# Patient Record
Sex: Female | Born: 1965 | Race: White | Hispanic: No | Marital: Married | Smoking: Never smoker
Health system: Southern US, Community
[De-identification: ages and names within clinical notes are randomized; demographics above are authoritative.]

## PROBLEM LIST (undated history)

## (undated) DIAGNOSIS — J349 Unspecified disorder of nose and nasal sinuses: Secondary | ICD-10-CM

## (undated) DIAGNOSIS — T4145XA Adverse effect of unspecified anesthetic, initial encounter: Secondary | ICD-10-CM

## (undated) DIAGNOSIS — I770 Arteriovenous fistula, acquired: Secondary | ICD-10-CM

## (undated) DIAGNOSIS — I82409 Acute embolism and thrombosis of unspecified deep veins of unspecified lower extremity: Secondary | ICD-10-CM

## (undated) DIAGNOSIS — H269 Unspecified cataract: Secondary | ICD-10-CM

## (undated) DIAGNOSIS — E11621 Type 2 diabetes mellitus with foot ulcer: Secondary | ICD-10-CM

## (undated) DIAGNOSIS — N186 End stage renal disease: Secondary | ICD-10-CM

## (undated) DIAGNOSIS — L97509 Non-pressure chronic ulcer of other part of unspecified foot with unspecified severity: Secondary | ICD-10-CM

## (undated) DIAGNOSIS — T8859XA Other complications of anesthesia, initial encounter: Secondary | ICD-10-CM

## (undated) DIAGNOSIS — T300 Burn of unspecified body region, unspecified degree: Secondary | ICD-10-CM

## (undated) DIAGNOSIS — M869 Osteomyelitis, unspecified: Secondary | ICD-10-CM

## (undated) DIAGNOSIS — B49 Unspecified mycosis: Secondary | ICD-10-CM

## (undated) DIAGNOSIS — E209 Hypoparathyroidism, unspecified: Secondary | ICD-10-CM

## (undated) DIAGNOSIS — R269 Unspecified abnormalities of gait and mobility: Secondary | ICD-10-CM

## (undated) DIAGNOSIS — R2689 Other abnormalities of gait and mobility: Secondary | ICD-10-CM

## (undated) DIAGNOSIS — R911 Solitary pulmonary nodule: Secondary | ICD-10-CM

## (undated) DIAGNOSIS — G709 Myoneural disorder, unspecified: Secondary | ICD-10-CM

## (undated) DIAGNOSIS — S61259A Open bite of unspecified finger without damage to nail, initial encounter: Secondary | ICD-10-CM

## (undated) DIAGNOSIS — E875 Hyperkalemia: Secondary | ICD-10-CM

## (undated) DIAGNOSIS — I959 Hypotension, unspecified: Secondary | ICD-10-CM

## (undated) DIAGNOSIS — E236 Other disorders of pituitary gland: Secondary | ICD-10-CM

## (undated) DIAGNOSIS — T7840XA Allergy, unspecified, initial encounter: Secondary | ICD-10-CM

## (undated) DIAGNOSIS — Z86010 Personal history of colonic polyps: Secondary | ICD-10-CM

## (undated) DIAGNOSIS — G629 Polyneuropathy, unspecified: Secondary | ICD-10-CM

## (undated) DIAGNOSIS — K219 Gastro-esophageal reflux disease without esophagitis: Secondary | ICD-10-CM

## (undated) DIAGNOSIS — S68119A Complete traumatic metacarpophalangeal amputation of unspecified finger, initial encounter: Secondary | ICD-10-CM

## (undated) HISTORY — PX: KNEE ARTHROSCOPY: SUR90

## (undated) HISTORY — DX: Gastro-esophageal reflux disease without esophagitis: K21.9

## (undated) HISTORY — PX: ARTERIOVENOUS GRAFT PLACEMENT: SUR1029

## (undated) HISTORY — DX: Type 2 diabetes mellitus with foot ulcer: E11.621

## (undated) HISTORY — DX: End stage renal disease: N18.6

## (undated) HISTORY — PX: BREAST SURGERY: SHX581

## (undated) HISTORY — DX: Polyneuropathy, unspecified: G62.9

## (undated) HISTORY — PX: OTHER SURGICAL HISTORY: SHX169

## (undated) HISTORY — DX: Solitary pulmonary nodule: R91.1

## (undated) HISTORY — DX: Open bite of unspecified finger without damage to nail, initial encounter: S61.259A

## (undated) HISTORY — DX: Unspecified cataract: H26.9

## (undated) HISTORY — DX: Unspecified disorder of nose and nasal sinuses: J34.9

## (undated) HISTORY — DX: Myoneural disorder, unspecified: G70.9

## (undated) HISTORY — PX: CATARACT EXTRACTION: SUR2

## (undated) HISTORY — DX: Personal history of colonic polyps: Z86.010

## (undated) HISTORY — DX: Hypoparathyroidism, unspecified: E20.9

## (undated) HISTORY — DX: Hyperkalemia: E87.5

## (undated) HISTORY — DX: Non-pressure chronic ulcer of other part of unspecified foot with unspecified severity: L97.509

## (undated) HISTORY — PX: TOE AMPUTATION: SHX809

## (undated) HISTORY — PX: AV FISTULA PLACEMENT: SHX1204

## (undated) HISTORY — DX: Osteomyelitis, unspecified: M86.9

## (undated) HISTORY — DX: Hypotension, unspecified: I95.9

## (undated) HISTORY — DX: Unspecified mycosis: B49

## (undated) HISTORY — DX: Allergy, unspecified, initial encounter: T78.40XA

## (undated) HISTORY — DX: Complete traumatic metacarpophalangeal amputation of unspecified finger, initial encounter: S68.119A

---

## 2003-02-02 ENCOUNTER — Inpatient Hospital Stay (HOSPITAL_COMMUNITY): Admission: AD | Admit: 2003-02-02 | Discharge: 2003-02-09 | Payer: Self-pay | Admitting: Internal Medicine

## 2003-02-02 ENCOUNTER — Encounter: Payer: Self-pay | Admitting: Internal Medicine

## 2003-02-06 ENCOUNTER — Encounter: Payer: Self-pay | Admitting: Internal Medicine

## 2003-02-22 ENCOUNTER — Inpatient Hospital Stay (HOSPITAL_COMMUNITY): Admission: EM | Admit: 2003-02-22 | Discharge: 2003-03-08 | Payer: Self-pay | Admitting: Emergency Medicine

## 2003-02-22 ENCOUNTER — Encounter: Payer: Self-pay | Admitting: Internal Medicine

## 2003-02-23 ENCOUNTER — Encounter (INDEPENDENT_AMBULATORY_CARE_PROVIDER_SITE_OTHER): Payer: Self-pay | Admitting: Specialist

## 2003-02-25 ENCOUNTER — Encounter (INDEPENDENT_AMBULATORY_CARE_PROVIDER_SITE_OTHER): Payer: Self-pay | Admitting: *Deleted

## 2003-02-25 ENCOUNTER — Encounter: Payer: Self-pay | Admitting: Internal Medicine

## 2003-02-26 ENCOUNTER — Encounter (INDEPENDENT_AMBULATORY_CARE_PROVIDER_SITE_OTHER): Payer: Self-pay | Admitting: *Deleted

## 2003-03-02 ENCOUNTER — Encounter (INDEPENDENT_AMBULATORY_CARE_PROVIDER_SITE_OTHER): Payer: Self-pay | Admitting: Cardiology

## 2003-03-03 ENCOUNTER — Encounter: Payer: Self-pay | Admitting: Internal Medicine

## 2003-03-06 ENCOUNTER — Encounter: Payer: Self-pay | Admitting: Internal Medicine

## 2003-03-20 ENCOUNTER — Ambulatory Visit (HOSPITAL_BASED_OUTPATIENT_CLINIC_OR_DEPARTMENT_OTHER): Admission: RE | Admit: 2003-03-20 | Discharge: 2003-03-20 | Payer: Self-pay | Admitting: Orthopaedic Surgery

## 2003-03-23 ENCOUNTER — Encounter (HOSPITAL_COMMUNITY): Admission: RE | Admit: 2003-03-23 | Discharge: 2003-06-21 | Payer: Self-pay | Admitting: Nephrology

## 2003-04-08 ENCOUNTER — Encounter: Payer: Self-pay | Admitting: Emergency Medicine

## 2003-04-08 ENCOUNTER — Inpatient Hospital Stay (HOSPITAL_COMMUNITY): Admission: EM | Admit: 2003-04-08 | Discharge: 2003-04-18 | Payer: Self-pay | Admitting: Emergency Medicine

## 2003-05-18 ENCOUNTER — Inpatient Hospital Stay (HOSPITAL_COMMUNITY): Admission: AD | Admit: 2003-05-18 | Discharge: 2003-05-21 | Payer: Self-pay | Admitting: Nephrology

## 2003-06-07 ENCOUNTER — Encounter: Payer: Self-pay | Admitting: Nephrology

## 2003-06-07 ENCOUNTER — Ambulatory Visit (HOSPITAL_COMMUNITY): Admission: RE | Admit: 2003-06-07 | Discharge: 2003-06-07 | Payer: Self-pay | Admitting: Nephrology

## 2003-07-18 ENCOUNTER — Encounter (HOSPITAL_BASED_OUTPATIENT_CLINIC_OR_DEPARTMENT_OTHER): Admission: RE | Admit: 2003-07-18 | Discharge: 2003-10-16 | Payer: Self-pay | Admitting: Internal Medicine

## 2003-08-14 ENCOUNTER — Ambulatory Visit (HOSPITAL_COMMUNITY): Admission: RE | Admit: 2003-08-14 | Discharge: 2003-08-14 | Payer: Self-pay | Admitting: Vascular Surgery

## 2003-11-19 ENCOUNTER — Encounter (HOSPITAL_BASED_OUTPATIENT_CLINIC_OR_DEPARTMENT_OTHER): Admission: RE | Admit: 2003-11-19 | Discharge: 2004-02-17 | Payer: Self-pay | Admitting: Internal Medicine

## 2004-01-03 ENCOUNTER — Other Ambulatory Visit: Admission: RE | Admit: 2004-01-03 | Discharge: 2004-01-03 | Payer: Self-pay | Admitting: Obstetrics and Gynecology

## 2004-02-28 ENCOUNTER — Encounter (INDEPENDENT_AMBULATORY_CARE_PROVIDER_SITE_OTHER): Payer: Self-pay | Admitting: Specialist

## 2004-02-28 ENCOUNTER — Ambulatory Visit (HOSPITAL_COMMUNITY): Admission: RE | Admit: 2004-02-28 | Discharge: 2004-02-28 | Payer: Self-pay | Admitting: Obstetrics and Gynecology

## 2004-03-04 ENCOUNTER — Encounter (HOSPITAL_BASED_OUTPATIENT_CLINIC_OR_DEPARTMENT_OTHER): Admission: RE | Admit: 2004-03-04 | Discharge: 2004-06-02 | Payer: Self-pay | Admitting: Internal Medicine

## 2004-05-12 ENCOUNTER — Inpatient Hospital Stay (HOSPITAL_COMMUNITY): Admission: AD | Admit: 2004-05-12 | Discharge: 2004-05-12 | Payer: Self-pay | Admitting: Family Medicine

## 2004-05-20 ENCOUNTER — Encounter (HOSPITAL_COMMUNITY): Admission: RE | Admit: 2004-05-20 | Discharge: 2004-05-30 | Payer: Self-pay | Admitting: Obstetrics and Gynecology

## 2004-05-20 ENCOUNTER — Inpatient Hospital Stay (HOSPITAL_COMMUNITY): Admission: AD | Admit: 2004-05-20 | Discharge: 2004-05-23 | Payer: Self-pay | Admitting: Internal Medicine

## 2004-06-11 ENCOUNTER — Encounter (HOSPITAL_BASED_OUTPATIENT_CLINIC_OR_DEPARTMENT_OTHER): Admission: RE | Admit: 2004-06-11 | Discharge: 2004-09-05 | Payer: Self-pay | Admitting: Internal Medicine

## 2004-08-17 ENCOUNTER — Emergency Department: Payer: Self-pay | Admitting: Emergency Medicine

## 2004-09-01 ENCOUNTER — Ambulatory Visit: Payer: Self-pay | Admitting: Family Medicine

## 2004-09-12 ENCOUNTER — Ambulatory Visit: Payer: Self-pay | Admitting: Infectious Diseases

## 2004-09-12 ENCOUNTER — Inpatient Hospital Stay (HOSPITAL_COMMUNITY): Admission: EM | Admit: 2004-09-12 | Discharge: 2004-09-20 | Payer: Self-pay | Admitting: Emergency Medicine

## 2004-09-14 ENCOUNTER — Encounter (INDEPENDENT_AMBULATORY_CARE_PROVIDER_SITE_OTHER): Payer: Self-pay | Admitting: *Deleted

## 2004-09-23 ENCOUNTER — Encounter (HOSPITAL_BASED_OUTPATIENT_CLINIC_OR_DEPARTMENT_OTHER): Admission: RE | Admit: 2004-09-23 | Discharge: 2004-11-21 | Payer: Self-pay | Admitting: Internal Medicine

## 2004-09-29 ENCOUNTER — Ambulatory Visit (HOSPITAL_COMMUNITY): Admission: RE | Admit: 2004-09-29 | Discharge: 2004-09-29 | Payer: Self-pay | Admitting: Nephrology

## 2004-10-09 ENCOUNTER — Ambulatory Visit (HOSPITAL_COMMUNITY): Admission: RE | Admit: 2004-10-09 | Discharge: 2004-10-09 | Payer: Self-pay | Admitting: Nephrology

## 2004-10-18 ENCOUNTER — Encounter (INDEPENDENT_AMBULATORY_CARE_PROVIDER_SITE_OTHER): Payer: Self-pay | Admitting: *Deleted

## 2004-10-18 ENCOUNTER — Inpatient Hospital Stay (HOSPITAL_COMMUNITY): Admission: AD | Admit: 2004-10-18 | Discharge: 2004-10-24 | Payer: Self-pay | Admitting: General Surgery

## 2004-11-18 ENCOUNTER — Ambulatory Visit: Payer: Self-pay

## 2004-12-20 ENCOUNTER — Emergency Department (HOSPITAL_COMMUNITY): Admission: EM | Admit: 2004-12-20 | Discharge: 2004-12-20 | Payer: Self-pay | Admitting: Emergency Medicine

## 2005-01-27 ENCOUNTER — Inpatient Hospital Stay (HOSPITAL_COMMUNITY): Admission: EM | Admit: 2005-01-27 | Discharge: 2005-02-02 | Payer: Self-pay | Admitting: Emergency Medicine

## 2005-03-20 ENCOUNTER — Ambulatory Visit: Payer: Self-pay | Admitting: Nephrology

## 2005-03-21 ENCOUNTER — Ambulatory Visit (HOSPITAL_COMMUNITY): Admission: RE | Admit: 2005-03-21 | Discharge: 2005-03-22 | Payer: Self-pay | Admitting: Vascular Surgery

## 2005-04-27 ENCOUNTER — Ambulatory Visit (HOSPITAL_COMMUNITY): Admission: RE | Admit: 2005-04-27 | Discharge: 2005-04-27 | Payer: Self-pay | Admitting: Vascular Surgery

## 2005-06-02 ENCOUNTER — Ambulatory Visit (HOSPITAL_COMMUNITY): Admission: RE | Admit: 2005-06-02 | Discharge: 2005-06-02 | Payer: Self-pay | Admitting: Vascular Surgery

## 2005-06-09 ENCOUNTER — Ambulatory Visit (HOSPITAL_COMMUNITY): Admission: RE | Admit: 2005-06-09 | Discharge: 2005-06-09 | Payer: Self-pay | Admitting: Vascular Surgery

## 2005-06-27 ENCOUNTER — Ambulatory Visit: Payer: Self-pay | Admitting: Nephrology

## 2005-06-28 ENCOUNTER — Observation Stay (HOSPITAL_COMMUNITY): Admission: EM | Admit: 2005-06-28 | Discharge: 2005-06-28 | Payer: Self-pay | Admitting: *Deleted

## 2005-07-13 ENCOUNTER — Ambulatory Visit: Payer: Self-pay | Admitting: Nephrology

## 2005-07-14 ENCOUNTER — Ambulatory Visit (HOSPITAL_COMMUNITY): Admission: RE | Admit: 2005-07-14 | Discharge: 2005-07-14 | Payer: Self-pay | Admitting: Vascular Surgery

## 2005-09-01 ENCOUNTER — Ambulatory Visit (HOSPITAL_COMMUNITY): Admission: RE | Admit: 2005-09-01 | Discharge: 2005-09-01 | Payer: Self-pay | Admitting: Nephrology

## 2005-09-14 ENCOUNTER — Ambulatory Visit: Payer: Self-pay | Admitting: Nephrology

## 2005-09-15 ENCOUNTER — Ambulatory Visit (HOSPITAL_COMMUNITY): Admission: RE | Admit: 2005-09-15 | Discharge: 2005-09-15 | Payer: Self-pay | Admitting: Vascular Surgery

## 2005-10-14 ENCOUNTER — Ambulatory Visit: Payer: Self-pay | Admitting: Nephrology

## 2005-10-15 ENCOUNTER — Ambulatory Visit (HOSPITAL_COMMUNITY): Admission: RE | Admit: 2005-10-15 | Discharge: 2005-10-15 | Payer: Self-pay | Admitting: Vascular Surgery

## 2005-10-29 ENCOUNTER — Ambulatory Visit (HOSPITAL_COMMUNITY): Admission: RE | Admit: 2005-10-29 | Discharge: 2005-10-29 | Payer: Self-pay | Admitting: Nephrology

## 2005-10-29 ENCOUNTER — Encounter (INDEPENDENT_AMBULATORY_CARE_PROVIDER_SITE_OTHER): Payer: Self-pay | Admitting: Cardiology

## 2005-12-01 ENCOUNTER — Inpatient Hospital Stay (HOSPITAL_COMMUNITY): Admission: EM | Admit: 2005-12-01 | Discharge: 2005-12-04 | Payer: Self-pay | Admitting: Emergency Medicine

## 2006-01-24 ENCOUNTER — Inpatient Hospital Stay (HOSPITAL_COMMUNITY): Admission: EM | Admit: 2006-01-24 | Discharge: 2006-01-29 | Payer: Self-pay | Admitting: Emergency Medicine

## 2006-01-28 ENCOUNTER — Encounter (INDEPENDENT_AMBULATORY_CARE_PROVIDER_SITE_OTHER): Payer: Self-pay | Admitting: *Deleted

## 2006-04-06 ENCOUNTER — Ambulatory Visit: Payer: Self-pay | Admitting: Nephrology

## 2006-04-06 ENCOUNTER — Emergency Department: Payer: Self-pay | Admitting: Emergency Medicine

## 2006-08-03 ENCOUNTER — Ambulatory Visit: Payer: Self-pay | Admitting: Nephrology

## 2006-10-27 ENCOUNTER — Encounter: Payer: Self-pay | Admitting: Nephrology

## 2006-11-01 ENCOUNTER — Encounter: Payer: Self-pay | Admitting: Nephrology

## 2006-11-06 ENCOUNTER — Inpatient Hospital Stay (HOSPITAL_COMMUNITY): Admission: EM | Admit: 2006-11-06 | Discharge: 2006-11-11 | Payer: Self-pay | Admitting: Emergency Medicine

## 2006-11-07 ENCOUNTER — Ambulatory Visit: Payer: Self-pay | Admitting: Vascular Surgery

## 2006-11-13 ENCOUNTER — Emergency Department: Payer: Self-pay | Admitting: Emergency Medicine

## 2006-11-23 ENCOUNTER — Ambulatory Visit: Payer: Self-pay | Admitting: Nephrology

## 2006-11-30 ENCOUNTER — Encounter: Payer: Self-pay | Admitting: Nephrology

## 2006-12-31 ENCOUNTER — Ambulatory Visit (HOSPITAL_COMMUNITY): Admission: RE | Admit: 2006-12-31 | Discharge: 2006-12-31 | Payer: Self-pay | Admitting: Vascular Surgery

## 2006-12-31 ENCOUNTER — Ambulatory Visit: Payer: Self-pay | Admitting: Vascular Surgery

## 2007-01-04 ENCOUNTER — Ambulatory Visit (HOSPITAL_COMMUNITY): Admission: RE | Admit: 2007-01-04 | Discharge: 2007-01-04 | Payer: Self-pay | Admitting: Nephrology

## 2007-01-05 ENCOUNTER — Ambulatory Visit (HOSPITAL_COMMUNITY): Admission: RE | Admit: 2007-01-05 | Discharge: 2007-01-05 | Payer: Self-pay | Admitting: Vascular Surgery

## 2007-01-07 ENCOUNTER — Encounter: Payer: Self-pay | Admitting: Nephrology

## 2007-02-02 ENCOUNTER — Encounter: Payer: Self-pay | Admitting: Nephrology

## 2007-02-03 ENCOUNTER — Ambulatory Visit: Payer: Self-pay | Admitting: Nephrology

## 2007-02-04 ENCOUNTER — Ambulatory Visit: Payer: Self-pay | Admitting: Vascular Surgery

## 2007-02-04 ENCOUNTER — Ambulatory Visit (HOSPITAL_COMMUNITY): Admission: RE | Admit: 2007-02-04 | Discharge: 2007-02-04 | Payer: Self-pay | Admitting: Vascular Surgery

## 2007-02-15 ENCOUNTER — Ambulatory Visit: Payer: Self-pay | Admitting: Nephrology

## 2007-02-16 ENCOUNTER — Ambulatory Visit (HOSPITAL_COMMUNITY): Admission: RE | Admit: 2007-02-16 | Discharge: 2007-02-16 | Payer: Self-pay | Admitting: Vascular Surgery

## 2007-02-22 ENCOUNTER — Ambulatory Visit: Payer: Self-pay | Admitting: Nephrology

## 2007-03-01 ENCOUNTER — Encounter: Payer: Self-pay | Admitting: Nephrology

## 2007-04-01 ENCOUNTER — Encounter: Payer: Self-pay | Admitting: Nephrology

## 2007-04-06 ENCOUNTER — Ambulatory Visit: Payer: Self-pay | Admitting: Vascular Surgery

## 2007-04-16 ENCOUNTER — Ambulatory Visit: Payer: Self-pay | Admitting: Cardiology

## 2007-04-16 ENCOUNTER — Inpatient Hospital Stay (HOSPITAL_COMMUNITY): Admission: EM | Admit: 2007-04-16 | Discharge: 2007-04-23 | Payer: Self-pay | Admitting: Emergency Medicine

## 2007-04-20 ENCOUNTER — Encounter (INDEPENDENT_AMBULATORY_CARE_PROVIDER_SITE_OTHER): Payer: Self-pay | Admitting: Nephrology

## 2007-05-02 ENCOUNTER — Encounter: Payer: Self-pay | Admitting: Nephrology

## 2007-07-05 ENCOUNTER — Ambulatory Visit: Payer: Self-pay | Admitting: Vascular Surgery

## 2007-08-01 ENCOUNTER — Ambulatory Visit: Payer: Self-pay | Admitting: Vascular Surgery

## 2007-08-03 ENCOUNTER — Ambulatory Visit: Payer: Self-pay | Admitting: Vascular Surgery

## 2007-08-25 ENCOUNTER — Inpatient Hospital Stay (HOSPITAL_COMMUNITY): Admission: EM | Admit: 2007-08-25 | Discharge: 2007-08-28 | Payer: Self-pay | Admitting: Emergency Medicine

## 2007-08-26 ENCOUNTER — Ambulatory Visit: Payer: Self-pay | Admitting: Vascular Surgery

## 2007-09-03 ENCOUNTER — Inpatient Hospital Stay (HOSPITAL_COMMUNITY): Admission: RE | Admit: 2007-09-03 | Discharge: 2007-09-10 | Payer: Self-pay | Admitting: Nephrology

## 2007-09-07 ENCOUNTER — Encounter (INDEPENDENT_AMBULATORY_CARE_PROVIDER_SITE_OTHER): Payer: Self-pay | Admitting: Nephrology

## 2007-09-07 ENCOUNTER — Encounter: Payer: Self-pay | Admitting: Surgery

## 2007-10-14 ENCOUNTER — Ambulatory Visit: Payer: Self-pay | Admitting: Vascular Surgery

## 2007-10-21 ENCOUNTER — Ambulatory Visit: Payer: Self-pay | Admitting: Vascular Surgery

## 2007-10-21 ENCOUNTER — Ambulatory Visit (HOSPITAL_COMMUNITY): Admission: RE | Admit: 2007-10-21 | Discharge: 2007-10-21 | Payer: Self-pay | Admitting: Vascular Surgery

## 2007-11-04 ENCOUNTER — Ambulatory Visit: Payer: Self-pay | Admitting: Vascular Surgery

## 2008-02-21 ENCOUNTER — Ambulatory Visit: Payer: Self-pay | Admitting: Nephrology

## 2008-06-18 ENCOUNTER — Encounter: Payer: Self-pay | Admitting: Internal Medicine

## 2008-06-18 ENCOUNTER — Ambulatory Visit: Payer: Self-pay | Admitting: Pain Medicine

## 2008-06-21 ENCOUNTER — Inpatient Hospital Stay (HOSPITAL_COMMUNITY): Admission: EM | Admit: 2008-06-21 | Discharge: 2008-06-30 | Payer: Self-pay | Admitting: Emergency Medicine

## 2008-06-22 ENCOUNTER — Ambulatory Visit: Payer: Self-pay | Admitting: Vascular Surgery

## 2008-06-23 ENCOUNTER — Encounter: Payer: Self-pay | Admitting: Vascular Surgery

## 2008-07-01 ENCOUNTER — Encounter: Payer: Self-pay | Admitting: Internal Medicine

## 2008-07-11 ENCOUNTER — Inpatient Hospital Stay (HOSPITAL_COMMUNITY): Admission: RE | Admit: 2008-07-11 | Discharge: 2008-07-13 | Payer: Self-pay | Admitting: Vascular Surgery

## 2008-07-20 ENCOUNTER — Ambulatory Visit (HOSPITAL_COMMUNITY): Admission: RE | Admit: 2008-07-20 | Discharge: 2008-07-20 | Payer: Self-pay | Admitting: Nephrology

## 2008-07-24 ENCOUNTER — Ambulatory Visit (HOSPITAL_COMMUNITY): Admission: RE | Admit: 2008-07-24 | Discharge: 2008-07-24 | Payer: Self-pay | Admitting: Nephrology

## 2008-08-13 ENCOUNTER — Ambulatory Visit: Payer: Self-pay | Admitting: Surgery

## 2008-08-27 ENCOUNTER — Ambulatory Visit (HOSPITAL_COMMUNITY): Admission: RE | Admit: 2008-08-27 | Discharge: 2008-08-27 | Payer: Self-pay | Admitting: Nephrology

## 2008-10-27 ENCOUNTER — Ambulatory Visit: Payer: Self-pay | Admitting: Nephrology

## 2008-12-05 ENCOUNTER — Encounter: Payer: Self-pay | Admitting: Nephrology

## 2008-12-25 IMAGING — CR DG CHEST 1V PORT
1 series · 1 of 1 positions shown · non-contrast
Comparison: 11/05/2004

CLINICAL DATA: Renal failure, right Diatek insertion.   
 PORTABLE CHEST - 1 VIEW AT 5892 HOURS:

[view not recorded]
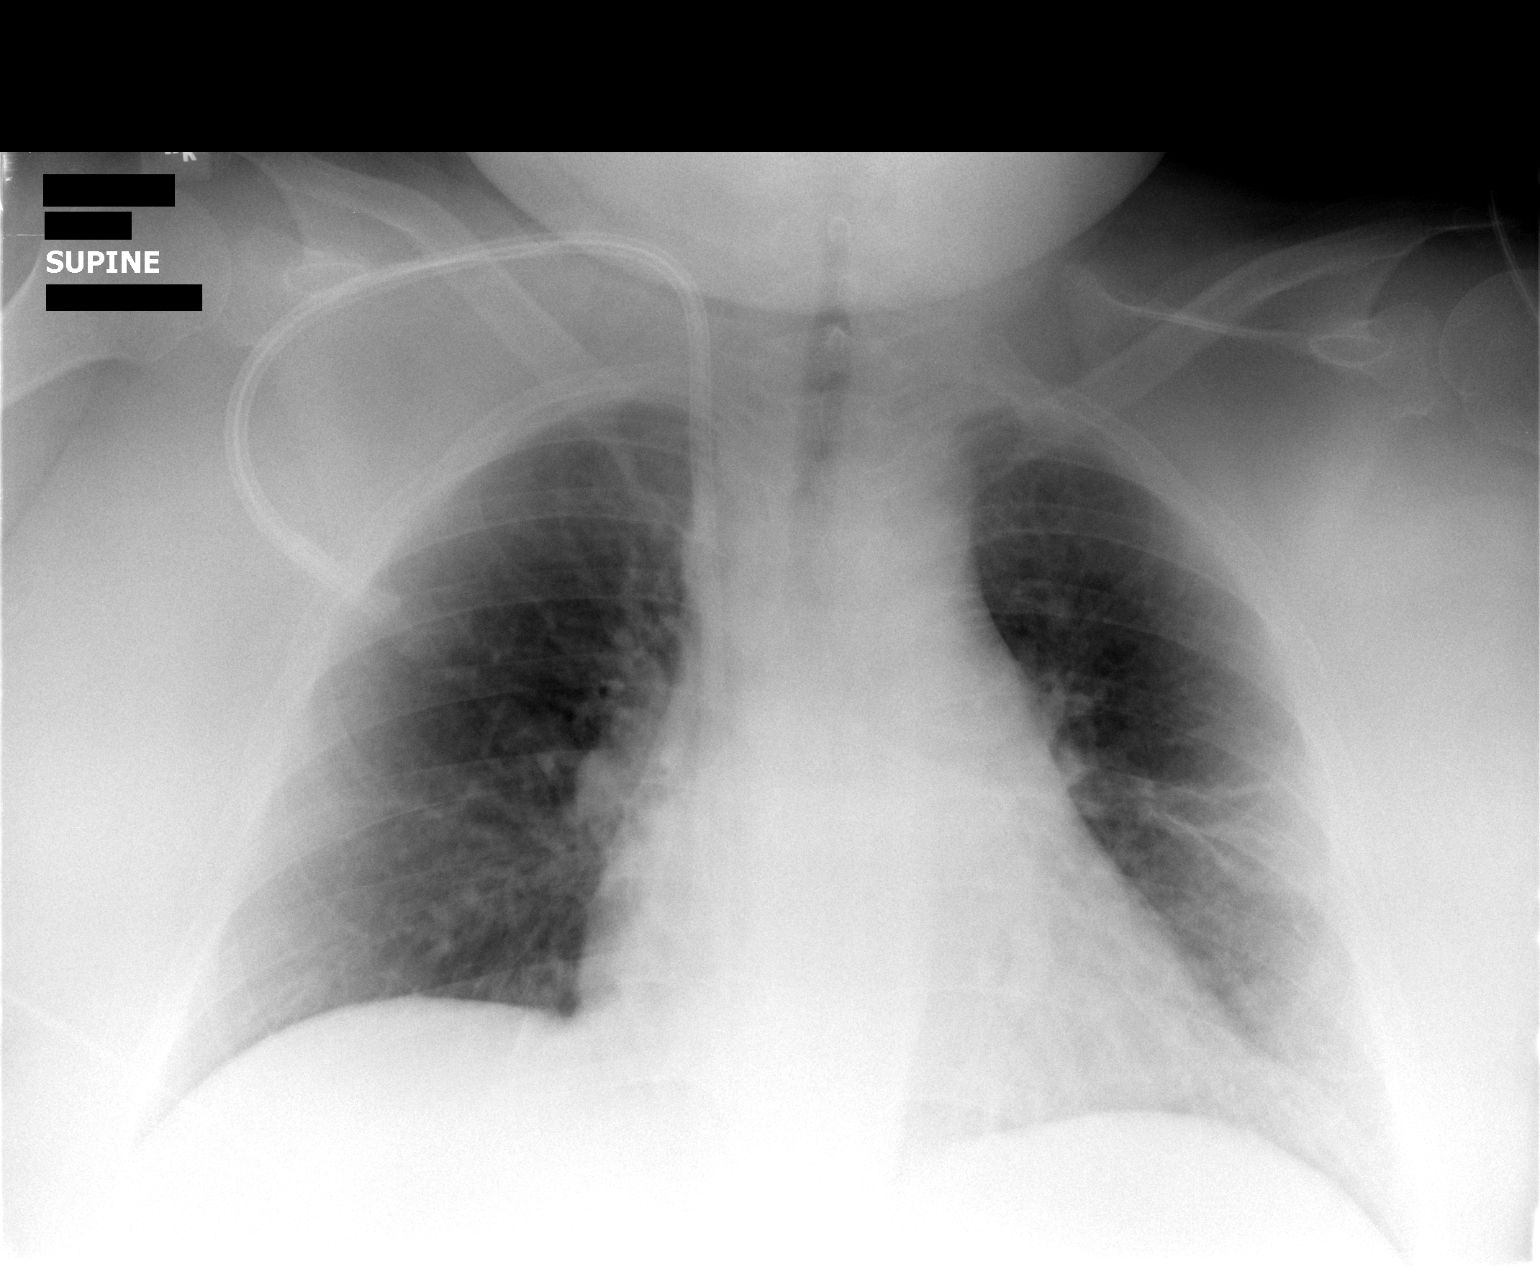

[1 of 1 positions shown; findings below may reference images not displayed]

FINDINGS: Diatek catheter has been inserted via right jugular vein approach.  Tips of the catheter are in the right atrium.  No pneumothorax.  Linear atelectasis left mid-lung zone.  Pulmonary vascular congestion without edema.
IMPRESSION: Specifically, the Diatek catheter is in the right atrium. No pneumothorax.

## 2008-12-29 ENCOUNTER — Encounter: Payer: Self-pay | Admitting: Nephrology

## 2008-12-30 IMAGING — CT CT HEAD WITHOUT CONTRAST
2 series · 16 of 30 positions shown, 20 images · non-contrast
Comparison: None available.

REASON FOR EXAM: Ringing in ears, head feels funny
COMMENTS:

PROCEDURE:     CT  - CT HEAD WITHOUT CONTRAST  - November 13, 2006 [DATE]
RESULT:     The patient is experiencing ringing in ears and head feels funny.
TECHNIQUE: Contiguous axial CT images from the vertex to the skull base
without contrast.

[Series 2: without · axial · non-contrast · 0.44mm/px · z∈[+1079,+1204]mm · 13 of 31 slices shown, 17 images]
[im 3/31  brain]
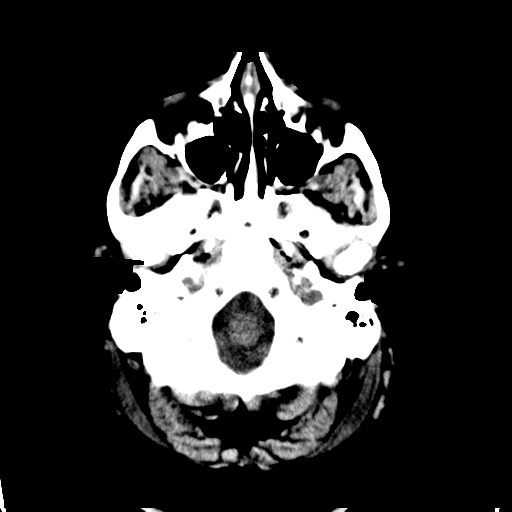
[im 3/31  bone]
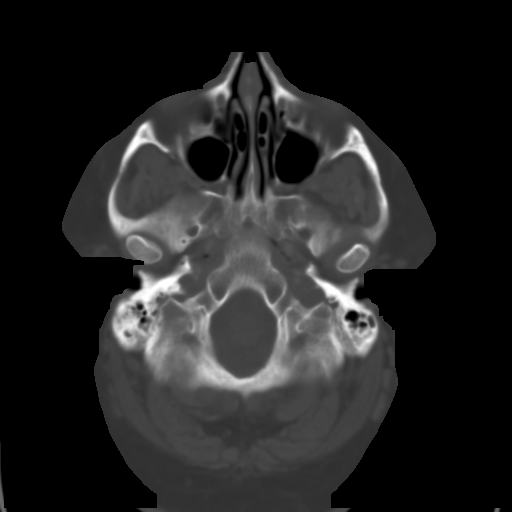
[im 5/31  brain]
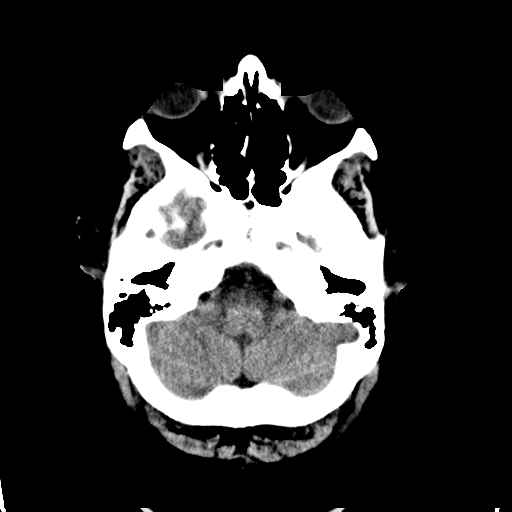
[im 7/31  brain]
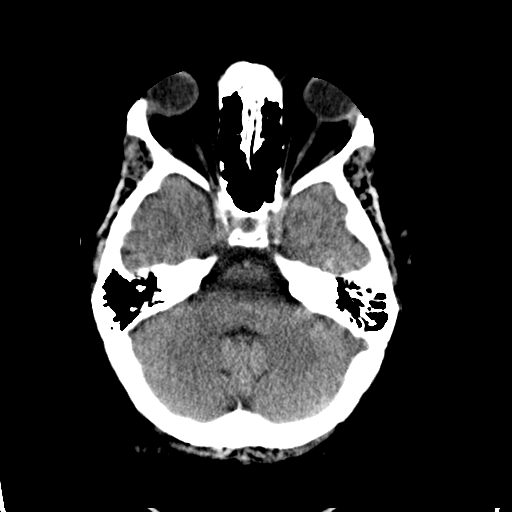
[im 9/31  brain]
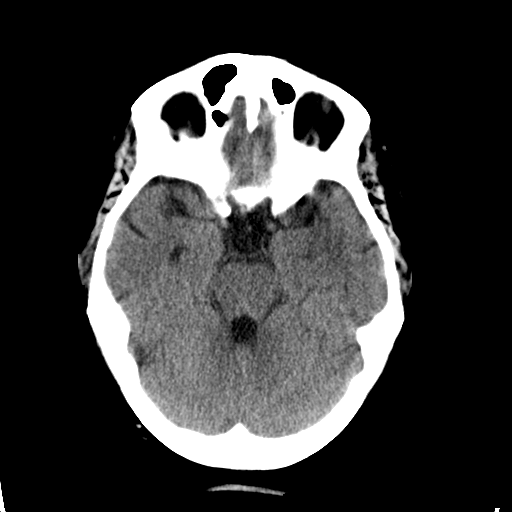
[im 11/31  brain]
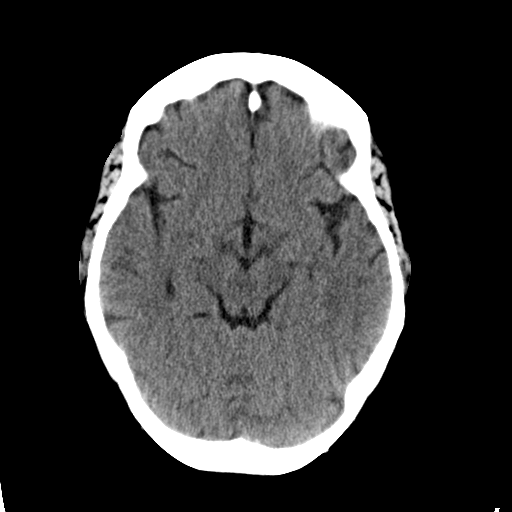
[im 11/31  bone]
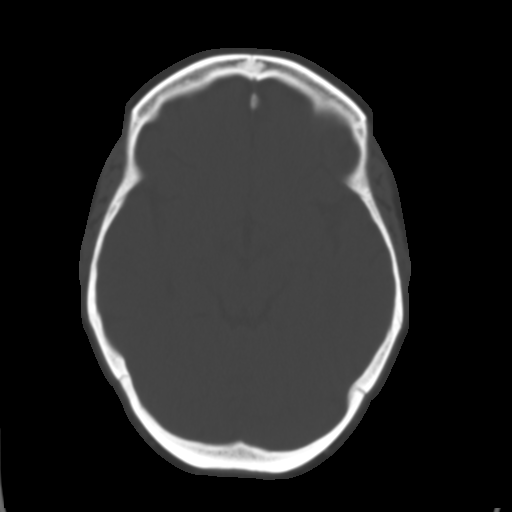
[im 13/31  brain]
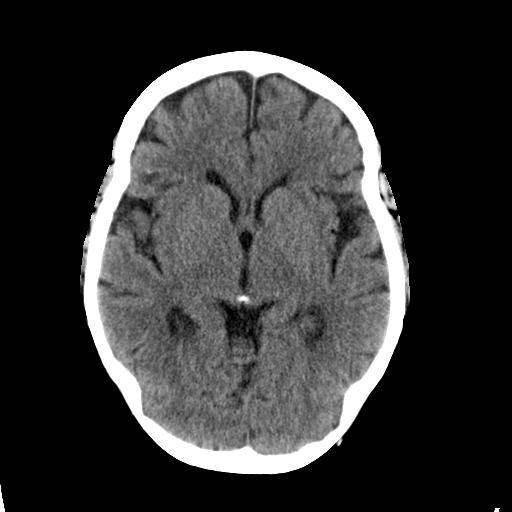
[im 16/31  brain]
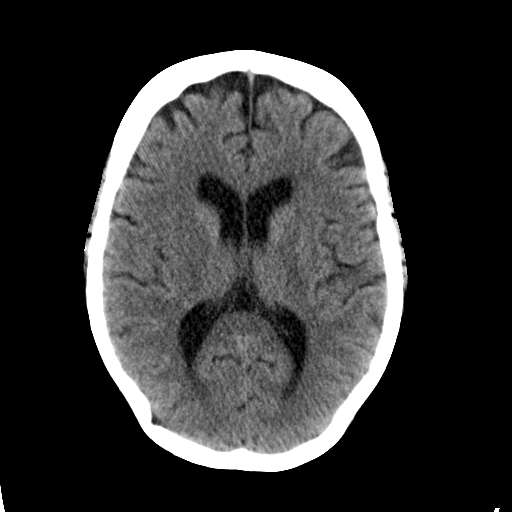
[im 18/31  brain]
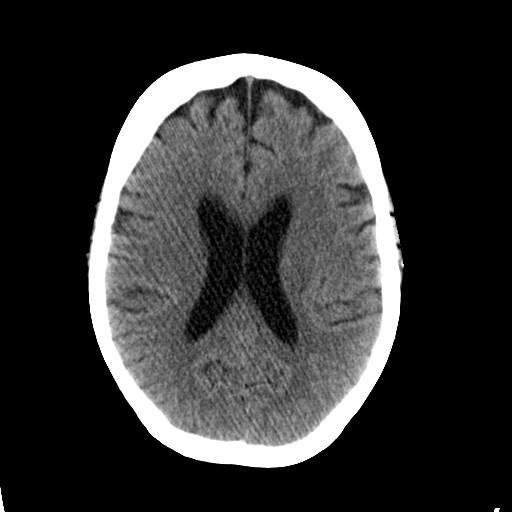
[im 20/31  brain]
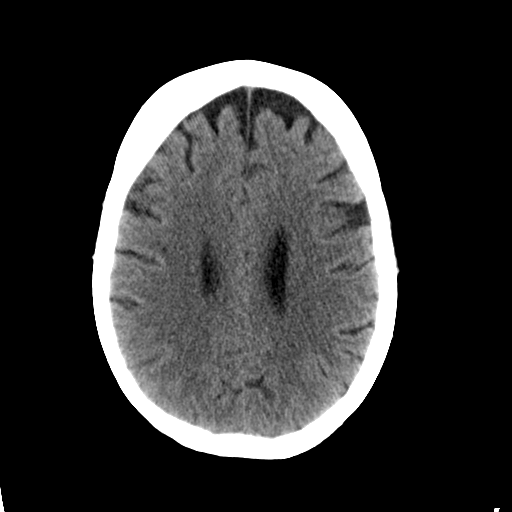
[im 20/31  bone]
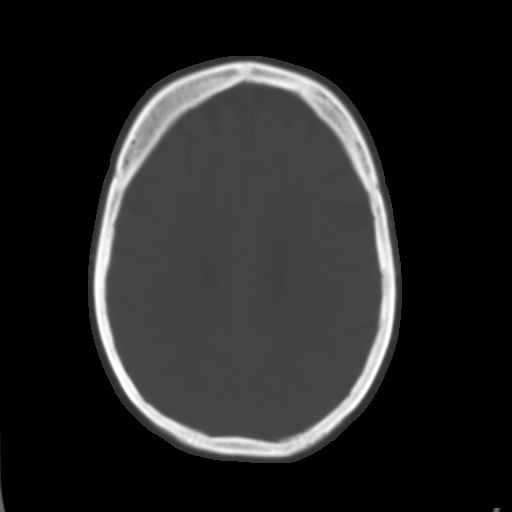
[im 22/31  brain]
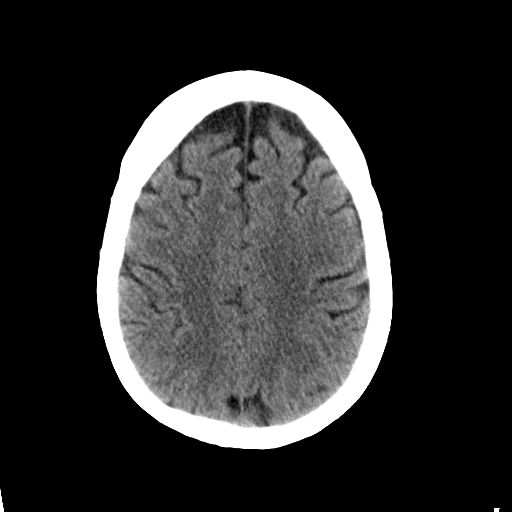
[im 24/31  brain]
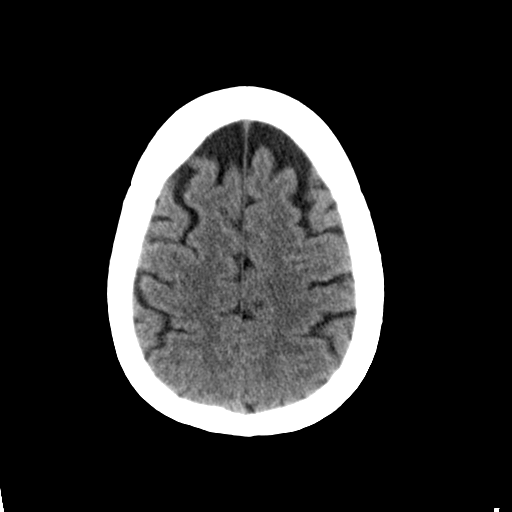
[im 26/31  brain]
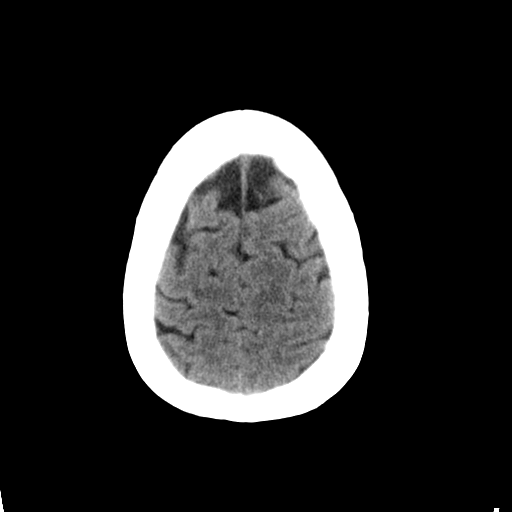
[im 28/31  brain]
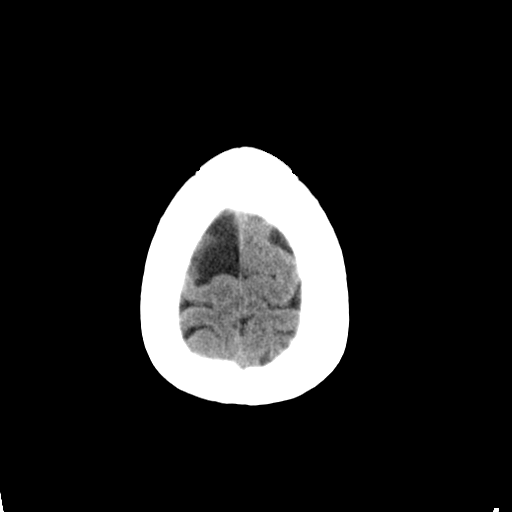
[im 28/31  bone]
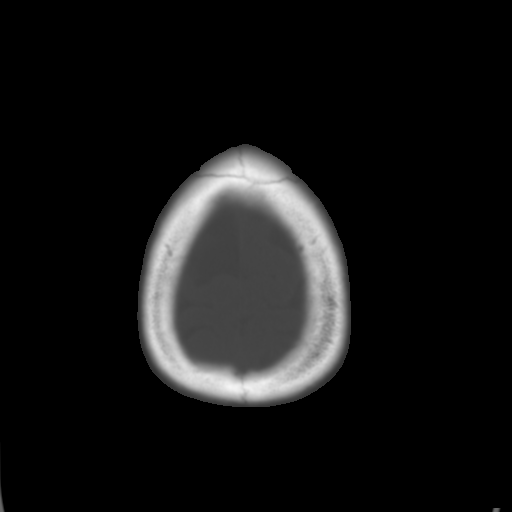

[Series 3: bone · axial · 0.44mm/px · z∈[+1079,+1119]mm · 3 of 31 slices shown]
[im 3/31  bone]
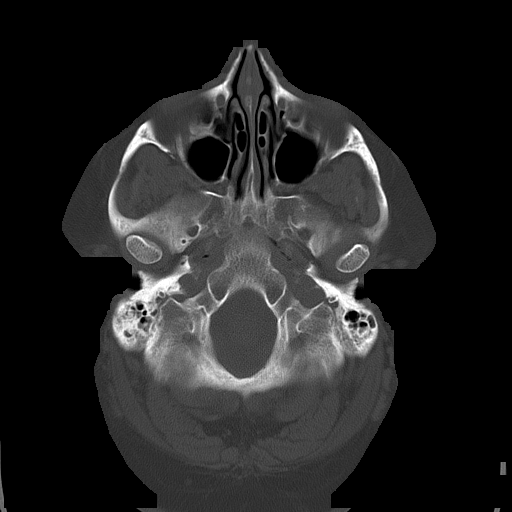
[im 7/31  bone]
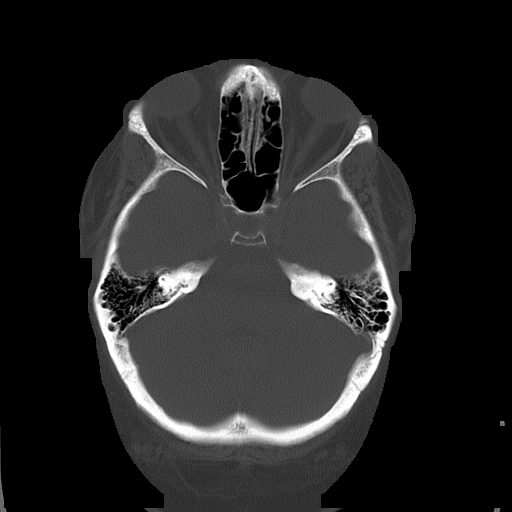
[im 11/31  bone]
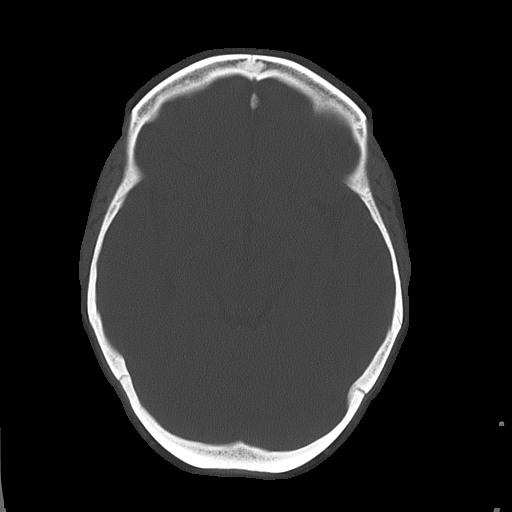

[16 of 30 positions shown; findings below may reference images not displayed]

FINDINGS: In general, there is mild prominence of the cisterna and sulcal
pattern consistent with mild generalized atrophy. This is slightly more
pronounced in the frontal lobes. However, no midline shift or mass is
identified. There is no intra-axial or extra-axial blood products
identified. No edema is visualized. The mastoid and paranasal sinuses are
clear. The visualized osseous structures and soft tissue structures
demonstrate no acute abnormality.
IMPRESSION: Negative noncontrast CT for acute abnormality.

## 2009-01-15 ENCOUNTER — Ambulatory Visit: Payer: Self-pay | Admitting: Nephrology

## 2009-01-17 ENCOUNTER — Ambulatory Visit: Payer: Self-pay | Admitting: Nephrology

## 2009-01-29 ENCOUNTER — Encounter: Payer: Self-pay | Admitting: Nephrology

## 2009-02-28 ENCOUNTER — Encounter: Payer: Self-pay | Admitting: Nephrology

## 2009-03-31 ENCOUNTER — Encounter: Payer: Self-pay | Admitting: Nephrology

## 2009-04-22 ENCOUNTER — Ambulatory Visit (HOSPITAL_COMMUNITY): Admission: RE | Admit: 2009-04-22 | Discharge: 2009-04-22 | Payer: Self-pay | Admitting: Ophthalmology

## 2009-05-01 ENCOUNTER — Encounter: Payer: Self-pay | Admitting: Nephrology

## 2009-05-10 ENCOUNTER — Ambulatory Visit (HOSPITAL_COMMUNITY): Admission: RE | Admit: 2009-05-10 | Discharge: 2009-05-10 | Payer: Self-pay | Admitting: Nephrology

## 2009-05-17 ENCOUNTER — Ambulatory Visit (HOSPITAL_COMMUNITY): Admission: RE | Admit: 2009-05-17 | Discharge: 2009-05-17 | Payer: Self-pay | Admitting: Nephrology

## 2009-05-31 ENCOUNTER — Encounter: Payer: Self-pay | Admitting: Nephrology

## 2009-05-31 DIAGNOSIS — I82409 Acute embolism and thrombosis of unspecified deep veins of unspecified lower extremity: Secondary | ICD-10-CM

## 2009-05-31 HISTORY — DX: Acute embolism and thrombosis of unspecified deep veins of unspecified lower extremity: I82.409

## 2009-06-20 ENCOUNTER — Encounter: Payer: Self-pay | Admitting: Family Medicine

## 2009-07-01 ENCOUNTER — Encounter: Payer: Self-pay | Admitting: Nephrology

## 2009-07-04 ENCOUNTER — Encounter: Payer: Self-pay | Admitting: Family Medicine

## 2009-07-24 ENCOUNTER — Ambulatory Visit: Payer: Self-pay | Admitting: Family Medicine

## 2009-07-24 ENCOUNTER — Encounter: Payer: Self-pay | Admitting: Family Medicine

## 2009-07-24 DIAGNOSIS — J984 Other disorders of lung: Secondary | ICD-10-CM | POA: Insufficient documentation

## 2009-07-24 DIAGNOSIS — E1139 Type 2 diabetes mellitus with other diabetic ophthalmic complication: Secondary | ICD-10-CM | POA: Insufficient documentation

## 2009-07-24 DIAGNOSIS — N2581 Secondary hyperparathyroidism of renal origin: Secondary | ICD-10-CM

## 2009-07-24 DIAGNOSIS — N19 Unspecified kidney failure: Secondary | ICD-10-CM | POA: Insufficient documentation

## 2009-07-24 DIAGNOSIS — E119 Type 2 diabetes mellitus without complications: Secondary | ICD-10-CM

## 2009-07-24 DIAGNOSIS — E0840 Diabetes mellitus due to underlying condition with diabetic neuropathy, unspecified: Secondary | ICD-10-CM | POA: Insufficient documentation

## 2009-07-24 DIAGNOSIS — F4323 Adjustment disorder with mixed anxiety and depressed mood: Secondary | ICD-10-CM

## 2009-08-07 ENCOUNTER — Emergency Department (HOSPITAL_COMMUNITY): Admission: EM | Admit: 2009-08-07 | Discharge: 2009-08-07 | Payer: Self-pay | Admitting: Emergency Medicine

## 2009-08-07 ENCOUNTER — Ambulatory Visit: Payer: Self-pay | Admitting: Family Medicine

## 2009-08-07 ENCOUNTER — Ambulatory Visit (HOSPITAL_COMMUNITY): Admission: RE | Admit: 2009-08-07 | Discharge: 2009-08-07 | Payer: Self-pay | Admitting: Nephrology

## 2009-08-07 DIAGNOSIS — L0291 Cutaneous abscess, unspecified: Secondary | ICD-10-CM | POA: Insufficient documentation

## 2009-08-07 DIAGNOSIS — L0390 Cellulitis, unspecified: Secondary | ICD-10-CM

## 2009-08-09 ENCOUNTER — Ambulatory Visit: Payer: Self-pay | Admitting: Family Medicine

## 2009-08-09 ENCOUNTER — Other Ambulatory Visit: Admission: RE | Admit: 2009-08-09 | Discharge: 2009-08-09 | Payer: Self-pay | Admitting: Family Medicine

## 2009-08-20 ENCOUNTER — Encounter: Payer: Self-pay | Admitting: Family Medicine

## 2009-09-04 ENCOUNTER — Ambulatory Visit: Payer: Self-pay | Admitting: Family Medicine

## 2009-09-04 ENCOUNTER — Encounter: Payer: Self-pay | Admitting: Family Medicine

## 2009-09-16 ENCOUNTER — Ambulatory Visit: Payer: Self-pay | Admitting: Family Medicine

## 2009-10-15 ENCOUNTER — Telehealth: Payer: Self-pay | Admitting: Family Medicine

## 2009-10-20 IMAGING — US IR US GUIDE VASC ACCESS RIGHT
1 series · 2 of 2 positions shown · non-contrast
Comparison: none

CLINICAL DATA: 41-year-old with recently placed right upper extremity graft that has clotted. 
DECLOT OF RIGHT UPPER EXTREMITY GRAFT:
ULTRASOUND GUIDANCE FOR VASCULAR ACCESS: 
GRAFT PTA: 
Medications:  Heparin 8111 units, TPA 2 mg, Versed 1 mg, fentanyl 50 mcg.

[Series 1: ir us guide vasc access right · 2 of 2 slices shown]
[im 1/2]
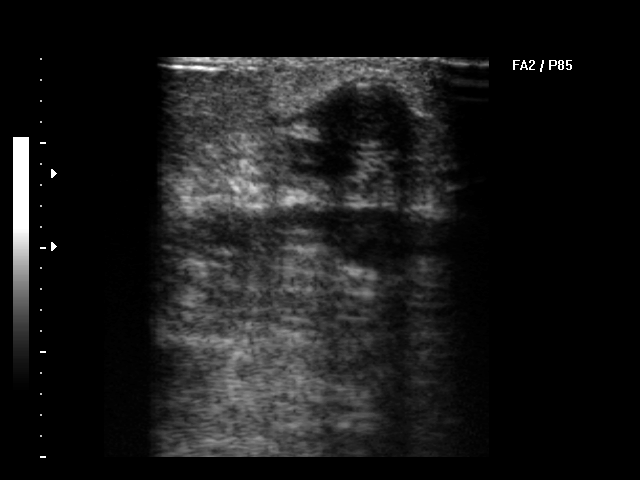
[im 2/2]
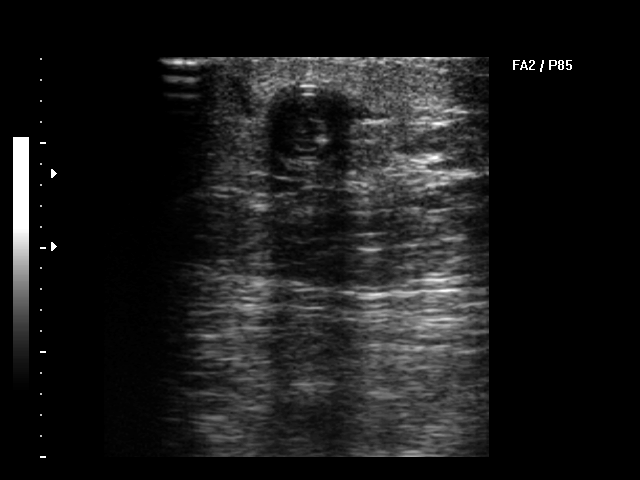

[2 of 2 positions shown; findings below may reference images not displayed]

IMPRESSION: Successful declot of right upper extremity graft.

## 2009-12-04 ENCOUNTER — Ambulatory Visit: Payer: Self-pay | Admitting: Nephrology

## 2009-12-04 ENCOUNTER — Inpatient Hospital Stay (HOSPITAL_COMMUNITY): Admission: RE | Admit: 2009-12-04 | Discharge: 2009-12-04 | Payer: Self-pay | Admitting: Nephrology

## 2009-12-05 ENCOUNTER — Ambulatory Visit (HOSPITAL_COMMUNITY): Admission: RE | Admit: 2009-12-05 | Discharge: 2009-12-05 | Payer: Self-pay | Admitting: Nephrology

## 2009-12-23 ENCOUNTER — Ambulatory Visit (HOSPITAL_COMMUNITY): Admission: RE | Admit: 2009-12-23 | Discharge: 2009-12-23 | Payer: Self-pay | Admitting: Nephrology

## 2010-01-15 ENCOUNTER — Telehealth: Payer: Self-pay | Admitting: Family Medicine

## 2010-01-16 ENCOUNTER — Ambulatory Visit: Payer: Self-pay | Admitting: Vascular Surgery

## 2010-01-16 ENCOUNTER — Encounter: Payer: Self-pay | Admitting: Family Medicine

## 2010-02-06 ENCOUNTER — Telehealth: Payer: Self-pay | Admitting: Family Medicine

## 2010-02-18 ENCOUNTER — Ambulatory Visit (HOSPITAL_COMMUNITY): Admission: RE | Admit: 2010-02-18 | Discharge: 2010-02-18 | Payer: Self-pay | Admitting: Vascular Surgery

## 2010-02-18 ENCOUNTER — Ambulatory Visit: Payer: Self-pay | Admitting: Vascular Surgery

## 2010-03-27 ENCOUNTER — Ambulatory Visit: Payer: Self-pay | Admitting: Cardiovascular Disease

## 2010-03-27 DIAGNOSIS — R42 Dizziness and giddiness: Secondary | ICD-10-CM

## 2010-03-27 DIAGNOSIS — I959 Hypotension, unspecified: Secondary | ICD-10-CM | POA: Insufficient documentation

## 2010-04-10 ENCOUNTER — Ambulatory Visit: Payer: Self-pay | Admitting: Vascular Surgery

## 2010-04-24 ENCOUNTER — Ambulatory Visit: Payer: Self-pay | Admitting: Vascular Surgery

## 2010-04-24 ENCOUNTER — Ambulatory Visit (HOSPITAL_COMMUNITY): Admission: RE | Admit: 2010-04-24 | Discharge: 2010-04-24 | Payer: Self-pay | Admitting: Vascular Surgery

## 2010-05-01 ENCOUNTER — Ambulatory Visit: Payer: Self-pay | Admitting: Vascular Surgery

## 2010-06-25 ENCOUNTER — Ambulatory Visit: Payer: Self-pay | Admitting: Vascular Surgery

## 2010-09-21 ENCOUNTER — Encounter: Payer: Self-pay | Admitting: Nephrology

## 2010-09-30 NOTE — Assessment & Plan Note (Signed)
Summary: FILL OUT FORM FOR DMV/CLE   Vital Signs:  Patient profile:   45 year old female Height:      69 inches Weight:      279.13 pounds BMI:     41.37 Temp:     98.4 degrees F oral Pulse rate:   84 / minute Pulse rhythm:   regular BP sitting:   110 / 68  (left arm) Cuff size:   large  Vitals Entered By: Delilah Shan CMA Duncan Dull) (September 16, 2009 8:32 AM) CC: Form for DMV   History of Present Illness: 45 yo with DM, ESRD, periperal neuropathy here to have DMV forms filled out. Doing well, no complaints. Has appt with eye doctor to fill out vision part of the form. No recent episodes of hypoglycemia, she is aware of what it feels like.  Current Medications (verified): 1)  Renal Multivitamin/zinc  Tabs (Multiple Vitamin) .... Take 1 Tablet By Mouth Once A Day 2)  Midodrine Hcl 10 Mg Tabs (Midodrine Hcl) .... Take 1 Tablet By Mouth Once A Day 3)  Omeprazole 40 Mg Cpdr (Omeprazole) .... Take 1 Tablet By Mouth Once A Day 4)  Lyrica 50 Mg Caps (Pregabalin) .... Take 1 Capsule By Mouth Tid Times A Day 5)  Phoslo 667 Mg Caps (Calcium Acetate (Phos Binder)) .... Take 4 Capsules Three Times A Day Before Meals. 6)  Sensipar 60 Mg Tabs (Cinacalcet Hcl) .... Take 1 Tablet By Mouth Once A Day 7)  Novolin 70/30 70-30 % Susp (Insulin Isophane & Regular) .... 60 Units At Bedtime  Allergies: 1)  ! Wellbutrin (Bupropion Hcl) 2)  ! Morphine  Review of Systems      See HPI  Physical Exam  General:  obese, NAD, Mouth:  Oral mucosa and oropharynx without lesions or exudates.  Teeth in good repair. Lungs:  Normal respiratory effort, chest expands symmetrically. Lungs are clear to auscultation, no crackles or wheezes. Heart:  systolic click.   RR Abdomen:  obese, soft, NT Extremities:  No edema Psych:  memory intact for recent and remote, normally interactive, good eye contact.   Impression & Recommendations:  Problem # 1:  OTH GENERAL MEDICAL EXAMINATION ADMIN PURPOSES  (ICD-V70.3) Paperwork filled out and given back to patient.  Complete Medication List: 1)  Renal Multivitamin/zinc Tabs (Multiple vitamin) .... Take 1 tablet by mouth once a day 2)  Midodrine Hcl 10 Mg Tabs (Midodrine hcl) .... Take 1 tablet by mouth once a day 3)  Omeprazole 40 Mg Cpdr (Omeprazole) .... Take 1 tablet by mouth once a day 4)  Lyrica 50 Mg Caps (Pregabalin) .... Take 1 capsule by mouth tid times a day 5)  Phoslo 667 Mg Caps (Calcium acetate (phos binder)) .... Take 4 capsules three times a day before meals. 6)  Sensipar 60 Mg Tabs (Cinacalcet hcl) .... Take 1 tablet by mouth once a day 7)  Novolin 70/30 70-30 % Susp (Insulin isophane & regular) .... 60 units at bedtime  Current Allergies (reviewed today): ! WELLBUTRIN (BUPROPION HCL) ! MORPHINE

## 2010-09-30 NOTE — Assessment & Plan Note (Signed)
Summary: NEW PT   Visit Type:  Initial Consult Primary Provider:  Ruthe Mannan MD  CC:  Decrease BP @ night..  History of Present Illness: 45 year old woman with a history of end-stage renal disease, on dialysis 3 times per week, echocardiogram in 2009 showing normal systolic function, cardiac catheterization in 2007 showing no significant coronary artery disease with obstructive sleep apnea, currently not on CPAP, diabetes that is poorly controlled, anemia, depression, history of remote chest discomfort, who presents for dizziness at nighttime.  She reports that over the past several months, she has had approximately 10 episodes where she has woken up in the middle of the night and felt a funny full feeling in her ears. She can not hear anything in it makes her sit up and try to wake herself up. She has the same feeling when her blood pressure runs low during dialysis. She does not have any blood pressure or heart rate numbers when these episodes happen at night time. They are infrequent, not associated with dialysis or other medications. She denies any significant chest pain, lightheadedness with her routine activities. Her weight has been relatively stable. She does have weekends when she eats and drinks too much and she has had significant diuresis on dialysis.  EKG shows normal sinus rhythm with rate 65 beats per minute, nonspecific T-wave changes in the lateral leads, prolonged QT  Current Medications (verified): 1)  Renal Multivitamin/zinc  Tabs (Multiple Vitamin) .... Take 1 Tablet By Mouth Once A Day 2)  Midodrine Hcl 10 Mg Tabs (Midodrine Hcl) .... Take 1 Tablet By Mouth Once A Day 3)  Omeprazole 40 Mg Cpdr (Omeprazole) .... Take 1 Tablet By Mouth Once A Day 4)  Phoslo 667 Mg Caps (Calcium Acetate (Phos Binder)) .... Take 4 Capsules Three Times A Day Before Meals. 5)  Sensipar 60 Mg Tabs (Cinacalcet Hcl) .... Take 1 Tablet By Mouth Once A Day 6)  Novolin 70/30 70-30 % Susp (Insulin  Isophane & Regular) .... 60 Units At Bedtime 7)  Neurontin 100 Mg Caps (Gabapentin) .... Take One By Mouth Two Times A Day  Allergies (verified): 1)  ! Wellbutrin (Bupropion Hcl) 2)  ! 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 Park Breed.  Past Surgical History: Last updated: 07/24/2009 Multiple graft and fistula surgeries Cataracts surgery Toe amputation- right foot  Family History: Last updated: 07/24/2009 Mom and dad both alive, both have HTN  Social History: Last updated: 07/24/2009 Lives with husband in [place].  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 Never Smoked Alcohol use-no  Risk Factors: Smoking Status: never (07/24/2009)  Review of Systems  The patient denies fever, weight loss, weight gain, vision loss, decreased hearing, hoarseness, chest pain, syncope, dyspnea on exertion, peripheral edema, prolonged cough, abdominal pain, incontinence, muscle weakness, depression, and enlarged lymph nodes.         Dizzy epsiodes at night  Vital Signs:  Patient profile:   45 year old female Height:      69 inches Weight:      269 pounds BMI:     39.87 Pulse rate:   67 / minute BP sitting:   97 / 62  (left arm) Cuff size:   large  Vitals Entered By: Bishop Dublin, CMA (March 27, 2010 11:28 AM)  Physical Exam  General:  Well developed, well nourished, in no acute distress. Head:  normocephalic and atraumatic Neck:  Neck supple, no JVD. No masses, thyromegaly or abnormal cervical nodes. Lungs:  Clear bilaterally to auscultation and percussion. Heart:  Non-displaced PMI, chest non-tender; regular rate and rhythm, S1, S2 with I-II SEM RSB, no rubs or gallops. Carotid upstroke  normal, no bruit.  Pedals normal pulses. No edema, no varicosities. Abdomen:  Bowel sounds positive; abdomen soft and non-tender without masses Msk:  Back normal, normal gait. Muscle strength and tone normal. Pulses:  pulses normal in all 4 extremities Extremities:  No clubbing or cyanosis. Neurologic:  Alert and oriented x 3. Skin:  Intact without lesions or rashes. Psych:  Normal affect.   Impression & Recommendations:  Problem # 1:  DIZZINESS (ICD-780.4)  the etiology of her symptoms at nighttime are not particularly clear. She has a fullness in her ears that wakes her from sleep. , hypotension should not wake someone in a supine position. She reports that she feels better when she wakes up and shake her arms around and wakes herself up. Typically this would make hypotension worse.  I am concerned about sleep apnea. She has a diagnosis of sleep apnea and is not on CPAP. She had difficulty with the high pressure to her nose.  I suggested to her that she purchase a blood pressure cuff and measure her heart rates and blood pressure when she has these episodes of tearfulness and dizziness.  It also certainly could be an underlying arrhythmia she is able to provide [place] with heart rate numbers when she has these episodes, this would be useful.  After she has collected some blood pressure and heart rate numbers, have asked her to contact us. We can proceed from there with either retrying her CPAP with different settings or possibly an event monitor. If it is a hypotensive episode, we may have to increase her midodrine particularly before sleep.  Problem # 2:  DIABETES MELLITUS, TYPE II (ICD-250.00) We have talked her about her diet and her elevated sugars. She is interested in a LAP-BAND or gastric bypass though she is nervous about proceeding with it.  Her updated medication list for this problem includes:    Novolin 70/30 70-30 % Susp (Insulin isophane & regular) .Marland KitchenMarland KitchenMarland KitchenMarland Kitchen 60 units at  bedtime  Other Orders: EKG w/ Interpretation (93000)  Patient Instructions: 1)  Your physician has requested that you regularly monitor and record your blood pressure readings at home.  Please use the same machine at the same time of day to check your readings AND CALL us WITH THE RESULTS.

## 2010-09-30 NOTE — Medication Information (Signed)
Summary: Approval for Additional Quantity Omeprazole/[facility]  Approval for Additional Quantity Omeprazole/[facility]   Imported By: Lanelle Bal 01/23/2010 08:55:10  _____________________________________________________________________  External Attachment:    Type:   Image     Comment:   External Document

## 2010-09-30 NOTE — Progress Notes (Signed)
Summary: prior Berkley Harvey is needed for omeprazole  Phone Note From Pharmacy   Caller: [facility] [address] #[id number]*/ [facility] Summary of Call: Prior Berkley Harvey is needed for omeprazole.  Insurance only wants to cover 90 in a 6 month period.  Form is on your desk. Initial call taken by: Lowella Petties CMA,  Jan 15, 2010 4:14 PM  Follow-up for Phone Call        in my box. Ruthe Mannan MD  Jan 16, 2010 7:39 AM      Appended Document: prior Berkley Harvey is needed for omeprazole Faxed to [facility] at [phone].

## 2010-09-30 NOTE — Progress Notes (Signed)
Summary: needs refill on neurontin  Phone Note Refill Request Call back at Home Phone [phone] Message from:  Patient  Refills Requested: Medication #1:  Neurontin 100 mg's twice a day Pt states her nephrologist took her off lyrica and put her on neurontin about a month ago.  She is out of this today and needs a refill.  She cant get in touch with her nephrologist.  [facility] [facility] [address]  Initial call taken by: Lowella Petties CMA,  February 06, 2010 12:08 PM  Follow-up for Phone Call        please ask for dose she is on, ok to send it in. Ruthe Mannan MD  February 06, 2010 12:29 PM   Additional Follow-up for Phone Call Additional follow up Details #1::        She is on 100 mg's  twice a day.  Send in 32 with any refills?                 Lowella Petties CMA  February 06, 2010 12:38 PM  Called to [facility] [address], emr updated.       Lowella Petties CMA  February 06, 2010 12:54 PM     Additional Follow-up for Phone Call Additional follow up Details #2::    60 with 3 refills. Ruthe Mannan MD  February 06, 2010 12:42 PM   New/Updated Medications: NEURONTIN 100 MG CAPS (GABAPENTIN) take one by mouth two times a day Prescriptions: NEURONTIN 100 MG CAPS (GABAPENTIN) take one by mouth two times a day  #60 x 3   Entered by:   Lowella Petties CMA   Authorized by:   Ruthe Mannan MD   Signed by:   Lowella Petties CMA on 02/06/2010   Method used:   Telephoned to ...       [facility] [address] [phone]* (retail)       [address] [place], [place]  [address]       Ph: [phone]       Fax: [phone]   RxID:   [id number]   Prior Medications: RENAL MULTIVITAMIN/ZINC  TABS (MULTIPLE VITAMIN) Take 1 tablet by mouth once a day MIDODRINE HCL 10 MG TABS (MIDODRINE HCL) Take 1 tablet by mouth once a day OMEPRAZOLE 40 MG CPDR (OMEPRAZOLE) Take 1 tablet by mouth once a day LYRICA 50 MG CAPS (PREGABALIN) Take 1 capsule by mouth tid times a day PHOSLO 667 MG CAPS (CALCIUM ACETATE (PHOS BINDER))  Take 4 capsules three times a day before meals. SENSIPAR 60 MG TABS (CINACALCET HCL) Take 1 tablet by mouth once a day NOVOLIN 70/30 70-30 % SUSP (INSULIN ISOPHANE & REGULAR) 60 units at bedtime NEURONTIN 100 MG CAPS (GABAPENTIN) take one by mouth two times a day Current Allergies: ! WELLBUTRIN (BUPROPION HCL) ! MORPHINE  Pt is no longer on lyrica.            Lowella Petties CMA  February 06, 2010 12:55 PM

## 2010-09-30 NOTE — Progress Notes (Signed)
Summary: refill request for lyrica  Phone Note Refill Request Call back at Home Phone [phone] Message from:  Patient  Refills Requested: Medication #1:  LYRICA 50 MG CAPS Take 1 capsule by mouth tid times a day Phoned request from pt.  [facility] [place].  Pt says she is totally out, per Dr. Hetty Ely ok to wait for Dr. Elmer Sow return.  Advised pt to give 24 hour notice next time.  Initial call taken by: Lowella Petties CMA,  October 15, 2009 4:33 PM  Follow-up for Phone Call        Patient Advised. Medication phoned to pharmacy.  Follow-up by: Delilah Shan CMA Duncan Dull),  October 16, 2009 10:56 AM    Prescriptions: LYRICA 50 MG CAPS (PREGABALIN) Take 1 capsule by mouth tid times a day  #90 x 1   Entered and Authorized by:   Ruthe Mannan MD   Signed by:   Ruthe Mannan MD on 10/16/2009   Method used:   Handwritten   RxID:   [id number]

## 2010-11-13 LAB — POCT I-STAT, CHEM 8
Creatinine, Ser: 4.7 mg/dL — ABNORMAL HIGH (ref 0.4–1.2)
Glucose, Bld: 131 mg/dL — ABNORMAL HIGH (ref 70–99)
HCT: 43 % (ref 36.0–46.0)
Hemoglobin: 14.6 g/dL (ref 12.0–15.0)
Potassium: 3.9 mEq/L (ref 3.5–5.1)
Sodium: 139 mEq/L (ref 135–145)
TCO2: 29 mmol/L (ref 0–100)

## 2010-11-16 LAB — GLUCOSE, CAPILLARY
Glucose-Capillary: 225 mg/dL — ABNORMAL HIGH (ref 70–99)
Glucose-Capillary: 231 mg/dL — ABNORMAL HIGH (ref 70–99)
Glucose-Capillary: 288 mg/dL — ABNORMAL HIGH (ref 70–99)

## 2010-11-16 LAB — POCT I-STAT 4, (NA,K, GLUC, HGB,HCT): Glucose, Bld: 281 mg/dL — ABNORMAL HIGH (ref 70–99)

## 2010-11-18 LAB — POTASSIUM: Potassium: 6 mEq/L — ABNORMAL HIGH (ref 3.5–5.1)

## 2010-11-19 LAB — POTASSIUM
Potassium: 3.6 mEq/L (ref 3.5–5.1)
Potassium: 6.3 mEq/L (ref 3.5–5.1)

## 2010-11-19 LAB — BASIC METABOLIC PANEL
CO2: 27 mEq/L (ref 19–32)
Chloride: 94 mEq/L — ABNORMAL LOW (ref 96–112)
Creatinine, Ser: 8.8 mg/dL — ABNORMAL HIGH (ref 0.4–1.2)
GFR calc Af Amer: 6 mL/min — ABNORMAL LOW (ref >60)
Potassium: 5.4 mEq/L — ABNORMAL HIGH (ref 3.5–5.1)
Sodium: 139 mEq/L (ref 135–145)

## 2010-12-02 LAB — GLUCOSE, CAPILLARY: Glucose-Capillary: 398 mg/dL — ABNORMAL HIGH (ref 70–99)

## 2010-12-05 LAB — GLUCOSE, CAPILLARY: Glucose-Capillary: 177 mg/dL — ABNORMAL HIGH (ref 70–99)

## 2010-12-06 LAB — COMPREHENSIVE METABOLIC PANEL
ALT: 20 U/L (ref 0–35)
Albumin: 3.7 g/dL (ref 3.5–5.2)
Alkaline Phosphatase: 288 U/L — ABNORMAL HIGH (ref 39–117)
Calcium: 9.5 mg/dL (ref 8.4–10.5)
GFR calc Af Amer: 7 mL/min — ABNORMAL LOW (ref >60)
Glucose, Bld: 154 mg/dL — ABNORMAL HIGH (ref 70–99)
Potassium: 5.1 mEq/L (ref 3.5–5.1)
Sodium: 136 mEq/L (ref 135–145)
Total Protein: 6.3 g/dL (ref 6.0–8.3)

## 2010-12-06 LAB — GLUCOSE, CAPILLARY
Glucose-Capillary: 142 mg/dL — ABNORMAL HIGH (ref 70–99)
Glucose-Capillary: 183 mg/dL — ABNORMAL HIGH (ref 70–99)

## 2010-12-06 LAB — CBC
MCHC: 33.1 g/dL (ref 30.0–36.0)
Platelets: 107 10*3/uL — ABNORMAL LOW (ref 150–400)
RDW: 17.3 % — ABNORMAL HIGH (ref 11.5–15.5)

## 2011-01-13 NOTE — Op Note (Signed)
[name], [name]            ACCOUNT NO.:  [id number]   MEDICAL RECORD NO.:  [id number]          PATIENT TYPE:  AMB   LOCATION:  SDS                          FACILITY:  [facility]   PHYSICIAN:  Lanna Poche, M.D. 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   PROCEDURE:  Pars plana vitrectomy, membrane peel, panretinal  photocoagulation, right eye.   SURGEON:  Lanna Poche, MD.   ASSISTANT:  Ms. Alfredia Ferguson.   ANESTHESIA:  General endotracheal.   ESTIMATED BLOOD LOSS:  Less than 1 mL.   COMPLICATIONS:  None.   OPERATIVE NOTE:  The patient was taken to the operating room where after  induction of general anesthesia, the right eye was prepped and draped in  the usual fashion.  A lid speculum was introduced in the conjunctival  fornix temporally and superonasally.  Hemostasis was obtained with laser  cautery and sclerotomies were fashioned 4 mm posterior to the limbus at  1:30, 10:30, and 7:30.  Superior sclerotomies were plugged and a 4-mm  infusion cannula was secured with a temporary sutures of 7-0 Vicryl at  7:30 sclerotomy.  The tip was visually inspected and found to be in the  vitreous cavity.  Landers ring secured with globe 7-0 Vicryl sutures at  3 and 9.  The plugs were removed and 30-degree prismatic lens was  applied on the surface of the eye.  A central core followed by  peripheral vitrectomy was performed.  There was no posterior hyaloid  separation.  The suction tip of the vitrector was used to engage the  posterior hyaloid nasal to the optic nerve and the posterior hyaloid was  freed of the surface of the retina and  peeled off out of the periphery  nasally and then superiorly.  The peripheral anterior and posterior  vitreal detachments were severed, an area of plaque-like preretinal  fibrosis was countered approximately slightly anterior to equator at 7  or 8 o'clock.  Scleral depression was used to isolate this area and the  anterior vitreous was removed.  The vitrector was then used to shave  this back and with gentle traction of the scar tissue the majority of  which could be removed without difficulty.  What was left behind was  just a small area with no residual traction was elected not to further  dissect this area.  The peripheral vitrectomy was then completed 360  degrees.  A magnifying flat lens was applied to the surface of the eye.  The vitreous was still adherent to the optic nerve, trimmed back and  peeled off the optic nerve.  The suction tip with extrusion line was  then used to backing with the preretinal hemorrhage.  No significant  epiretinal membranes were seen over the macula.  The instrument removed  from the eye, the holes plugged.  The [place] lens were removed.  Inspection with an ophthalmoscope and scleral depression revealed no  peripheral retinal breaks, only small gaps of retinal photocoagulation  pattern in the far periphery.  This was filled in with indirect laser.  The superior sclerotomies were then closed with 7-0 Vicryl.  The  infusion cannula was removed and preplaced sutures were secured.  The  conjunctiva was drawn and reapproximated with interrupted running suture  of 6-0 plain gut.  The pressure was checked with Barraquer tonometer and  found to be at approximately 21.  The subconjunctival space was  irrigated with 0.75% Marcaine followed by subconjunctival injection of  100 mg ceftazidime and 10 mg of Decadron.  Lid speculum was then removed  and mixed antibiotic was applied to the surface of the eye.  Eye patch  and shield was then placed over the patient's  right eye.  The patient  tolerated the procedure well.           ______________________________  Lanna Poche, M.D.     JTH/MEDQ  D:  04/22/2009  T:  04/23/2009  Job:  [id number]

## 2011-01-13 NOTE — Assessment & Plan Note (Signed)
OFFICE VISIT   [name], [name]  DOB:  09/24/1965                                       08/13/2008  CHART#:[id number]   REASON FOR VISIT:  Follow-up.   HISTORY:  This is a 45 year old female who underwent left thigh graft  placed by Dr. Edilia Bo on July 11, 2008.  She comes in today for  evaluation, her incision is well healed, there is a good thrill within  the graft, I think this can be used in the immediate future.   The patient is running out of access sites.  She had a thromboembolic  complication with her right thigh graft which had to be ligated.  I  think she would be a candidate for Hero catheter down the road and I  discussed this with her.  Hopefully, however, we will get longevity with  this left thigh graft which is ready to be accessed.   Jorge Ny, MD  Electronically Signed   VWB/MEDQ  D:  08/13/2008  T:  08/14/2008  Job:  [id number]   cc:   Fayrene Fearing L. Deterding, M.D.  Terrial Rhodes, M.D.  [name], M.D.

## 2011-01-13 NOTE — H&P (Signed)
NAMETERIANN, LIVINGOOD            ACCOUNT NO.:  [id number]   MEDICAL RECORD NO.:  [id number]          PATIENT TYPE:  EMS   LOCATION:  MAJO                         FACILITY:  [facility]   PHYSICIAN:  Ladell Pier, M.D.   DATE OF BIRTH:  1966-04-03   DATE OF ADMISSION:  06/21/2008  DATE OF DISCHARGE:                              HISTORY & PHYSICAL   CHIEF COMPLAINT:  Severe pain in the right lower extremity, dialysis  graft clotted up.   HISTORY OF PRESENT ILLNESS:  The patient is a 45 year old white female  with past medical history significant for type 1 diabetes,  gastroparesis, chronic hypertension and end-stage renal disease.  The  patient was at dialysis today at the [facility] where she  had dialysis for about 4 hours.  She stated that they noted that her  graft was clotted up over the arterial and the venous end.  She was  scheduled for thrombectomy tomorrow at [facility] at 9, and she was  sent home from dialysis to follow up in the morning.  The patient stated  that she got home and was in severe pain.  The pain was so severe that  it caused her to be very nauseous and throwing up.  So she came back to  the emergency room.  We were asked to admit the patient for pain  control.   PAST MEDICAL HISTORY:  1. Diabetes.  2. Gastroparesis.  3. Chronic hypertension.  4. History of catheter sepsis in August 2008 and December 2008.  5. Status post right partial foot amputation.   FAMILY HISTORY:  Noncontributory.   SOCIAL HISTORY:  The patient is married, no children.  She is on  disability.  No alcohol or tobacco use.   MEDICATIONS:  1. Lyrica 50 mg twice daily.  2. PhosLo 660 three times daily.  3. Novolin 70/30, 40 units at bedtime.  4. Dialyvite 1 tab daily.  5. Midodrine 10 mg three times daily.  6. Aspirin 81 mg daily.   ALLERGIES:  1. MORPHINE.  2. TYLENOL.   REVIEW OF SYSTEMS:  Negative, otherwise stated in the HPI.   PHYSICAL EXAMINATION:  VITAL  SIGNS:  Temperature 99.1, blood pressure  118/67, pulse 70, respirations 18.  Oxygen saturation 92%.  GENERAL:  The patient is laying on a stretcher flat.  Complaining of  pain in her right thigh.  HEENT:  Head is normocephalic, atraumatic.  Pupils reactive to light.  Throat without erythema.  CARDIOVASCULAR:  Regular rate and rhythm.  LUNGS:  Clear bilaterally.  ABDOMEN:  Obese.  Positive bowel sounds.  EXTREMITIES:  Without edema.  I could not hear a thrill over her graft  on the right lower extremity.   LABORATORY DATA:  WBC 5.8, hemoglobin 12.9, MCV 99.6, platelets 117.  PT  of 13.2, INR 1.0, PTT 27.  Sodium 138, potassium 6.1, chloride 95, CO2  of 29, glucose 178, BUN 26, creatinine 5.21, albumin 4.1.   ASSESSMENT/PLAN:  1. Intractable pain secondary to thrombosed graft.  2. Hyperkalemia.  3. End-stage renal disease.  4. Type 1 diabetes.  5. Chronic hypertension.  6. Thrombocytopenia.   Will admit the patient to the hospital for pain control.  Will arrange  for her to go over to [facility] at 9 o'clock in the morning to get her  thrombectomy.  Her potassium is 6.1.  She did have hemodialysis today.  Will get a stat repeat of potassium.  If elevated, will give her some  Kayexalate.  Will continue all her home medications.  Will hold onto her  70/30 for now and put her on Lantus and sliding scale.  The treatment  plan was discussed with the patient and her husband.      Ladell Pier, M.D.  Electronically Signed     NJ/MEDQ  D:  06/21/2008  T:  06/21/2008  Job:  [id number]   cc:   [facility]

## 2011-01-13 NOTE — Procedures (Signed)
VASCULAR LAB EXAM   INDICATION:  AVF, end-stage renal disease.   HISTORY:  Diabetes:  Yes.  Cardiac:  No.  Hypertension:  No.   EXAM:   IMPRESSION:  Patent right basilic vein transposition arteriovenous  fistula with normal velocities.   ___________________________________________  [name], M.D.   OD/MEDQ  D:  06/25/2010  T:  06/25/2010  Job:  [id number]

## 2011-01-13 NOTE — H&P (Signed)
NAMEJANEENE, SAND            ACCOUNT NO.:  [id number]   MEDICAL RECORD NO.:  [id number]          PATIENT TYPE:  INP   LOCATION:  1828                         FACILITY:  [facility]   PHYSICIAN:  Wilber Bihari. Caryn Section, M.D.   DATE OF BIRTH:  [birth year]   DATE OF ADMISSION:  08/25/2007  DATE OF DISCHARGE:                              HISTORY & PHYSICAL   [name] is a 45 year old white woman who is on dialysis at the  [facility] dialysis center (had hemodialysis yesterday), who felt fine  yesterday.  She awoke today with a temperature of 103.8 at home  according to her husband.  She also noted chills, aching, nausea and  vomiting.  When she came to the ER her blood pressure was 97/42.  She is  to be admitted.   PAST MEDICAL HISTORY:  1. DM with:      a.     Retinopathy.      b.     Nephropathy.      c.     Neuropathy (sensory, gastroparesis).  2. ESRD on dialysis Tuesday, Thursday, Saturday.  AVG placed right      upper arm by Dr. Earnestine Leys in [place] about 1 month ago.  3. Secondary hyperparathyroidism.  4. Amputated toes (first and second) right foot.  5. Chronically low blood pressure  6. Labial abscess, 2006 (I&D).  7. Decreased platelets   HOME MEDICATIONS:  1. PhosLo four t.i.d.  2. Nephro-Vite one q.a.m.  3. Lyrica 50 mg b.i.d.  4. Lantus 40 units q.h.s.  5. She uses IUD for contraception.   REVIEW OF SYSTEMS:  Nausea and vomiting this morning as well as  myalgias.  No menses.  No angina, no claudication, no melena, no  hematochezia, no gross hematuria, no renal colic, no purulent sputum, no  DOE, no orthopnea.  No loss of consciousness, no seizures.   ALLERGIES:  MORPHINE.   SOCIAL HISTORY:  Born in [place].  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  She last worked at [employer] in [place].  No cigarettes and no alcohol.  She has been married for a year.   FAMILY HISTORY:  Father age 39, well.  Mother age 32, diabetes mellitus  and  hypertension.  She has three sisters and one brother, who are  healthy.  She has no children.   PHYSICAL EXAM:  She is a weak, hot white woman.  Temperature 104.2, pulse 100, respirations 18, blood pressure 97/42.  NECK:  Not stiff.  CHEST:  PermCath in left IJ, no pus.  Clear to auscultation.  HEART:  No rub.  ABDOMEN:  Nontender,  Numerous acne eruptions back.  No evidence of abscess.  EXTREMITIES: No edema.  AVG patent in right upper arm.  Old clotted AVGs  are also present in the  right upper arm.  Amputated right first and  second toe.  NEUROLOGIC:  Decreased sensation in the feet.  Right-handed.   LAB:  Hemoglobin 11.7, WBC 19,100, platelets 107,000.  Sodium 132,  potassium 5.7, chloride 104, CO2 28, BUN 69, creatinine 8.8, glucose  202.  Chest X-Ray: No  infiltrate, no effusion, no CHF.   IMPRESSION:  1. Fever, weakness, elevated white count - probable infected PermCath.  2. End-stage renal disease on dialysis.  3. Decreased blood pressure.  4. Diabetes mellitus.   PLAN:  1. Blood cultures, vancomycin and Fortaz, use right upper arm AVG and      discontinue left IJ PermCath.  2. Dialysis tomorrow.  Get information from outpatient dialysis      center.  3. Continue midodrine and carnitine.  4. Sliding-scale insulin.  Hold Lantus for now until eating okay.      Richard F. Caryn Section, M.D.  Electronically Signed     RFF/MEDQ  D:  08/25/2007  T:  08/25/2007  Job:  [id number]

## 2011-01-13 NOTE — Assessment & Plan Note (Signed)
OFFICE VISIT   Tonya, [name]  DOB:  06/19/1966                                       11/04/2007  CHART#:[id number]   HISTORY:  The patient presents today for followup of removal of infected  nonfunctioning forearm graft on 10/21/2007 by Dr. Hart Rochester.  She presented  with infection in an old nonfunctioning graft and this was removed  without difficulty.  She has good healing of the incisions around this  area.  She currently is being dialyzed through a right femoral loop  graft and this is working without difficulty as well.  She will continue  her usual activities without limitations and see [place] on a p.r.n. basis.   Larina Earthly, M.D.  Electronically Signed   TFE/MEDQ  D:  11/18/2007  T:  11/21/2007  Job:  [id number]

## 2011-01-13 NOTE — H&P (Signed)
[name], [name]            ACCOUNT NO.:  [id number]   MEDICAL RECORD NO.:  [id number]          PATIENT TYPE:  INP   LOCATION:  2009                         FACILITY:  [facility]   PHYSICIAN:  Maree Krabbe, M.D.DATE OF BIRTH:  [birth year]   DATE OF ADMISSION:  04/16/2007  DATE OF DISCHARGE:                              HISTORY & PHYSICAL   CHIEF COMPLAINT:  Fever times one day.   HISTORY OF PRESENT ILLNESS:  This is a 45 year old female with a past  medical history of end-stage renal disease, on dialysis since September  2004, and difficult to control insulin-dependent diabetes who presents  with four days of constipation with abdominal pain and a fever of 102.4  on the night prior to admission.  She is also complaining of some  fatigue and lightheadedness and saying specifically that this is not  similar to other cath infections because there is no associated nausea  and vomiting.  She specifically denies cough, congestion, shortness of  breath, chest pain, any sites of swelling or purulent discharge.  She  denies boils.  She does state that she has had a rash on her abdomen but  it is now better.   PAST MEDICAL HISTORY:  As above.  1. End-stage renal disease secondary to diabetes, on dialysis since      May 18, 2003.  She also has secondary hyperparathyroidism.      She is on a transplant list and is lowing weight in order to become      applicable for that transplant.  2. Insulin-dependent diabetes mellitus with associated gastroparesis,      retinopathy status post laser coagulation and severe neuropathy.  3. Hypotension  4. History of iron-deficiency anemia secondary to a history of vaginal      bleeding.  5. History of labial abscess in 2006, status post I&D.  6. Chronic thrombocytopenia with a platelet count of 54 in March of      2007 and multiple findings in the low 100s.  7. Status post toes amputation on the right foot.  Also status post a      left  plantar ulcer, July 2005.  8. Morbid Obesity   MEDICATIONS:  1. She takes PhosLo two tablets once with every meal.  2. Nephro-Vite, one tab once a day.  3. Lyrica 50 mg twice a day.  4. She receives 12,000 units of depo at each dialysis and receives 50      mcg of infed once a week.  5. There is some discrepancy concerning her insulin intake.  According      to the patient, she takes insulin 70/30, 40 units at night only.      Per the dialysis records, it is 82 units twice a day.  On her last      discharge summary date, it is 8 units only twice a day.  There      again, as per the patient, she is taking 40 units once at night.   SOCIAL HISTORY:  She does not smoke, drink or use drugs.  She is married  and is currently on  a protein bar only diet, drinking only diet ginger-  ale and water, trying to limit her intake to 2 kilos per day in an  attempt to continue losing weight.   FAMILY HISTORY:  Diabetes and hypertension on her mother's side.  Diabetes on the father's side and the father has a history of CAD,  status post angio.  There is also history of cancer in the family.   REVIEW OF SYSTEMS:  GASTROINTESTINAL:  Significant for severe  constipation for which she has tried Colace with no success.  No bowel  movements over the last four days and severe abdominal pain.  No nausea  or vomiting.   VITAL SIGNS:  Temperature 101.5, pulse 92, respiratory rate 18, blood  pressure 87/56 lying down.  O2 saturation 92% on room air.  GENERAL APPEARANCE:  She is tired and weak and morbidly obese but in no  apparent distress.  HEENT:  Moist oral mucosa.  No nasal discharge noted.  NECK:  Was supple with no JVD.  CARDIOVASCULAR EXAM:  Regular rate and rhythm.  S1, S2, no murmur.  LUNGS:  Were clear to auscultation bilaterally.  No crackles or wheezes.  SKIN:  She had a very mild erythematous rash on her abdomen.  It looked  like it had been the remnants of her SMA, being worse at one time  but  was now resolving.  ABDOMINAL:  Bowel sounds are positive.  Diffuse mild tenderness.  No  rebound or even mild voluntary guarding.  No masses palpable.  EXTREMITIES:  Trace to 1+ edema bilaterally in lower extremities.  CHEST:  Her dialysis access is via a left anterior chest.  MUSCULAR EXAM:  Upon sitting up, she became lightheaded, was very weak  but the neuro exam was nonfocal.  She was alert and oriented x3.  Psych  was appropriate.   A plain film of the abdomen showed no obstruction to free air.  A  moderate amount of feces in the colon.   LABORATORY DATA:  White blood cell count of 12 with an ANC of 10.5,  hemoglobin 15 with hematocrit of 47, platelet count 48.  Sodium 135,  potassium 4.2, chloride 93, bicarb 27, BUN 81, creatinine 8.8, glucose  96, calcium 9.6, lipase 16, AST 18, ALT 18, alk phos 122, albumin 3.9,  T. Bili 1.4, total protein 7.3.  She had two blood cultures pending.   ASSESSMENT:  1. Fever.  2. Hypotension.  3. Leukocytosis.  4. End-stage renal disease.  5. Insulin-dependent diabetes mellitus.  6. Constipation.   PLAN:  Issue #1:  Fever, hypotension and leukocytosis.  The patient  meeting at least SIRS criteria.  We will treat as if she has a catheter  site infection.  After the blood cultures have been drawn, we will start  her on vancomycin and Fortaz, knowing that the vancomycin will also need  to be given after dialysis.  Negative chest x-ray makes it unlikely that  she has pneumonia.  Her prior AV fistula and graft sites look clean.  Continue with p.r.n. Tylenol.  IV fluids as needed for hypotension.  Dialysis only while monitoring blood pressure very closely.  Continue to  monitor her for signs of worsening infection.  If it turns out her cath  sited is infected and the antibiotics do not clear it up, then we may  need to address catheter removal but we will monitor the effects of her  antibiotics.  Issue #2:  End-stage renal disease.  She  is  scheduled for dialysis  today, August 16.  She will perform dialysis with antibiotics on board  and then careful monitoring of vitals.  Records from [place] are  obtained and in the chart.  Issue #3:  Insulin-dependent diabetes mellitus.  There is some question  concerning her insulin regime.  She states 70/30 at night, 40 units, but  there are discrepancies based on her dialysis records and her last  discharge summary.  We will start with extensive sliding scale with 10  units of Lantus, knowing that her current CBC is only 96, with a goal to  avoid hypoglycemia.  We will need to address this when she is feeling  better upon what her scheduled insulin regime will be.  We also know  that she is only eating protein bars so her sugar intake is, therefore,  dramatically reduced from it has been prior.  Something else to keep in  mind while she is taking her insulin.  Issue #4:  Constipation times four days.  The plain film of the abdomen  did not show any acute problems.  We will start her on an enema and, as  needed, sorbitol and monitor her for improvement.   For prophylaxis, we will provide STDs with DVT prophylaxis and Protonix.  We will also monitor her on tele and resume dialysis.      Valetta Close, M.D.  Electronically Signed      Maree Krabbe, M.D.  Electronically Signed    JC/MEDQ  D:  04/16/2007  T:  04/17/2007  Job:  [id number]

## 2011-01-13 NOTE — Discharge Summary (Signed)
NAMELEWIS, GRIVAS            ACCOUNT NO.:  [id number]   MEDICAL RECORD NO.:  [id number]          PATIENT TYPE:  INP   LOCATION:  5532                         FACILITY:  [facility]   PHYSICIAN:  Valetta Close, M.D.   DATE OF BIRTH:  14-Jul-1966   DATE OF ADMISSION:  04/16/2007  DATE OF DISCHARGE:  04/23/2007                               DISCHARGE SUMMARY   DISCHARGE DIAGNOSES:  1. Catheter sepsis secondary to methicillin-resistant Staphylococcus      aureus.  2. End stage renal disease.  3. Insulin-dependent diabetes type 2 with complicating gastroparesis,      retinopathy and severe neuropathy.  4. Secondary hyperparathyroidism.  5. Hypertension.  6. Status post right toes amputation September 2004 and a chronic left      plantar ulcer.  7. Iron-deficiency anemia.  8. History of labial abscess in 2006 status post incision and      drainage.  9. Chronic thrombocytopenia.  10.A resolved abdominal rash.  11.Persistent hypotension.  12.History of vaginal.  13.Morbid obesity.   DISCHARGE MEDICATIONS:  1. Lyrica 50 mg by mouth twice a day.  2. Nephrovite 1 tablet once a day.  3. Insulin 70/30, 40 units at night only with dinner.  4. Midodrine 10 mg by mouth 3 times a day.  She was given 90 pills      with 3 refills.  5. Magic mouthwash swish and swallow 5 mL once every 6 hours for 1      week.  6. Ancef 2 grams IV with dialysis.  She is to continue for a total of      2 weeks.  7. She was told specifically to hold her PhosLo.  8. She receives Epogen 12,000 units with dialysis.  9. InFeD 50 mg once a week.   Her estimated dry weight is 108 kg.  She has a 2K+ bath.  Her duration  is 5 hours with a BFR of 450.   DISCHARGE CONDITION AND FOLLOW UP:  Ms. Canino is stable.  She will  continue with her prior dialysis schedule, which is Tuesday, Thursday  and Saturday at the [place] dialysis location.  She should have her  phosphorus checked periodically, as she has been  taken off of her PhosLo  because of her recent diet change, where she is only eating protein  bars, water and diet Ginger Ale.  Her phosphorus levels have become low.  Something else that will need to be followed up on is her insulin  requirements, again as her diet has completely changed.  Though she says  she monitors herself for hypoglycemia, something that will need to also  be watched as an outpatient.  Also, it will be determined when she can  stop her Ancef, which we would prefer for her to stay on for at least 2  weeks, as her Staph. aureus infection was pen-sensitive.   PROCEDURES PERFORMED:  No procedures were performed during this  hospitalization.   CONSULTATIONS:  No consultations were obtained.   BRIEF HISTORY AND PHYSICAL:  Ms. Archana Eckman is a 45 year old  female with a past medical history as stated  previously, who presented  with 4 days of abdominal pain and fever of 103 the night prior to  admission.  She has also had some fatigue and lightheadedness.  She says  her symptoms were similar to one to a prior cath infection, exception  there is no vomiting associated.   PHYSICAL EXAMINATION:  VITAL SIGNS:  Temperature 101.5, pulse 92,  respiratory rate 18, blood pressure 87/56, O2 saturation 92% on room  air.  GENERAL:  She appeared tired, weak, morbidly obese, and fairly  uncomfortable.  HEENT:  She did, however, have moist oral mucosa.  No nasal discharge.  CARDIOVASCULAR:  Regular rate and rhythm.  S1 and S2.  No murmurs.  LUNGS:  Clear to auscultation bilaterally.  ABDOMINAL:  She had a very mild erythematous rash on her abdomen.  Positive bowel sounds with diffuse mild tenderness, but no rebound or  guarding.  She had no palpable masses.  NEUROLOGIC:  She became lightheaded upon sitting up, but otherwise her  neuro exam was nonfocal.  PSYCH:  Appropriate.   LABORATORY DATA:  Her white blood cell count was 12 with an ANC of 10.5.  Her hemoglobin was 15  with hematocrit 47, platelet count 48.  Sodium  135, potassium 4.2, chloride 93, bicarb 27, BUN 81, creatinine 8.8.  Glucose 96, calcium 9.6, lipase 16, AST 18, ALT 18, alk-phos 122,  albumin 3.9.  Total bili 1.4 and total protein 7.3.   For more detailed history and physical please refer to the chart.   HOSPITAL COURSE:  1. Cath sepsis.  She was started on vancomycin and Fortaz for presumed      catheter infection.  Shortly after starting on these medications,      temperature improved with spikes; however, no specific relation to      usage of her catheter.  Once starting on the vancomycin and Fortaz,      her temperature did return under control.  No procedure had to be      done for her HD catheter that was in place.  Overall, her      temperature was controlled.  Her blood pressure remained low and      thus she was kept on antibiotics for a total of 6 days.  Both blood      cultures came back positive for MSSA only.  After the 6th day,      decision was made to switch her to Ancef post dialysis.  Again, she      remained afebrile during all of this and it appeared that her      infection had resolved.  She was kept on Ancef following her      discharge.   1. Hypotension.  At first, this was deemed associated with her sepsis,      but when her sepsis resolved, her hypotension remained.  She      actually required being placed on a dopamine drip to keep her blood      pressure above 80 and she was unable to be weaned off of this drip.      Her records were reviewed and it was noted that she often has      hypotension during dialysis that can get quite low.  It was also      noted that she is on a crash diet with very little p.o. intake that      also might be contributing to her hypotension.  However, she was  not on any blood pressure medications.  The decision was made to      start Ms. Hadlock on p.o. midodrine, then the goal to titrate      off the dopamine.  Cortisol  level was obtained and was normal.  2-D      echocardiogram was obtained, which showed no abnormalities, so      again, no specific etiology of her hypotension was found.  With the      p.o. midodrine after a day, she was able to be titrated off the      dopamine with her blood pressure remaining tolerable.  On      discharge, she will kept on the midodrine, again 10 mg p.o. 3 times      a day.  We will have to be very careful with dialysis and not get      her too hypotensive.  Again, this once again considering that the      diet will likely affect her estimated dry weight, as again she is      taking very little p.o. intake.   1. Nausea and vomiting.  This eventually resolved with control of her      blood pressure and sepsis, but it did take quite some time.      Towards her 6th day of hospitalization, her appetite did improve      and she was taking regular food.  This is a chronic problem with      her and she does have, likely a touch of gastroparesis in addition      to the sepsis, which she came in with contributing, but she was      able to again eat without difficulty and her abdominal exam remains      benign.  Once she was able to have improved bowel movements, again      this was with multiple enemas needing to be given.  Once she was      able to clear her bowels, her symptoms did improve.  2. Question of a tooth infection.  She did have some superficial      erythema on the right upper part of her palate.  She did require      need to look into her mouth; it looked like a decaying tooth in the      back of her right molars.  She was advised to obtain a dental      evaluation and was kept on magic mouthwash 5 mL every 7 days with a      refill as needed.  3. Diabetes.  Again with her very limited p.o. intake, she was kept on      just a sliding scale and that was enough for her.  She will be      placed back on her 70/30 at discharge with a specific note to be      very  careful about her glucose to make sure she does not become      hypoglycemic.  4. Hypophosphatemia.  Again, likely secondary to her change in diet.      She was taken off the PhosLo and this will be followed up on as an      outpatient.   DISCHARGE LABS AND VITALS:  Temperature 98.5, blood pressure 91-109/53-  57, heart rate 68-72, O2 saturation 98% on room air.  Her last weight  was 109.6 kg.  Sodium 139, potassium 3.7, chloride 101, bicarb  25, BUN  31, creatinine 7.5, glucose 167, phosphorus 1.9, calcium 8.  Hemoglobin  13, hematocrit 40, white blood cell count 3.9, platelets 105.      Valetta Close, M.D.  Electronically Signed     JC/MEDQ  D:  04/28/2007  T:  04/28/2007  Job:  [id number]   cc:   [facility]

## 2011-01-13 NOTE — Assessment & Plan Note (Signed)
OFFICE VISIT   [name], [name]  DOB:  12-Oct-1965                                       05/01/2010  CHART#:[id number]   The patient returns for followup today.  She came in for suture removal  after having angioplasty of her right basilic vein transposition by Dr.  Imogene Burn as well as angioplasty of the outflow of her left thigh graft.  She  states that she has some pain and tenderness in her right shoulder and  upper arm when she coughs.  Otherwise she is doing well.  They are  currently still using the left thigh graft and the plan is to continue  using this unless this fails and then consider using her basilic vein  transposition.  The basilic vein transposition procedure was done in  June of 2011 by Dr. Edilia Bo.   Suture was removed from her right upper extremity.  There is an audible  bruit in the right upper arm.  The basilic vein transposition is  palpable although fairly small in character on the medial aspect of the  right upper arm.  She has no evidence of steal in the right arm.   Her left thigh graft is also healed and the sutures were removed from  this and there is audible bruit within it.   IMPRESSION:  Status post angioplasty of the basilic vein transposition.  We will set her up for an ultrasound of this fistula as well as a  followup appointment in 1 month's time with Dr. Edilia Bo to reassess  whether or not this fistula is going to be a usable access.  It is still  fairly small in caliber right now but her angioplasty was only performed  1 week ago.  As far as the cough pain is concerned in her right arm I am  not sure what this is related to but I do not believe that she has any  significant problem with the fistula at this point.  They will continue  to use her thigh graft in the meanwhile.     Janetta Hora. Fields, MD  Electronically Signed   CEF/MEDQ  D:  05/01/2010  T:  05/02/2010  Job:  [id number]   cc:   Garnetta Buddy, M.D.  [name], M.D.

## 2011-01-13 NOTE — Discharge Summary (Signed)
NAMEHARUNA, ROHLFS            ACCOUNT NO.:  [id number]   MEDICAL RECORD NO.:  [id number]          PATIENT TYPE:  INP   LOCATION:  3040                         FACILITY:  [facility]   PHYSICIAN:  Maree Krabbe, M.D.DATE OF BIRTH:  March 24, 1966   DATE OF ADMISSION:  09/03/2007  DATE OF DISCHARGE:  09/10/2007                               DISCHARGE SUMMARY   DISCHARGE DIAGNOSES:  1. End-stage renal disease status post clotted AV graft x2 with      hyperkalemia on admission.  2. Insulin-dependent diabetes mellitus.  3. Chronic hypotension.  4. Anemia of chronic disease.   PROCEDURES:  1. September 03, 2007, declot of right upper extremity graft by Dr. Richarda Overlie.  2. September 04, 2007, placement of right internal jugular vein Diatek      catheter by Dr. Myra Gianotti.  3. September 08, 2007 placement of right thigh loop graft by Dr.      Myra Gianotti.  4. Hemodialysis.   CONSULTATIONS:  Vein and vascular surgery.   HISTORY OF PRESENT ILLNESS:  Ms. Heward is a 45 year old Caucasian  female who has had multiple failed access using both upper extremities.  The patient was told by her vascular surgeon in [place], [place], that she needed a leg graft, but did not want to have a right  graft and found a [place], [address] Physiological scientist who put a  new right upper arm graft in approximately one month ago.  Apparently,  the patient has had previous accesses in that area which have failed.  She was then admitted approximately a week and a half ago on August 25, 2007, with catheter sepsis due to coag negative staph.  The catheter  was removed and the right AV graft was successfully used.  She was  discharged home about six days ago.  She had outpatient dialysis two  times this week and presented to dialysis with the graft being clotted  today.  The patient did undergo a successful thrombolysis by Dr. Lowella Dandy  today with interventional radiology and she was brought to  hemodialysis  where she was unable to be cannulated secondary to the second occurrence  of thrombosis in the graft.  The patient is now being admitted with  hyperkalemia as well as clotted access.   ADMISSION LABS:  Potassium 5.5.  Hemoglobin 9.9, hematocrit 30,  platelets 211.  Glucose 268, creatinine 8.98, albumin 3, calcium 8.1,  phosphorus 10.3.   HOSPITAL COURSE:  PROBLEM #1 -  END-STAGE RENAL DISEASE WITH CLOTTED  ACCESS ON ADMISSION AS WELL AS HYPERKALEMIA, now resolved.  A.  STATUS POST NEW THIGH AVG PLACEMENT:  The patient as admitted and  emergently dialyzed as stated in history of present illness.  She did  undergo placement of a Diatek catheter in the interim and VVS was  consulted to place thigh AVG  during admission.  The patient did dialyze  without intervention.  She is chronic hypotensive without issues this  admission.  She did continue on vancomycin from previous coag-negative  staph bacteremia secondary to a prior permanent dialysis catheter.  The  patient tolerated the thigh graft placement well.  No further  interventions.   PROBLEM #2 -  INSULIN-DEPENDENT DIABETES MELLITUS:  The patient's blood  sugars slightly elevated this admission with CBGs from the 200 to mid  300 range.  Her home dose of Lantus was increased.  There was no  hypoglycemic events.  The patient placed on a carb modified renal diet.   PROBLEM #3 -  CHRONIC HYPOTENSION:  The patient currently on midodrine  three times a day as well as levocarnitine with her dialysis treatment.  As stated in #1, she did undergo her hemodialysis treatments without  intervention and maintained appropriate pressure.   PROBLEM #4 -  ANEMIA OF CHRONIC DISEASE:  Hemoglobin stable in the 9s  this admission.  Her Aranesp was increased during admission as well as  outpatient.  The patient did not require any transfusions  postoperatively.   DISCHARGE INSTRUCTIONS:  The patient is discharged in stable condition.  She  will remain on a renal diet, 80 grams of protein, 2 grams of salt, 2  grams of potassium with 12 mL per day fluid restriction.  She is also  educated in a diabetic carb modified diet.  She is instructed to keep  her permanent catheter clean and dry and avoid showers.  She is  returning home with her spouse who is her primary caregiver.  The  patient is to also keep the dressing on the right thigh graft and the  hemodialysis nurses will change that at next treatment.  Hemodialysis  orders called to the charge nurse at [facility].  No  change to the patient's home medications.      Azucena Fallen, [place]      Maree Krabbe, M.D.  Electronically Signed    MY/MEDQ  D:  10/08/2007  T:  10/10/2007  Job:  [id number]   cc:   [facility]

## 2011-01-13 NOTE — Op Note (Signed)
NAMEALEXIZ, COTHRAN            ACCOUNT NO.:  [id number]   MEDICAL RECORD NO.:  [id number]          PATIENT TYPE:  AMB   LOCATION:  SDS                          FACILITY:  [facility]   PHYSICIAN:  Larina Earthly, M.D.    DATE OF BIRTH:  02/14/1966   DATE OF PROCEDURE:  02/04/2007  DATE OF DISCHARGE:                               OPERATIVE REPORT   PREOPERATIVE DIAGNOSIS:  End-stage renal disease with occluded right  upper arm AV Gore-Tex graft.   POSTOPERATIVE DIAGNOSIS:  End-stage renal disease with occluded right  upper arm AV Gore-Tex graft.   PROCEDURE:  Thrombectomy, interposition jump graft revision to higher  axillary vein end-to-end of right upper arm AV Gore-Tex graft.   SURGEON:  Larina Earthly, M.D.   ASSISTANT:  Jerold Coombe, P.A.   ANESTHESIA:  Is LMA.   COMPLICATIONS:  None.   DISPOSITION:  To recovery room stable.   DESCRIPTION OF PROCEDURE:  The patient taken position where the right  arm prepped and draped in sterile fashion.  Incision was made over the  prior axillary scar and carried down to expose the Gore-Tex graft.  The  graft anastomosis to the axillary vein was taken down. The vein was  somewhat this sclerotic.  The graft was thrombectomized.  The  arterialized plug was removed and excellent inflow was encountered.  This was flushed heparinized saline and reoccluded.  A 7 mm  interposition graft brought onto the field was spatulated.  The vein was  exposed further proximally and was spatulated and the 7 mm graft was  sewn end-to-end to the vein with a running 6-0 Prolene suture.  Clamps  removed.  The graft flushed heparinized saline.  The graft then cut to  appropriate length was sewn end-to-end to the old graft with running 6-0  Prolene suture.  Clamps removed and excellent thrill was noted.  Wounds  irrigated saline.  Hemostasis with electrocautery, wounds closed 3-0  Vicryl in the subcutaneous and subcuticular tissue. Benzoin and Steri-  Strips were applied.  The patient was noted to have a high venous  pressure with very brisk venous backbleeding.  She had had a central  venogram at the time of an attempted thrombolysis in radiology prior to  her last occlusion on Jan 04, 2007 showing widely patent central veins.      Larina Earthly, M.D.  Electronically Signed     TFE/MEDQ  D:  02/04/2007  T:  02/04/2007  Job:  [id number]

## 2011-01-13 NOTE — Assessment & Plan Note (Signed)
OFFICE VISIT   DE, LIBMAN  DOB:  18-Jun-1966                                       04/10/2010  CHART#:[id number]   I saw the patient in the office today to follow up her right basilic  vein transposition which was performed on 02/18/2010.  This is a 44-year-  old woman who had multiple access attempts in all four extremities.  She  currently has a functioning thigh graft.  We evaluated for a possible  basilic vein transposition on the right as a backup if her left thigh  graft failed.  She had had a previous upper arm fistula in the right and  previous grafts in her upper arm with multiple revisions.  The vein  mapping suggested a reasonable basilic vein although it appeared to  empty into the brachial system in the mid upper third of the arm.  In  reviewing the operative report the basilic vein emptied into the deep  system at the junction of the mid to upper third of the arm.  Therefore  we proceeded with placement of the fistula although it was not ideal in  terms of length.  She comes in for a routine visit and check on the  maturation of the fistula.  She has had no problems with it and no pain  associated with her fistula.  She continues to dialyze with her thigh  graft in the left thigh.   PHYSICAL EXAMINATION:  Vital signs:  Blood pressure is 136/84, heart  rate is 77, temperature 97.9.  Lungs:  Clear bilaterally to  auscultation.  She has a palpable radial pulse on the right and the  fistula in the right upper arm has a bruit.  It is slightly pulsatile  however.   We did obtain a vein map in the office today and did not show any  critical areas of stenosis within the fistula.  The highest velocity in  the mid upper arm was 148 cm/sec.   Given that the fistula is pulsatile I think it would be worth proceeding  with a fistulogram to evaluate for any proximal stenosis which could  potentially be addressed with angioplasty.  We will give the  fistula a  few more weeks to heal and then try to schedule this on a nondialysis  day.  She dialyzes Mondays, Wednesdays and Fridays and I will try to  schedule this on a Thursday with Dr. Imogene Burn.  We have discussed the  procedure and the potential complications and she is agreeable to  proceed.     [name], M.D.  Electronically Signed   CSD/MEDQ  D:  04/10/2010  T:  04/11/2010  Job:  [id number]   cc:   Leonides Sake, MD

## 2011-01-13 NOTE — Assessment & Plan Note (Signed)
OFFICE VISIT   SYLVER, VANTASSELL  DOB:  1966/08/27                                       01/16/2010  CHART#:[id number]   I saw the patient in the office today to evaluate her for future access.  She has had multiple grafts in both upper extremities.  This occluded  several times and has undergone thrombolysis for this.  We were asked to  evaluate her for possible access in the future if this left thigh graft  fails.  Of note, she dialyzes Mondays, Wednesdays and Fridays and  recently the graft in the left thigh has been working well with no  problems.  She has had no recent uremic symptoms.  Specifically she  denies any significant shortness of breath, palpitations, fatigue,  weakness, nausea or vomiting.   PAST MEDICAL HISTORY:  Significant for diabetes.  She has had multiple  previous infections in the past with infected grafts and infected  catheters.  She denies any history of hypertension,  hypercholesterolemia, history of previous myocardial infarction or  history of congestive heart failure.   SOCIAL HISTORY:  She is married.  She does not smoke tobacco.   REVIEW OF SYSTEMS:  CARDIOVASCULAR:  She has had no recent chest pain,  chest pressure, palpitations or arrhythmias.  She has had no  claudication or rest pain.  PULMONARY:  She has had no productive cough, bronchitis, asthma or  wheezing.  HEMATOLOGIC:  She does have a history of chronic anemia.   PHYSICAL EXAMINATION:  General:  This is a pleasant 45 year old woman  who appears her stated age.  Vital signs:  Blood pressure is 133/68,  heart rate is 71, temperature 97.4.  Lungs:  Clear bilaterally to  auscultation.  Cardiovascular:  She has a regular rate and rhythm.  She  has a functioning left thigh AV graft with a good thrill.   I did independently interpret her duplex today and it appears that she  could potentially have a usable basilic vein in the left upper arm.  I  have  recommended that we explore her upper arm basilic vein on the right  and if this is acceptable proceed with placement of a basilic vein  transposition.  I have explained that there is a chance that we will  find intraoperatively that the vein is not usable for a fistula.  The  patient has plans to go to [facility] and does not want to schedule the  surgery until June 21.  In the meantime her graft in the left thigh is  functioning well.  I have reviewed her most recent thrombolysis and it  appears that if the graft clots in the future and develops thrombolysis  she could be a candidate for attempted surgical thrombectomy or revision  as needed given that she is running out of options.  I would favor this  before considering proceeding with placement of a HeRO graft in the  future.  Hopefully we will be able to proceed with basilic transposition  on June 21.     [name], M.D.  Electronically Signed   CSD/MEDQ  D:  01/16/2010  T:  01/17/2010  Job:  [id number]   cc:   Garnetta Buddy, M.D.

## 2011-01-13 NOTE — Op Note (Signed)
[name], [name]            ACCOUNT NO.:  [id number]   MEDICAL RECORD NO.:  [id number]          PATIENT TYPE:  AMB   LOCATION:  SDS                          FACILITY:  [facility]   PHYSICIAN:  Quita Skye. Hart Rochester, M.D.  DATE OF BIRTH:  05/19/1966   DATE OF PROCEDURE:  10/21/2007  DATE OF DISCHARGE:  10/21/2007                               OPERATIVE REPORT   PREOPERATIVE DIAGNOSIS:  End-stage renal disease with infected left  forearm AV graft - nonfunctional.   POSTOPERATIVE DIAGNOSIS:  End-stage renal disease with infected left  forearm arteriovenous graft - nonfunctional.   POSTOPERATIVE DIAGNOSIS:  End-stage renal disease with infected left  forearm arteriovenous graft - nonfunctional.   OPERATION:  1. Removal of infected nonfunctional left forearm AV graft and  2. Removal of Diatek catheter right internal jugular vein - no longer      necessary.   SURGEON:  Dr. Hart Rochester.   FIRST ASSISTANT:  Jerold Coombe, P.A.   ANESTHESIA:  General endotracheal.   PROCEDURE:  The patient taken to the operating room, placed in supine  position at which time satisfactory general endotracheal anesthesia was  administered.  Left arm was prepped with Betadine scrub and solution and  draped routine sterile manner.  There was a nonfunctional AV graft from  the brachial artery which had been revised up to the distal brachial  vein in the upper arm with erythema on the ulnar side of the loop.  Skin  incision was made over this area, and the graft was encountered and was  not incorporated with some purulence surrounding it.  The graft was  removed in this area and sent for culture.  The majority of the graft  was well incorporated around the circumference of the loop with the  exception of this one area.  Removal of the graft required five separate  incisions, one in the distal upper arm, one in the antecubital area  where the graft was anastomosed to the brachial artery, and two others  at the  five and 7 o'clock positions.  By dissecting beneath the skin,  the graft was completely removed; the vein was ligated on the venous end  and on the arterial end, the brachial artery was essentially totally  occluded with no Doppler flow audible, although there was radial and  ulnar Doppler flow of the wrist.  Therefore the graft was excised  leaving about a 4 mm cuff of Gore-Tex which was well incorporated, and  this was oversewn with 6-0 Prolene.  After complete removal of the graft  and thorough irrigation, the wounds were all closed in layers with  Vicryl.  The wound that had the purulence was closed with one layer and  skin staples other wounds in a subcuticular fashion.  Sterile dressing  applied.  Attention turned to the right upper chest where there was a  Diatek catheter which was no longer needed because of a functional right  thigh graft.  This was prepped and draped in routine manner, and  the cuff was dissected free from beneath the skin out of the exit site  and the  catheter easily removed.  There was no evidence of any  infection.  Exit site was closed with two 3-0 nylon sutures.  Sterile  dressing applied.  The patient taken to recovery room in satisfactory  condition.      Quita Skye Hart Rochester, M.D.  Electronically Signed     JDL/MEDQ  D:  10/21/2007  T:  10/22/2007  Job:  [id number]

## 2011-01-13 NOTE — Procedures (Signed)
CEPHALIC VEIN MAPPING   INDICATION:  End-stage renal disease.   HISTORY:   EXAM:  The right cephalic vein is not evaluated.   The right basilic vein is compressible.  Diameter measurements range  from 0.36 to 0.58 cm.   The left cephalic vein is not evaluated.   The left basilic vein is compressible.  Diameter measurements range from  0.22 to 0.24 cm.   See attached worksheet for all measurements.   IMPRESSION:  1. Patent's bilateral cephalic veins are not evaluated due to previous      graft.  2. Patient's right basilic vein is of acceptable diameter for use as a      dialysis access site.   ___________________________________________  [name], M.D.   CB/MEDQ  D:  01/17/2010  T:  01/17/2010  Job:  [id number]

## 2011-01-13 NOTE — Assessment & Plan Note (Signed)
OFFICE VISIT   [name], REMMERT R  DOB:  1966/02/01                                       06/25/2010  CHART#:[id number]   I saw the patient in the office today to evaluate her right upper arm  basilic vein transposition.  She also has a functioning left thigh AV  graft.  This a pleasant 64 woman who has had multiple access attempts in  all 4 extremities.  She currently has a functioning left thigh graft.  We were asked to evaluate her for a basilic vein transposition on the  right and a backup.  She had a previous upper arm fistula on the right  and previous grafts in the upper arm with multiple revisions.  I  performed a right basilic vein transposition on February 18, 2010.  The  basilic vein emptied into the deep system at the junction of the mid to  upper third of the arm.  The vein above this was sclerotic.   She had poor maturation of her fistula and underwent a fistulogram by  Dr. Imogene Burn on April 24, 2010.  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  She also had a successful  venous outflow angioplasty of her left thigh graft.  She comes in to  have her fistula checked.   Overall, she has been doing well and has no specific complaints, and has  been using her thigh graft without difficulty.   On examination, the upper arm fistula on the right has a thrill although  the vein does not appear to have enlarged significantly.  She has a  palpable radial pulse.  She has an excellent thrill in her left thigh  graft.  By duplex, the right basilic vein transposition is patent with  normal velocities.   We do not see any problems in the fistula although it has not yet  enlarged significantly.  I think all we can do is continue to give this  time and hopefully it will continue to enlarge.  I do not think there is  anything to do differently from a  surgical standpoint or endovascular  standpoint to help maturation of this fistula.  I have offered to see  her back to continue to follow this and she says she will simply call if  there are any problems.     [name], M.D.  Electronically Signed   CSD/MEDQ  D:  06/25/2010  T:  06/26/2010  Job:  [id number]   cc:   [facility]

## 2011-01-13 NOTE — Op Note (Signed)
[name], JASKO            ACCOUNT NO.:  [id number]   MEDICAL RECORD NO.:  [id number]          PATIENT TYPE:  INP   LOCATION:  6715                         FACILITY:  [facility]   PHYSICIAN:  Juleen China IV, MDDATE OF BIRTH:  [birth year]   DATE OF PROCEDURE:  09/04/2007  DATE OF DISCHARGE:                               OPERATIVE REPORT   PREOPERATIVE DIAGNOSIS:  End-stage renal disease.   POSTOPERATIVE DIAGNOSIS:  End-stage renal disease.   PROCEDURE PERFORMED:  1. Ultrasound-guided access, right internal jugular vein.  2. Right internal jugular vein, Diatek catheter.   ANESTHESIA:  MAC.   COMPLICATIONS:  None.   DESCRIPTION OF PROCEDURE:  The patient was identified in the holding  area and taken to room #6.  She was placed supine on the table.  She was  prepped and draped in a standard sterile fashion.  A time out was  called, antibiotics were given.  The right internal jugular vein was  evaluated with ultrasound and found to be widely patent.  1% lidocaine  was used for local anesthesia.  The right internal jugular vein was  accessed under ultrasound guidance.  An 0.035 wire was advanced into the  right ventricle under fluoroscopic visualization.  Next, a series of  dilators were used to dilate the subcutaneous tract and a peel-away  sheath was placed over the wire under fluoroscopic visualization.  A 32-  cm catheter was selected.  The catheter was then placed to the peel-away  sheath, which was then discarded.  The tip was positioned into the  cavoatrial junction.  A site was selected for the skin exit site and 1%  lidocaine used followed by an incision made with #11 blade.  A  subcutaneous tunnel was then created and then dilated with a dilator  from the kit.  The catheter was brought through the tunnel and the cuff  was situated at the skin exit site.  Fluoroscopy was used to confirm  that the catheter tip was in good position and that there were no kinks  within  the catheter.  Both ports were flushed and aspirated without  difficulty.  Catheter was then sutured into place with a 3-0 nylon.  The  access site of the neck was closed with a 4-0 Vicryl and Dermabond.  The  catheter was filled with appropriate volumes of heparin.  The patient  was taken to the recovery room in stable condition.  There were no  complications.           ______________________________  V. Charlena Cross, MD  Electronically Signed     VWB/MEDQ  D:  09/04/2007  T:  09/05/2007  Job:  [id number] [id number]

## 2011-01-13 NOTE — Op Note (Signed)
[name], [name]            ACCOUNT NO.:  [id number]   MEDICAL RECORD NO.:  [id number]          PATIENT TYPE:  INP   LOCATION:  6712                         FACILITY:  [facility]   PHYSICIAN:  [name], M.D.DATE OF BIRTH:  Mar 19, 1966   DATE OF PROCEDURE:  07/11/2008  DATE OF DISCHARGE:                               OPERATIVE REPORT   PREOPERATIVE DIAGNOSIS:  End-stage renal disease.   POSTOPERATIVE DIAGNOSIS:  End-stage renal disease.   PROCEDURE:  Left thigh arteriovenous graft.   SURGEON:  [name], MD   ASSISTANT:  Wilmon Arms, PA   ANESTHESIA:  General.   TECHNIQUE:  The patient was taken to the operating room and received a  general anesthetic.  Her pannus was taped superiorly.  Because of her  size, it was difficult to feel her femoral pulse.  I was also concerned  about wound healing problems, given her obesity and diabetes.  For this  reason, I elected to make an oblique incision just above the inguinal  crease below her pannus.  Through this incision quite deep, I was able  to identify the common femoral, superficial femoral and deep femoral  artery.  Of note, the bifurcation of the common femoral artery was quite  high and given her size it would have been quite difficult to get up  under the inguinal ligament to sew at the common femoral artery level.  However, at the distal common femoral artery, there was an excellent  pulse and I elected to place the proximal anastomosis here.  The  superficial femoral, common femoral and deep femoral arteries were all  controlled as were two small side branches.  This patient has exhausted  her upper extremity access sources and also has had a right thigh graft  that was later complicated by an acute arterial occlusion.  This is  essentially her last graft site.  I therefore elected to place the  venous anastomosis along the medial aspect of the thigh to allow room  for multiple revisions.   Using a longitudinal incision, the saphenous  vein was dissected free at this level.  I then used one distal  counterincision and tunneled a 4-7-mm graft.  She received 9000 units of  IV heparin.  The common femoral, superficial femoral and deep femoral  arteries were controlled and a longitudinal arteriotomy was made in the  common femoral artery.  A segment of the 4-mm of the graft was excised.  The graft spatulated and sewn end-to-side to the common femoral artery  using continuous 6-0 Prolene suture.  The graft was then pulled to the  appropriate length for anastomosis of the saphenous vein.  The vein was  ligated distally and spatulated proximally.  I easily took a 5-mm  dilator and irrigated nicely with heparinized saline.  The graft was cut  to the appropriate length, spatulated and sewn end-to-end of the vein  using continuous 6-0 Prolene suture.  At the completion, there was an  excellent thrill in the graft.  Hemostasis was obtained in the wounds  and the wounds were closed with deep layer of 3-0  Vicryl, subcutaneous layer with 3-0 Vicryl and the skin closed with 4-0  subcuticular stitch.  Sterile dressing was applied.  The patient  tolerated the procedure well and was transferred to the recovery room in  satisfactory condition.  All needle and sponge counts were correct.      [name], M.D.  Electronically Signed     CSD/MEDQ  D:  07/11/2008  T:  07/12/2008  Job:  [id number]

## 2011-01-13 NOTE — Consult Note (Signed)
NAMEVANESSA, [name]            ACCOUNT NO.:  [id number]   MEDICAL RECORD NO.:  [id number]          PATIENT TYPE:  INP   LOCATION:  6707                         FACILITY:  [facility]   PHYSICIAN:  [name], M.D.DATE OF BIRTH:  Dec 08, 1965   DATE OF CONSULTATION:  06/22/2008  DATE OF DISCHARGE:                                 CONSULTATION   REASON FOR CONSULT:  Ischemic right leg.   HISTORY:  This is a pleasant 45 year old woman with end-stage renal  disease who dialyzes on Tuesdays, Thursdays and Saturdays via a right  thigh AV graft.  The graft was placed in February 2009.  She went for  dialysis yesterday and her graft clotted approximately 2:00 p.m.  She  was set up to go for thrombolysis at [facility] today.  However, when she went home and got out of her car, approximately an  hour later she noted that her right leg was painful and somewhat  discolored.  She has chronic paresthesias in the right leg related to  neuropathy.  Her symptoms persisted and when she presented to [facility] today for thrombolysis it was noted that she had a mottled right  foot and she was sent to [facility] for vascular consultation.   Of note, after her graft was placed in February 2009 she denied any  history of claudication, rest pain, or nonhealing ulcers.  She has had  no previous history of peripheral vascular disease, although her husband  tells me she had a Doppler study a year or two ago which did suggest  that she had some infrainguinal arterial occlusive disease.   PAST MEDICAL HISTORY:  1. Insulin-dependent diabetes.  2. Hypotension.   She denies any history of hypercholesterolemia, history of congestive  heart failure, history of COPD, history of arrhythmias or recent MI.   SOCIAL HISTORY:  She is married, she does not have any children.  She  does not use tobacco.   FAMILY HISTORY:  Father had coronary artery disease in his late 62s.  She is unaware of any other  history of premature cardiovascular disease.   MEDICATIONS:  Are documented on her admission history and physical.   REVIEW OF SYSTEMS:  GENERAL:  She has had no recent weight loss, weight  gain, problems with her appetite.  She is obese.  CARDIAC:  She has had  no chest pain, chest pressure, palpitations or arrhythmias.  PULMONARY:  She has had no recent productive cough, bronchitis, asthma or wheezing.  GI:  She has had no change of bowel habits and no history of peptic  ulcer disease.  GU:  She has had no dysuria.  HEMATOLOGIC:  She has had no clotting  disorders or bleeding problems that she is aware of.  NEURO:  She has  had no dizziness, blackouts, headaches or seizures.  VASCULAR:  She has  had no claudication, rest pain, history of stroke, DVT or phlebitis.  PSYCHIATRIC:  __________ review of systems is unremarkable.   PHYSICAL EXAMINATION:  This is a pleasant 45 year old woman who appears  her stated age.  Her temperature is 98, blood  pressure 103/65, heart  rate is 67.  HEENT:  Neck is supple with no cervical lymphadenopathy.  I did not  detect any carotid bruits.  LUNGS:  Clear bilaterally to auscultation.  CARDIAC:  She has a regular rate and rhythm.  ABDOMEN:  Obese and difficult to assess for an aneurysm.  She has palpable femoral pulses.  Her right thigh graft is clotted.  I  can not palpate a popliteal or pedal pulses in the right foot.  She has  no Doppler flow in the right foot and the right foot is cool and  mottled.  On the left side I can not palpate popliteal or pedal pulses  although she has a monophasic dorsalis pedis, posterior tibial and  peroneal signal to Doppler.  She has mild bilateral extremity swelling.  NEUROLOGIC:  Exam is nonfocal except for paresthesias in the lower  extremities bilaterally.   IMPRESSION:  This patient presents with an acute arterial occlusion.  She has no Doppler flow in the right foot and I have explained that I  think this is  a limb threatening situation.  This likely began at 3  o'clock yesterday.  She has no good reason to have embolized.  Based on  a previous Doppler study done elsewhere her husband think she did have  evidence of infrainguinal arterial occlusive disease, although she did  not give any symptoms to suggest underlying peripheral vascular disease.  It is possible that when her graft clotted some of the clot propagated  into the native artery and this embolized.   PLAN:  To proceed urgently with thrombectomy of her graft and femoral  thrombectomy with intraoperative arteriogram.  I have explained that  there is a chance we would have to do a bypass or if we had to sacrifice  her graft to preserve flow to her foot that she could require a Diatek  catheter.  She understands this is a limb threatening situation.  We  have discussed the potential complications of surgery including, but not  limited to, wound healing problems, bleeding and infection.  All of her  questions were answered and she is agreeable to proceed.      [name], M.D.  Electronically Signed     CSD/MEDQ  D:  06/22/2008  T:  06/22/2008  Job:  [id number]

## 2011-01-13 NOTE — Op Note (Signed)
NAMEMARGERITE, [name]            ACCOUNT NO.:  [id number]   MEDICAL RECORD NO.:  [id number]          PATIENT TYPE:  INP   LOCATION:  3040                         FACILITY:  [facility]   PHYSICIAN:  Juleen China IV, MDDATE OF BIRTH:  March 05, 1966   DATE OF PROCEDURE:  09/08/2007  DATE OF DISCHARGE:                               OPERATIVE REPORT   PREOPERATIVE DIAGNOSIS:  End-stage renal disease.   POSTOPERATIVE DIAGNOSIS:  End-stage renal disease.   PROCEDURE PERFORMED:  Right thigh loop graft.   TYPE OF ANESTHESIA:  General.   ESTIMATED BLOOD LOSS:  100 mL.   FINDINGS:  Arterial anastomosis to the superficial femoral artery,  venous anastomosis to the greater saphenous vein just distal to the  saphenofemoral junction; a 6-mm graft was used.   DESCRIPTION OF PROCEDURE:  The patient was identified in the holding  area and taken to room #6.  She was placed supine on the table.  General  endotracheal anesthesia was administered.  The patient was prepped and  draped in a standard sterile fashion.  A time-out was called and  perioperative antibiotics were administered.  Ultrasound was used to  identify the saphenofemoral junction as well as the bifurcation of the  femoral artery.  Once this was done, a longitudinal incision was made  between the artery and the vein.  Bovie cautery was used to dissect the  subcutaneous tissue.  Weitlaner retractor was used for exposure.  First  identified was the greater saphenous vein.  It was encircled with a  vessel loop and dissected down to its junction with the common femoral  vein.  There were 2 cephalad branches that were divided between 3-0 silk  ties.  Once the greater saphenous vein had been adequately mobilized, I  identified the superficial femoral artery.  This was dissected back up  to the common femoral artery, and the profunda femoral artery was also  identified.  The superficial femoral artery was then mobilized for an  appropriate  distance.  Next, the tunnel was created and 2 counter  incisions were made in the thigh.  A 6-mm stretch Gore-Tex graft was  brought to the tunnel.  At this point in time, the patient was  systemically heparinized.  Next, serrefine clamps were used to occlude  the superficial femoral artery.  This was opened with a #15 blade and  extended with Potts scissors.  The 6-mm graft was then appropriately  beveled to match the size of the arteriotomy.  The anastomosis was  completed using a running 6-0 Prolene.  Of note, the artery was not  heavily calcified.  However, the intima was very thin.  A special  attention was taken to make sure that there were intimal flaps during  the creation of anastomosis.  Once the anastomosis was completed, the  distal clamp on the superficial femoral artery was released first and  then replaced followed by release of the proximal clamp.  There was  brisk bleeding out through the graft.  The distal clamp on the  superficial femoral artery was then released.  The graft was flushed  with heparinized  saline and the re-occluded.  Next, the greater  saphenous vein was occluded proximally and distally with vascular  clamps.  A longitudinal venotomy was made with #15 blade.  This was  extended with Potts scissors.  The graft was then beveled and an end-to-  side anastomosis was performed to the proximal greater saphenous vein.  This was done using a running 6-0 Prolene in an end-to-side fashion.  Prior to completion of anastomosis, the graft was flushed.  Anastomosis  was then secured.  There was good hemostasis at this time.  Hand-held  Doppler was used to evaluate flow within the common femoral vein, which  had great signal.  There was also multiphasic signal proximal and distal  to the arteriotomy.  The graft also had a good palpable thrill within  it.  At this point, I elected to close.  The wound with copious  irrigated.  The tissues were then reapproximated with  multiple layers of  interrupted 3-0  Vicryl.  The skin was closed with running 4-0 Vicryl.  Dermabond was  then placed.  The Doppler was used to evaluate the dorsalis pedis artery  in the foot and this had a multiphasic signal.  The patient was  successfully extubated and taken to the recovery room in a stable  condition.           ______________________________  V. Tonya Cross, MD  Electronically Signed     VWB/MEDQ  D:  09/08/2007  T:  09/09/2007  Job:  [id number]

## 2011-01-13 NOTE — Assessment & Plan Note (Signed)
OFFICE VISIT   [name], [name]  DOB:  1966-07-22                                       10/14/2007  CHART#:[id number]   The patient presents today for evaluation of erythema on her old  nonfunctional left forearm loop graft.  Also concern regarding healing  of a recently placed right loop thigh graft.  She is a 45 year old white  female who has a long history of end-stage renal disease.  She has had  multiple bilateral lower extremity grafts.  She had a left forearm loop  graft, which according to the patient, was placed around 2005 or 2006  and was never functional.  She has an area of erythema on the ulnar  aspect, towards the antecubital space with some erythema and  flocculence.  She reports she has been on antibiotics for this, and this  continues to wax and wane, and remains tender.  This does extend up  towards what appears to be an extension up towards the basilic vein and  the above elbow position.  She also was concerned regarding an area of  slow-healing on her right groin.  She had a right groin loop graft  placed by Dr. Durene Cal on 09/08/2007.  This actually looks good and  she began using it for the first time yesterday.  She does dialyze on  Tuesday, Thursday, and Saturday.  She has a small area of skin necrosis,  and this was debrided in the office today, but does not appear to be a  deep infection.  I discussed this with the patient and her husband.  I  explained that she does need to have removal of this segment of her  graft.  I explained that the decision will be made at the time of  surgery as to remove a segment of the graft up to the venous  anastomosis, versus removal of the entire forearm loop graft, if it is  involved.  She has requested that either Dr. Myra Gianotti or myself do this.  I explained that I do not have any operative time available next week,  and therefore she is scheduled for Dr. Myra Gianotti on Friday, 02/20 as an  outpatient at [facility].  She does request general anesthesia.   Larina Earthly, M.D.  Electronically Signed   TFE/MEDQ  D:  10/14/2007  T:  10/17/2007  Job:  [id number]   cc:   Jorge Ny, MD  [facility]

## 2011-01-13 NOTE — Discharge Summary (Signed)
[name], [name]            ACCOUNT NO.:  [id number]   MEDICAL RECORD NO.:  [id number]          PATIENT TYPE:  INP   LOCATION:  6739                         FACILITY:  [facility]   PHYSICIAN:  Theodosia Paling, MD    DATE OF BIRTH:  1965/12/14   DATE OF ADMISSION:  06/21/2008  DATE OF DISCHARGE:  06/26/2008                               DISCHARGE SUMMARY   PRIMARY CARE PHYSICIAN:  The patient actually follows up with Dr.  Edilia Bo as the primary care, which is [facility].   ADMISSION HISTORY:  Please refer to the note dictated by Dr. Ladell Pier for admission history dictated under history of present illness.   DISCHARGE DIAGNOSES:  1. Ischemic right leg, status post thrombectomy of the graft and      femoral thrombectomy.  2. End-stage renal failure.  3. Diabetes.  4. Gastroparesis.  5. Chronic hypertension.   DISCHARGE MEDICATIONS:  1. Aspirin enteric coated 81 mg p.o. daily.  2. Protonix 40 mg p.o. daily.  3. MiraLax 17 g once a day.  4. Insulin 70/30 40 units subcu at bedtime.  5. NovoLog sliding scale insulin.  6. Lyrica 50 mg p.o. q.12 h.  7. PhosLo 667 mg p.o. q.8 h.  8. Nephro-Vite once a night 1 tablet p.o. daily.  9. Midodrine 10 mg p.o. q.8 h.  10.Oracea 40 mg p.o. daily.  11.Epidural gel as directed.   HOSPITAL COURSE:  The following issues were addressed during the  hospitalization.  1. Ischemic right leg.  The patient had acute arterial occlusion.  She      had no Doppler flow in the right foot, which was a limb threatening      situation.  Vascular consult was done.  The patient underwent      urgent thrombectomy of the graft and femoral thrombectomy with      intraoperative arteriogram.  Her pulse has returned and at the time      of discharge she has good motor function and intact pulses.  The      patient will be following as an outpatient with Vascular Surgery.  2. End-stage renal disease.  The patient continued with her  hemodialysis while she was admitted in the hospital.  3. Diabetes.  Insulin was continued.   DISPOSITION:  1. The patient will follow up with Dr. Edilia Bo in 2 weeks' time.  Her      office number is [phone].  2. The patient will follow up with [facility] for her      end-stage renal disease.  3. The patient will follow up with the primary care physician in 1      week time as well.   PROCEDURES PERFORMED:  1. Right femoral thrombectomy.  2. Right femoral endarterectomy.  3. Vein patch angioplasty of right femoral artery.  4. Intraoperative arteriogram.   CONSULTATIONS PERFORMED:  1. [name], MD, from Vascular Surgery.  2. [facility] followed the patient while she was      admitted to the hospital.   IMAGING PERFORMED:  1. Arteriogram showing distal occlusion of the  runoff vessel done on      June 22, 2008, showing distal superficial femoral artery and      popliteal artery to be patent, flow in proximal right anterior      tibial and distal aspect of the vessel was difficult to visualize,      mild filling defect medial geniculate branch.  2. Chest x-ray done on June 22, 2008, showing status post Diatek      catheter insertion.  No pneumothorax or acute cardiopulmonary      disorder.   Total time spent in discharge of this patient was 45 minutes at least.      Theodosia Paling, MD  Electronically Signed     NP/MEDQ  D:  06/26/2008  T:  06/27/2008  Job:  [id number]   cc:   [name], M.D.  [facility]

## 2011-01-13 NOTE — H&P (Signed)
[name], [name]            ACCOUNT NO.:  [id number]   MEDICAL RECORD NO.:  [id number]          PATIENT TYPE:  INP   LOCATION:  6715                         FACILITY:  [facility]   PHYSICIAN:  Maree Krabbe, M.D.DATE OF BIRTH:  Aug 18, 1966   DATE OF ADMISSION:  09/03/2007  DATE OF DISCHARGE:                              HISTORY & PHYSICAL   REASON FOR ADMISSION:  Clotted graft hyperkalemia.   HISTORY OF PRESENT ILLNESS:  The patient is a of 45 year old white  female with end-stage renal disease and type 1 diabetes.  She has  multiple failed accesses in both upper extremities.  She was told by her  vascular surgeon in [place] that she needed leg graft, but did not  want to have a right graft and found a Physicist, medical who  would put a right upper arm graft in about a month ago.  Apparently she  had has had previous accesses in that area which have failed.  She was  then admitted here about a week and a half ago, on December 25, with  catheter sepsis due to coag negative staph.  The catheter was removed  and the right arm AV graft was successfully used.  She was discharged  home about 6 days ago.  She had outpatient dialysis two times this week  and presented to dialysis with the graft being clotted.  She underwent  successful thrombolysis by Dr. Lowella Dandy today with Interventional Radiology,  and she was brought to dialysis where we were unable to cannulate the  graft.  There was blood clots returning, and no bruit and the graft is  again clotted.  The patient has no specific complaints.   PAST MEDICAL HISTORY:  1. Diabetes 1.  2. Gastroparesis.  3. Chronic hypotension.  4. Emesis.  5. Catheter sepsis in August 2008.  6. Coag negative staph.  7. Catheter sepsis in December 2008.  8. Right partial foot amputation.   MEDICATIONS:  1. Renavite one a day.  2. PhosLo four t.i.d. four tablets with each meal.  3. Lyrica 50 b.i.d.  4. Lantus 60 units at night.  5.  Midodrine 10 p.o. t.i.d.   For allergies and social history see previous admissions.   REVIEW OF SYSTEMS:  No fever, chills, sweats, nausea, vomiting,  diarrhea, chest pain, shortness of breath and cough.  No dysuria.  No  ankle swelling.  No focal neurologic complaints.   PHYSICAL EXAMINATION:  VITAL SIGNS:  Blood pressure 120/70, temperature  98.5 pulses 60, respirations 20, O sat 100% on room air.  GENERAL:  This an obese pleasant white female in no distress.  SKIN:  Without rash.  HEENT: PERRLA.  EOMI.  Throat is clear.  NECK:  Supple without JVD.  CHEST:  Clear throughout bilaterally.  CARDIAC:  Regular rhythm without murmur, rub or gallop.  ABDOMEN:  Soft, nontender, obese, no masses.  EXTREMITIES:  She has a right partial foot amputation of the first and  second toes on the right foot.  NEUROLOGIC:  Alert and oriented x3.   LABORATORY DATA:  Potassium was 5.5.  This was the only  labs we have  that was from 2:50 today.  Catheter tip culture from December 26 was  coag with staph aureus and methicillin-sensitive staph aureus and coag  negative staph.  The coag negative staph was resistant oxacillin, and  she was treated with vancomycin, but has been treated with vancomycin 1  gram with each treatment.   IMPRESSION:  1. Blood clot, right upper arm graft, reclotted after earlier      thrombolysis today.  2. ESRD dialysis Tuesday, Thursday, Saturday.  3. Diabetes mellitus type 1, gastroparesis neuropathy.  4. Chronic hypotension on midodrine.  5. Recent MSSA/coag negative staph catheter sepsis on vancomycin 1      gram each dialysis.   1. Mild hyperkalemia.   PLAN:  Admit, renal diet, Kayexalate 15 grams, check labs today and in  morning.  VVS Has consult for Diatek catheter and evaluation for the  next permanent access.  She will probably need a leg graft which was  previously recommended by the vascular surgeons here in [place].      Maree Krabbe, M.D.   Electronically Signed     RDS/MEDQ  D:  09/03/2007  T:  09/04/2007  Job:  [id number]

## 2011-01-13 NOTE — Op Note (Signed)
[name], [name]            ACCOUNT NO.:  [id number]   MEDICAL RECORD NO.:  [id number]          PATIENT TYPE:  AMB   LOCATION:  SDS                          FACILITY:  [facility]   PHYSICIAN:  Janetta Hora. Fields, MD  DATE OF BIRTH:  04-09-1966   DATE OF PROCEDURE:  02/16/2007  DATE OF DISCHARGE:  02/16/2007                               OPERATIVE REPORT   PROCEDURE:  1. Ultrasound of neck.  2. Attempted right internal jugular vein Diatek catheter.  3. Central venogram.  4. Placement of left internal jugular vein Diatek catheter.   PREOPERATIVE DIAGNOSIS:  End stage renal disease.   POSTOPERATIVE DIAGNOSIS:  End stage renal disease.   ANESTHESIA:  Local with IV sedation.   SURGEON:  Janetta Hora. Fields, M.D.   ASSISTANT:  Nurse   OPERATIVE FINDINGS:  1. 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  2. 27 cm catheter left internal jugular vein.   OPERATIVE DETAILS:  After obtaining informed consent, the patient was  taken to the operating room.  The patient was placed in the supine  position on the operating table.  After adequate sedation, the patient's  right neck was inspected with an ultrasound.  The right internal jugular  vein was found to be normal in compression and respiratory variation.  Next, the patient's entire neck and chest were prepped and draped in the  usual sterile fashion.  Local anesthesia was infiltrated over the right  internal jugular vein.  A larger introducer needle was used to cannulate  the right internal jugular vein using ultrasound guidance.  A 0.035 J-  tip guidewire was then threaded through the needle into the right  internal jugular vein.  However, this would not advance easily.  This  was inspected under fluoroscopy and the guidewire was not advancing down  in the chest but instead advancing up into the more distal right  internal jugular vein.  Next, Omnipaque was brought on the operative  field.  A contrast injection was performed through  the right internal  jugular vein.  This showed a high grade stenosis of approximately 80% of  the distal right internal jugular vein.  A 0.035 Glidewire was then  brought up on the operative field.  An attempt was made to advance this  Glidewire across the stenosis but this was unsuccessful.  At this point,  attempts were aborted on the right side.   Attention was then turned to the left neck.  The patient's left neck was  inspected with ultrasound and the left internal jugular vein was easily  compressible with normal respiratory variation.  Next, local anesthesia  was infiltrated over the left internal jugular vein.  An introducer  needle was used to successfully cannulate the left internal jugular vein  under ultrasound guidance.  A 0.035 J-tip guidewire was then easily  advanced through the left internal jugular vein, innominate vein, and  down into the inferior vena cava under fluoroscopic guidance.  Next,  sequential 12, 14, and 16 French dilators with a peel away sheath were  placed over the guidewire into the right atrium.  The dilator was  removed from the peel away sheath.  A 27 cm Diatek catheter was then  placed using a weave technique over the wire through the peel away  sheath.  The guidewire was then removed and the peel away sheath also  removed.  The catheter tip was inspected and found to be in the right  atrium.  The catheter was then tunneled subcutaneously, cut to length,  and the hub attached.  The catheter was inspected under fluoroscopy and  found the tip to be in the right atrium with no kinks throughout its  course.  The catheter was sutured to the skin with nylon sutures.  The  neck insertion site was closed with a Vicryl stitch.  The catheter was  noted to flush and draw easily.  The catheter was loaded with  concentrated heparin solution.  The patient tolerated the procedure well  and there were no complications.  Instrument, sponge, and needle counts  were  correct at the end of the case.  The patient was taken to the  recovery room in stable condition.      Janetta Hora. Fields, MD  Electronically Signed     CEF/MEDQ  D:  02/16/2007  T:  02/16/2007  Job:  [id number]

## 2011-01-13 NOTE — Op Note (Signed)
NAMEBLAKE, GOYA            ACCOUNT NO.:  [id number]   MEDICAL RECORD NO.:  [id number]          PATIENT TYPE:  AMB   LOCATION:  XRAY                         [facility]ACILITY:  [facility]   PHYSICIAN:  [name], M.D.DATE OF BIRTH:  Jan 28, 1966   DATE OF PROCEDURE:  06/22/2008  DATE OF DISCHARGE:                               OPERATIVE REPORT   PREOPERATIVE DIAGNOSIS:  Ischemic right lower extremity.   POSTOPERATIVE DIAGNOSIS:  Ischemic right lower extremity.   PROCEDURES:  1. Right femoral thrombectomy.  2. Right femoral endarterectomy.  3. Vein patch angioplasty of the right femoral artery.  4. Intraoperative arteriogram.  5. Ultrasound-guided placement of a left IJ Diatek catheter (32 cm).   FINDINGS:  There was dissection and plaque in the right common femoral  artery adjacent to the anastomosis that had extended into the native  artery from the clotted AV graft and there was extensive clot throughout  the femoral-popliteal system.  Completion arteriogram showed two-vessel  runoff via the peroneal and posterior tibial artery.   INDICATIONS:  This is a 45 year old woman, who undergone dialysis  yesterday via right thigh AV graft.  The graft clotted at the end of  dialysis.  She went home and began having significant right leg pain.  Her pain persisted.  She was sent to [facility] for thrombolysis of her  graft and was noted that she had a mottled foot, so she was sent for  [facility] to be admitted and for Vascular consultation.  She had no Doppler  flow in the right foot with a mottled cold foot and was taken urgently  to the operating room for attempted revascularization and possible  salvage of her graft.   TECHNIQUE:  The patient was taken to the operating room and the right  groin and right lower extremity were prepped and draped in usual sterile  fashion.  Previous incision in the right groin was opened.  Through  dense scar tissue, I dissected free arterial limb of  the graft down to  the anastomosis to the common femoral artery which was adjacent to the  takeoff of the deep femoral artery.  There was again dense scar tissue  and the dissection was quite difficult.  Also, I dissected the venous  limb of the graft.  The graft was divided.  There were two areas in the  graft where the catheter hung up and actually in one area, it would poke  through the skin.  I was assuming there was a pseudoaneurysm in this  area.  I was able to control the common femoral, deep femoral, and  superficial femoral arteries and the patient was heparinized.  The graft  was then taken off of the artery and upon inspection, it was clear that  the clot had extended into the native artery from the thrombosed graft.  The clot in this area was removed and there was plaque here, with  dissection this was endarterectomized.  I then passed three and four  Fogarty down the entire length of the superficial femoral artery from  the groin and a large amount of thrombus was retrieved  and no further  clot was retrieved.  The artery was flushed with saline.  Catheter was  passed proximally.  There was no clot proximally.  There was no clot in  the deep femoral artery.  Next, I felt that it would be contraindicated  to revise her graft given the risk of ischemia and therefore, I elected  to close the arteriotomy with the vein patch.  The segment of the  saphenous vein was harvested where the venous anastomosis had been and  this was spatulated and used as a vein patch and sewn using continuous 6-  0 Prolene suture.  At the completion, there was a good peroneal and  posterior tibial signal at the Doppler.  Completion arteriogram was  obtained which showed no residual clot.  Hemostasis was obtained in this  wound.  A #15 Blake drain was placed in the groin incision.  The groin  incision was closed with two deep layers of 3-0 Vicryl after the  hemostasis was obtained and the skin was closed  with a 4-0 subcuticular  stitch.  Next, attention was turned to placement of a Diatek catheter.  The ultrasound scanner was used to identify the left IJ, the right IJ  appeared quite small.  The neck and upper chest were prepped and draped  in usual sterile fashion.  Under ultrasound guidance, the left IJ was  cannulated and a guidewire introduced in the right atrium under  fluoroscopic control.  The tract over the wire was dilated and the  dilator and peel-away sheath advanced over the wire and dilator removed.  The catheter was threaded over the wire through the peel-away sheath  down into the right atrium and the peel-away sheath and wire were  removed.  The exit site for the catheter was selected.  The skin  anesthetized between the two areas.  The catheter was then brought  through the tunnel, cut to the appropriate length and the distal ports  were attached.  Both ports withdrew easily.  We then flushed with  heparinized saline and filled with concentrated heparin.  The catheter  was secured at its exit site with 3-0 nylon suture.  The IJ cannulation  site was closed with 4-0 subcuticular stitch.  Sterile dressing was  applied.  The patient tolerated the procedure well, was transferred to  the recovery room in satisfactory condition.  All needle and sponge  counts were correct.      [name], M.D.  Electronically Signed     CSD/MEDQ  D:  06/22/2008  T:  06/23/2008  Job:  [id number]

## 2011-01-13 NOTE — Assessment & Plan Note (Signed)
OFFICE VISIT   [name], [name]  DOB:  04-28-1966                                       04/06/2007  CHART#:[id number]   [name] presents today for evaluation for placement of a thigh  graft.  He has used up all of her upper extremity access sites.  She has  currently been dialyzing via a Diatek catheter since March 2008.  She  dialyzes on Tuesday, Thursday, Saturday.  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 She has  previously had an operation on her left knee.   PAST MEDICAL HISTORY:  1. Diabetes.  2. Hypertension.   She is a nonsmoker.   MEDICATIONS:  1. PhosLo 660 mg with each meal.  2. Sensipar 30 mg once a day.  3. Nephrovite once a day.  4. Lyrica two 50 mg tablets daily.  5. Insulin 80 units at night.   ALLERGIES:  She has a reaction to MORPHINE although she does not know  what it is.  Apparently it was a fairly severe reaction.  She was told  not to take it anymore.   PHYSICAL EXAMINATION:  VITAL SIGNS:  Blood pressure 114/82 in the left  arm, heart rate 78 and regular.  GENERAL APPEARANCE:  She is obese.  She has 2+ femoral pulses and 1+ pedal pulses bilaterally.  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   I believe the best option for [name] will be placement of a  right thigh AV graft.  She is requesting that the right leg would be the  best option for her as well.  She is also requesting general anesthesia  for the procedure.  We will schedule this for her on May 16, 2007.  She had several events coming up that she did not want to miss due to an  operation so she will continue to use her catheter in the meanwhile.  I  informed her today of the risks, benefits, possible complications and  procedure details.  I also informed her she will be admitted to the  hospital overnight.  She understands and agrees to proceed.   Janetta Hora. Fields, MD  Electronically Signed   CEF/MEDQ  D:  04/06/2007  T:  04/07/2007  Job:  [id number]

## 2011-01-13 NOTE — Procedures (Signed)
VASCULAR LAB EXAM   INDICATION:  Evaluate right basilic AV fistula.   HISTORY:  Diabetes:  Yes.  Cardiac:  No.  Hypertension:  No.   EXAM:  Duplex of right basilic vein transposition.   IMPRESSION:  1. The right transposition basilic vein appears patent proximally to      the shoulder level.  2. There does appear to be elevated velocities of 148 cm/s at the      shoulder level.   ___________________________________________  [name], M.D.   CB/MEDQ  D:  04/10/2010  T:  04/10/2010  Job:  [id number]

## 2011-01-16 NOTE — Discharge Summary (Signed)
NAME:  [name], [name]           ACCOUNT NO.:  [id number]   MEDICAL RECORD NO.:  [id number]          PATIENT TYPE:  INP   LOCATION:  5523                         FACILITY:  [facility]   PHYSICIAN:  Maree Krabbe, M.D.DATE OF BIRTH:  [birth year]   DATE OF ADMISSION:  09/12/2004  DATE OF DISCHARGE:  09/20/2004                                 DISCHARGE SUMMARY   ADMISSION DIAGNOSES:  1.  Left abdominal pain with nausea, vomiting, and fevers.  2.  End-stage renal disease on chronic hemodialysis.  3.  Recent left lower lobe pneumonia, December 2005.  4.  Recent acute pancreatitis, December 2005.  5.  Insulin-dependent diabetes mellitus, type 2.  6.  Anemia of chronic disease.  7.  Secondary hyperparathyroidism.   DISCHARGE DIAGNOSES:  1.  Left pleural effusion with possible recurrent left lower lobe, status      post thoracentesis.  2.  Large left ovary at 7.5 cm with two noncommunicating left ovarian cysts      measuring 3.2 and 3.5 cm.  3.  Insulin-dependent diabetes mellitus, type 2.  4.  Anemia of chronic disease.  5.  Secondary hyperparathyroidism.   BRIEF HISTORY:  This 45 year old white female with end-stage renal disease  secondary to diabetic nephropathy on chronic dialysis on Monday, Wednesday,  and Friday through [facility] presents with a gradual increase  in abdominal pain over the last two to three weeks in the left upper  quadrant. She has had nausea and vomiting for the last two to three days,  some diarrhea, and states she had bright red blood per rectum times one. She  was told in December 2005 at the [facility] that she had left  lower lobe pneumonia and pancreatitis. Her pneumonia resolved with  antibiotics. She had fever this week at the last hemodialysis. Blood  cultures were drawn, she was given IV antibiotics. On the day of admission  she presents with febrile illness and was admitted for workup.   Labs on admission revealed  white count of 7400 with 71 segs, 13 lymphocytes,  11 monocytes, 5 eosinophils, 0 basophils. Hemoglobin 9.4, hematocrit 28.3,  platelet count 189,000. Sodium 140, potassium 3.2, chloride 98, CO2 30,  glucose 324, BUN 12, creatinine 2.7, calcium 8.7, albumin 2.5. LFTs within  normal limits. Total bilirubin 1.5. Phosphorus 3.0.   Chest x-ray shows left pleural effusion with left basilar pulmonary  atelectasis, small pericardial effusion.   HOSPITAL COURSE:   PROBLEM #1:  Left pleural effusion. Blood cultures were drawn and the  patient was empirically placed on Zosyn. She underwent a thoracentesis of  her left pleural effusion which was thin bloody fluid. Analysis showed  exudative fluid with 1400 white blood cells with a differential of 35%  neutrophils, 27% lymphocytes, 29% monocytes, 9% eosinophilia. LDH was 207.  Amylase 17. Glucose 325. Protein 3.5 on the pleural fluid. Pathology report  was only reactive mesothelial cells present. Culture on the pleural fluid  remained negative. Over the next 48 to 72 hours the patient's fever  defervesced. Her white count remained within normal limits. On the 19th of  January, her  Zosyn was stopped and she was started on Avelox. Overall, she  has improved to baseline. She still complains of a left upper abdominal,  left flank discomfort. Other diagnostic studies were a CAT scan of the  abdomen and pelvis. Findings were that of left pleural effusion with  pulmonary atelectasis, small pericardial effusion, and atrophy of the  pancreas. She had a large lobulated cystic area in the left adnexa measuring  7.6 cm fluid density. The uterus was within normal limits. The bladder was  decompressed and was negative for adenopathy. It was felt that her left  pleural effusion was probably reactive to a recurrent left lower lobe  pneumonia. The chest x-ray with decubitus on day of discharge shows a small  area of possibly loculated effusion. She will go home to  complete ten more  days of Avelox. She will have a follow-up PA and lateral and left decubitus  chest x-ray on January 30th post dialysis as an outpatient.   PROBLEM #2:  Large left ovary. The patient has been followed by Dr. Richardean Sale as an outpatient and underwent a recent sonohistogram in her office.  The patient was told that she had a cyst on her left ovary of approximately  3 cm. The pelvic CAT scan findings were followed up with a pelvic ultrasound  with findings of a large left ovary measuring 7.5 cm with two  noncommunicating cysts on the left ovary measuring 3.2  and 3.5 cm. This  does not appear to be a complex cyst or an ovarian abscess. Findings were  discussed with Dr. Annabell Howells who felt comfortable with discharging the patient.  She stated that she will have her office arrange for a follow-up pelvic  ultrasound in approximately one month. She has requested that the patient  return for follow up with Dr. Annabell Howells having radiologic films which were done  here at [facility] during this hospitalization with her so that she can  review and compare.   PROBLEM #3:  Insulin-dependent diabetes mellitus. Due to elevated blood  sugars as high as 300, her b.i.d. Lantus was increased to 34 units b.i.d.  She was given a prescription for NovoLog pens sliding scale insulin.   PROBLEM #4:  End-stage renal disease. The patient's fluid was challenged on  dialysis and we were able to take her weight down based on a small  pericardial effusion and some vascular congestion on chest x-ray. Her new  dry weight will be 103 kg.   DISCHARGE MEDICATIONS:  1.  Lantus 34 units b.i.d.  2.  Prilosec 20 mg at bedtime.  3.  Tums 500 mg, two with meals.  4.  Nephro-Vite one daily.  5.  Avelox 400 mg one every evening for ten more days.   New dry weight 103 kg.   FOLLOWUP:  Dr. Annabell Howells for a repeat pelvic ultrasound in one month; at [facility] Radiology Department for a PA and lateral, left decubitus chest  x-ray post  dialysis on Monday, September 29, 2004.   OTHER MEDICATIONS:  1.  InFeD 100 mg IV every Wednesday at dialysis.  2.  Hectorol 3 mcg IV at each dialysis.  3.  EPO 15,000 units IV each dialysis.      RRK/MEDQ  D:  09/20/2004  T:  09/20/2004  Job:  [id number]   cc:   Richardean Sale, M.D.  [address]  [place]  [place] [address]  Fax: [phone]   [facility]

## 2011-01-16 NOTE — Op Note (Signed)
[name], [name]            ACCOUNT NO.:  [id number]   MEDICAL RECORD NO.:  [id number]          PATIENT TYPE:  INP   LOCATION:  5503                         FACILITY:  [facility]   PHYSICIAN:  Janetta Hora. Fields, MD  DATE OF BIRTH:  Oct 19, 1965   DATE OF PROCEDURE:  11/08/2006  DATE OF DISCHARGE:                               OPERATIVE REPORT   PROCEDURE:  1. Ultrasound of neck.  2. Insertion of right internal jugular vein Diatek catheter.  3. Placement of right upper arm AV graft.   PREOPERATIVE DIAGNOSIS:  End-stage renal disease.   POSTOPERATIVE DIAGNOSIS:  End-stage renal disease.   ANESTHESIA:  General.   ASSISTANT:  Constance Holster, PA   OPERATIVE FINDINGS:  1. 4-mm deep brachial vein.  2. 6-mm PTFE.  3. 32-cm Diatek catheter.   OPERATIVE DETAIL:  After obtaining informed consent, the patient taken  the operating room.  The patient placed supine position operating table.  After induction of general anesthesia endotracheal intubation, the  patient's neck was inspected with ultrasound.  The right and left  internal jugular veins were found to be widely patent with normal  compressibility and respiratory variation.  Next, the patient's entire  neck and chest were prepped and draped usual sterile fashion.  A small  finder needle was used to localize right internal jugular vein.  A  larger introducer needle was then used to cannulate the right internal  jugular vein and a 0.035 J-tip guide wire threaded through the right  internal jugular vein down the inferior vena cava under fluoroscopic  guidance.  Next sequential 12, 14, 16-French dilators were placed over  the guidewire into the right atrium.  The guidewire and dilator was  removed and a 32 cm Diatek catheter was placed through the peel-away  sheath down in the right atrium.  Peel-away sheath was then removed.  Catheter was tunneled subcutaneously, cut to length and hub attached.  Catheter was inspected under  fluoroscopy and found to have its tip in  the right atrium.  There were no kinks throughout its course.  Catheter  was sutured to skin with nylon sutures.  The neck entry site was closed  with Vicryl stitch.  Catheter was noted to flush and draw easily.  The  catheter was loaded with concentrated heparin solution.  At this point  attention was turned to the patient's right arm.  The patient was fairly  obese and her pannus and breast had to be taped up out of the field.  Next the patient's entire right upper extremity prepped and draped usual  sterile fashion.  The patient had had a previous brachiocephalic fistula  which had occluded.  A longitudinal incision was made adjacent to the  preexisting transverse scar near the antecubital crease.  Incision was  carried down through subcutaneous tissues down to level of brachial  artery.  This was dissected free circumferentially.  During the course  of dissection a portion of the fistula was entered and this was repaired  with several 5-0 Prolene sutures.  The adjacent deep brachial veins next  to the artery were quite small,  both less than 2 mm in diameter.  Therefore an additional longitudinal incision was made approximately 5  cm above this.  The deep brachial vein was explored through this  incision.  It was also small in character.  At this point a recent upper  arm incision near the axilla was reopened.  In this incision, a PTFE  graft from the revision of the fistula was dissected free  circumferentially.  This had been anastomosed to a deep upper arm vein  which was approximately 3.5 to 4 mm in diameter.  This was dissected  free circumferentially.  Next the graft was transected just above the  level of the venous anastomosis.  This was thrombectomized with a number  4 Fogarty catheter.  There was good venous backbleeding.  This was  controlled with fine bulldog clamps.  A subcutaneous tunnel was then  created in loop configuration  connecting the upper arm incision to lower  arm incision.  The patient was then given 5000 units of intravenous  heparin.  A 6-mm PTFE graft was brought through the subcutaneous tunnel.  The artery was controlled proximally and distally with vessel loops.  Longitudinal arteriotomy was made.  The graft slightly beveled.  The  graft was then sewn end of graft to side of artery using a running 6-0  Prolene suture.  Just prior completion anastomosis this fore bled and  back bled and thoroughly flushed.  Anastomosis was secured.  Clamp was  moved just above the anastomosis.  The graft was pulled taut to length  and sewn end-to-end to the preexisting graft on the vein using a running  6-0 Prolene suture.  Just prior to completion this was fore bled, back  bled and thoroughly flushed.  Anastomosis was secured, clamps released  there was a palpable thrill in the proximal portion of the graft  immediately.  Doppler flow in the radial artery augmented slightly with  graft compression.  Next hemostasis was obtained in all incisions with  thrombin and Gelfoam.  Subcutaneous layers and deep layers of the wounds  were all closed with running 2-0 and 3-0 Vicryl suture.  Skin of both  incisions was closed with 4-0 Vicryl subcuticular stitch.  The patient  tolerated procedure well and complications.  Instrument, sponge and  needle counts correct at end the case.  The patient extubated in the  operating room, taken to recovery room in stable condition.      Janetta Hora. Fields, MD  Electronically Signed     CEF/MEDQ  D:  11/09/2006  T:  11/09/2006  Job:  [id number]

## 2011-01-16 NOTE — Op Note (Signed)
NAME:  [name], [name]           ACCOUNT NO.:  [id number]   MEDICAL RECORD NO.:  [id number]          PATIENT TYPE:  AMB   LOCATION:  SDS                          FACILITY:  [facility]   PHYSICIAN:  Larina Earthly, M.D.    DATE OF BIRTH:  02/04/1966   DATE OF PROCEDURE:  07/14/2005  DATE OF DISCHARGE:  07/14/2005                                 OPERATIVE REPORT   PREOPERATIVE DIAGNOSIS:  End-stage renal disease.   POSTOPERATIVE DIAGNOSIS:  End-stage renal disease.   OPERATION PERFORMED:  1.  Placement of new left upper arm arteriovenous Gore-Tex graft.  2.  Right IJ Diatek catheter with ultrasound visualization.   SURGEON:  Larina Earthly, M.D.   ASSISTANT:  Jerold Coombe, P.A.   ANESTHESIA:  LMA.   COMPLICATIONS:  None.   DISPOSITION:  Recovery room stable.   DESCRIPTION OF PROCEDURE:  The patient was taken to the operating room and  placed in supine position where the area of the left arm and left axilla was  prepped and draped in the usual sterile fashion.  Incision was made over the  brachial artery and carried down to isolate the brachial artery which was  encircled with a vessel loop.  Next, separate incision was made at the level  of the axillary pulse, carried down to isolate the axillary vein.  The  patient had a good caliber of vein.  A tunnel was created from the level of  the antecubital space to the axilla and a 6 mm standard wall stretch graft  was brought through the tunnel.  The vein was occluded proximally and  distally, opened with an 11 blade and extended longitudinally with Potts  scissors.  The graft was spatulated and sewn end-to-side to the vein with a  running 6-0 Prolene suture.  Clamps were removed. The graft was flushed with  heparinized saline and reoccluded.  The patient was noted to have severe  venous hypertension with very brisk venous bleeding. The artery was occluded  proximally and distally, was opened with an 11 blade, extended  longitudinally with Potts scissors.  The graft was spatulated and sewn end-  to-side to the artery with a running 6-0 Prolene suture.  Small arteriotomy  was created. The wound was irrigated with saline.  Hemostasis obtained with  electrocautery.  Wounds closed with 3-0 Vicryl in the subcutaneous and  subcuticular tissue and Steri-Strips were applied.  Next, the patient was  reprepped and draped, the right and left neck were imaged with ultrasound  and revealed widely patent jugular veins bilaterally.  The patient was  placed in Trendelenburg position. The right and left neck and chest prepped  and draped in the usual sterile fashion.  Using local anesthesia and a  finder needle, the right internal jugular vein was identified. Next, using a  Seldinger technique, the guidewire was passed down to the level of the right  atrium.  Dilator and peel-away sheath was passed over this and the dilator  and guidewire removed.  Again, venous hypertension was noted.  The 32 cm  Diatek catheter was threaded through the peel-away sheath which  was removed.  The catheter was brought through a subcutaneous tunnel and the tips were  positioned at the level of the right atrial  superior vena caval junction.  The catheter was secured to the skin with a 3-  0 nylon stitch. The entry site was closed with a 4-0 subcuticular Vicryl  stitch.  Two lumen ports were attached and both lumens were locked with  heparinized saline 1000 units per mL.  The patient was then transferred to  the recovery room in stable condition.      Larina Earthly, M.D.  Electronically Signed     TFE/MEDQ  D:  07/14/2005  T:  07/15/2005  Job:  [id number]

## 2011-01-16 NOTE — Discharge Summary (Signed)
NAME:  [name], [name]                       ACCOUNT NO.:  [id number]   MEDICAL RECORD NO.:  [id number]                   PATIENT TYPE:  INP   LOCATION:  6715                                 FACILITY:  [facility]   PHYSICIAN:  Deirdre Peer. Polite, M.D.              DATE OF BIRTH:  July 18, 1966   DATE OF ADMISSION:  02/22/2003  DATE OF DISCHARGE:  03/07/2003                                 DISCHARGE SUMMARY   CARE PROVIDERS:  Her primary care Grettel Rames is Clovis Riley, Family Nurse  Practitioner, [facility].  Her nephrologist is Cecille Aver, M.D.  Her CVTS surgeon is Larina Earthly, M.D.  Her orthopedic surgeon is Lubertha Basque. Jerl Santos, M.D.  Her infectious disease doctor is Lacretia Leigh. Ninetta Lights, M.D.   DISCHARGE DIAGNOSES:  1. Severe osteomyelitis of right foot, home on intravenous antibiotic     therapy with close follow-up.     a. Status post partial amputation of right foot.  2. Type 1 diabetes mellitus, fair control.  3. Hypertension, good control.  4. Depression, good control.  5. Left ventricular dysfunction with an ejection fraction of 40%, no history     of myocardial infarction or cerebrovascular accident.  6. Chronic renal failure, stable, creatinine at time of discharge 4.2.  7. Normocytic, normochromic anemia, likely secondary to menorrhagia,     improved status post transfusion of two units packed red blood cells.  8. Status post right peripherally-inserted central catheter line placement     March 03, 2003.  9. Status post creation of left arteriovenous fistula on February 28, 2003.  10.      Methicillin-sensitive Staphylococcus aureus bacteremia, followed by     infectious disease.  11.      Pruritus secondary to hyperphosphatemia, resolved.   DISCHARGE MEDICATIONS:  1. Ancef 2 g IV q.12h. x2 weeks.  2. Flagyl 875 mg IV q.6h. x2 weeks.  3. Then Avalox 400 mg one p.o. daily x4 weeks to start on March 22, 2003.  4. Lexapro 20 mg daily.  5. Lantus  insulin 20 units subcu q.h.s.  6. Iron sulfate 325 mg p.o. t.i.d.  7. Senokot S one tablet b.i.d.  8. Nephro-Vite one tablet daily.  9. Lopressor 50 mg b.i.d.  10.      Calcitriol 0.25 mg daily.  11.      PhosLo 667 mg x2 tablets three times a day with meals.  12.      Accuzyme as needed.  13.      Norvasc 10 mg daily.  14.      Sarna Lotion as needed.  15.      Darvocet-N 100 one or two tablets q.4-6h. p.r.n. pain.  16.      Atarax 50 mg one or two tablets every six hours as needed for     itching.   ALLERGIES:  No known drug allergies.   PROCEDURES AND STUDIES:  1.  February 26, 2003, 2 D echocardiogram, which revealed no cardiac source of     embolism.  Technically difficulty study.  Estimated RV systolic pressure     was 51 mmHg, indicating moderate pulmonary hypertension.  Overall left     ventricular systolic function was at the lower limits of normal, ejection     fraction estimated at 50-55%.  This study was inadequate for the     evaluation of left ventricular regional wall motion.  There was mild     mitral valvular regurgitation.  The left atrium was moderately dilated.     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  There was moderate tricuspid     valvular regurgitation.  2. March 02, 2003, transesophageal echocardiogram.  Left ventricular ejection     fraction estimated at 50-55%.  No left ventricular regional wall motion     abnormalities.  Mild mitral valvular regurgitation.  Atrial septal     aneurysm.  Estimated peak pulmonary artery systolic pressure in the range     of 48-55 mmHg.  There was a moderate tricuspid valvular regurgitation.  A     small pericardial effusion.  No vegetations.  Bubble study performed:     Good opacification of the RA/RV.  Very small amount of bubbles traversed     the septum to the left side, indicative of a tiny PFO within the septal     aneurysm.  3. February 22, 2003, foot x-ray revealed soft tissue swelling with gas in the     soft tissues of the  first and second toes, probable osteomyelitis     involving the right great toe.  4. February 22, 2003, chest x-ray in comparison with film from February 06, 2003,     revealed cardiomegaly with probable vascular congestion.  5. February 25, 2003, renal ultrasound, right kidney is 12.6 cm in length, left     kidney is 13.6 cm.  Negative for hydronephrosis or focal renal lesion.     Unremarkable renal ultrasound.  6. March 03, 2003, PICC line placement under fluoroscopy without     complications.  7. March 06, 2003, MRI of the right foot reveals several regions suspicious     for osteomyelitis, including the lateral calcaneus, third metatarsal, and     remaining portions of the first and second metatarsal and the cuboid as     well as the navicular.  These manifest predominantly as edema rather than     overt destructive osteomyelitis.  No abscess is identified.  There are     some faint hypodensities around the first area of resection in the first     metatarsal, but these likely represent susceptibility artifact rather     than gas in the soft tissues.   HISTORY OF PRESENT ILLNESS:  A 45 year old white female with type 1 diabetes  and a history of diabetic foot infection, who was recently discharged on  February 09, 2003, and returns now with complaint of fever, chills, foot  draining purulent material x2 days.  She denies foot trauma and states that  she was compliant with antibiotic previously prescribed, which was a 10-day  course of Augmentin.  A foot x-ray is obtained in the emergency department  as noted above.  She is admitted for further evaluation and treatment of  suspected osteomyelitis in the right foot.   HOSPITAL COURSE:  The patient was admitted to a regular bed and maintained  on her outpatient medication regimen.  Blood cultures are  also obtained and  the patient is placed on Zosyn for coverage of her osteomyelitis. Orthopedic surgery was consulted and the patient underwent partial   amputation of her right foot on February 22, 2003, as noted.  She did sustain a  mild dehiscence of her foot wound on March 05, 2003, which was re-evaluated by  the orthopedic surgery team and they felt that there was no indication for  further I&D or re-exploration of the wound.  She is to go home on IV  antibiotics with close follow-up with Dr. Nolon Nations office.   The patient was found to be anemic with a hemoglobin of 7.2 upon  presentation.  Etiology of this was not immediately clear.  Upon admission,  however, the patient did state an extensive history of menorrhagia.  She was  therefore transfused with two units packed RBCs after her surgery.  Hemoglobin at time of discharge is 9.1.  The patient is free of signs or  symptoms of anemia or active bleeding.   Upon presentation the patient's blood sugar was 97 mg/dl.  She was  maintained on Lantus insulin and sliding scale coverage as noted above.  Her  diabetes remained under good control during this admission.  CBGs on day of  discharge are 93 and 126.  She was instructed to check her blood sugar twice  a day at minimum until she is re-evaluated by her primary care Gae Bihl.   The patient is known to have chronic renal failure and Dr. Jon Gills  team was consulted to maximize the patient's medical management of not only  her renal failure but also her other medications.  During this admission she  did have an AV fistula created in the left upper extremity in preparation  for dialysis, which she is expected to require sometime in the next year.  She did not require dialysis during this admission. Creatinine is as noted  above at time of discharge.  The patient did suffer some complaints of  generalized pruritus, thought to be secondary to hyperphosphatemia from her  renal failure.  This was well-controlled with Atarax, and she is discharged  home on same.  She has a follow-up appointment with Dr. Kathrene Bongo as  noted below.   The  patient's blood cultures did return positive for MSSA; therefore, the  infectious disease team was consulted and their recommendations for  antibiotic therapy were followed.  The patient will be discharged home on IV  Ancef and Flagyl x2 weeks and then one month's worth of Avalox p.o., and she  has a follow-up appointment with the infectious disease team as noted below.  At the recommendation of the infectious disease team, echocardiograms were  obtained to evaluate the patient's LV function as well as to assess for  presence or absence of valvular vegetations.  Findings are as noted above,  and the patient at this time does not have any evidence for vegetation nor  for cardiac source of emboli.  Her cardiomyopathy is stable at this time.   The patient's hypertension remained under good control during this  admission.  She was maintained on her outpatient medication and was discharged on medicines as noted above.  She will require follow-up for this  with her primary care Carrin Vannostrand.   The patient had MRI of the right foot performed on March 06, 2003, to evaluate  the extent of the remaining osteomyelitis of the right foot.  After these  results were reviewed by infectious disease, orthopedic surgery, and the  medicine team, consensus was reached that the patient could be managed well  on IV antibiotic therapy as mentioned above and with close follow-up with  all of her consulting team.  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  She is instructed to refrain  from working until she is cleared to do so by Dr. Jerl Santos of orthopedic  surgery.  She will also receive physical therapy at home and home health  R.N. to provide dressing changes twice a day and IV antibiotic therapy.   DISCHARGE LABORATORY DATA:  Phosphorus 5.6.  Sodium 140, potassium 5.0,  glucose 87, BUN 62,  creatinine 4.2.  White blood cell count 7.7, hemoglobin  9.1, hematocrit 27.9, platelet count 238,000.  LipoPhn-cholesterol 124,  lipoprotein A 26.  Parathyroid hormone intact is 322.5 and total calcium for  parathyroid hormone is 7.3.  Iron 24, TIBC 142, percent saturation 17.  C4  complement is 32, C3 complement is 115.  Ferritin is 424.  C. difficile  toxin was negative.   CONSULTATIONS:  1. Lubertha Basque Jerl Santos, M.D., orthopedic surgery.  2. Cecille Aver, M.D., nephrology.  3. Lacretia Leigh. Ninetta Lights, M.D., infectious disease.  4. Larina Earthly, M.D., CVTS.   CONDITION ON DISCHARGE:  Good.   DISPOSITION:  Discharged to home with home health R.N. and PT as noted  above.   FOLLOW-UP:  The patient will have home health R.N. twice a day at her house  to provide IV therapy and dressing changes.  Follow-up appointments include:  1. Clovis Riley, F.N.P. Monday, July 12 at 10:30 a.m.  2. Dr. Jerl Santos of orthopedic surgery July 16 at 10 a.m.  3. Dr. Kathrene Bongo of nephrology July 16 at 12 p.m.  4. Dr. Ninetta Lights of infectious disease July 28 at 9 a.m.   The patient is provided with these appointment times as well as the  physician phone numbers, a complete list of her medications.   The patient is instructed to check her blood sugar first thing in the  morning and at bedtime at a minimum.  She is to record these blood sugar  values.  The patient is instructed to not let ANYONE take her blood pressure  or stick a needle in her left arm, and the patient is instructed to not  return to work until after permitted to do so by Dr. Jerl Santos.     Ellender Hose. Virl Son. Polite, M.D.    SMD/MEDQ  D:  03/07/2003  T:  03/07/2003  Job:  [id number]   cc:   [facility] Clovis Riley, FNP   Lubertha Basque. Jerl Santos, M.D.  [address]  [place]  [place] [address]  Fax: [phone]   Cecille Aver, M.D. [address]  [place]  [place] [address]   Fax: [phone]   Lacretia Leigh. Ninetta Lights, M.D.  [address]  [place]  [place] [address]  Fax: [phone]   Larina Earthly, M.D.  [address]  [place]  [place] [address]  Fax: [phone]    cc:   [facility] Clovis Riley, FNP   Lubertha Basque. Jerl Santos, M.D.  [address]  [place]  [place] [address]  Fax: [phone]   Caryl Asp A.  Kathrene Bongo, M.D.  [address]  [place]  [place] [address]  Fax: [phone]   Lacretia Leigh. Ninetta Lights, M.D.  [address]  [place]  [place] [address]  Fax: [phone]   Larina Earthly, M.D.  [address]  [place]  [place] [address]  Fax: [phone]

## 2011-01-16 NOTE — Consult Note (Signed)
NAME:  [name], [name]                       ACCOUNT NO.:  [id number]   MEDICAL RECORD NO.:  [id number]                   PATIENT TYPE:  REC   LOCATION:  FOOT                                 FACILITY:  [facility]   PHYSICIAN:  Jonelle Sports. Sevier, M.D.              DATE OF BIRTH:  03/03/1966   DATE OF CONSULTATION:  07/19/2003  DATE OF DISCHARGE:                                   CONSULTATION   REFERRING PHYSICIAN:  Dr. Lubertha Basque. [facility].   HISTORY:  This 45 year old white female is seen at the [facility]  for management of multiple lower extremity wounds.   The patient has a complex medical history centered around 18 years of type 1  diabetes with chronic renal failure and dialysis on that basis.  She  apparently had infection in her foot and underwent a ray amputation of the  1st and 2nd toes of the right foot in June of this year.  Since that time,  the operative site never completely healed and, in addition, she has  subsequently developed two other lesions, namely a pressure sore on the  plantar aspect of the right heel and also a wound on the posterior aspect of  the right ankle at the Achilles' tendon area which allegedly developed  following the use of a PRAFO boot in an effort to protect her heel lesion.  Finally, she has recently noticed the formation of a blister-like lesion at  the right lateral malleolar area.  The wounds have been treated most  recently with a vacuum-assisted closure device to the heel and surgical site  wounds of the right foot and with simply dressing changes to the wound of  the posterior right ankle.   In addition, the patient has a chronic inflamed area on the posterior aspect  of the left calf which she says has been present since some cellulitis over  a year ago.   She is here now for our assistance with management of these various  problems.   PAST MEDICAL HISTORY:  Past medical history is negative except for those  things previously  mentioned.   ALLERGIES:  She has no known medicinal allergies.   MEDICATIONS:  She is on Cipro 500 mg b.i.d. following intravenous course  during her hospitalization in June and now given orally.  Other medicines  include metoprolol, hydroxyzine, Lexapro, Phos-Lo, Epogen and calcitriol.   EXAMINATION:  EXTREMITIES:  Examination today is limited to the lower  extremities.  The left lower extremity is noteworthy primarily for a minor  degree of swelling, normal skin temperatures, a small corn on the  dorsolateral aspect of the 5th toe and the area of inflammatory change  posteriorly in the body of the left calf.  There is no evidence of active  infection there at the moment.   The right lower extremity is noted to be normal in temperature with again  some mild edema.  There  is surgical absence of the 1st and 2nd toes and  metatarsal heads of that foot with an open surgical wound measuring 35 x 15  mm with a good granular base at that surgical site.  On the plantar aspect  of the right heel, is located another well-granulated ulcer with some  surrounding macerated tissue measuring 11 x 15 mm.  Finally, on the  posterior aspect of the right calf is a 31 x 11-mm ulcerated area with  partial eschar in place.   On the lateral malleolar area on the right, is a blister-like lesion  measuring approximately 11 mm in diameter which is not clearly fluid-filled  and which shows no signs of secondary infection or inflammation.   DISPOSITION:  1. It is recommended to the patient that we continue the wound V.A.C.     therapy to the plantar heel wound and the surgical site wounds of the     right foot.  2. The posterior ankle wound overlying the Achilles' tendon on that right     side is sharply debrided without incident and then is treated with wound     gel and an application of Promogran.  This was then covered with an     Allevyn pad.  3. The blister-like lesion on the lateral malleolar area on  the right is     covered simply with an Allevyn pad.  4. The patient is given 0.1% triamcinolone cream to apply to the reddened     and inflamed area of the left lower extremity daily for five days and     then every other day.  5. Her wound care will be by [facility] with a change of     the V.A.C. as well as a change of the dressing to the posterior calf     wound on a Monday/Wednesday/Friday basis.  6. Followup visit will be to this clinic in 12 days.                                               Jonelle Sports. Cheryll Cockayne, M.D.    RES/MEDQ  D:  07/19/2003  T:  07/20/2003  Job:  [id number]   cc:   Lubertha Basque. Jerl Santos, M.D.  [address]  [place]  [place] [address]  Fax: [phone]

## 2011-01-16 NOTE — Discharge Summary (Signed)
NAME:  [name], [name]           ACCOUNT NO.:  [id number]   MEDICAL RECORD NO.:  [id number]          PATIENT TYPE:  INP   LOCATION:  5509                         FACILITY:  [facility]   PHYSICIAN:  Margaretmary Bayley, M.D.    DATE OF BIRTH:  July 21, 1966   DATE OF ADMISSION:  05/20/2004  DATE OF DISCHARGE:  05/23/2004                                 DISCHARGE SUMMARY   DISCHARGE DIAGNOSES:  1.  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  2.  Diabetic peripheral neuropathy.  3.  Arteriosclerotic cardiovascular disease.  4.  Anemia related to her end-stage renal disease and iron deficiency.   REASON FOR ADMISSION:  [name] is a 45 year old type 1 diabetic, with end-  stage renal disease, on chronic hemodialysis, who is transferred from [facility] for poorly controlled diabetes.  On the day of admission, she  was referred by Dr. Excell Seltzer. Wrenn for transfusion of a unit of red blood  cells for her anemia and found at that time to have a blood sugar of 849.  She was once told that she should never take insulin when she was going to  the hospital and because of this, her last insulin dose was more then 36  hours prior to admission.  Her regimen includes 40 of Lantus daily with a  preprandial fractional schedule of NovoLog.   PERTINENT PHYSICAL FINDINGS:  GENERAL:  She is a well-developed, obese  Caucasian lady, anxious, but without any acute distress.  She has no fruity  odor suggesting ketoacidemia.  VITAL SIGNS:  Blood pressure is 142/64, pulse rate of 74, respiratory rate  of 20 and her temperature was 99.4.  HEENT:  Her sclerae are anicteric.  She had a moderate amount of  conjunctival pallor, no nystagmus.  Her pharynx was without any exudates.  LUNGS:  Lungs were clear.  She had no focal dullness.  There was no  asterixis.  CARDIAC EXAM:  She had a regular rhythm, no murmurs, rubs, or gallops.  ABDOMEN:  Her  abdomen was obese, but soft and nontender, no organomegaly and  no masses.  EXTREMITIES:  There was a A-V fistula in the left arm proximally.  She had  reduction in her dorsalis pedis and posterior tibialis pulses bilaterally,  but no acute ischemic changes were noted.  She is status post amputation of  the 1st and 2nd toes of the left foot.  NEUROLOGIC:  Neurologically, there is reduction and pinprick vibration  perception in both feet and distal legs in a glove-stocking distribution  that would be consistent with a peripheral neuropathy.   PERTINENT LABORATORY AND X-RAY DATA:  Her creatinine was 3.6 with a glucose  of 849.  CO2 was 30.   ASSESSMENT AND PLAN:  Considering the patient is a poorly controlled insulin-  dependent diabetic complicated by retinopathy, peripheral neuropathy and end-  stage renal disease, she is admitted at this time with nonketotic  hyperosmolar uncontrolled diabetes related mostly to misinformation given to  the patient to withhold her insulin when she is n.p.o. prior to  hospitalization.  She is also noted to have an anemia of chronic disease  felt to be related in combination to her end-stage renal disease as well as  iron deficiency related to her menorrhagia; the latter is being evaluated by  her gynecologist.   HOSPITAL COURSE:  The patient was admitted, started back on Lantus insulin  along with a fractional schedule for NovoLog and she did quite well.  Her  dialysis schedule was not interrupted, in that she was seen by the renal  team here at [facility], and at that time, she was likewise given some  parenteral iron replacement to assist in correcting her iron deficiency.  The patient was seen here in the hospital by Dr. Cecille Aver, who  felt that there was no renal cause for continuing her stay, once her  diabetes was controlled.  During this hospitalization, her Lantus was  increased to 30 units every 12 hours and her fractional  schedule of NovoLog  was adjusted, and for the most part, we found that the patient required more  NovoLog coverage than she had been receiving at home.  The patient was  subsequently discharged in a condition that was significantly improved.   FOLLOWUP:  She is to continue her dialysis schedule per Dr. Kathrene Bongo and  Dr. Garnetta Buddy, and she is to be followed up by her gynecologist to  evaluate her menometrorrhagia.  She is scheduled to be seen back in my  office in 2-3 weeks.   DISCHARGE MEDICATIONS:  The only change in her medicines during  hospitalization was an increase in her Lantus to 30 units subcu every 12  hours.   DIET:  She is discharged on a 90 g protein, 2 g potassium 2 g sodium, carb-  modified diet and she is to use NovoLog coverage at 1 unit per 5 g of  carbohydrate ingested.       PC/MEDQ  D:  07/17/2004  T:  07/18/2004  Job:  [id number]

## 2011-01-16 NOTE — Discharge Summary (Signed)
NAME:  [name], [name]                       ACCOUNT NO.:  [id number]   MEDICAL RECORD NO.:  [id number]                   PATIENT TYPE:  INP   LOCATION:  6737                                 FACILITY:  [facility]   PHYSICIAN:  Cecille Aver, M.D.        DATE OF BIRTH:  1966/06/11   DATE OF ADMISSION:  05/18/2003  DATE OF DISCHARGE:  05/21/2003                                 DISCHARGE SUMMARY   DISCHARGE DIAGNOSES:  1. Uremia secondary to end-stage renal disease with the initiation of     dialysis.  2. Iron deficiency anemia.  3. Secondary hyperparathyroidism.  4. Volume overload.  5. Type 2 diabetes mellitus.  6. Peripheral vascular disease.     a. Status post right foot reamputation with chronic wound.  7. Depression.   HISTORY OF PRESENT ILLNESS:  [name] is a 45 year old white female  with long-standing type 2 diabetes mellitus poorly controlled with diabetic  nephropathy who is status post AV fistula placement July 2004.  She also has  hypertension, peripheral vascular disease, status post ray amputation of  right foot with multiple infections, currently wearing a VAC over this  wound.  She also had a recent hospitalization for volume overload requiring  high dose diuretics.  The patient had been feeling poorly for at least a  week.  She has increased fluid in her legs, nausea, vomiting, falling with  bruising, itching, and hyperglycemia.  The patient was worked into the  office today with the above complaints.  On examination her BUN was 112,  albumin 3.2.  The patient needs to begin dialysis.  Her AV fistula is  mature, but somewhat tenuous.  The remainder of physical examination is  outlined in admission history and physical.   HOSPITAL COURSE:  Problem 1 - UREMIA/END-STAGE RENAL DISEASE:  The patient  was admitted to the renal unit.  She had her first dialysis treatment on the  17th without any problems with her fistula.  Laboratories drawn on that  day  showed a white count of 5.1, hemoglobin 11.8, hematocrit 35.8, platelets  143,000.  Iron studies were total iron 36, 15% saturation, ferritin 340.  Hepatitis B surface antigen negative.  Her intact PTH was 154.  She received  serial dialysis treatments during her hospitalization.  Her fistula was very  sensitive and infiltrated.  At times we were able to reduce her weight 8 kg  by the time of discharge but her dry weight will need to be lowered further.  The patient will dialyze at the [facility].  New dialysis  orders were faxed to the center at the time of the patient's discharge.  Will try to use a longer gauge needle for her venous site of her AV fistula  due to the depth.  She has been instructed to stop her Lasix and Zaroxolyn  as well as Rocaltrol.   Problem 2 - IRON DEFICIENCY ANEMIA:  The patient is on a  combination of  Epogen and iron was repleted.  She will finish a course of iron in the  outpatient setting.   Problem 3 - SECONDARY HYPERPARATHYROIDISM:  PTH is controlled.  She is  instructed not to take Rocaltrol anymore.  We will give calcitriol at the  outpatient center and ulcers will be controlled with PhosLo 667 mg two  tablets with meals t.i.d.  Phosphorous in the hospital was 4.3 and 3.7 with  calcium 8.8 and slightly low albumin 2.7.   Problem 4 - VOLUME EXCESS:  It will need to be titrated down in the  outpatient setting gradually.   Problem 5 - TYPE 2 DIABETES MELLITUS:  The patient is on Lantus as well as  Reglan for gastroparesis symptoms.  Glucose was high on admission but down  to 164 at the time of discharge.  She will have Accu-Cheks q. treatment at  the outpatient center as well as quarterly hemoglobin A1C.   Problem 6 - PERIPHERAL VASCULAR DISEASE WITH CHRONIC RIGHT FOOT WOUND FROM  PRIOR RAY AMPUTATION:  This is followed by Lubertha Basque. Jerl Santos, M.D. in the  outpatient setting.  She continues to use a vac for healing and is to take   Cipro 500 mg daily as instructed by Lubertha Basque. Jerl Santos, M.D.   Problem 7 - DEPRESSION:  The patient's spirits were good during her  hospitalization.  She takes Lexapro 20 mg daily and she seems to be  accepting of the need to start dialysis.   CONDITION ON DISCHARGE:  Improved.   DISCHARGE MEDICATIONS:  1. Nephro-Vite one tablet daily.  2. Lantus 40 units subcutaneous q.h.s.  3. Lexapro 20 mg daily.  4. Baby aspirin 81 mg daily.  5. PhosLo 667 mg two tablets with meals t.i.d.  6. Toprol XL 25 mg which she uses one-half of 50 mg tablet at bedtime.  This     is a new dose.  7. Cipro 500 mg daily as instructed by Lubertha Basque. Dalldorf, M.D.  8. Reglan 10 mg before meals and at bedtime.  9. The patient is instructed to stop her Aranesp, Rocaltrol, Lasix,     Zaroxolyn, and Norvasc.   PAIN MANAGEMENT:  Use Tylenol as needed.  Use ice to AV fistula to help  swelling.   ACTIVITY:  No weightbearing on right foot.   DIET:  90 g protein, 2 g sodium, 2 g potassium diabetic diet.  Limit fluids  to 1200 mL daily.   WOUND CARE:  Use vac on right foot as instructed by Lubertha Basque. Jerl Santos, M.D.    SPECIAL INSTRUCTIONS:  Dialysis orders were faxed to the [facility].  She is also to have 1-1/4 inch needles on her venous side and usual  15-gauge needle on her arterial side.  Dialysis schedule will be Monday,  Wednesday, Friday at 11:45.      Weston Settle, P.A.                      Cecille Aver, M.D.    MB/MEDQ  D:  06/05/2003  T:  06/06/2003  Job:  [id number]   cc:   [facility]   Lubertha Basque Jerl Santos, M.D.  [address]  [place]  [place] [address]  Fax: [phone]

## 2011-01-16 NOTE — H&P (Signed)
NAME:  [name], PARCO           ACCOUNT NO.:  [id number]   MEDICAL RECORD NO.:  [id number]          PATIENT TYPE:  INP   LOCATION:  1829                         FACILITY:  [facility]   PHYSICIAN:  Terrial Rhodes, M.D.DATE OF BIRTH:  [birth year]   DATE OF ADMISSION:  01/27/2005  DATE OF DISCHARGE:                                HISTORY & PHYSICAL   CHIEF COMPLAINT:  Weakness.   HISTORY OF PRESENT ILLNESS:  Ms. [name] is a 45 year old white female  with multiple medical problems most notable for end-stage renal disease  secondary to poorly controlled insulin-requiring diabetes mellitus and  hypertension who was in her usual state of health until she went to dialysis  today with over 10 kilos above her dry weight.  Her dialysis treatment was  complicated by multiple episodes of hypotension, which she reports as  normal, and can generally tolerate systolic blood pressures in the 70s,  which they did drop down to at 73/45 and 68/20 during her treatment.  Her  dialysis was complicated by associated nausea, vomiting, and also following  dialysis, in which a total of 6 kilos was removed, she was unable to move  her arms or legs and was too weak to move.  She does report that she was due  to go back to dialysis tomorrow to help remove more fluid, and also that she  was to go to the hospital for a chest x-ray to rule out a congestive heart  failure; however, given her inability to move or walk, she requested  transfer to the [facility] for further evaluation.   ALLERGIES:  She has no known drug allergies.   PAST MEDICAL HISTORY:  1.  End-stage renal disease secondary to diabetes and hypertension.  On      dialysis since September of 2004.  2.  Type 1 diabetes complicated by retinopathy, neuropathy, nephropathy, and      gastroparesis.  3.  Hypertension.  4.  Secondary hyperparathyroidism.  5.  Depression.  6.  Severe peripheral neuropathy.  7.  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  8.  History of vulvar abscess, as well as skin abscesses on her back.  9.  Hyperlipidemia.  10. Iron-deficiency anemia.  11. Obesity.  12. Noncompliance.   CURRENT MEDICATIONS:  1.  Nephro-Vite one daily.  2.  Lantus 80 units subcutaneous b.i.d.  3.  NovoLog sliding scale.   FAMILY HISTORY:  Mother is alive at age 59.  She has diabetes and  hypertension.  Father is alive at age 66.  He has coronary disease.  There  is no family history of kidney disease.   SOCIAL HISTORY:  She is engaged.  No tobacco, no alcohol.   REVIEW OF SYSTEMS:  GENERAL:  She did not have any anorexia or malaise prior  to this episode.  OPHTHALMIC:  No blurred vision or photophobia.  HEENT:  No  headaches, no tinnitus, dysphagia, odynophagia.  CARDIAC:  No chest pain,  palpitations, orthopnea, PND.  PULMONARY:  No shortness of breath,  hemoptysis.  GI:  No  nausea, vomiting, hematochezia, melena, or bright red  blood per rectum.  GU:  No dysuria, pyuria, hematuria, urgency, frequency,  or retention.  NEUROLOGIC:  She was unable to move her arms or hands earlier  today.  She is now able to move her hands and arms.  She is able to move her  legs better, but is still not able to ambulate due to weakness.  All other  systems negative.   PHYSICAL EXAMINATION:  GENERAL:  Well-developed, obese female lying in bed  in no apparent distress.  VITAL SIGNS:  Temperature 98.5, pulse 82, respiratory rate 20, blood  pressure 107/59.  HEENT:  Normocephalic and atraumatic.  Pupils equal, round and reactive to  light.  Extraocular muscles intact.  No icterus.  Oropharynx was without  lesions.  NECK:  Supple with full range of motion.  No lymphadenopathy, no bruits.  LUNGS:  Decreased breath sounds at the bases.  CARDIAC:  Regular rate and rhythm.  No precordial rub appreciated.  ABDOMEN:  Normoactive bowel sounds, soft, nontender, nondistended.   EXTREMITIES:  She has 1+ pitting edema bilaterally of her lower extremities.  She is status post partial ray amputation of her right first and second toe.  She has an AV graft in her left upper arm with a palpable thrill and audible  bruit.  NEUROLOGIC:  She had 4/5 upper extremity motor strength.  Cranial nerves II-  XII were grossly intact.  She had severe peripheral neuropathy.  She had 3/5  strength of her lower extremities.  She had no deep tendon reflexes in her  lower extremities.   LABORATORY DATA:  Currently pending.   ASSESSMENT AND PLAN:  1.  Weakness/inability to walk.  The patient reports that she has been      taking Lyrica 50 mg b.i.d. over the last 3 months, but has recently      stopped because she felt that it was causing her blood pressures to be      too low at dialysis.  The most worrisome is a spinal cord infarction,      given her hypotensive episode during dialysis, as well as her large      weight gains, which make it impossible to remove this fluid without      hypotension.  At this time, we will hold Lyrica and will also check an      MRI of her LS spine to rule out infarction versus herniated disk.  Will      also check potassium levels and continue to follow.  2.  Congestive heart failure.  The patient had large intradialytic weight      gains.  Will plan for dialysis again tomorrow, and try daily dialysis at      the __________, and get her back to her baseline dry weight.  Again, the      patient was strongly encouraged to adhere to fluid restriction.  3.  End-stage renal disease, normally on dialysis every Tuesday, Thursday,      and Saturday; however, she gets it more frequently due to the excess      volumes.  Plan for dialysis tomorrow.  4.  Anemia.  Will continue with Epogen.  5.  Hypotension secondary to large volume gains and ultrafiltration; will      continue the follow.  6.  Diabetes mellitus.  Will place on a carbohydrate modified diet.      Continue with Lantus and sliding scale insulin.   Will continue  to follow.     JC/MEDQ  D:  01/27/2005  T:  01/27/2005  Job:  [id number]

## 2011-01-16 NOTE — Discharge Summary (Signed)
NAME:  [name], [name]                       ACCOUNT NO.:  [id number]   MEDICAL RECORD NO.:  [id number]                   PATIENT TYPE:  INP   LOCATION:  6742                                 FACILITY:  [facility]   PHYSICIAN:  Cecille Aver, M.D.        DATE OF BIRTH:  1966-01-18   DATE OF ADMISSION:  04/08/2003  DATE OF DISCHARGE:  04/18/2003                                 DISCHARGE SUMMARY   ADMISSION DIAGNOSES:  1. Congestive heart failure.  2. Pulmonary edema.  3. Insulin-dependent diabetes mellitus.  4. Status post ray amputation.  5. Iron-deficiency anemia.  6. Hypertension.   DISCHARGE DIAGNOSES:  1. Congestive heart failure/anasarca, improved.  2. Right foot cellulitis status post ray amputation.  3. Iron-deficiency anemia.  4. Insulin-dependent diabetes mellitus.  5. Hypertension.  6. Secondary hyperparathyroidism.  7. Chronic renal failure.   CONSULTATIONS:  Dr. Luiz Blare on April 10, 2003 and wound care--Deborah Excell Seltzer,  RN on April 10, 2003; both recommending wet-to-dry dressing b.i.d.  By the  end of the hospital course they recommended VAC as soon as this was approved  by [employer].   PROCEDURES:  None.   BRIEF HISTORY:  Ms. [name] is a 45 year old white woman with  insulin-dependent diabetes mellitus, longstanding, with diabetic nephropathy  (baseline creatinine 2.5-3); status post AV fistula on July 2004;  hypertension; status post recent ray amputation for infected toes, currently  on antibiotics and dressing changes.  She presented to the ED on April 08, 2003 because of a 1-2 week history of worsening lower extremity, edema,  shortness of breath and dyspnea on exertion to the point where it was  difficult for her to ambulate.  She also had decreased appetite, increased  fatigue, and inability to perform ADLs.  She reported taking her Lasix as  directed and being compliant with low salt diet and fluid restriction.   EMERGENCY  ROOM PHYSICAL EXAMINATION:  VITAL SIGNS:  Temperature 98, blood  pressure 142/83, heart rate 60, respirations 20.  Pulse oximetry 99% on room  air.  GENERAL APPEARANCE:  An obese, white female, oriented in no acute distress.  HEENT:  Pupils equal, round reacted to light and accommodation.  No  extraocular movements noted.  Mucous membranes moist.  NECK:  Positive JVD.  LUNGS:  Decreased breath sounds at bases.  CARDIOVASCULAR:  Regular rate and rhythm without murmurs, gallops, or rubs.  ABDOMEN:  Obese, positive bowel sounds. Positive abdominal wall edema.  Soft, nontender, nondistended.  EXTREMITIES:  2+ edema up to abdominal wall.  Status post ray amputation on  right with surrounding cellulitis left upper extremity AV fistula with good  thrill and bruit, not fully matured.  NEUROLOGIC: Alert and oriented, slow to move secondary to edema.   EMERGENCY ROOM LABS:  White count 6.7, hemoglobin 10.6, platelets 191,000.  CK 59, troponin 0.01.  Sodium 140, potassium 4.4, chloride 117, CO2 19, BUN  70, creatinine 3.1, glucose 114, albumin 2.3.  Chest x-ray in the emergency  department showed cardiomegaly and pulmonary edema.  She was admitted to  attempt aggressive diuretic regimen using Zaroxolyn, Lasix and following  in's and out's.  Creatinine upon presentation in the emergency department  was at baseline.  The plan, also at admission, was to check PTH, following  phosphorus, check potassium, check iron stores, continue dressing changes,  Cipro, and to manage her diabetes.   HOSPITAL COURSE BY PROBLEM:  Problem 1: PULMONARY EDEMA/CONGESTIVE HEART  FAILURE.  Using high dose diuretics and continuing observation the patient  was diuresed.  On hospital day #8 the patient was weighed at 115 kg, on day  of admission she weighed 136.7 kg.  She was able to diurese anywhere between  4000 and 6000 cc per day for the first several days of her hospitalization.  On the day of discharge she weighed  112.4 kg.  On the day of admission her  weight was 136.7 kg.  She tolerated the diuresis well.  We had to use a  Foley catheter because of her nonweightbearing status to her foot.   Problem 2: STATUS POST RAY AMPUTATION.  By using the [facility] her ray amputation  site and heel were granulating well.  Dr. Luiz Blare consulted on Ms.  Satterwhite on August 10 as well as Delma Freeze, RN for wound care.  They  recommended dressing changes twice a day and wound VAC.  This was to be  continued on and outpatient basis as well as [name]'s insurance  would cover it, which we felt at most, would be the day after discharge.  From the beginning of her hospitalization [name] received Cipro 400  mg IV q.24h. This was changed on the 11th to Cipro 400 mg IV q.12h. and then  changed back to Cipro 400 mg IV daily.  IV was changed to p.o. on August 13.  All in all she received a 14-day course of Cipro (10 days while she was in  the hospital and 4 more days after discharge).   Problem 3: IRON-DEFICIENCY ANEMIA.  Upon admission [name]'s  hemoglobin was 10.6, hematocrit 32.4, iron 22, TIBC 202, % saturation 11,  ferritin 256.  We continued her Aranesp 100 mcg every week and encouraged  ferrous sulfate 325 mg 2 tablets a day upon discharge.   Problem 4: INSULIN-DEPENDENT DIABETES MELLITUS.  This was very well  controlled by middle-to-end of hospital course.  We discharged her on her  Lantus 40 units subcu q.h.s.   Problem 5: CHRONIC RENAL FAILURE.  Again, she has a maturing AV fistula in  her left upper arm.  She remained at her baseline creatinine from 2.8 to 3.3  throughout the whole hospital course.  Phosphorus was noted to be at 4.9.  BUN was from 70 to 97.  Potassium was from 3.3 to 4.6.  However, phosphorus  did come down by the end of the hospital course.  Last phosphorus checked in  the hospital was 4.8.  Problem 6: SECONDARY HYPERPARATHYROIDISM.  Intact PTH was 265.7 pg/mL,   Calcitriol was started at 0.5 mcg p.o. daily.   Problem 7: HYPERTENSION.  Systolic blood pressures were in the 140-150s.  This came down nicely with diuresis.  Upon discharge her blood pressure was  130/63.  She was discharged on Norvasc 5 mg once a day and Toprol XL 25 mg  once a day.   Problem 8: HISTORY OF DEPRESSION. She was maintained on Lexapro at 20 mg  p.o.  daily.   DISPOSITION:  Discharged to home in improved condition.   PAIN MANAGEMENT:  Tylenol as needed.   ACTIVITY:  Nonweightbearing right foot.   DIET:  Low sodium ADA.   WOUND CARE:  Wet-to-dry dressing change twice a day until wound VAC arrives  to home.  Special instructions to patient will be to weigh daily and bring  weights to follow up appointment at [facility].  She understands to  call office if weight gain greater than 5 pounds.   FOLLOW UP:  1. Dr. Kathrene Bongo on April 25, 2003 at 1 p.m. at [facility].  2. Dr. Jerl Santos in 2 weeks.  Patient understands to call his office for that     appointment.   DISCHARGE MEDICINES:  1. Cipro 500 mg 1 tablet p.o. q.h.s. x4 days.  2. Calcitriol 0.5 mcg p.o. daily.  3. Nephro-Vite 1 tablet p.o. daily.  4. PhosLo 667 mg 2 tablets with meals.  5. Norvasc 5 mg p.o. daily.  6. Toprol XL 25 mg p.o. daily.  7. Lexapro 20 mg p.o. daily.  8. Lantus 40 units subcu q.h.s.  9. Ferrous sulfate 325 mg 2 tablets p.o. daily between meals.  10.      Lasix 160 mg 2 tablets per day.  11.      Zaroxolyn 2.5 mg p.o. daily.  12.      Aranesp 100 mcg every week on Friday's to be continued.      Hillery Hunter, PA                   Cecille Aver, M.D.    MJG/MEDQ  D:  04/23/2003  T:  04/23/2003  Job:  [id number]   cc:   [facility]   Harvie Junior, M.D.  [address]  [place]  [place] [address]  Fax: [phone]   Lubertha Basque. Jerl Santos, M.D.  [address]  [place]  [place] [address]  Fax: [phone]

## 2011-01-16 NOTE — H&P (Signed)
NAME:  [name], [name]           ACCOUNT NO.:  [id number]   MEDICAL RECORD NO.:  [id number]          PATIENT TYPE:  AMB   LOCATION:  SDC                           FACILITY:  [facility]   PHYSICIAN:  Richardean Sale, M.D.   DATE OF BIRTH:  [birth year]   DATE OF ADMISSION:  DATE OF DISCHARGE:                                HISTORY & PHYSICAL   PREOPERATIVE DIAGNOSIS:  Vulvar abscess.   HISTORY OF PRESENT ILLNESS:  This is a 45 year old white female who has a 4-  day history of a vulvar abscess of the left labia majora.  The patient  originally presented on October 14, 2004 with complaints of a boil on the  vulva.  Examination at the time revealed a 2- to 3-cm area of induration in  the left labia majora with an opening in the skin external to the vestibule;  this area was incised and drained in the office and the patient was started  on oral antibiotic therapy.  The patient has had no significant clinical  change and presents for incision and drainage of the abscess.   PAST MEDICAL HISTORY:  1.  Poorly controlled insulin-dependent diabetes, known history of      noncompliance.  2.  End-stage renal disease secondary to diabetes.  3.  Recent left lower lobe pneumonia, status post thoracentesis for pleural      effusion.  4.  Acute pancreatitis, December 2005.  5.  Anemia of chronic disease.  6.  Secondary hyperparathyroidism.  7.  History of menorrhagia, status post D&C with removal of endometrial      polyps.  8.  History of diabetic foot ulcer colonized with methicillin-resistant      Staphylococcus aureus.   SURGICAL HISTORY:  1.  D&C, 2005, for endometrial polyps.  2.  Debridement of foot ulcer.   GYNECOLOGICAL HISTORY:  1.  Menorrhagia, now controlled with Lupron therapy.  2.  Recent diagnosis of ovarian cyst, followup ultrasound pending.   OBSTETRICAL HISTORY:  Spontaneous Ab x1.   REVIEW OF SYSTEMS:  Review of systems negative for chest pain, shortness of  breath,  palpitations, lightheaded, dizziness; positive for diabetic  neuropathy, mild pain in the left vulva and drainage from the vulvar  abscess.   PHYSICAL EXAM:  VITAL SIGNS:  Blood pressure is 150/78, temperature 97.8;  weight 237, height 5 foot 7-1/2 inches.  GENERAL:  She is a normal-appearing white female in no acute distress.  HEART:  Regular rate and rhythm.  LUNGS:  Lungs clear to auscultation bilaterally.  ABDOMEN:  Abdomen is soft, nontender and non-distended, no palpable masses,  exam compromised by obesity.  EXTREMITIES:  Dermatitis secondary to diabetes and venous stasis; right  foot, status post debridement.  PELVIC:  On examination of the vulva and the labia majora, there is a 3- to  4-cm indurated area that is slightly fluctuant.  The  mass is predominantly  external to the vestibule and is palpable in the vagina 1 cm superior to the  hymenal ring.  There is purulent drainage expressed from the previous  incision site.  There are mild erythematous changes in the  overlying skin  tissues extending up to the inferior aspect of the mons and does not extend  past the vulva.  There is no evidence of any necrotizing infection or  streaking of the skin.   ASSESSMENT:  [age]-year-old gravida 1, para 0-0-1-0 white female with  vulvar abscess and poorly controlled diabetes.  Abscess has not responded to  conservative management with oral antibiotics and office incision and  drainage.   PLAN:  1.  Given the patient's poorly controlled diabetes, recommend incision and      drainage under local anesthesia in the operating room with possible      packing of the abscess cavity or placement of a Word catheter.  I      reviewed with patient that if the wound needs to be packed, this will      have to be performed on at least a daily basis and she may require      inpatient hospitalization for intravenous antibiotics, wound care and      control of her diabetes.  I have asked her  endocrinologist, Dr. Margaretmary Bayley, to be involved in her postoperative management.  The patient has      regularly scheduled dialysis on Monday, Wednesday and Friday and had her      most recent dialysis treatments on the day prior to this planned      procedure.  2.  I have reviewed the risks of the procedure which would include a      disfiguring scar, delayed or incomplete healing secondary to the      patient's poorly controlled diabetes, the possibility of a necrotizing      infection which would require additional debridement and possible      vulvectomy; the patient voices understanding of these risks and agrees      to proceed.  3.  A preoperative consultation with anesthesia has been requested, given      the patient's multiple comorbidities.  4.  The dialysis team has been made aware of the plans for this procedure.      If the patient requires prolonged inpatient admission, we will continue      dialysis Monday/Wednesday/Friday per their recommendations.      JW/MEDQ  D:  10/18/2004  T:  10/18/2004  Job:  [id number]

## 2011-01-16 NOTE — Consult Note (Signed)
NAME:  [name], [name]                       ACCOUNT NO.:  [id number]   MEDICAL RECORD NO.:  [id number]                   PATIENT TYPE:  INP   LOCATION:  5015                                 FACILITY:  [facility]   PHYSICIAN:  Sandria Bales. Ezzard Standing, M.D.               DATE OF BIRTH:  [birth year]   DATE OF CONSULTATION:  DATE OF DISCHARGE:                                   CONSULTATION   REASON FOR CONSULTATION:  Infection of right foot.   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  She was presented to the [facility] and was admitted by Dr. Julio Sicks  who is covering for Dr. Janey Greaser as a hospitalist with right leg swelling and  cellulitis.  He asked me to see her for the changes in her right foot.   She has had no prior foot problems quite like this, but she does have a  diabetic neuropathy.  She has been diabetic her adult life.  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 Janey Greaser as  her primary medical doctor.   PAST MEDICAL HISTORY:   ALLERGIES:  She has no allergies.   CURRENT MEDICATIONS:  1. Lasix 80 mg b.i.d.  2. Norvasc 10 mg daily.  3. Toprol daily.  4. She takes Humalog, Humulin.  She has 40/30 in the morning and 40/30 in     the evening.  5. She is on Lexapro 20 mg daily.   REVIEW OF SYSTEMS:  NEUROLOGIC:  She has no history of seizure or loss of  consciousness.  PULMONARY:  She does not smoke cigarettes.  No history of  pneumonia or tuberculosis.  CARDIAC:  She has hypertension with ejection  fraction of 40-45%.  She has had hypertension for at least two or three  years.  GASTROINTESTINAL:  She has no history of peptic ulcer disease, liver  disease, bowel disease, or change in bowel habits.  UROLOGIC:  No  __________or kidney infections.   MUSCULOSKELETAL:  She has some swelling of  her lower extremities at times.  Again she has the diabetic neuropathy,  particularly of her lower extremity.  She has had some orthopedic problems  of her left knee and she does have a retinopathy.   She suffers some depressions on Lexapro.  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   LABORATORY DATA:  Her admission labs show a hemoglobin of 7.5, hematocrit  22, white blood count of 9000.  Her sodium is 138, potassium 5.7, chloride  112, CO2 22, glucose of 163, BUN of 43, creatinine of 3.  Her total protein  is 5.3, albumin 1.8.   PHYSICAL EXAMINATION:  GENERAL:  She is a well-nourished white female, alert  and cooperative on physical exam.  VITAL SIGNS:  Her temperature is 98.7, pulse is 65, blood pressure 130/70.  ABDOMEN:  Benign.  EXTREMITIES:  She has palpable femoral, dorsalis pedis, and posterior tibial  pulses.  She has some mild redness to her right leg.  On the sole of the  ball of her foot she has a 3 cm intact ulcer that seems like it has some  purulence under the ulcer.  She has a ruptured ulcer of her heel which is  about 4 cm in diameter and she has a small blood blister of her left foot.   IMPRESSION:  1. Infected blister, right foot.  Plan to debride this at the bedside and     then start wound care on her.  2. Anemia.  It is unclear why her hemoglobin is so low.  3. Chronic renal insufficiency with elevated creatinine.  4. Insulin dependent diabetes mellitus with neuropathy, renal function,     peripheral neuropathy.  5. Hypertension.  6. Arthritic complaints.                                               Sandria Bales. Ezzard Standing, M.D.    DHN/MEDQ  D:  02/03/2003  T:  02/03/2003  Job:  [id number]   cc:   Al Decant. Janey Greaser, M.D.  [address]  [place]  [place] [address]  Fax: [phone]   Cecille Aver, M.D.  [address]  [place]  [place] [address]  Fax: [phone]

## 2011-01-16 NOTE — H&P (Signed)
NAME:  [name], [name] NO.:  [id number]   MEDICAL RECORD NO.:  [id number]          PATIENT TYPE:  INP   LOCATION:  1826                         FACILITY:  [facility]   PHYSICIAN:  Terrial Rhodes, M.D.DATE OF BIRTH:  1966/05/19   DATE OF ADMISSION:  12/01/2005  DATE OF DISCHARGE:                                HISTORY & PHYSICAL   CHIEF COMPLAINT:  Chest pain.   HISTORY OF PRESENT ILLNESS:  The patient is a 45 year old Caucasian female  with multiple medical problems outlined below who had her first episode of  chest pain today.  She describes the sensation as heavy across her chest.  It was 3-4/10 when it was at its worse.  It is decreased now at the time  interview in the emergency room.  She has had no nausea and no vomiting and  no radiation of the pain.  The pain started at around 1 p.m. today while she  was walking.  She was short of breath when the heaviness initially started.  It has since resolved.   PAST MEDICAL HISTORY:  1.  She has a history of end-stage renal disease.  She has hemodialysis on      Monday, Wednesday, Friday and Saturday scheduled in [place].  Her      last hemodialysis was yesterday November 30, 2005.  Estimated dry weight is      116 and she has a history of significant fluid accumulation in between      treatments with dialysis.  2.  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  3.  Obesity.  4.  Type 2 diabetes.  5.  Congestive heart failure.  6.  Anemia.  7.  Hyperparathyroidism.  8.  Hypertension.  9.  History of major depressive disorder.  10. Neuropathy.  11. Retinopathy.  12. Gastroparesis.   FAMILY HISTORY:  Mother's side of the family is noted for diabetes and  hypertension.  The father's side of the family has diabetes.  Her father had  coronary artery disease with angioplasty.   SOCIAL HISTORY:  She does not smoke.  She has not used alcohol since going  on dialysis.  She uses no illicit drugs.   She lives with a fiancee.   ALLERGIES:  The patient states she has an allergy to MORPHINE.  Her reaction  to this is unknown.   MEDICATIONS:  1.  Insulin 75/25 80 units b.i.d.  2.  PhosLo t.i.d.  3.  Lyrica 50 mg p.o. b.i.d.   REVIEW OF SYSTEMS:  The patient has numbness secondary to a neuropathy that  is unchanged.  The pertinent positives are listed above.  Otherwise review  of systems are noncontributory.   PHYSICAL EXAMINATION:  VITAL SIGNS:  Temperature 97.8, pulse 89, respiratory  rate 18, blood pressure 144/65.  GENERAL:  No apparent distress, obese.  NECK:  Supple.  CHEST:  Clear to auscultation bilaterally.  Pain is not produced with chest  wall palpation. Other exam of the chest wall shows that the catheter site is  clean and intact.  HEART:  Regular rate and rhythm.  No murmur  appreciated.  ABDOMEN:  Soft and nontender.  Positive bowel sounds.  No bruit appreciated.  GENITOURINARY:  Deferred.  EXTREMITIES:  First Ray amputation of the right foot.  She has chronic skin  changes of the feet.  1+ edema on the bilateral lower extremities.  She has  right upper extremity graft with a bruit.  SKIN:  Small healing ulcer on the left foot at the head of the first  metatarsal.  There is no erythema associated with it.  NEUROLOGICAL:  Grossly intact motor.  No asterixis.  She does have decreased  sensation of the bilateral lower extremities.   LABORATORY DATA:  BNP 213.  D-dimer 0.64.  White count 7, hemoglobin 14.9,  platelets 84,000.  Point of care markers negative x1.  I-stat electrolytes  show sodium 127, potassium 6.2, chloride 97, bicarbonate 27, BUN 63,  creatinine 7, glucose 455, albumin 3.5, alkaline phosphate 207.  Other LFTs  are within normal limits.  Chest x-ray shows no acute infiltrate.  She does  have low lung volumes.  There is slight subluxation of the vessels noted.  EKG increased T-waves.  She also has slight ST depression in the lateral  leads.    ASSESSMENT/PLAN:  1.  The patient is a 45 year old female with the following problems, chest      pain:  I will admit her to a telemetry bed and rule her out for a      myocardial infarction with serial enzymes and electrocardiograms.  We      will use O2 to keep saturations greater than 94% and obtain a cardiology      consult.  She does have a high vascular risk given her end-stage renal      disease, diabetes, and her congestive heart failure.  Gastroesophageal      reflux disease and gastrointestinal symptoms are within the differential      given her gastroparesis.  We will follow her for symptoms. Pneumonia,      pneumothorax, and pulmonary embolus are all unlikely.  We will also      check a thyroid-stimulating hormones as part of her rule out.  We will      treat her with aspirin 325 mg p.o. daily and check a fasting lipid panel      in the morning.  2.  End-stage renal disease:  We will arrange for hemodialysis tonight for      her fluid overload and also for hyperkalemia.  See orders.  We will put      her on a renal/low carbohydrate diet.  Check blood cultures x2 and      recheck a renal panel in the morning.  3.  Hyperthyroidism:  Check renal panel at hemodialysis in the morning.  4.  Anemia:  We will get outpatient records from hemodialysis regarding her      Epogen dosage.  This can be followed up tomorrow.  5.  Diabetes:  Check complete blood glucoses q.a.c. and q.h.s. with home      doses insulin and also sliding scale insulin.  Lyrica for neuropathy.  6.  Hypertension:  Follow blood pressure.      Dwana Curd Para March, M.D.    ______________________________  Terrial Rhodes, M.D.    GSD/MEDQ  D:  12/01/2005  T:  12/01/2005  Job:  [id number]

## 2011-01-16 NOTE — Consult Note (Signed)
NAME:  Tonya, [name] NO.:  [id number]   MEDICAL RECORD NO.:  [id number]          PATIENT TYPE:  INP   LOCATION:  5035                         FACILITY:  [facility]   PHYSICIAN:  Melvyn Novas, M.D.  DATE OF BIRTH:  Apr 14, 1966   DATE OF CONSULTATION:  DATE OF DISCHARGE:                                   CONSULTATION   [name] is a 45 year old Caucasian female with a history of  diabetes mellitus type 2 since childhood.  The patient has lost her renal  function and is in end-stage renal disease, hemodialysis dependent for over  seven years.  Her dialysis days are Mondays, Wednesdays, and Fridays.  The  patient had been in dialysis yesterday when she tried to turn in bed and  noticed that her lower extremities did not react and felt weak.  She alarmed  her dialysis technician to this, and although she never lost consciousness  it appears that she had a generalized weakness that was more dominantly seen  in her lower extremities but that her arms also were perceived as weak.  This led to the transfer here to [facility] right after dialysis  treatment.  The patient was at the time severely volume overloaded and  hypotensive.   The patient now states that her upper extremities are working quite fine and  that she still has some weakness in her lower extremities bilaterally.  I  see no evidence of any mental status change.  The patient is alert and  oriented x 3.  As to her cranial nerve examination, her pupils do not react  to light.  She does have severely scarred retinae due to diabetic  retinopathy.  She has a very coarse-appearing skin and is somewhat puffy.  Tongue and uvula are midline.  Extraocular movements are intact.  Eye  closure is bilaterally symmetric.  Neck range of motion is full.  The  patient has no carotid bruit.   Motor examination shows equal grip strength bilaterally.  The patient can  extend both arms and do a finger-nose  without dysmetria, ataxia, or tremor.  There is no asterixis noted.  As to the lower extremities, she can extend  both antigravity barely for 10 seconds.  She can dorsiflex and plantar flex,  but does not seem to provide much strength in either movement.  Adduction  and abduction of the hip and thigh are intact.  The patient did a heel-to-  shin test and actually was pretty well able to coordinate her movements.  It  did, however, show that she has hip flexion weakness, and this is not  associated with any pain.  Sensory is decreased to primary modalities in a  length-dependent peripheral polyneuropathy pattern.   Deep tendon reflexes are severely decreased.  The patient does not respond  to a Babinski maneuver and is status post hallux amputation on the right  foot.   The patient's back is not tender to palpation, although she recently had an  epidural and had also a paraspinal furuncle.  This was surgically removed.  In her surgical history, it is further mentioned that  she had a vulvar ulcer  that has been well healed.   PAST MEDICAL HISTORY:  1. End-stage renal disease secondary to childhood diabetes onset but type      2, and she has been on hemodialysis for two years now.  She is aware of      renal failure developing for over seven years.  2. She has a history of hypertension, but recently developed hypotension.  3. She is diagnosed with retinopathy, nephropathy, polyneuropathy,      gastroparesis.  4. Hyperparathyroidism secondary to end-stage renal disease.  5. History of depression.     SOCIAL HISTORY:  The patient is disabled.  Has a fiance.  No children.  She  does not smoke, does not use alcohol.   MEDICATIONS:  1. Calcitriol 0.5 mg IV daily.  2. Epogen with every dialysis treatment, 1,000 units.  3. Lantus insulin.  She is on a sliding scale during the day.  4. Takes Nephro-Vite one daily.  5. Oxycodone p.r.n.  6. She was on Lyrica at home, she believes 50 mg  b.i.d. on non-dialysis      day, but seems to have felt that her blood pressures were lower on      Lyrica.     ALLERGIES:  1. MORPHINE, which gives her severe nausea.  2. SULFATE.     FAMILY HISTORY:  Positive for diabetes mellitus and heart disease in her  mother.  Her father also has coronary artery disease, but no diabetes  mellitus.  There were no siblings mentioned.   PHYSICAL EXAMINATION:  VITAL SIGNS:  Temperature 97; heart rate 75;  respiratory rate 16-18; blood pressure today 100/58, which is still low,  given that the patient is somewhat volume overloaded.  She oxygenates well  on room air, 97%.  The patient's last capillary blood sugar was 74.   MRI was reviewed for the lumbar spine, as was ordered by Dr. Arrie Aran.  It  shows no abnormality of significance.  There is osteoporosis which is  advanced for the patient's age and explained by her renal disease, and disc  desiccation as well as bulging disc but no fragmentation or herniation and  no impingement on cord or nerve roots is seen.   LABORATORIES:  See e-chart.   PLAN:  Since the patient has mostly hip flexor weakness, thigh weakness, but  no clear sensory deficit, I had suggested to obtain an MRI of the thoracic  spine.  I would like it to be followed outpatient by nerve conduction  studies.  I do think she suffers from a severe degree of polyneuropathy,  diabetic induced, and we will also check a B-12 level, CK, CK-MB, and  troponin.  I would like for her to be evaluated by PT and OT, and I think  that whatever happened during her dialysis was a hypotensive episode that  she is at least partially recovered from.  I hope there is no central cord  abnormality seen on the thoracic spine, and I would otherwise not order any  additional imaging studies.  There is also need for an EEG or brain scan at  this time.     CD/MEDQ  D:  01/27/2005  T:  01/27/2005  Job:  [id number]   cc:   Terrial Rhodes, M.D.  [address]  [place]  [place] [address]  Fax: [phone]

## 2011-01-16 NOTE — Op Note (Signed)
NAME:  [name], [name]           ACCOUNT NO.:  [id number]   MEDICAL RECORD NO.:  [id number]          PATIENT TYPE:  OBV   LOCATION:  1825                         FACILITY:  [facility]   PHYSICIAN:  Larina Earthly, M.D.    DATE OF BIRTH:  1966-06-29   DATE OF PROCEDURE:  06/28/2005  DATE OF DISCHARGE:                                 OPERATIVE REPORT   PREOPERATIVE DIAGNOSIS:  Occluded left forearm loop AV Gore-Tex graft.   POSTOPERATIVE DIAGNOSIS:  Occluded left forearm loop AV Gore-Tex graft.   PROCEDURES:  Thrombectomy of left forearm loop AV Gore-Tex graft, revision  anastomosis, exploration of arterial anastomosis and intraoperative  arteriogram.   SURGEON:  Larina Earthly, M.D.   ASSISTANT:  Nurse.   ANESTHESIA:  General endotracheal anesthesia.   COMPLICATIONS:  None.   DISPOSITION:  To recovery room stable.   DESCRIPTION OF PROCEDURE:  The patient was taken to the operating room and  placed in the supine position where the left arm prepped and draped in usual  sterile fashion.  Incision was made over the prior venous anastomosis  extension to the basilic vein. The graft was opened through a prior revision  site and was thrombectomized. There was a widely patent vein above this.  A  #5 dilator passed with mild resistance through the venous anastomosis. The  graft itself was thrombectomized and a good inflow was encountered. The  graft was flushed with heparinized saline and reoccluded. Next, the vein was  exposed further proximally and was occluded with a vascular clamp. The old  venous anastomosis was excised. A new interposition graft of 7 mm Gore-Tex  was brought onto the field, was spatulated and sewn end-to-end to the vein  with a running 6-0 Prolene suture and then end-to-end to the old graft with  a running 6-0 Prolene suture. Clamps removed and a good flow was noted. An  intraoperative arteriogram was obtained through a 21-gauge butterfly needle  and this showed  widely patent graft throughout its course. There was a  intimal flap near the arterial anastomosis.  For this reason, the incision  was made over the antecubital space and the arterial anastomosis was  exposed. A prior thrombectomy site was reopened after occluding the brachial  artery proximally and distally. The old incision was reopened, the intimal  flap was removed and no clot was noted at the arterial anastomosis. A #4  dilator passed easily through the proximal anastomosis. The graft was  reoccluded and flushed with heparinized saline. The incision of the graft  closed with a running 6-0 Prolene suture. Clamps removed and excellent  thrill was noted. The wounds were irrigated with saline.  Hemostasis with  electrocautery.  Wounds were closed with 3-0 Vicryl in the subcutaneous and  subcuticular tissue. Benzoin and Steri-Strips were applied. The patient was  given 3000 units of intravenous heparin during the procedure and this was  not reversed.      Larina Earthly, M.D.  Electronically Signed     TFE/MEDQ  D:  06/28/2005  T:  06/28/2005  Job:  [id number]

## 2011-01-16 NOTE — Op Note (Signed)
NAME:  [name], [name]           ACCOUNT NO.:  [id number]   MEDICAL RECORD NO.:  [id number]          PATIENT TYPE:  AMB   LOCATION:  SDS                          FACILITY:  [facility]   PHYSICIAN:  Larina Earthly, M.D.    DATE OF BIRTH:  1966-01-06   DATE OF PROCEDURE:  04/27/2005  DATE OF DISCHARGE:  04/27/2005                                 OPERATIVE REPORT   PREOPERATIVE DIAGNOSIS:  End-stage renal disease.   POSTOPERATIVE DIAGNOSIS:  End-stage renal disease.   PROCEDURE:  Placement of the left forearm loop arteriovenous Gore-Tex graft.   SURGEON:  Larina Earthly, M.D.   ASSISTANT:  Pecola Leisure, PA-C.   ANESTHESIA:  MAC.   COMPLICATIONS:  None.   DISPOSITION:  To recovery room stable.   PROCEDURE IN DETAIL:  The patient was taken to the operating room and placed  in the supine position, where the area of the left arm was prepped and  draped in the usual sterile fashion.  Incision was made over the antecubital  space and carried down to isolate the prior AV fistula anastomosis.  Next, a  separate incision was made over the distal forearm.  An incision was then  made over the medial aspect of the basilic vein, and this was of moderate  caliber.  A 6 mm standard wall stretch graft was brought through the tunnel.  The basilic vein was occluded proximally and distally and was opened with an  11 blade and extended longitudinally with Potts scissors.  The graft was  spatulated and sewn end-to-side to the vein with a running 6-0  Prolene  suture.  Clamps were removed and the graft was flushed with heparinized and  reoccluded.  Next, the brachial artery was occluded proximally and distally  over the old venous anastomosis.  The vein was transected and the graft was  cut to the appropriate length and sewn end-to-side to the brachial artery at  the antecubital space.  Clamps were removed and a good thrill was noted.  The patient did have hypotension throughout the case.  The wounds  were  irrigated with saline and hemostasis with electrocautery.  Wounds were  closed with 3-0 Vicryl in the subcutaneous and subcuticular and Benzoin and  Steri-Strips were applied.      Larina Earthly, M.D.  Electronically Signed     TFE/MEDQ  D:  04/27/2005  T:  04/28/2005  Job:  [id number]

## 2011-01-16 NOTE — Op Note (Signed)
NAME:  [name], [name]                       ACCOUNT NO.:  [id number]   MEDICAL RECORD NO.:  [id number]                   PATIENT TYPE:  OIB   LOCATION:  2858                                 FACILITY:  [facility]   PHYSICIAN:  Larina Earthly, M.D.                 DATE OF BIRTH:  08-Oct-1965   DATE OF PROCEDURE:  08/14/2003  DATE OF DISCHARGE:                                 OPERATIVE REPORT   PREOPERATIVE DIAGNOSIS:  End-stage renal disease with competing branch of  left arm arteriovenous fistula.   POSTOPERATIVE DIAGNOSIS:  End-stage renal disease with competing branch of  left arm arteriovenous fistula.   OPERATION PERFORMED:  Ligation of competing branch of left arm arteriovenous  Gore-Tex graft.   SURGEON:  Larina Earthly, M.D.   ASSISTANT:  Nurse.   ANESTHESIA:  MAC.   COMPLICATIONS:  None.   DISPOSITION:  To recovery stable.   DESCRIPTION OF PROCEDURE:  The patient was taken to the operating room and  placed in supine position where the area of the left arm was prepped and  draped in the usual sterile fashion. The patient had a prior shuntogram  which revealed a competing branch of the upper arm fistula.  Incision was  made using local anesthesia lateral to the vein, carried down to isolate the  branch which was ligated with 2-0 silk ties.  A Doppler was used to assure  no further competing branches at this level.  The wound was irrigated with  saline.  Hemostasis electrocautery.  Wound closed with 3-0 Vicryl in the  subcutaneous and subcuticular tissue.  Benzoin and Steri-Strips applied.                                               Larina Earthly, M.D.    TFE/MEDQ  D:  08/14/2003  T:  08/14/2003  Job:  [id number]

## 2011-01-16 NOTE — Op Note (Signed)
NAME:  [name], [name]           ACCOUNT NO.:  [id number]   MEDICAL RECORD NO.:  [id number]          PATIENT TYPE:  OIB   LOCATION:  5530                         FACILITY:  [facility]   PHYSICIAN:  Janetta Hora. Fields, MD  DATE OF BIRTH:  August 06, 1966   DATE OF PROCEDURE:  03/21/2005  DATE OF DISCHARGE:                                 OPERATIVE REPORT   PROCEDURE:  1.  Attempted thrombectomy left brachial cephalic AV fistula.  2.  Shuntogram x 2.  3.  Neck ultrasound.  4.  Diatek catheter insertion.   PREOPERATIVE DIAGNOSIS:  Thrombosed AV fistula with end-stage renal disease.   POSTOPERATIVE DIAGNOSIS:  Thrombosed AV fistula with end-stage renal  disease.   ANESTHESIA:  Local with IV sedation.   ASSISTANT:  Nurse.   FINDINGS:  1.  Thrombosed AV fistula with high-grade stenosis and multiple      pseudoaneurysms.  2.  23 cm Diatek catheter right internal jugular vein.   OPERATIVE DETAIL:  After obtaining informed consent, the patient taken to  the operating room. Patient placed in supine position operating table. After  adequate sedation, the patient's entire left upper extremity prepped and  draped in the usual sterile fashion. Local anesthesia was infiltrated over  the fistula at a location and the lower third of the fistula where of a  pulse could be palpated in the fistula proximally but not distally. There  were multiple scars in this area from frequent needle stick sites.  Longitudinal incision was made over the fistula carried down through  subcutaneous tissues and the fistula was dissected free circumferentially.  There was a pulse in the fistula proximally but not distally. Proximal and  distal control was obtained of the fistula with fistula clamps. Transverse  venotomy was made and the fistula thrombectomized distally. A large amount  of clot was removed from the vein. Next the proximal portion was  thrombectomized. There was good arterial inflow. The venotomy was  then  repaired using running 6-0 Prolene suture.  Just prior to completion,  anastomosis was fore bled, back bled and thoroughly flushed. Next,  intraoperative shuntogram was obtained by introducing a 21 gauge butterfly  needle into the proximal portion of the fistula. With inflow occlusion there  was no distal filling of the fistula. The fistula was also inspected with  Doppler and found to have no flow distally. Therefore, the patient was given  5000 units of intravenous heparin. The fistula was again clamped proximally  and distally after removing and needle. There was more thrombus found within  the fistula. It was again thrombectomized distally into the vein. There was  no more clot found proximally in the arterial side. Venotomy was then  reapproximated again with running 6-0 Prolene suture. Again fore bled, back  bled, and thoroughly flushed. After this was secured, a repeat shuntogram  was obtained again by introducing 21 gauge butterfly needle into the  fistula. With inflow occlusion, it could be seen that there was a high-grade  stenosis in the midportion of the fistula.  Due to the patient's morbid  obesity, the upper portion of the arm would  not be satisfactory to use as a  fistula and so an attempt was made to expose the fistula further to find the  area of high-grade stenosis. Incision was lengthened up the arm carried down  through subcutaneous tissues and a more distal fistula was dissected free.  There was a large pseudoaneurysm in this area. Due to a proximal  pseudoaneurysm and a pseudoaneurysm in this site. It was decided that these  two in addition to the narrowing and depth of the fistula distally, that it  would  not be salvageable. Therefore it was ligated distally with 2-0 silk  tie.  The proximal portion of fistula was oversewn with a running 5-0  Prolene suture. Next, hemostasis was obtained. Subcutaneous tissues  reapproximated using running 2-0 and 3-0 Vicryl  suture. Skin closed with 4-0  Vicryl subcuticular stitch. Attention was then turned to the neck. The  patient's entire neck and chest prepped, draped usual sterile fashion.  A  [employer] ultrasound was used to inspect the right internal jugular vein.  Right internal jugular vein was found be widely patent with normal  respiratory variability and compressibility. Next,  next the patient's  entire neck and chest were prepped and draped usual sterile fashion. Local  anesthesia was infiltrated over the right internal jugular vein. Small  finder needle was used to localize right internal jugular vein. A larger  introducer needle used to cannulate the right internal jugular vein. A 0.35  J-tip guide were then threaded through the right internal jugular vein and  down the inferior vena cava under fluoroscopic guidance.  Next, sequential  12, 14 and 16-French dilators with peel-away sheath were placed over the  guidewire into the right atrium. Dilator and guidewire was removed.  The 23  cm Diatek catheter was placed in the right atrium through the peel-away  sheath. The peel-away sheath was then removed. Catheter was then tunneled  subcutaneously, cut to length, and the hub attached. Catheter was sutured to  the skin with nylon sutures and neck insertion site closed with Vicryl  stitch. Catheter was inspected under fluoroscopy and found with its tip to  be in the right atrium. No kinks throughout its course. The patient  tolerated procedure well. There were no complications. Instrument, sponge,  needle counts correct at end of the case. The patient was taken to recovery  room in stable condition.       CEF/MEDQ  D:  03/21/2005  T:  03/22/2005  Job:  [id number]

## 2011-01-16 NOTE — Op Note (Signed)
NAME:  [name], [name]           ACCOUNT NO.:  [id number]   MEDICAL RECORD NO.:  [id number]          PATIENT TYPE:  AMB   LOCATION:  SDS                          FACILITY:  [facility]   PHYSICIAN:  [name], M.D.DATE OF BIRTH:  1966-01-26   DATE OF PROCEDURE:  09/15/2005  DATE OF DISCHARGE:                                 OPERATIVE REPORT   PREOPERATIVE DIAGNOSIS:  Chronic renal failure with clotted left upper arm  AV graft.   POSTOPERATIVE DIAGNOSIS:  Chronic renal failure with clotted left upper arm  AV graft.   PROCEDURE:  Thrombectomy of left upper arm AV graft with insertion of new  segment graft in the more proximal axillary vein.   SURGEON:  [name], M.D.   ASSISTANT:  Pecola Leisure, PA   ANESTHESIA:  General.   TECHNIQUE:  The patient was taken to the operating room and received a  general anesthetic. The left upper extremity was prepped and draped in the  usual sterile fashion. After this incision in the axilla was opened, the  venous limb of the graft was dissected free and the more proximal vein had  to be explored, as there was intimal hyperplasia at the venous anastomosis.  The graft was divided. The graft adjacent to the vein was ligated with 2-0  silk tie.   The more proximal vein was dissected up and I had to go very high in the  axilla in order to find an adequate vein for outflow. With some difficulty I  was, however, able to sew a 6-mm PTFE graft which had been spatulated end-to-  end to the spatulated vein more proximally using a continuous 6-0 Prolene  suture. Again, I was very high in the axilla; and I did not think that it  would be technically possible to get above this. A graft thrombectomy was  then achieved after the patient was heparinized. There were no problems in  pulling the catheter through the body of the graft. The arterial plug was  clearly retrieved. The graft was flushed with heparinized saline and  clamped.  The new segment of graft was cut to the appropriate length and  anastomosed end-to-end to the old graft with continuous 6-0 Prolene suture.  A the completion, there was a good thrill in the graft. Hemostasis was  obtained in the wound.  The wound was closed with a deep layer of 3-0  Vicryl; and the skin closed with 4-0 Vicryl. Sterile dressing was applied.  The patient tolerated the procedure well; and was transferred to recovery  room in satisfactory condition. All needle and sponge counts were correct.      [name], M.D.  Electronically Signed     CSD/MEDQ  D:  09/15/2005  T:  09/15/2005  Job:  [id number]

## 2011-01-16 NOTE — H&P (Signed)
NAME:  SHERREY, NORTH NO.:  [id number]   MEDICAL RECORD NO.:  [id number]                   PATIENT TYPE:  INP   LOCATION:  1824                                 FACILITY:  [facility]   PHYSICIAN:  Cecille Aver, M.D.        DATE OF BIRTH:  06/03/1966   DATE OF ADMISSION:  04/08/2003  DATE OF DISCHARGE:                                HISTORY & PHYSICAL   HISTORY OF PRESENT ILLNESS:  Ms. 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  She also has hypertension, status  post ray amputation for infected toes on antibiotics and dressing changes.  She presents to the emergency department today with a one to two-week  history of worsening lower extremity edema, shortness of breath and dyspnea  on exertion.  She reports that her breathing is so bad it is difficult to  ambulate.  Also with decreased appetite and excessive fatigue and inability  to perform ADLs.  She reports taking her Lasix as directed, being compliant  with a low salt diet and fluid restriction.   PAST MEDICAL HISTORY:  1. Insulin-dependent diabetes mellitus which has been long-standing and     under variable control.  2. Hypertension.  3. Toe infections/cellulitis status post ray amputation with wound infection  4. Obesity.  5. Depression.   CURRENT MEDICATIONS:  1. PhosLo two with meals.  2. Lopressor of unknown dose.  3. Norvasc of unknown dose.  4. Toprol of unknown dose.  5. Lexapro 20 mg daily.  6. Lasix 240 mg b.i.d.  7. Lantus Insulin 40 units daily.  8. Nephro-Vite per PIC line b.i.d.  9. Cipro per PIC line b.i.d.   ALLERGIES:  No known drug allergies.   SOCIAL HISTORY:  The patient did work as an Tourist information centre manager, she  now is on disability.  She denies any tobacco or  significant alcohol use.  She is in the emergency department with her boyfriend.   FAMILY HISTORY:  Noncontributory.   REVIEW OF SYSTEMS:  Positive shortness of breath, dyspnea on exertion,  paroxysmal nocturnal dyspnea and lower extremity edema.  Negative for  fevers, chills, nausea, vomiting, diarrhea, constipation, hesitancy,  frequency, history of kidney stones.  Otherwise review of systems is  negative.   PHYSICAL EXAMINATION:  VITAL SIGNS: Temperature is 98.0, blood pressure  142/83, heart rate is 60 and respirations of 20. Oxygen saturation is 99% on  room air.  GENERAL: This is an obese white female who is oriented and in no acute  distress.  HEENT: Pupils are equal, round and reactive to light.  Extraocular movements  are intact. Mucous membranes are moist.  NECK: There is jugular venous distension.  LUNGS: Decreased breath sounds at the bases.  CARDIOVASCULAR: Regular rate and rhythm without murmur, gallop, or  rub.  ABDOMEN: Obese, positive bowel sounds, positive abdominal wall edema.  Soft,  nontender, nondistended.  EXTREMITIES: She has 2+ edema up to the abdominal wall.  Status post ray  amputation on the right, still having cellulitis.  Left upper extremity A-V  fistula with a good thrill and bruit, not fully developed.  NEUROLOGIC: The patient is alert and oriented and __________  secondary to  edema.   LABORATORY DATA:  White blood count 6.7 thousand, hemoglobin 10.6.  Potassium 4.4, BUN and creatinine 70 and 3.1 with glucose of 114, albumin of  3.3.  Chest x-ray shows cardiomegaly with some interstitial edema.   ASSESSMENT:  A 45 year old white female with diabetic nephropathy and volume  overload.  1. Diabetic nephropathy with congestive heart failure and volume overload     failing outpatient diuretic management.  I will attempt an aggressive     diuretic regimen with Zaroxolyn plus Lasix and follow I&O's.  Creatinine     is at baseline for patient, difficult  to tell if she is having uremic     symptoms or if she requires dialysis at this time.  2. Electrolytes: I will check a PTH and follow her phosphorus and potassium.  3. Anemia: Continue Aranesp then check iron stores.  4. Foot ray amputation, will continue dressing changes and IV Cipro.  5. Diabetes mellitus: Carbohydrate modified diet and insulin, Accu-Checks.                                                Cecille Aver, M.D.    Vista Lawman  D:  04/08/2003  T:  04/09/2003  Job:  [id number]

## 2011-01-16 NOTE — Discharge Summary (Signed)
NAME:  [name], [name] NO.:  [id number]   MEDICAL RECORD NO.:  [id number]          PATIENT TYPE:  INP   LOCATION:  5522                         FACILITY:  [facility]   PHYSICIAN:  Garnetta Buddy, M.D.   DATE OF BIRTH:  July 19, 1966   DATE OF ADMISSION:  01/24/2006  DATE OF DISCHARGE:                                 DISCHARGE SUMMARY   DISCHARGE DIAGNOSES:  1.  Febrile illness, etiology unclear.  2.  Insulin-dependent diabetes mellitus.  3.  Anemia of chronic disease.  4.  Secondary hyperparathyroidism.  5.  New onset of anxiety and depression.   PROCEDURES:  1.  Jan 26, 2006:  Right IJ Diatek removal by CVTS.  2.  Jan 26, 2006:  Right arm AV fistulogram.  Impression:  Successful      dilation of the cephalic vein stenosis to 7 mm.  There was a persistent      area of 30% narrowing.  3.  Hemodialysis.   CONSULTATIONS:  CVTS.   HISTORY OF PRESENT ILLNESS:  Ms. [name] is a 45 year old white female  with end-stage renal disease who dialyzes in [place] on Monday,  Wednesday, Friday, Saturdays, four times a week due to fluid gains.  She  dialyzes with a right IJ catheter and she presented to the emergency room  with abrupt onset of fevers and chills on May 27.  In the ER she received  1.5 g of vancomycin and 2 g of Fortaz.  She also has a maturing AV fistula  right forearm.   ADMISSION LABORATORY:  Wbc's 9.7, hemoglobin 13.8.  UA 11-20 with wbc's.  Potassium 6.8, glucose 181.  Admission chest x-ray is negative.   HOSPITAL COURSE:  #1 - FEBRILE ILLNESS.  The patient was admitted and  continued on IV vancomycin as well as [place].  Reasons for sepsis was worked  up to include blood cultures x2, urinalysis and chest x-ray which were all  negative.  The patient's temperatures did seem to be positive on dialysis.  Secondary to a matured AVF and status post shuntogram with results above,  PermCath was removed by CVTS without complications.  Her fevers are better.  No known etiology has been found at this time.   #2 - ANEMIA OF CHRONIC DISEASE.  The patient continued on her Epogen during  this admission.  There were no changes and no transfusions.   #3 - INSULIN DEPENDENT DIABETES MELLITUS.  The patient had some low blood  sugars initially, probably secondary to infection.  The 70/30 insulin was  adjusted and is now back to her usual outpatient dose at 8 units twice a  day.  The patient does have a difficult time controlling blood sugars as an  outpatient.   #4 - END-STAGE RENAL DISEASE.  The patient continued with hemodialysis  regular prescription this admission.   #5 - NEW ONSET ANXIETY AND DEPRESSION.  During this admission the patient  was started on Wellbutrin 100 mg daily and is doing well with that.   #6 - SECONDARY HYPERPARATHYROIDISM.  The patient will continue on her  Renagel binders as well as her vitamin  D.   PATIENT INSTRUCTIONS:  She will clean her right chest wound with soap and  water.  She will stay on a renal as well as diabetic diet.  She may shower  and bathe and increase her activity slowly.   HEMODIALYSIS ORDERS:  No change to hemodialysis prescription except a new  estimated dry weight at 120.5 kg.  She will continue vancomycin 1 g IV each  hemodialysis treatment through February 05, 2006, and also continue [place] 2 g IV  each hemodialysis treatment through February 05, 2006.  IF FEVERS CONTINUE, THE  PATIENT MAY NEED TO HAVE A TEE.  I have made the charge nurse at the  dialysis center aware of this.      Azucena Fallen, [place]      Garnetta Buddy, M.D.  Electronically Signed    MY/MEDQ  D:  01/29/2006  T:  01/29/2006  Job:  [id number]   cc:   CVTS   [name]

## 2011-01-16 NOTE — Op Note (Signed)
NAME:  [name], [name]                     ACCOUNT NO.:  [id number]   MEDICAL RECORD NO.:  [id number]                   PATIENT TYPE:  AMB   LOCATION:  SDC                                  FACILITY:  [facility]   PHYSICIAN:  Richardean Sale, M.D.                DATE OF BIRTH:  25-Jul-1966   DATE OF PROCEDURE:  02/28/2004  DATE OF DISCHARGE:                                 OPERATIVE REPORT   PREOPERATIVE DIAGNOSES:  1. Menometrorrhagia.  2. Thickened endometrium.  3. Endometrial polyps on ultrasound.   POSTOPERATIVE DIAGNOSES:  1. Menometrorrhagia.  2. Endometrial polyp.   PROCEDURE:  Fractional D&C.   SURGEON:  Richardean Sale, M.D.   ANESTHESIA:  Conscious sedation with paracervical block.   COMPLICATIONS:  None.   ESTIMATED BLOOD LOSS:  Minimal.   FINDINGS:  Midline uterus, small, mobile and nontender both before and after  the procedure.  No adnexal masses.  Multiple endometrial polyps obtained on  curettage.   SPECIMENS:  Endometrial and endocervical curettings sent to pathology.   INDICATIONS:  This is a 45 year old gravida 1, para 0-0-1-0 white female  with end-stage renal disease, secondary to poorly controlled diabetes.  She  has menses occurring every 14-21 days that last up to 14 days in length and  are significantly heavy such that she floods her clothes.  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  The patient underwent examination on  ultrasound in the office, which revealed a markedly thickened endometrium  and suggestion of an endometrial polyp, as well as endometrial fluids  present in the canal.  Given her poorly controlled diabetes and her obesity,  as well as her bleeding pattern -- she is at significant risk for  endometrial cancer.  She is currently on the waiting list to receive a  kidney and pancreatic transplants.  She has been medically cleared to  proceed with surgery by her  nephrologist Dr. Cecille Aver.   Prior to the procedure, the risks of the procedure were reviewed with the  patient and informed consent was obtained.  We discussed the risk of  hemorrhage requiring transfusion; injury to the uterus, which would require  additional surgery by laparoscopy or laparotomy to evaluate the bowel or  bladder and other intraabdominal organs, also repair of any injury that may  occur; infection, which could prolong her hospitalization, with possible  scarring of the uterus; anesthetic-related complications, including death.  The patient voiced the understanding of all the risks and agreed to proceed.  All questions were answered before proceeding to the OR.   DESCRIPTION OF PROCEDURE:  The patient was taken to the operating room,  where she was given conscious sedation.  She was then prepped and draped in  the usual sterile fashion with Hibiclens.  Placed then in dorsal supine  position and the bladder was drained with a red rubber  catheter.  Minimal  urine was obtained.  Bimanual examination confirmed the presence of a small  midline uterus, which was mobile with no obvious masses.  Adnexa were mobile  and nontender.   A speculum was then placed into the vagina; 2 cc of 1% Nesacaine were then  injected at the 12 o'clock position in the cervix, and the cervix was  grasped with a single-toothed tenaculum.  A paracervical block was then  administered using 1% Nesacaine, for a total of 20 cc.  After this the  cervix was then very cautiously dilated with Hegar dilators.  The uterus was  then sounded to 7.5 cm.  ECC was performed and the specimens were sent to  pathology, labeled as cervical curettings.   Endometrial curettage was then performed.  Findings included multiple  endometrial polyps and a moderate amount of tissue.  This was sent to  pathology and labeled as endometrial curettings.  Once curettage was  complete and a gritty texture was noted in  all four quadrants of the uterus,  the procedure was terminated.   The single-toothed tenaculum was then removed.  There was minimal bleeding  noted from the cervix.  The speculum was then removed from the patient's  vagina.  She was taken out of the dorsal lithotomy position, awakened and  taken to the recovery room in stable condition.  There were no  complications.                                               Richardean Sale, M.D.    JW/MEDQ  D:  02/28/2004  T:  02/28/2004  Job:  [id number]

## 2011-01-16 NOTE — H&P (Signed)
NAME:  [name], [name] NO.:  [id number]   MEDICAL RECORD NO.:  [id number]          PATIENT TYPE:  INP   LOCATION:  5522                         FACILITY:  [facility]   PHYSICIAN:  Garnetta Buddy, M.D.   DATE OF BIRTH:  1966-01-29   DATE OF ADMISSION:  01/24/2006  DATE OF DISCHARGE:                                HISTORY & PHYSICAL   HISTORY OF PRESENT ILLNESS:  A 45 year old, end-stage renal disease,  [place], Monday-Wednesday-Friday-Saturday four times a week dialysis due  to excessive fluid gains. She dialyzes with a right internal jugular  catheter, and she presented to the emergency room with abrupt onset of fever  and chills at 4 p.m. last evening. She received 1.5 g of vancomycin, 2 g of  Fortaz in the emergency room. She is a known diabetic. 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   PAST MEDICAL HISTORY:  1.  End-stage renal disease.  2.  History of ray amputation.  3.  History of obesity.  4.  History of diabetes mellitus type 2.  5.  History of anemia.  6.  History secondary hyperparathyroidism.  7.  History of depression.  8.  History of retinopathy.  9.  History of gastroparesis.   MEDICATIONS:  1.  Insulin 75/25 80 units b.i.d.  2.  PhosLo 667 mg 2 tablets q.a.c.  3.  Renagel 800 mg 2 tablets q.a.c.  4.  Lyrica 50 mg b.i.d.   ALLERGIES:  MORPHINE.   SOCIAL HISTORY:  No tobacco, no alcohol. Lives with fiance.   FAMILY HISTORY:  Positive for diabetes.   REVIEW OF SYSTEMS:  Positive for fever and chills. Positive for fatigue and  malaise. EYES:  Has a history of retinopathy. Wears glasses. No history of  visual loss. EARS, NOSE, MOUTH, AND THROAT:  No hearing loss, no sore  throat. CARDIOVASCULAR:  History of cardiac catheterization April 2007 Dr.  Elease Hashimoto, no history of coronary artery disease. No palpitations, syncope.  Does have a history of congestive heart failure. RESPIRATORY SYSTEM:  No  cough, wheeze, hemoptysis. No  dyspnea on exertion. ABDOMINAL SYSTEM:  Positive for nausea, vomiting and diarrhea. No abdominal pain. Appetite  good. UROGENITAL:  No significant urine output. No menstruation.  MUSCULOSKELETAL:  No joint pain or swelling. ENDOCRINE:  Positive for  diabetes. No thyroid disease. DERMATOLOGIC:  No skin rashes or itching.  HEMATOLOGIC/ONCOLOGIC:  No history of cancer, DVT or pulmonary embolus.  NEUROLOGICAL:  History of peripheral neuropathy. No history of strokes or  seizures.   PHYSICAL EXAMINATION:  GENERAL:  Alert, diaphoretic.  VITAL SIGNS:  Blood pressure 100/70, pulse 100, temperature 102.  HEENT:  Pale mucous membranes. Extraocular movements intact. Pupils are  round, equal and reactive. Ears, nose, mouth and throat:  __________ .  Oropharynx was clear.  NECK:  JVP was flat, supple, no meningismus.  CARDIOVASCULAR:  Regular rate and rhythm. No murmurs, rubs, or gallops.  RESPIRATORY:  Lung fields were clear to auscultation. No wheezes or rales.  ABDOMEN:  Soft, nontender. Bowel sounds were present.  EXTREMITIES:  No clubbing, cyanosis, or edema. She had  a ray amputation  without ulcers.  NEUROLOGICAL:  Alert and oriented.  Deep tendon reflexes - decreased glove-  stocking sensation in upper and lower extremities.   Catheter exit site no redness or discharge.   LABORATORY DATA:  Urinalysis:  11 to 20 WBCs. WBC 6.9, platelets 97,  hemoglobin 13.8. Sodium 127, potassium 6.8, chloride 100, CO2 23, BUN 80,  glucose 181. Chest x-ray negative. Urinalysis, blood cultures pending drawn  in the ER. Telemetry post dialysis revealed atrial flutter with 2 to 1  block.   ASSESSMENT AND PLAN:  1.  End-stage renal disease. Plan is hemodialysis Monday, Wednesday, Friday      usual schedule.  2.  Fever. Suspect catheter related bacteremia. However, urine appears to      have some pyuria. Will check urine culture. Will continue patient on      vancomycin and Fortaz.  3.  Anemia. No Epogen.   4.  Hyperkalemia. Potassium bath, low potassium diet.  5.  Diabetes mellitus. Insulin sliding scale, hold 75/25 at present.  6.  Secondary hyperparathyroidism. Plan is renal diet.  7.  History of atrial flutter. Will place in telemetry to monitor.      Garnetta Buddy, M.D.  Electronically Signed     MWW/MEDQ  D:  01/25/2006  T:  01/25/2006  Job:  [id number]

## 2011-01-16 NOTE — Op Note (Signed)
NAMEJAYLI, [name]            ACCOUNT NO.:  [id number]   MEDICAL RECORD NO.:  [id number]          PATIENT TYPE:  AMB   LOCATION:  SDS                          FACILITY:  [facility]   PHYSICIAN:  Quita Skye. Hart Rochester, M.D.  DATE OF BIRTH:  10/06/1965   DATE OF PROCEDURE:  01/05/2007  DATE OF DISCHARGE:  01/05/2007                               OPERATIVE REPORT   PREOPERATIVE DIAGNOSIS:  End stage renal disease with thrombosed  arteriovenous Gore-Tex graft, right upper arm.   POSTOPERATIVE DIAGNOSIS:  End stage renal disease with thrombosed  arteriovenous Gore-Tex graft, right upper arm.   OPERATION:  Thrombectomy arteriovenous Gore-Tex graft right upper arm  with insertion new segment of 6 mm graft to more proximal axillary vein.   SURGEON:  Quita Skye. Hart Rochester, M.D.   ASSISTANT:  Nurse   ANESTHESIA:  Local.   PROCEDURE:  The patient was taken to the operating room and placed in a  supine position at which time the right upper extremity was prepped with  Betadine scrub solution and draped in a routine sterile manner.  After  infiltration of 1% Xylocaine with epinephrine, a longitudinal incision  was made through the previous scar proximal to the axilla.  Gore-Tex to  axillary vein anastomosis was dissected free.  It was actually the high  brachial vein where the anastomosis had been done and was end-to-side in  nature.  The distal vein was oversewn with 5-0 Prolene and the graft was  easily thrombectomized with excellent in flow established.  The  anastomosis was excised and the vein exposed more proximally where it  was 5 mm in size.  A new piece of graft was anastomosed end-to-end to  the old graft and end-to-end to the proximal vein with 6-0 Prolene.  Clamps were then released.  There was a good pulse and thrill in the  graft.  No protamine was given.  The wound was irrigated with saline and  closed in layers with Vicryl in a subcuticular fashion.  A sterile  dressing was applied.   The patient was taken to the recovery room in  satisfactory condition.      Quita Skye Hart Rochester, M.D.  Electronically Signed     JDL/MEDQ  D:  01/05/2007  T:  01/05/2007  Job:  [id number]

## 2011-01-16 NOTE — Consult Note (Signed)
NAME:  BILLYE, PICKEREL NO.:  [id number]   MEDICAL RECORD NO.:  [id number]          PATIENT TYPE:  INP   LOCATION:  5522                         FACILITY:  [facility]   PHYSICIAN:  Cherylynn Ridges III, M.D.DATE OF BIRTH:  October 22, 1965   DATE OF CONSULTATION:  10/18/2004  DATE OF DISCHARGE:                                   CONSULTATION   Thank you very much for asking me to see Ms. [name], a pleasant  45 year old diabetic renal failure patient who developed a posterior vulvar  infection requiring drainage by you in the operating room at [facility] earlier today.   You asked me to evaluate this patient for signs of deeper or more serious  infection perhaps requiring further drainage.  I evaluated the patient at  the bedside with good lighting and was able to see the 2 cm opening in the  posterior vulvar area on the left side.  It had what appeared to be 1/4-inch  Nu Gauze packed inside.  The amount of packing could not be determined.  However, the area surrounding it appeared to still be somewhat indurated,  perhaps secondary to deep packing.  There was a mild to moderate vulvar  swelling on the left side.  However, there was no evidence of deep  fluctuance or deep soft tissue necrosis indicating of a necrotizing  fasciitis.  The patient looks very healthy, is very alert, and outside of  the discomfort from the packing and the drained area, she appears to be in  no distress.  I do not think further surgical treatment of this is necessary  at this time.  However, I do think the current packing should be removed in  two days, at which time the wound can be reevaluated.  It should be cleansed  with peroxide and saline, and the patient should also start sitz baths in  two days when the packing has been removed.  Will continue to her follow her  on the [facility] Surgical service as a surgical service consult.      JOW/MEDQ  D:  10/18/2004  T:   10/20/2004  Job:  [id number]   cc:   Richardean Sale, M.D.  [address]  [place]  [place] [address]  Fax: [phone]   Renal Service

## 2011-01-16 NOTE — Op Note (Signed)
NAME:  [name], [name] NO.:  [id number]   MEDICAL RECORD NO.:  [id number]          PATIENT TYPE:  INP   LOCATION:  5522                         FACILITY:  [facility]   PHYSICIAN:  Richardean Sale, M.D.   DATE OF BIRTH:  1966/06/23   DATE OF PROCEDURE:  10/18/2004  DATE OF DISCHARGE:                                 OPERATIVE REPORT   PREOPERATIVE DIAGNOSIS:  Vulvar abscess.   POSTOPERATIVE DIAGNOSIS:  Vulvar abscess.   PROCEDURE:  Incision and drainage of vulvar abscess with wound cultures.  Debridement of wound edge and packing of abscess.   SURGEON:  Richardean Sale, M.D.   ANESTHESIA:  Conscious sedation and local.   COMPLICATIONS:  None.   ESTIMATED BLOOD LOSS:  Minimal.   FINDINGS:  Erythematous changes of the left labia majora.  On the lateral  aspect of the labia majora, there is a 5 mm opening in the skin that is  draining purulent material.  Approximately 1 cm lateral to this is a 5 mm  area of dark, lightly dusky appearing tissue.  Palpation of the labia  revealed approximately 4 cm well-demarcated area of induration that extends  minimally into the vagina.  It does not tract up into the mons pubis or  laterally to the labia or crural fold.   SPECIMENS:  Wound skin edge.  Abscess contents sent to pathology.  Wound  cultures obtained.   COMPLICATIONS:  None.   INDICATIONS FOR PROCEDURE:  This is a 45 year old gravida 1, para 0-0-1-0,  white female with poorly controlled insulin-dependent diabetes mellitus and  subsequent renal failure who is on dialysis three days a week.  The patient  presented four days prior to this admission with complaints of a boil in  her vulva.  The patient underwent incision and drainage of this area in the  office and was started on oral antibiotic therapy with Clindamycin.  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  The patient was seen 24  hours later for follow-up.  The area was still draining slightly  and there  had been no change in the size of the abscess on palpation which was  approximately 3 cm.  There was some mild erythematous changes overlying the  vulva extending to the top of the vulva.  The patient followed up 36 hours  later for repeat evaluation which revealed no clinical improvement in the  appearance of the abscess.  It was still draining purulent material  spontaneously.  The patient has been on oral Clindamycin therapy for three  days and had one isolated temperature of 100.6.  The patient denies any  fever, chills, or sweats.  Complains of mild discomfort in the vulva.  Given  failure to respond to antibiotic therapy and drainage, the patient presents  today for I&D of the abscess with possible placement of a Wurd catheter or  packing under local anesthesia with conscious sedation.  Prior to the  procedure, the risks, benefits, and alternatives of the procedure were  reviewed with the patient and informed consent was obtained.  All questions  were  answered before proceeding to the OR.   DESCRIPTION OF PROCEDURE:  The patient was taken to the operating room where  she was given conscious sedation, placed in the dorsal lithotomy position,  and the vulva and vagina were prepped with sterile Betadine prep.  Examination in the operating room confirmed the persistence of the abscess  approximately 3 to 4 cm of indurated area was palpated.  It did not extend  up to the mons pubis or laterally to the labia crural fold.  The skin  surface where the area had been previously incised was still draining  purulent material.  Approximately 1 cm lateral to this, there was a small,  slightly dusky appearing area that was approximately 0.5 cm in diameter.  Local anesthesia was injected, 1% lidocaine with epinephrine in this area  and a 1 cm incision was made.  Upon making the incision, there was further  drainage of some purulent bloody fluid.  The area was cultured with both  aerobic  and anaerobic cultures.  The area was then carefully probed with a  sterile Hemostat.  Loculations were broken up using the Hemostat and there  was a well demarcated abscess cavity approximately 4 cm in maximum diameter  that did not extend outside the labia majora.  This area was thoroughly  irrigated with sterile saline.  The edge of the incision was inspected.  There was a slight dusky tinge to this, therefore, an elliptical incision  was then made to excise this tissue, this was sent to pathology labled as  wound edge.  There was good healthy tissue noted at the margins of this  incision.  The abscess cavity was then packed with sterile gauze and the  procedure was the terminated.  At this point, the patient was taken out of  the dorsal lithotomy position.  All needle, sponge, and instrument counts  correct x2.  The patient was awakened from her sedation and taken to the  recovery room awake and in stable condition.  There were no complications.  Given the intraoperative findings, we will admit for inpatient antibiotic  therapy and transfer the patient to [facility] for management of  her diabetes.  I have contacted Margaretmary Bayley, M.D., who is her  endocrinologist who has agreed to manage her diabetes while she is an  inpatient.  The patient is scheduled to undergo dialysis  Monday/Wednesday/Friday and if she remains an inpatient we will arrange for  dialysis at [facility].  In addition, given the findings of the  slightly dusky appearance to the wound edge, I have asked general surgery to  see the patient in consultation for management suggestions and to follow for  the development of a possible necrotizing infection.  At this point, there  is no obvious signs of necrotizing fasciitis, but given the patient's poorly  controlled diabetes, she is at significant risk for this and needs to be managed as an inpatient.  In addition, we will consult infectious disease  for  further antibiotic recommendations.      JW/MEDQ  D:  10/18/2004  T:  10/19/2004  Job:  [id number]

## 2011-01-16 NOTE — Op Note (Signed)
NAME:  [name], [name]           ACCOUNT NO.:  [id number]   MEDICAL RECORD NO.:  [id number]          PATIENT TYPE:  AMB   LOCATION:  SDS                          FACILITY:  [facility]   PHYSICIAN:  [name], M.D.DATE OF BIRTH:  [birth year]   DATE OF PROCEDURE:  06/02/2005  DATE OF DISCHARGE:                                 OPERATIVE REPORT   PREOPERATIVE DIAGNOSIS:  Clotted left forearm AV graft.   POSTOPERATIVE DIAGNOSIS:  Clotted left forearm AV graft.   PROCEDURE:  Thrombectomy of left forearm AV graft with insertion of new  segment of graft to the more proximal basilic vein and intraoperative  fistulogram.   SURGEON:  Edilia Bo.   ASSISTANT:  Coral Ceo, PA   ANESTHESIA:  Local with sedation.   TECHNIQUE:  The patient was taken to the operating sedated by anesthesia.  The left upper extremity was prepped and draped in usual sterile fashion.  After the skin was infiltrated with 1% lidocaine, the incision over the  venous anastomosis was opened.  The venous limb of the graft dissected free.  This was an end-to-side anastomosis. I ligated the vein distally and  dissected up the more proximal basilic vein by extending the incision. The  graft was then divided. The patient was heparinized. Graft thrombectomy was  achieved using a #4 Fogarty catheter. There were no problems in pulling the  catheter through the body the graft. Of note, I could not get the Fogarty  catheter through the arterial anastomosis. Despite multiple attempts with  both the 3 and 4 Fogarty catheter. I therefore had to explore the arterial  end of the of the graft. The previous incision at the antecubital level was  opened after the skin anesthetized and the arterial limb of the graft  dissected free. The brachial artery was dissected free proximal and distal  to the anastomosis. The graft was then divided transversely and the plug  directly retrieved and arterial anastomosis was inspected and was  widely  patent. The graftotomy was then closed with running 6-0 Prolene suture. The  new segment of graft was brought to the field, spatulated and sewn end-to-  end to the more proximal spatulated basilic vein using continuous 6-0  Prolene suture. The new segment of graft was then cut to appropriate length  and anastomosed end-to-end to the old graft using continuous 6-0 Prolene  suture. At completion there was an excellent thrill in the graft.  Intraoperative fistulogram was obtained which showed no problems with the  graft or the venous runoff. Hemostasis was obtained in the wounds. The  wounds were closed with a deep layer of 3-0 Vicryl. The skin closed with 4-0  Vicryl. Sterile dressing was applied. The patient tolerated procedure well  and was transferred to recovery room in satisfactory condition. All needle  and sponge counts were correct.      [name], M.D.  Electronically Signed    CSD/MEDQ  D:  06/02/2005  T:  06/02/2005  Job:  [id number]

## 2011-01-16 NOTE — Op Note (Signed)
NAME:  [name], [name]           ACCOUNT NO.:  [id number]   MEDICAL RECORD NO.:  [id number]          PATIENT TYPE:  AMB   LOCATION:  SDS                          FACILITY:  [facility]   PHYSICIAN:  Larina Earthly, M.D.    DATE OF BIRTH:  [birth year]   DATE OF PROCEDURE:  10/15/2005  DATE OF DISCHARGE:                                 OPERATIVE REPORT   PREOPERATIVE DIAGNOSIS:  End-stage renal disease.   POSTOP DIAGNOSIS:  End-stage renal disease.   PROCEDURE:  1.  Placed right IJ Diatek catheter with ultrasound visualization.  2.  Right upper arm AV fistula creation.   SURGEON:  Larina Earthly, M.D.   ASSISTANT:  Coral Ceo, P.A.   ANESTHESIA:  LMA.   COMPLICATIONS:  None.   DISPOSITION:  Discharged to recovery room stable.   PROCEDURE IN DETAIL:  The patient was taken to the operating room and placed  in the supine position where the area of the right and left neck were  ultrasound visualized. The patient had patent jugular veins bilaterally. The  patient was placed into Trendelenburg position; and using a finder needle  the right internal jugular vein was easily identified.   Next, using the Seldinger technique, the guidewire was passed down to the  level of the right atrium. The dilator and peel-away sheath was passed over  the guidewire; and the dilator and the guidewire were removed. A 32-cm  Diatek catheter was positioned down through the peel-away sheath which was  then removed as well. The catheter was brought through a subcutaneous tunnel  and a two-lumen port was attached. The catheter tip was positioned at the  level of the distal superior vena cava and the right atrial junction. The  catheter was secured to the skin with a 3-0 Nylon stitch; and entry site  closed with 4-0 subcuticular Vicryl stitch. Sterile dressing was applied.  The catheter was locked with 1000 units/mL heparin.   Next, the right arm was reprepped and draped and an incision was made over  the  antecubital space, carried down to isolate the cephalic vein which was  of good caliber; and the brachial artery which was also of good caliber. The  cephalic vein was mobilized proximally and distally; and was ligated  distally, and divided and mobilized to the level of the brachial artery. The  artery was occluded proximally and distally.  It was entered with an 11-  blade and sewn down with Pott's second scissors. The vein was cut to the  appropriate length.  It was spatulated and sewn end-to-side  to the artery with a running 6-0 Prolene suture. Clamps were removed and an  excellent thrill was noted. Wound was irrigated with saline. Hemostasis with  electrocautery.  The wounds were closed with 3-0 Vicryl in the subcutaneous  and subcuticular tissue.  Benzoin and Steri-Strips were applied.      Larina Earthly, M.D.  Electronically Signed     TFE/MEDQ  D:  10/15/2005  T:  10/15/2005  Job:  [id number]

## 2011-01-16 NOTE — Discharge Summary (Signed)
[name], [name]            ACCOUNT NO.:  [id number]   MEDICAL RECORD NO.:  [id number]          PATIENT TYPE:  INP   LOCATION:  5503                         FACILITY:  [facility]   PHYSICIAN:  Maree Krabbe, M.D.DATE OF BIRTH:  04-01-1966   DATE OF ADMISSION:  11/05/2006  DATE OF DISCHARGE:  11/11/2006                               DISCHARGE SUMMARY   DISCHARGE DIAGNOSES:  1. Hyperkalemia.  2. Malfunctioning arteriovenous fistula.  3. Volume overload.  4. Type 2 diabetes mellitus.  5. Obesity.  6. End-stage renal disease.  7. Anemia.  8. Constipation.  9. Peripheral vascular disease.  10.Deconditioning.   CONSULTS:  Dr. Arbie Cookey.   PROCEDURES:  1. Right upper extremity fistulogram November 06, 2006 showed long segment      cephalic vein occlusion beyond the initial open dilated segment in      the more central aspect of the cephalic vein chronically occluded      with collateral veins present.  The vein was treated with balloon      angioplasty, but this was unsuccessful in reestablishing venous      outflow due to more central chronic occlusion, Dr. Fredia Sorrow.  2. Hemodialysis.  3. Transfusion 2 units of packed red cells November 08, 2006.  4. November 07, 2006:  Thrombectomy revision of right upper AV fistula      with Gore-Tex, Dr. Arbie Cookey.  5. Placement of right IJ tunneled catheter and right upper AV Gore-Tex      graft, Dr. Darrick Penna.   HISTORY OF PRESENT ILLNESS:  [name] is a 45 year old white  female with end-stage renal disease who dialyzes Monday, Wednesday,  Friday at home.  She had her last good hemodialysis Monday.  Became sick  on Wednesday, and on Thursday her fistula would not cannulate.  Friday  it was cannulated by the nurse at home training, but then the machine  malfunctioned at home.  In the emergency room the night prior, her  potassium was 6, weight up 4.5 kg.  She is admitted for treatment of  hyperkalemia.   HOSPITAL COURSE:  Problem #1.   Hyperkalemia.  The patient had a  fistulogram done which was described as above.  She had a thrombectomy  revision with Gore-Tex jump from her cephalic vein fistula to her  basilic vein on March9.  Unfortunately, it was clotted the following day  when she was taken for placement of a new tunneled dialysis catheter and  upper arm graft.  Potassium had been corrected, and the remainder of her  hospitalization was due to other issues.  Problem #2.  During hospital course malfunctioning right upper AV  fistula, as mentioned in #1.  Her fistula was converted to a graft and  then subsequently clotted.  She was then made n.p.o. and taken to  surgery the following day for placement of a new dialysis catheter and  graft.  She was quite sore with mobility issues following this  procedure.  Her right hand was slightly cooler than her left, but she  had adequate perfusion.  The patient has baseline neuropathy and  numbness; therefore, this did  not appear to be steal related.  She does  have right upper extremity edema.  May be aggravated by having a  catheter and graft in the same arm.  She is advised to keep her arm  elevated.  Problem #3.  Volume overload.  The patient had several dialysis  treatments during the hospitalization which corrected this problem.  Blood pressure was somewhat of a limiting factor in removing volume.  She is not on any blood pressure medications.  This may have been  somewhat aggravated by pain medications and will be titrated  appropriately in the outpatient setting,  Problem #4.  Type 2 diabetes mellitus.  For the most part she had good  blood sugar control during her hospitalization and will be discharged on  meds as per discharge list.  Problem #5.  End-stage renal disease.  The patient had been dialyzing at  home.  With her fistula, however, because of the catheter and her  mobility issues at this time it seems prudent for her to dialyze back at  her previous  dialysis center where dialysis orders were faxed and where  she will dialyze until all access issues are resolved.  Problem #6.  Anemia.  Hemoglobin on admission was 11 following several  vascular procedures.  This dropped down to 7.8.  She was transfused 2  units of packed red cells.  This too may have contributed to low blood  pressure.  Her hemoglobin at discharge was 9.2.  Her plan for anemia  management was on the discharge orders faxed to her dialysis center.  Problem #7.  Constipation.  The patient reported on March12 that she did  not have a bowel movement for at least several days prior.  She had been  highly immobilized.  She was given some supplemental laxatives with some  response.  This will improve with her increased mobilization after  discharge.  Problem #9.  Peripheral neuropathy.  The patient continues on Lyrica, no  change.  Problem #10.  Deconditioning.  This contributed to her somewhat longer  than usual stay.  She will go home with outpatient physical and  occupational therapy for as long as is needed.   CONDITION AT DISCHARGE:  Stable.   DISCHARGE MEDICATIONS:  1. Tylox 1-2 tablets as needed every four hours as needed for pain.      Prescription was given for  #20.  1. Lyrica 50 mg b.i.d.  2. PhosLo 667 mg four with meals three times a day.  3. Sensipar 50 mg every third day.  4. Nephro-Vite one daily.  5. 75/25 insulin 8 units subcu b.i.d.   DISCHARGE DIET:  Renal 100 g protein, 2 g sodium, 2 g potassium  carbohydrate modified diet.  Limit fluids to 1200 cc per day.   ACTIVITY:  Activity level as before.   She is to receive outpatient physical therapy, wound care.  She is  advised not to take showers as long as she has dialysis catheter.  Discharge dialysis orders for next dialysis treatment will be Saturday  11:15 a.m.   DISCHARGE DIALYSIS ORDERS:  She will dialyze for five hours.  Estimated dry weight is 118 kg, blood flow rate 400, dialysate flow  rate 800, 2K  potassium bath, 2.5 calcium bath, Opti-Flex 200.  She is to be on tight  heparin x1 week, then standard.  Epogen dose is 10,000 units IV every  dialysis.  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   Forty minutes were  spent completing discharge information and discharge  dialysis prescription, talking to patient, communicating with dialysis  center, and dictating her Discharge Summary.      Weston Settle, P.A.      Maree Krabbe, M.D.  Electronically Signed    MB/MEDQ  D:  12/24/2006  T:  12/24/2006  Job:  [id number]   cc:   Maree Krabbe, M.D.  Larina Earthly, M.D.

## 2011-01-16 NOTE — Discharge Summary (Signed)
NAME:  [name], [name] NO.:  [id number]   MEDICAL RECORD NO.:  [id number]                   PATIENT TYPE:  INP   LOCATION:  5015                                 FACILITY:  [facility]   PHYSICIAN:  Lazaro Arms, M.D.        DATE OF BIRTH:  Jun 07, 1966   DATE OF ADMISSION:  02/02/2003  DATE OF DISCHARGE:  02/09/2003                                 DISCHARGE SUMMARY   PRIMARY CARE PHYSICIAN:  Dr. Janey Greaser.   NEPHROLOGIST:  Cecille Aver, M.D.   CARDIOLOGIST:  __________.   DISCHARGE DIAGNOSES:  1. Foot infection status post irrigation and debridement: Improved.  2. 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  3. Hypertension.  4. Left ventricular dilation ejection fraction 40-45%.  5. Coronary artery disease.  6. Cardiomyopathy.  7. Diabetic retinopathy status post laser.  8. Depression.   CONSULTANT:  Dr. Ezzard Standing from surgery for debridement of the diabetic foot.   PROCEDURE:  1. Ankle brachial indexes of the legs which were within normal limits.  2. PICC line which was utilized for __________ during hospitalization.   HISTORY OF PRESENT ILLNESS AND HOSPITAL COURSE:  Problem 1: Ms. [name]  came to the emergency room with worsening blisters on her feet; in the  emergency room she was febrile.  Her foot had an obvious cellulitis with a  questionable abscess.  She also had a hemoglobin of 7.5.  Physical  consultation was obtained and the patient was transfused with packed red  cells.  Her initial labs also revealed a creatinine of 3.1.  This was  somewhat elevated from her previous creatinine, although it had been  trending upwards as she was following up with Dr. Kathrene Bongo.   The patient did well. She was placed on Zosyn antibiotics and wound care saw  her and she had some mild debridement by the general surgeons.  There was no  localized abscess, but she did have a rather extensive  infection.  Local  wound care was applied and the patient continued on IV antibiotics.  The  wounds slowly improved and the surgeons felt that she was safe for home  discharge on February 09, 2003 with local wound care and p.o. antibiotics at  home.   Problem 2:  CHRONIC RENAL INSUFFICIENCY.  The creatinine initially  stabilized at around 3.1, although it was elevated at 3.9 on the day of  discharge.  Dr. Kathrene Bongo had spoken with the patient and made  arrangements for close follow up next week.  In addition the potassium was  always borderline around 4.9-5 and it did get up to 5.4.  She was given  Kayexalate 30 gm x1 and it came down to 4.9.  She was told to take 1 more  dose of Kayexalate as she was to get labs again today prior to discharge.  In addition, the patient was instructed on a low potassium diet by Dr.  Goldsborough.  She will follow that as well.   Problem 3:  DIABETES.  She was placed on Lantus insulin with a sliding scale  which seems to give her a much better control than she was getting on her  previous regimen.  Her previous regimen showed a hemoglobin A1c of 8.3%.  Much diabetic teaching was done on carbohydrate counting and the patient  agreed to outpatient diabetes education.  In addition, the patient indicates  that she is to be followed up with the endocrinologist after discharge as  this had been planned previously.  We decided on sending her home on Lantus  with Humalog insulin with meals as with her changing renal function and  intercurrent infection it would be hard to plan out the exact dosages of  insulin to give with meals and carbohydrate counting __________ the ideal of  4 shots per day.  She will work this out as an outpatient as well as with  her primary care physician, endocrinologist, and diabetes team.   Problem 4: BLOOD PRESSURES.  In terms of her heart cardiac system and blood  pressure and pulse remained stable through hospitalization and she  remained  without any cardiac or respiratory complaint.   The patient was discharged home in stable condition on February 09, 2003.   DISCHARGE PLAN:  1. She is to follow up with diabetic outpatient center and was given the     number.  2. She is to get labs next week at Dr. Jon Gills office.  She will     follow up on June 30 with Dr. Kathrene Bongo.  3. She is to follow up with Dr. Allene Pyo office in 7-10 days and she will     call to make an appointment.  4. She was instructed in proper wound care to her foot and was given a     prescription for that.   DISCHARGE MEDICATIONS:  1. Augmentin 875 mg p.o. b.i.d.  2. Lantus 38 units subcu q.h.s.  3. Humalog insulin with meals per sliding scale for now.  This will be     adjusted, again, as an outpatient.  4. FeSo4 325 mg p.o. t.i.d.                                               Lazaro Arms, M.D.    AMC/MEDQ  D:  02/09/2003  T:  02/09/2003  Job:  [id number]   cc:   Cecille Aver, M.D.  [address]  [place]  [place] [address]  Fax: [phone]   Dr. Janey Greaser

## 2011-01-16 NOTE — Discharge Summary (Signed)
NAME:  [name], [name]           ACCOUNT NO.:  [id number]   MEDICAL RECORD NO.:  [id number]          PATIENT TYPE:  INP   LOCATION:  5530                         FACILITY:  [facility]   PHYSICIAN:  Dwana Curd. Para March, M.D. DATE OF BIRTH:  16-Jun-1966   DATE OF ADMISSION:  12/01/2005  DATE OF DISCHARGE:  12/04/2005                                 DISCHARGE SUMMARY   DISCHARGE DIAGNOSES:  1.  Shortness of breath, chest pain.  2.  End-stage renal disease.  3.  Diabetes mellitus.  4.  Congestive heart failure.  5.  Hyperparathyroidism.   DISCHARGE MEDICATIONS:  1.  Lyrica 50 mg p.o. b.i.d.  2.  Humulin 75/25, 80 units b.i.d.  3.  PhosLo 667 mg four with meals, two with snacks.  4.  Sensipar 60 mg p.o. every day.   FOLLOWUP:  The patient is to follow up at [facility] on  Saturday for regular dialysis.   PROCEDURES/DIAGNOSTIC STUDIES:  1.  Cardiolite stress test showing defective apex in the apical segment with      an ejection fraction of 57%.  2.  Cardiac catheterization showing normal coronary anatomy.   CONSULTANTS:  [facility].   ADMISSION HISTORY AND PHYSICAL:  The patient is a 45 year old, morbidly  obese, diabetic female with end-stage renal disease who normally has  dialysis Monday, Wednesday, Friday, Saturday in [place], who had acute  onset of shortness of breath and chest pressure with activity.  She was  admitted to rule out MI and for dialysis.   HOSPITAL COURSE:  1.  Shortness of breath/chest pain. The patient was admitted and she had a      stress test showing a defective apex in the apical segment.  She was      subsequently taken to cardiac catheterization which showed normal      anatomy.  There were no target lesions that could be stented.  The      patient tolerated both the procedures well.  In terms of her chest      pain/chest tightness, this improved during the hospitalization without      further cardiac intervention.  Her  shortness of breath improved with      dialysis.  See problem #2 below.  2.  End-stage renal disease.  The patient has a history of end-stage renal      disease normally getting dialysis Monday, Wednesday, Friday, Saturday      because of a tendency to accumulate fluid.  It is likely that the      patient was slightly fluid overloaded when she came into the hospital      and this was contributing to her shortness of breath.  She did tolerate      dialysis well when she was an inpatient.  At the time of dictation, she      is completing her last inpatient dialysis treatment and will likely be      discharged at the end of this with followup tomorrow.  3.  Diabetes.  The patient has a history of diabetes and she is continued on  Humalog 75/25, 80 units b.i.d. with good glycemic control while she was      in the hospital.  4.  Congestive heart failure.  The patient has a history of congestive heart      failure.  It is very likely that she was fluid overloaded when she came      in and this was improved with dialysis with net fluid loss.  5.  Hyperparathyroidism.  The patient has a history of hyperparathyroidism.      She is to continue on PhosLo and Sensipar as per outpatient routine.   The patient's estimated dry weight is 116 kg.  She will follow up at  [facility].  There is no change in her hemodialysis orders.      Dwana Curd Para March, M.D.     GSD/MEDQ  D:  12/04/2005  T:  12/05/2005  Job:  [id number]   cc:   [facility]   [facility]

## 2011-01-16 NOTE — Discharge Summary (Signed)
NAME:  [name], [name] NO.:  [id number]   MEDICAL RECORD NO.:  [id number]          PATIENT TYPE:  INP   LOCATION:  5522                         FACILITY:  [facility]   PHYSICIAN:  Richardean Sale, M.D.   DATE OF BIRTH:  [birth year]   DATE OF ADMISSION:  10/18/2004  DATE OF DISCHARGE:  10/24/2004                                 DISCHARGE SUMMARY   ADMISSION DIAGNOSES:  1.  Vulvar abscess.  2.  Insulin-dependent diabetes.  3.  End-stage renal disease.   DISCHARGE DIAGNOSES:  1.  Vulvar abscess.  2.  Insulin-dependent diabetes.  3.  End-stage renal disease.   PROCEDURE:  Incision and drainage of vulvar abscess performed at [facility] on October 18, 2004.   CONSULTATIONS:  1.  Dr. Maree Krabbe with the renal service.  2.  Dr. Cherylynn Ridges with general surgery.  3.  Dr. Tinnie Gens C. Hatcher with infectious disease.   HISTORY OF PRESENT ILLNESS:  This is a 45 year old gravida 1, para 0, 0, 1,  0 white female with insulin-dependent diabetes that is poorly controlled and  end-stage renal disease secondary to diabetes, who underwent an incision and  drainage of a vulvar abscess on October 18, 2004.  The surgery was  originally performed at [facility], but given her significant  comorbidities and the need for intravenous antibiotics, the patient was  transferred to [facility] where her care could be  supervised by the renal service.   HOSPITAL COURSE:  The surgical findings included a well-defined abscess in  the left vulva that was superficial.  General surgery was consulted to rule  out any spread of infection or possibility of necrotizing fasciitis and for  the recommendations regarding postoperative wound care.  Infectious disease  was consulted to establish an appropriate antibiotic regimen, as the patient  has a known history of methicillin-resistant Staphylococcus aureus.  The  patient's surgery went well without  complications.  Her wound was packed  with sterile gauze in the operating room.  Wound cultures were obtained at  the time of the procedure which eventually grew out both methicillin-  resistant Staphylococcus aureus and a vancomycin-resistant Enterococcus.  The patient had two episodes of fever during her hospitalization, the first  was on postoperative day number two, and again on postoperative day number  four.  Blood cultures were obtained which were negative.  The patient  underwent a chest x-ray which was stable from her previous examination.  The  patient underwent a consultation with general surgery to rule out  necrotizing infection.  Per their recommendations, no further surgical  debridement was needed.  The wound care team was consulted for care of her  wound.  She underwent dressing changes three times daily with bedside  irrigation of the wound, and tolerated these very well.  The renal service  managed her diabetes and other comorbidities, and the patient underwent  dialysis on Monday and Wednesday during her regular scheduled time.  Infectious disease started the patient on vancomycin IV, and then switched  over to oral Zyvox for a total of seven days, to be  completed as an  outpatient orally.  The patient's wound continued to improve on a daily  basis, with significantly less erythema and induration.  She tolerated the  dressing changes without difficulty, and on postoperative day number six,  was discharged to home in an improved condition.   DISPOSITION:  To home.   CONDITION ON DISCHARGE:  Improved.   WOUND CARE:  Home health has been arranged for t.i.d. dressing changes, with  bedside irrigation in between dressing changes.   DISCHARGE MEDICATIONS:  1.  Continue on her insulin regimen.  2.  Her Neurontin was discontinued, and she was started on Lyrica for her      diabetic neuropathy.  3.  She was continued on Zyvox 600 mg p.o. b.i.d. for an additional five       days, per the recommendations from infectious disease.   FOLLOWUP:  The patient will follow up with me within one week from  discharge, to re-evaluate her wound.  She will continue with her dialysis on  Monday, Wednesday and Friday as arranged by the renal service.   LABORATORY DATA:  Wound cultures positive for methicillin-resistant  Staphylococcus aureus and vancomycin-resistant Enterococcus.  Blood cultures  negative.  C. difficile toxin negative.  Hemoglobin A1c 11.8.  Last  hemoglobin 10.0, hematocrit 30.4, platelet count 177, white blood cell count  5.6.  Last creatinine 4.9, BUN 42.      JW/MEDQ  D:  10/27/2004  T:  10/27/2004  Job:  [id number]

## 2011-01-16 NOTE — Op Note (Signed)
[name], [name]            ACCOUNT NO.:  [id number]   MEDICAL RECORD NO.:  [id number]          PATIENT TYPE:  INP   LOCATION:  5503                         FACILITY:  [facility]   PHYSICIAN:  Larina Earthly, M.D.    DATE OF BIRTH:  May 08, 1966   DATE OF PROCEDURE:  11/07/2006  DATE OF DISCHARGE:                               OPERATIVE REPORT   PREOPERATIVE DIAGNOSIS:  End-stage renal disease with occluded right  upper arm AV fistula.   POSTOPERATIVE DIAGNOSIS:  End-stage renal disease with occluded right  upper arm AV fistula.   PROCEDURE:  Thrombectomy and revision with Gore-Tex jump from her  cephalic vein upper arm fistula to basilic vein.   SURGEON:  Larina Earthly, M.D.   ASSISTANT:  Nurse.   ANESTHESIA:  General endotracheal.   COMPLICATIONS:  None.   DISPOSITION:  To recovery room stable.   PROCEDURE IN DETAIL:  The patient was taken to the room and positioned;  where the area of right arm prepped and draped in usual sterile fashion.  Using ultrasound, the basilic vein was identified and was of good  caliber.  An incision was made over the basilic vein, carried down to  isolate this with a vessel loop.  Next, the cephalic vein fistula was  located at the mid upper arm.  The patient was being accessed only near  the antecubital space with a buttonhole technique.  The vein was  encircled at this level and was sclerotic distally.  The vein was opened  transversely and the catheter would not pass centrally.  Fogarty  catheter could pass down to the arterial anastomosis.  Clot was removed  and good flow was noted.  A 5 dilator passed through this.  The vein was  flushed with heparinized saline and reoccluded.  The interposition graft  7 mm Gore-Tex was brought onto the field.  The basilic vein was occluded  proximal and distally, and was opened with an #11 blade; sewn  longitudinally with Potts scissors.  The graft was spatulated and sewn  end-to-side to the basilic vein  with a running 6-0 Prolene suture.  Clamps were removed and the graft was flushed with heparinized saline  and reoccluded.   Next, the graft was tunneled to the level of cephalic vein.  Cephalic  vein was spatulated; the graft cut to appropriate length, and was sewn  end-to-end to the cephalic vein with running 6-0 Prolene suture.  Clamps  were removed and good thrill was noted.  The wounds were irrigated with  saline.  Hemostasis with electrocautery.  The wounds were closed with 3-  0 Vicryl in the subcutaneous and subcuticular tissue.  Benzoin and Steri-  Strips were applied.      Larina Earthly, M.D.  Electronically Signed     TFE/MEDQ  D:  11/07/2006  T:  11/07/2006  Job:  [id number]

## 2011-01-16 NOTE — Cardiovascular Report (Signed)
NAME:  [name], [name] NO.:  [id number]   MEDICAL RECORD NO.:  [id number]          PATIENT TYPE:  INP   LOCATION:  5530                         FACILITY:  [facility]   PHYSICIAN:  Vesta Mixer, M.D. DATE OF BIRTH:  01-10-1966   DATE OF PROCEDURE:  12/04/2005  DATE OF DISCHARGE:  12/04/2005                              CARDIAC CATHETERIZATION   [name] is a 45 year old female with a history of diabetes  mellitus, end stage renal disease, congestive heart failure, depression, and  anemia.  She was admitted to the hospital on the December 01, 2005, with chest  pains.  She had an abnormal stress Cardiolite study and is now referred for  heart catheterization.   PROCEDURE:  Left heart catheterization with coronary angiography.   The right femoral artery was easily cannulated using a Smart needle.   HEMODYNAMIC RESULTS:  LV pressure was 104/50 with an aortic pressure of  103/54.   ANGIOGRAPHY:  Left main:  The left main is smooth and normal.   The left anterior descending artery is smooth and normal.  It wrapped around  the apex and supplies a good bit of the inferoapical wall.   The diagonal artery was normal.   The left circumflex artery gives off a large first obtuse marginal artery.  It is smooth and normal.   The right coronary artery is small to moderate in size.  It is dominant.  The posterior descending artery and a very small posterolateral artery is  normal.   The left ventriculogram was performed in the 30 RAO position.  It reveals  overall normal left ventricular systolic function.  There are no segmental  wall motion abnormalities.   COMPLICATIONS:  None.   CONCLUSION:  1.  Smooth and normal coronary arteries.  2.  Normal left ventricular systolic function.   I suspect that her chest pain is noncardiac in origin.  She should be able  to be discharged today if it is okay with the renal service.            ______________________________  Vesta Mixer, M.D.     PJN/MEDQ  D:  12/04/2005  T:  12/04/2005  Job:  [id number]   cc:   Armanda Magic, M.D.  Fax: [phone]   [facility] Eliott Nine, M.D.  Fax: [phone]

## 2011-01-16 NOTE — Discharge Summary (Signed)
NAME:  [name], [name]           ACCOUNT NO.:  [id number]   MEDICAL RECORD NO.:  [id number]          PATIENT TYPE:  INP   LOCATION:  5525                         FACILITY:  [facility]   PHYSICIAN:  Terrial Rhodes, M.D.DATE OF BIRTH:  11/03/1965   DATE OF ADMISSION:  01/26/2005  DATE OF DISCHARGE:  02/02/2005                                 DISCHARGE SUMMARY   ADMITTING DIAGNOSES:  1.  Weakness.  2.  Type 2 insulin-dependent diabetes mellitus.  3.  Congestive heart failure.  4.  Anemia.  5.  Secondary hyperparathyroidism.  6.  End-stage renal disease.  7.  History of hypertension.  8.  History of depression.  9.  History of severe peripheral neuropathy, retinopathy, and gastroparesis.  10. 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  11. History of vulvar abscess.  12. Morbid obesity.   DISCHARGE DIAGNOSES:  1.  Weakness felt secondary to hypotension due to large fluid gains.  2.  Abdominal pain, resolving.  3.  Congestive heart failure, resolved.  4.  Fevers, resolved.   CONSULTS:  Dr. Porfirio Mylar Dohmeier Jan 27, 2005 recommended a lumbar and  thoracic MRI which revealed L5-S1 degeneration of disk and circumferential  protrusion, mild neural foraminal encroachment on the right without definite  compression of the exiting L5 nerve root, small right posterolateral disk  protrusion at T1-2 without any apparent effect upon the neural structures.  No evidence of cord infarct.  She also recommended outpatient nerve  conduction studies.  She also recommended checking a B12 and a CK level  which were within normal limits.  She did not feel that an EEG or brain scan  was needed at the time of her consultation.   PROCEDURES:  Hemodialysis.   BRIEF HISTORY:  [name] is a 45 year old white obese woman who has a history  particularly significant for huge fluid gains as well as poorly controlled  insulin-requiring diabetes mellitus.  During hemodialysis on Jan 27, 2005  her blood pressure got as low as 68/20.  The treatment was then complicated  by nausea and vomiting following the treatment.  6 kg total was removed.  However, due to the inability to move her arms or legs and generalized  weakness she did not think her treatment should be continued.  She requested  transfer to [facility].  She was subsequently admitted to rule out  a spinal cord infarct.   HOSPITAL COURSE:  #1 - WEAKNESS:  MRI ruled out infarction, however, did  show some mild pathology at L5-S1 level.  Dr. Vickey Huger recommended  outpatient EMG studies.  Dr. Vickey Huger saw her on Jan 27, 2005 and did not  appreciate any clear sensory deficit.  She did want her to be evaluated by  PT and OT.  She felt that the weakness was secondary to a hypotensive  episode during the dialysis treatment.  She did not feel that an EEG or  brain scan was appropriate at the time of her consultation.  Ms. [name]  continued to feel better.  However, she spiked a temperature.  Blood  cultures were obtained and she was  empirically started on vancomycin and  Fortaz.  However, so far blood cultures have been negative to date.  They  are not finalized.  Urine was also checked and the culture is final and  negative to date.   #2 - INSULIN-DEPENDENT DIABETES MELLITUS TYPE 2:  She states that there was  question of her diabetes being type 1 versus type 2 and she has been told  recently that she is definitely type 2 after some blood work that was  obtained, I believe by her endocrinologist.  Dr. Darrick Penna had suggested  changing her insulin to 70/30; however, because she had just filled her  Lantus prescription she wanted to use that up first.  However, during this  hospitalization she has been on 75/25 insulin and has been pretty stable  throughout the hospitalization.  Average glucose in the a.m. the last couple  of days has been about 145.  Average glucose in the evening last few days  has been  140.   #3 - HISTORY OF HYPERTENSION:  She does not take any antihypertensives as an  outpatient.  She has been on none during this hospitalization.  Her blood  pressure has been pretty well controlled, average systolic blood pressure in  the 130s the last several days of this admission and, as stated previously,  the hypotensive episodes are most when she gains a lot of fluid in between  treatments.  We were unable to get her below 108 due to blood pressure so I  believe that she really has gained mass.  Homocysteine level checked and  this was 11.24.   #4 - ANEMIA:  She usually receives 11,000 units of Epogen q.hemodialysis and  no iron.  During this hospitalization she was getting only 1000 units of  Epogen q.treatment.  Her hemoglobin upon presentation was 13.1.  Last one  checked on January 30, 2005 was 10.7.  We will resume her Epogen 11,000 units IV  q.treatment as an outpatient.  No evidence of bleeding.   #5 - SECONDARY HYPERPARATHYROIDISM:  She is not on vitamin D as an  outpatient.  However, she says she takes Phos-Lo three with meals and that  is what she has been receiving during this hospitalization.  Her  phosphorous, calcium, and albumin on presentation phosphorous was 6, calcium  9.3, albumin 3.5 and the last time it was checked on June 5 phosphorous 6.2,  calcium 8.9, albumin 2.6.  I will increase her Phos-Lo to four with meals.  She has been receiving Calcijex 0.5 mcg IV q.treatment and I am not really  sure why due to the fact that she is receiving no vitamin D therapy as an  outpatient.   #6 - PERIPHERAL NEUROPATHY:  Due to her hypotension her Lyrica 50 mg b.i.d.  was cut down to 25 mg p.o. b.i.d.  I will find a happy medium with Lyrica 75  p.o. daily as an outpatient and will continue to follow her blood pressures  at the dialysis unit on [facility] but I think she can tolerate 75 daily.   #7 - ABDOMINAL PAIN:  The CT scan with and without oral contrast as well  as IV contrast revealed no acute abnormalities in the abdomen, borderline  splenomegaly unchanged, atrophy of the pancreatic body and tail unchanged,  shotty retroperitoneal lymphadenopathy unchanged.  CT of the pelvis revealed  no acute abnormalities in the pelvis, improvement in the large left ovarian  cyst since the prior examination; however, a residual 2.5  cm left ovarian  cyst remains, intrauterine device, mildly enlarged bilateral external iliac  lymph nodes with predominantly fatty replacement unchanged.   DISPOSITION:   MEDICATIONS:  1.  75/25 65 units b.i.d.  2.  Nephro-Vite one tablet p.o. daily.  3.  Phos-Lo 667 mg four with meals.  4.  Lyrica 75 mg once a day.  5.  Tylenol 650 mg p.o. q.4h. p.r.n. pain.   DIET:  Renal, carbohydrate modified, 1200 mL fluid restriction.   SPECIAL INSTRUCTIONS:  Watch fluid intake.  Charge nurse at the dialysis  unit will follow up on blood cultures drawn on January 29, 2005 due to a fever.  We will continue her Epogen 11,000 units IV q.treatment and estimated dry  weight will be 110 kg.      MJG/MEDQ  D:  02/02/2005  T:  02/02/2005  Job:  [id number]   cc:   [facility] [address]   [facility]

## 2011-01-16 NOTE — Consult Note (Signed)
NAME:  [name], [name] NO.:  [id number]   MEDICAL RECORD NO.:  [id number]                   PATIENT TYPE:  INP   LOCATION:  3304                                 FACILITY:  [facility]   PHYSICIAN:  Garnetta Buddy, M.D.                DATE OF BIRTH:  1966/07/26   DATE OF CONSULTATION:  02/24/2003  DATE OF DISCHARGE:                                   CONSULTATION   HISTORY:  This is a 45 year old Caucasian lady with a history of type I  juvenile onset diabetes x20 years.  She also has a history of retinopathy,  peripheral neuropathy and a cardiomyopathy as well as hypertension.  She  states her baseline serum creatinine measured approximately 12 months ago  was around 2 mg/dl.  She was admitted February 03, 2003 with cellulitis of her  lower extremity and was found to have a serum creatinine of 3.0.  This  progressively increased during her hospitalization and she was discharged  with elevated serum creatinine above baseline.   She is admitted with a one week history of fever, sweats, chills, purulent  discharge from a right foot ulcer.   PAST MEDICAL HISTORY:  1. Diabetes mellitus type I.  2. Hypertension.  3. Cardiomyopathy.  4. Depression.  5. History of cellulitis of lower extremities approximately one year ago.  6. History of anemia.  7. Depression.  8. Gastroesophageal reflux disease.  9. History of cardiomyopathy.  10.      History of peripheral neuropathy.   ALLERGIES:  No known drug allergies.   SOCIAL HISTORY:  Not married, lives alone, has a boyfriend.  No tobacco, no  alcohol.  She is a Runner, broadcasting/film/video.   FAMILY HISTORY:  Mother is 17 and has hypertension.  Her father is also 78  and has coronary artery disease.   REVIEW OF SYSTEMS:  GENERAL: No myalgias or arthralgias, complains of fever  and chills times one week.  EYES: Diminished vision secondary to  retinopathy, history of retinal surgery approximately two years ago.  EAR/NOSE/THROAT: Denies  any hearing loss, sinusitis, sore throat.  CARDIOVASCULAR: Denies angina, no orthopnea, no paroxysmal nocturnal  dyspnea, no palpitations.  States has ankle swelling and dyspnea on maximal  exertion.  RESPIRATORY: Denies cough, wheeze or hemoptysis.  Denies any  abnormality on chest x-ray.  UROGENITAL:  Frequency, no urgency, no dysuria,  some nocturia two to three times per night.  ENDOCRINE: History of diabetes  type I.  History of obesity, no history of thyroid disease, no history of  adrenal disease.  HEMATOLOGICAL: No history of deep venous thrombosis, no  history of pulmonary embolus, no history of cancer.  RHEUMATOLOGIC: Denies  any joint pain or arthritis, no history of nonsteroidals antiinflammatory  use.  DERMATOLOGIC: Denies any history of skin rashes, does complain of acne  since childhood.   MEDICATIONS:  1. Aspirin 81 mg daily.  2. Lexapro 20 mg  daily.  3. Lovenox SQ.  4. Insulin sliding scale.  5. Lantus 20 units q.h.s.  6. INS 100 mcg q. week.  7. Protonix 40 mg daily.  8. Vancomycin 1.5 grams q.48h.  9. Lopressor 25 mg b.i.d.  10.      Lasix 40 mg daily.  11.      Zosyn 3.375 mg q.8h.   PHYSICAL EXAMINATION:  GENERAL: Comfortable, in no distress.  VITAL SIGNS: Blood pressure 130/50, pulse is 65, temperature 97.9.  HEENT: Normocephalic, atraumatic.  Pupils round, equal, reactive.  Funduscopic evaluation revealed retinopathy.  Has mild pallor. Ears, nose,  throat - external appearance normal.  Tympanic membranes clear.  Nasal  mucosa clear.  Oropharynx is clear.  NECK:  Supple, no JVD, no thyromegaly, no adenopathy.  CARDIOVASCULAR: Regular rate and rhythm, no murmurs, rubs, or gallops.  No  heaves, no thrills.  RESPIRATORY: Lungs are clear to auscultation and percussion bilaterally.  ABDOMEN: Obese, soft, nontender, nondistended.  EXTREMITIES: Mild 1+ peripheral edema.  NEUROLOGIC: Decreased sensation bilateral lower extremities.   MOST RECENT LABS:  Sodium  139, potassium 3.9, chloride 113, CO2 17, BUN 60,  creatinine 4.1, glucose 91, calcium 7.5.  WBC 8.0 thousand, hemoglobin 8.7, platelets 225,000.  Blood cultures February 24, 2003: moderate staph aureus, blood cultures February 22, 2003: staph aureus.   ASSESSMENT AND PLAN:  Acute on chronic renal failure, etiology acute  interstitial nephritis versus acute glomerulonephritis versus acute tubular  necrosis.  Will check urine eosinophils, check urine sodium, urine  creatinine, check urinalysis, check urine microscopy, check serum  complement, C3, C4, CH-50.  Rule out obstruction with renal ultrasound.  1. Anemia.  Continue INS 100 mcg q.week.  Check iron stores, TIBC, percent     and saturations.  2. Bones.  Check PTH, follow calcium and phosphorus.  3. Hypertension controlled.  4. Mild volume excess, continue Lasix.  5. Lipase, check fasting lipoprotein analysis.  6. Nutrition, continue Niferex one daily.                                               Garnetta Buddy, M.D.    MWW/MEDQ  D:  02/24/2003  T:  02/25/2003  Job:  [id number]

## 2011-01-16 NOTE — Op Note (Signed)
   NAME:  [name], [name]                       ACCOUNT NO.:  [id number]   MEDICAL RECORD NO.:  [id number]                   PATIENT TYPE:  INP   LOCATION:  6715                                 FACILITY:  [facility]   PHYSICIAN:  Larina Earthly, M.D.                 DATE OF BIRTH:  [birth year]   DATE OF PROCEDURE:  DATE OF DISCHARGE:                                 OPERATIVE REPORT   PREOPERATIVE DIAGNOSIS:  Progressive renal insufficiency.   POSTOPERATIVE DIAGNOSIS:  Progressive renal insufficiency.   PROCEDURE:  Creation of left upper arm arteriovenous fistula.   SURGEON:  Larina Earthly, M.D.   ASSISTANT:  Nurse.   ANESTHESIA:  MAC.   COMPLICATIONS:  None.   DISPOSITION:  To recovery room stable.   PROCEDURE IN DETAIL:  The patient was taken to the operating room and placed  in the supine position, where the area of the left arm was prepped and  draped in the usual sterile fashion.  Using local anesthesia, an incision  made over the antecubital space, carried down to isolate the cephalic vein,  which was of large caliber, and the brachial artery, which was also of good  caliber.  The cephalic vein was mobilized proximally and distally and was  ligated distally and divided.  The vein was mobilized to the level of the  brachial artery.  The brachial artery was occluded proximally and distally  and was opened with an 11 blade and extended longitudinally with Potts  scissors.  The vein was cut to the appropriate length, was spatulated and  sewn end-to-side to the artery with a running 6-0 Prolene suture.  Clamps  were removed and an excellent thrill was noted.  The wounds were irrigated  with saline and hemostased with electrocautery.  The wounds were closed with  3-0 Vicryl in the subcutaneous and subcuticular tissue and Benzoin and Steri-  Strips were applied.                                               Larina Earthly, M.D.    TFE/MEDQ  D:  02/28/2003  T:  02/28/2003  Job:   [id number]

## 2011-01-16 NOTE — H&P (Signed)
NAME:  [name], RATHEL                     ACCOUNT NO.:  [id number]   MEDICAL RECORD NO.:  [id number]                   PATIENT TYPE:  AMB   LOCATION:  SDC                                  FACILITY:  [facility]   PHYSICIAN:  Richardean Sale, M.D.                DATE OF BIRTH:  1966/05/21   DATE OF ADMISSION:  DATE OF DISCHARGE:                                HISTORY & PHYSICAL   ANTICIPATED DATE OF PROCEDURE:  February 28, 2004   CHIEF COMPLAINT:  Menometrorrhagia.   HISTORY OF PRESENT ILLNESS:  This is a 45 year old gravida 1, para 0-0-1-0  white female who has been having menses every 14 days.  Menstrual flow lasts  from 17 to 14 days and is significantly heavy.  States that she occasionally  floods her clothes.  She underwent evaluation with ultrasound that revealed  a normally sized uterus with no evidence of any fibroids; however, the  endometrium was markedly thickened at 1.35 cm and there was fluid noted in  the fundus.  There was also a hyperechoic lesion measuring 1.5 x 0.95 cm  which was suggestive of an endometrial polyp.  Both ovaries are visualized  and normal.  There was no cul-de-sac fluid noted.  The patient presents  today for a hysteroscopy D&C for evaluation and treatment of  menometrorrhagia.   PAST MEDICAL HISTORY:  Significant for end-stage renal disease secondary to  diabetes.  The patient is currently on dialysis and is on the transplant  list for kidney and pancreas.  She has been medically cleared to proceed  with surgery by her nephrologist, Dr. Kathrene Bongo.   MEDICATIONS:  Insulin, Tums, intravenous iron during dialysis.   SURGICAL HISTORY:  Partial foot amputation.   ALLERGIES:  None.   SOCIAL HISTORY:  Denies tobacco, alcohol, or drugs.   FAMILY HISTORY:  Significant for diabetes in her mother, maternal  grandparents, and paternal grandparents.  Hypertension in both parents.  No  history of ovarian, uterine, or breast cancer.  Father had benign colon  polyps.   PHYSICAL EXAMINATION:  VITAL SIGNS:  She is afebrile.  Weight is 241 pounds.  Blood pressure is 160/86, pulse 78.  GENERAL:  She is a well-developed white female who appears in no acute  distress who appears older than her stated age.  HEART:  Regular rate and rhythm.  LUNGS:  Clear to auscultation bilaterally.  ABDOMEN:  Soft, nontender, nondistended with no palpable masses.  EXTREMITIES:  Fistula in the left arm for dialysis.  Decreased sensation in  both feet.  BREASTS:  Symmetric, nontender, no palpable masses, no lymphadenopathy, and  no discharge from the nipple.  PELVIC:  External genitalia is within normal limits.  Vagina is well  supported, pink, moist, and rugated with no masses.  Cervix appears normal.  There is no cervical motion tenderness.  Last Pap smear was May of 2005  which was within normal limits.  Bimanual exam:  The uterus is small,  anteverted, nontender with no palpable masses.  Adnexa are nontender with no  palpable masses.  RECTAL:  Confirms the above exam.   ASSESSMENT:  [age]-year-old gravida 1, para 0-0-1-0 with  menometrorrhagia. Abnormal ultrasound suggestive of endometrial polyp and  possible fluid in the endometrium.   PLAN:  We plan to proceed with hysteroscopy D&C for both diagnoses and  treatment if a polyp is identified.  Reviewed with the patient the risks of  the procedure which include hemorrhage requiring transfusion, injury to the  uterus which could require exploratory laparotomy to repair the uterus or  possible hysterectomy and also possible injury to the bowel or the bladder  or other organs that could need to be repaired via laparoscopy or a  laparotomy, anesthetic related complications, infection which would require  additional hospitalization, and a deep vein thrombosis or even death.  The  patient voices understanding of all these risks and agrees to proceed.  We  will plan to proceed with hysteroscopy D&C on February 28, 2004 and we will  pretreat with intravenous antibiotics given her history of diabetes.                                               Richardean Sale, M.D.    JW/MEDQ  D:  02/27/2004  T:  02/27/2004  Job:  [id number]

## 2011-01-16 NOTE — Op Note (Signed)
NAME:  [name], [name]                       ACCOUNT NO.:  [id number]   MEDICAL RECORD NO.:  [id number]                   PATIENT TYPE:  INP   LOCATION:  3304                                 FACILITY:  [facility]   PHYSICIAN:  Lubertha Basque. Jerl Santos, M.D.             DATE OF BIRTH:  07-Jan-1966   DATE OF PROCEDURE:  02/22/2003  DATE OF DISCHARGE:                                 OPERATIVE REPORT   PREOPERATIVE DIAGNOSIS:  Osteomyelitis right foot.   POSTOPERATIVE DIAGNOSIS:  Osteomyelitis right foot.   PROCEDURE:  Partial amputation, right foot.   ANESTHESIA:  General.   SURGEON:  Lubertha Basque. Jerl Santos, M.D.   ASSISTANT:  Lindwood Qua, P.A.   INDICATIONS FOR PROCEDURE:  The patient is a 45 year old woman and a chronic  ulcer on the plantar aspect of her foot. She has developed an ulcer on the  dorsum of her foot over the last week and has presented to the emergency  room. On x-ray she has free air and evidence of osteomyelitis. She has a  foul odor to her foot. Her blood sugars are out of control and she is  febrile. She is offered a partial amputation of the foot. Informed operative  consent was obtained after discussion of the possible complications such as  reaction to anesthesia, continued infection, and the likely hood of more  proximal amputation in the near future.   DESCRIPTION OF PROCEDURE:  The patient was taken to the operating suite  where general anesthetic was applied without difficulty. She was positioned  supine and prepped and draped in the normal sterile fashion. After the  administration of preoperative IV antibiotics a ray amputation was performed  of the 1st and 2nd rays.   I made an elliptical incision between toes 2 and 3 with dissection back in a  fishmouth fashion to the base of the first metatarsal. I used an oscillating  saw to perform osteotomies of the 1st and 2nd metatarsals. The amputated  stump was sent for pathology and cultures were taken and sent  as well. The  wound was irrigated with antibiotic containing solution.   The wound was then loosely closed over a Penrose drain with Prolene. No  tourniquet was inflated during the case. Adaptic was applied to the wound  followed  by dry gauze and a loose Ace wrap.   Estimated blood loss was and interoperative fluids can be obtained from the  anesthesia records. The patient was extubated in the operating room and was  taken to the recovery room in stable condition. Plans were for her to be  admitted back to the medical service for appropriate care including Zosyn as  IV antibiotic.  Lubertha Basque Jerl Santos, M.D.   PGD/MEDQ  D:  02/22/2003  T:  02/24/2003  Job:  [id number]

## 2011-01-16 NOTE — Discharge Summary (Signed)
NAMEKRISS, [name]            ACCOUNT NO.:  [id number]   MEDICAL RECORD NO.:  [id number]          PATIENT TYPE:  INP   LOCATION:  6703                         FACILITY:  [facility]   PHYSICIAN:  Wilber Bihari. Caryn Section, M.D.   DATE OF BIRTH:  Nov 28, 1965   DATE OF ADMISSION:  08/25/2007  DATE OF DISCHARGE:  08/28/2007                               DISCHARGE SUMMARY   ADMITTING DIAGNOSES:  1. Fever, weakness, leukocytosis, rule out sepsis.  2. End-stage renal disease on chronic hemodialysis.  3. Hypotension, acute on chronic.  4. Insulin-dependent diabetes mellitus.   DISCHARGE DIAGNOSES:  1. Methicillin-sensitive Staphylococcus aureus bacteremia.  2. Methicillin-sensitive Staphylococcus aureus dialysis catheter      infection status post removal December 26.  3. Hypotension, acute on chronic, resolved.  4. Insulin-dependent diabetes mellitus.  5. End-stage renal disease on chronic hemodialysis.   BRIEF HISTORY:  A 45 year old white woman with end-stage renal disease  secondary to diabetic nephropathy on chronic hemodialysis every Tuesday,  Thursday, Saturday at the [facility].  She was feeling  well, in her usual state of health, until yesterday.  She awoke on the  day of admission with a temperature of 103.8.  She has noted chills,  body aches, nausea and vomiting.  In the emergency room her blood  pressure is 97/42 and she was admitted to rule out sepsis.   LABORATORIES ON ADMISSION:  Hemoglobin 11.7; white count 19,100;  platelets 170,000.  Sodium 132, potassium 5.7, chloride 104, CO2 28, BUN  69, creatinine 8.8, glucose 202.  Chest x-ray:  No infiltrate, effusion  or CHF.   HOSPITAL COURSE:  Upon admission the patient had blood cultures drawn.  Vancomycin and Elita Quick were dosed intravenously in the emergency room.  She was placed on her usual medications.  Her maximum temperature rose  to 103.1 in the first 24 hours, then defervesced.  Her dialysis catheter  was  removed the following day on December 26.  Thereafter, her white  count dropped to within normal limits.  She had no further fever spikes.  Blood cultures returned growing methicillin-sensitive Staphylococcus  aureus.  Elita Quick was stopped and vancomycin was changed to Ancef at 2 g  IV each dialysis which she will continue to receive as an outpatient  through January 5.   The patient's right upper arm AV Gore-Tex graft was used for two  dialysis treatments while hospitalized without any difficulties.  The  graft of remained patent and otherwise appeared unremarkable.  The  patient's blood pressure rebounded to her normal readings of  approximately 105/60.  She does suffer from chronic hypotension for  which she receives L-carnitine IV each dialysis along with midodrine 10  mg three times a day.  Clinically, she had improved back to baseline and  was discharged on December 20 to return for dialysis every Tuesday,  Thursday, Saturday at the [facility].   DISCHARGE MEDICATIONS:  1. Ancef 1 G IV each dialysis through January 5.  2. Lyrica 50 mg b.i.d.  3. Nephro-Vite vitamin one daily.  4. Midodrine 10 mg t.i.d.  5. Lantus insulin 40 units q.h.s.  6. PhosLo 667 mg one b.i.d.  7. EPO 13,400 units IV each dialysis.  8. Hectorol 3 mcg IV each dialysis.  9. L-carnitine 2 g IV each dialysis.      Zenovia Jordan, P.A.      Richard F. Caryn Section, M.D.  Electronically Signed    RRK/MEDQ  D:  11/12/2007  T:  11/13/2007  Job:  [id number]

## 2011-01-16 NOTE — Op Note (Signed)
NAME:  [name], [name]                       ACCOUNT NO.:  [id number]   MEDICAL RECORD NO.:  [id number]                   PATIENT TYPE:  AMB   LOCATION:  DSC                                  FACILITY:  [facility]   PHYSICIAN:  Lubertha Basque. Jerl Santos, M.D.             DATE OF BIRTH:  08/26/1966   DATE OF PROCEDURE:  03/20/2003  DATE OF DISCHARGE:                                 OPERATIVE REPORT   PREOPERATIVE DIAGNOSIS:  Breakdown right foot amputation site.   POSTOPERATIVE DIAGNOSIS:  Breakdown right foot amputation site.   PROCEDURE:  1. Incision and drainage right foot amputation site.  2. Grafting right foot amputation site.   ANESTHESIA:  General.   SURGEON:  Lubertha Basque. Jerl Santos, M.D.   ASSISTANT:  Lindwood Qua, P.A.   INDICATIONS FOR PROCEDURE:  The patient is a 45 year old woman with a  history of insulin dependent diabetes mellitus.  She is several weeks out  from emergent partial foot amputation for osteomyelitis of her first and  second toes.  Her amputation site has broken down, although does not appear  to be grossly infected.  Our plan at this point is to revise this to a more  healthy stump.  We will try to maintain as much length on her foot as  possible.  Informed operative consent was obtained after discussion of the  possible complications of reaction of anesthesia, continued infection and  the probability of the more proximal amputation in the future.   DESCRIPTION OF PROCEDURE:  The patient was taken to the operating room and  anesthesia was applied.  She was positioned supine and prepped and draped in  the normal sterile fashion.  We used a Betadine scrub and paint technique.  A tourniquet was placed but never inflated.  After administration of  antibiotics, her foot was thoroughly inspected.  I find no evidence of a  gross infection.  She had some significant swelling of the foot that was  much better than at her initial amputation time.  She had some  devitalized  tissue around the wound and this was thoroughly debrided.  Using antibiotic  containing solution for several liters of lavage.  She had no exposed bone  and appeared to have good soft tissue over the bone, none of which I could  see but I could vaguely palpate through the depths of the wound through the  soft tissue.  Along the most distal portion of the wound, I was able to  freshen up some skin edges and reapproximate this without any tension.  To  do the same to the rest of the amputation site would have placed undue  tension on the skin edges and would likely have failed.  As a result, we  decided to apply a graft to this area.  My feeling was that if we closed her  primarily we would sacrifice approximately half of her foot due to her  continued  significant swelling and lack of healthy tissue.  We obtained a  piece of the Restore Patch and soaked this for ten minutes in saline.  I  freshened the granulating bed of tissue in her wound and applied this patch  and trimmed it appropriately.  We then secured it on all edges with 4-0  Vicryl suture.  I applied a piece of Adaptic over this and then a moist  gauze sponge to oppose the graft and the granulation tissue well.  A dry,  gauze dressing was applied followed by a loose Ace wrap.  We also dressed  her heel at the same sitting.  Estimated blood loss and fluids can be  obtained from anesthesia records.    DISPOSITION:  The patient was taken to the recovery room in stable  condition.  The plans were for her to go home the same day and follow up in  the office once a week.                                               Lubertha Basque Jerl Santos, M.D.    PGD/MEDQ  D:  03/20/2003  T:  03/20/2003  Job:  [id number]

## 2011-03-11 ENCOUNTER — Encounter: Payer: Self-pay | Admitting: Family Medicine

## 2011-03-12 ENCOUNTER — Encounter: Payer: Self-pay | Admitting: Family Medicine

## 2011-03-12 ENCOUNTER — Ambulatory Visit (INDEPENDENT_AMBULATORY_CARE_PROVIDER_SITE_OTHER): Payer: Medicare Other | Admitting: Family Medicine

## 2011-03-12 VITALS — BP 130/74 | HR 82 | Temp 97.6°F

## 2011-03-12 DIAGNOSIS — Z0000 Encounter for general adult medical examination without abnormal findings: Secondary | ICD-10-CM | POA: Insufficient documentation

## 2011-03-12 NOTE — Progress Notes (Signed)
45 year old woman with a history of end-stage renal disease, on dialysis 3 times per week, echocardiogram in 2009 showing normal systolic function, cardiac catheterization in 2007 showing no significant coronary artery disease with obstructive sleep apnea, currently not on CPAP, diabetes that is poorly controlled, anemia, depression here to have forms filled out for work.  I have not seen [name] in over a year. She has been following regularly with her nephrologist. Per pt, her a1c this month was 9.0 Now taking 100 units of novolin 70/30 daily. Denies any episodes of hypoglyemia.  She has been driving for years, despite her peripheral neuropathy, ESRD and DM. Never had any issues with driving. Her for DMV forms to be filled out.  Patient Active Problem List  Diagnoses  . DIABETES MELLITUS, TYPE II  . DIABETIC  RETINOPATHY  . DIABETIC PERIPHERAL NEUROPATHY  . MORBID OBESITY  . ADJ DISORDER WITH MIXED ANXIETY & DEPRESSED MOOD  . UNSPECIFIED HYPOTENSION  . PULMONARY NODULE, SOLITARY  . RENAL FAILURE  . SECONDARY HYPERPARATHYROIDISM  . ABSCESS  . DIZZINESS   Past Medical History  Diagnosis Date  . Diabetes mellitus     type 2  . Renal failure     end stage, on dialysis since 05/2002  . Idiopathic parathyroidism     secondary, hyper  . Lung nodule     left lung   Past Surgical History  Procedure Date  . Cataract extraction   . Toe amputation     right foot  . Fistula surgery     multiple graft   History  Substance Use Topics  . Smoking status: Never Smoker   . Smokeless tobacco: Not on file  . Alcohol Use: No   Family History  Problem Relation Age of Onset  . Hypertension Mother   . Hypertension Father    Allergies  Allergen Reactions  . Bupropion Hcl     REACTION: Bites nails  . Morphine     REACTION: ?   Current Outpatient Prescriptions on File Prior to Visit  Medication Sig Dispense Refill  . calcium acetate (PHOSLO) 667 MG capsule Take 4 capsules three  times daily with meals       . cinacalcet (SENSIPAR) 60 MG tablet Take 60 mg by mouth daily.        Marland Kitchen gabapentin (NEURONTIN) 100 MG tablet Take 100 mg by mouth 2 (two) times daily.        . insulin NPH-insulin regular (NOVOLIN 70/30) (70-30) 100 UNIT/ML injection Inject 60 Units into the skin at bedtime.        . midodrine (PROAMATINE) 10 MG tablet Take 10 mg by mouth daily.        . Multiple Vitamin (RENAL MULTIVITAMIN/ZINC) TABS Take 1 tablet by mouth daily.        Marland Kitchen omeprazole (PRILOSEC) 40 MG capsule Take 40 mg by mouth daily.         The PMH, PSH, Social History, Family History, Medications, and allergies have been reviewed in [facility], and have been updated if relevant.   ROS: See HPI   Physical Exam BP 130/74  Pulse 82  Temp(Src) 97.6 F (36.4 C) (Oral)  General:  Well developed, well nourished, in no acute distress. Head:  normocephalic and atraumatic Neck:  Neck supple, no JVD. No masses, thyromegaly or abnormal cervical nodes. Lungs:  Clear bilaterally to auscultation and percussion. Heart:  Non-displaced PMI, chest non-tender; regular rate and rhythm, S1, S2 with I-II SEM RSB, no rubs or gallops.  Carotid upstroke normal, no bruit.  Pedals normal pulses. No edema, no varicosities. Abdomen:  Bowel sounds positive; abdomen soft and non-tender without masses Msk:  Back normal, normal gait. Muscle strength and tone normal. Pulses:  pulses normal in all 4 extremities Extremities:  No clubbing or cyanosis. Neurologic:  Alert and oriented x 3. Skin:  Intact without lesions or rashes. Psych:  Normal affect.  Assessment and Plan: 1. General medical exam    I filled out he areas of the form that I could. I have not been monitoring her DM. I do feel she is safe to drive with the additional provisions made in her vehicle, i.e, hand controls, but I cannot fill out visual acuity or other parts of the form. Form returned to pt stating that I do feel she is ok to drive with regular  rechecks.

## 2011-05-20 LAB — BASIC METABOLIC PANEL
Chloride: 97
GFR calc non Af Amer: 5 — ABNORMAL LOW
Potassium: 5.3 — ABNORMAL HIGH
Sodium: 136

## 2011-05-20 LAB — CBC
HCT: 28.2 — ABNORMAL LOW
HCT: 30 — ABNORMAL LOW
Hemoglobin: 9.4 — ABNORMAL LOW
Hemoglobin: 9.5 — ABNORMAL LOW
MCHC: 33
MCV: 95.1
MCV: 96.3
Platelets: 211
RBC: 2.93 — ABNORMAL LOW
RBC: 2.98 — ABNORMAL LOW
RBC: 3.16 — ABNORMAL LOW
WBC: 6.3
WBC: 6.4

## 2011-05-20 LAB — RENAL FUNCTION PANEL
Albumin: 3 — ABNORMAL LOW
Albumin: 3.1 — ABNORMAL LOW
BUN: 111 — ABNORMAL HIGH
BUN: 89 — ABNORMAL HIGH
CO2: 24
CO2: 26
Calcium: 9.6
Chloride: 99
Chloride: 99
Chloride: 99
GFR calc Af Amer: 6 — ABNORMAL LOW
GFR calc non Af Amer: 5 — ABNORMAL LOW
Glucose, Bld: 252 — ABNORMAL HIGH
Potassium: 5
Potassium: 5.1
Potassium: 5.8 — ABNORMAL HIGH
Sodium: 136

## 2011-05-20 LAB — POTASSIUM: Potassium: 5.5 — ABNORMAL HIGH

## 2011-05-22 LAB — POCT I-STAT 4, (NA,K, GLUC, HGB,HCT)
Glucose, Bld: 96
HCT: 44
Operator id: 274481
Potassium: 5.1
Sodium: 137

## 2011-06-01 LAB — COMPREHENSIVE METABOLIC PANEL
AST: 17
AST: 25
Albumin: 3.7
Alkaline Phosphatase: 117
Alkaline Phosphatase: 81
BUN: 26 — ABNORMAL HIGH
BUN: 38 — ABNORMAL HIGH
CO2: 29
Chloride: 95 — ABNORMAL LOW
Chloride: 97
Creatinine, Ser: 5.21 — ABNORMAL HIGH
GFR calc Af Amer: 8 — ABNORMAL LOW
GFR calc non Af Amer: 9 — ABNORMAL LOW
Potassium: 4.5
Potassium: 6.1 — ABNORMAL HIGH
Total Bilirubin: 0.9
Total Protein: 6.3

## 2011-06-01 LAB — CBC
HCT: 22.7 — ABNORMAL LOW
HCT: 25.8 — ABNORMAL LOW
HCT: 26 — ABNORMAL LOW
HCT: 26.9 — ABNORMAL LOW
HCT: 28.4 — ABNORMAL LOW
HCT: 30.1 — ABNORMAL LOW
HCT: 33.4 — ABNORMAL LOW
HCT: 39.3
Hemoglobin: 11.2 — ABNORMAL LOW
Hemoglobin: 7.7 — CL
Hemoglobin: 8.5 — ABNORMAL LOW
Hemoglobin: 8.9 — ABNORMAL LOW
Hemoglobin: 8.9 — ABNORMAL LOW
Hemoglobin: 9.4 — ABNORMAL LOW
Hemoglobin: 9.6 — ABNORMAL LOW
Hemoglobin: 9.8 — ABNORMAL LOW
MCHC: 33
MCHC: 33.1
MCHC: 33.5
MCHC: 33.5
MCHC: 34
MCHC: 34.1
MCV: 98.7
MCV: 98.8
MCV: 98.9
MCV: 99.6
Platelets: 111 — ABNORMAL LOW
Platelets: 113 — ABNORMAL LOW
Platelets: 143 — ABNORMAL LOW
RBC: 2.3 — ABNORMAL LOW
RBC: 2.86 — ABNORMAL LOW
RBC: 2.97 — ABNORMAL LOW
RBC: 3.38 — ABNORMAL LOW
RBC: 3.94
RDW: 15.1
RDW: 15.3
RDW: 15.3
RDW: 16.1 — ABNORMAL HIGH
RDW: 16.8 — ABNORMAL HIGH
WBC: 10.2
WBC: 4.8
WBC: 5.1
WBC: 5.4
WBC: 5.8

## 2011-06-01 LAB — GLUCOSE, CAPILLARY
Glucose-Capillary: 117 — ABNORMAL HIGH
Glucose-Capillary: 126 — ABNORMAL HIGH
Glucose-Capillary: 130 — ABNORMAL HIGH
Glucose-Capillary: 134 — ABNORMAL HIGH
Glucose-Capillary: 140 — ABNORMAL HIGH
Glucose-Capillary: 141 — ABNORMAL HIGH
Glucose-Capillary: 146 — ABNORMAL HIGH
Glucose-Capillary: 152 — ABNORMAL HIGH
Glucose-Capillary: 161 — ABNORMAL HIGH
Glucose-Capillary: 178 — ABNORMAL HIGH
Glucose-Capillary: 182 — ABNORMAL HIGH
Glucose-Capillary: 193 — ABNORMAL HIGH
Glucose-Capillary: 222 — ABNORMAL HIGH
Glucose-Capillary: 222 — ABNORMAL HIGH
Glucose-Capillary: 233 — ABNORMAL HIGH
Glucose-Capillary: 297 — ABNORMAL HIGH
Glucose-Capillary: 76

## 2011-06-01 LAB — RENAL FUNCTION PANEL
Albumin: 3.3 — ABNORMAL LOW
Albumin: 3.3 — ABNORMAL LOW
BUN: 14
BUN: 26 — ABNORMAL HIGH
BUN: 38 — ABNORMAL HIGH
BUN: 40 — ABNORMAL HIGH
CO2: 27
CO2: 29
Calcium: 8.6
Calcium: 9.2
Calcium: 9.6
Chloride: 97
Chloride: 99
GFR calc Af Amer: 6 — ABNORMAL LOW
GFR calc Af Amer: 7 — ABNORMAL LOW
GFR calc non Af Amer: 5 — ABNORMAL LOW
GFR calc non Af Amer: 6 — ABNORMAL LOW
Glucose, Bld: 124 — ABNORMAL HIGH
Glucose, Bld: 243 — ABNORMAL HIGH
Glucose, Bld: 274 — ABNORMAL HIGH
Phosphorus: 4.5
Phosphorus: 6.1 — ABNORMAL HIGH
Phosphorus: 7.7 — ABNORMAL HIGH
Phosphorus: 9.2 — ABNORMAL HIGH
Potassium: 3.7
Potassium: 4.5
Potassium: 4.6
Potassium: 5
Sodium: 138
Sodium: 140

## 2011-06-01 LAB — BASIC METABOLIC PANEL
BUN: 53 — ABNORMAL HIGH
CO2: 28
CO2: 30
Calcium: 9.4
Chloride: 93 — ABNORMAL LOW
Chloride: 96
Creatinine, Ser: 6.51 — ABNORMAL HIGH
GFR calc Af Amer: 9 — ABNORMAL LOW
Glucose, Bld: 248 — ABNORMAL HIGH
Glucose, Bld: 249 — ABNORMAL HIGH
Potassium: 4.6
Potassium: 5
Potassium: 5.5 — ABNORMAL HIGH
Sodium: 137

## 2011-06-01 LAB — HEMOGLOBIN AND HEMATOCRIT, BLOOD
HCT: 27.5 — ABNORMAL LOW
HCT: 27.6 — ABNORMAL LOW
HCT: 28.8 — ABNORMAL LOW
Hemoglobin: 9.3 — ABNORMAL LOW
Hemoglobin: 9.4 — ABNORMAL LOW

## 2011-06-01 LAB — DIFFERENTIAL
Basophils Absolute: 0
Basophils Relative: 0
Eosinophils Relative: 2
Lymphocytes Relative: 9 — ABNORMAL LOW
Neutro Abs: 4.7

## 2011-06-01 LAB — OCCULT BLOOD X 1 CARD TO LAB, STOOL: Fecal Occult Bld: NEGATIVE

## 2011-06-01 LAB — HEMOGLOBIN: Hemoglobin: 8.2 — ABNORMAL LOW

## 2011-06-01 LAB — CROSSMATCH: Antibody Screen: NEGATIVE

## 2011-06-01 LAB — HEMOGLOBIN A1C: Hgb A1c MFr Bld: 7.3 — ABNORMAL HIGH

## 2011-06-01 LAB — PROTIME-INR: Prothrombin Time: 13.2

## 2011-06-01 LAB — HEPATITIS B SURFACE ANTIGEN: Hepatitis B Surface Ag: NEGATIVE

## 2011-06-02 LAB — POTASSIUM: Potassium: 5.6 — ABNORMAL HIGH

## 2011-06-02 LAB — BASIC METABOLIC PANEL
BUN: 39 — ABNORMAL HIGH
GFR calc Af Amer: 8 — ABNORMAL LOW
GFR calc non Af Amer: 7 — ABNORMAL LOW
Potassium: 5
Sodium: 137

## 2011-06-02 LAB — GLUCOSE, CAPILLARY
Glucose-Capillary: 101 — ABNORMAL HIGH
Glucose-Capillary: 105 — ABNORMAL HIGH
Glucose-Capillary: 114 — ABNORMAL HIGH
Glucose-Capillary: 130 — ABNORMAL HIGH
Glucose-Capillary: 187 — ABNORMAL HIGH
Glucose-Capillary: 232 — ABNORMAL HIGH
Glucose-Capillary: 241 — ABNORMAL HIGH
Glucose-Capillary: 277 — ABNORMAL HIGH
Glucose-Capillary: 91

## 2011-06-02 LAB — CBC
HCT: 29.5 — ABNORMAL LOW
Hemoglobin: 10.1 — ABNORMAL LOW
MCHC: 34.2
MCV: 96.4
RBC: 3.06 — ABNORMAL LOW
RDW: 17 — ABNORMAL HIGH
WBC: 6.4

## 2011-06-02 LAB — POCT I-STAT 4, (NA,K, GLUC, HGB,HCT)
Glucose, Bld: 409 — ABNORMAL HIGH
HCT: 35 — ABNORMAL LOW
Hemoglobin: 11.9 — ABNORMAL LOW
Sodium: 131 — ABNORMAL LOW

## 2011-06-02 LAB — RENAL FUNCTION PANEL
CO2: 26
Creatinine, Ser: 4.1 — ABNORMAL HIGH
Glucose, Bld: 131 — ABNORMAL HIGH
Potassium: 5
Sodium: 135

## 2011-06-05 LAB — POCT I-STAT CREATININE
Creatinine, Ser: 8.8 — ABNORMAL HIGH
Operator id: 196461

## 2011-06-05 LAB — CBC
HCT: 30.8 — ABNORMAL LOW
HCT: 35 — ABNORMAL LOW
Hemoglobin: 10.1 — ABNORMAL LOW
Hemoglobin: 11.7 — ABNORMAL LOW
MCHC: 32
MCHC: 33.5
MCV: 95.9
Platelets: 67 — ABNORMAL LOW
RBC: 3.2 — ABNORMAL LOW
RBC: 3.22 — ABNORMAL LOW
RDW: 17.1 — ABNORMAL HIGH
WBC: 11.4 — ABNORMAL HIGH
WBC: 8.5

## 2011-06-05 LAB — RENAL FUNCTION PANEL
Albumin: 2.7 — ABNORMAL LOW
CO2: 24
Calcium: 9.1
GFR calc Af Amer: 7 — ABNORMAL LOW
GFR calc non Af Amer: 5 — ABNORMAL LOW
Sodium: 132 — ABNORMAL LOW

## 2011-06-05 LAB — COMPREHENSIVE METABOLIC PANEL
Alkaline Phosphatase: 82
BUN: 46 — ABNORMAL HIGH
CO2: 28
Chloride: 93 — ABNORMAL LOW
Creatinine, Ser: 6.28 — ABNORMAL HIGH
GFR calc non Af Amer: 7 — ABNORMAL LOW
Total Bilirubin: 0.8

## 2011-06-05 LAB — I-STAT 8, (EC8 V) (CONVERTED LAB)
Acid-Base Excess: 4 — ABNORMAL HIGH
Chloride: 104
pCO2, Ven: 34.6 — ABNORMAL LOW
pH, Ven: 7.508 — ABNORMAL HIGH

## 2011-06-05 LAB — CULTURE, BLOOD (ROUTINE X 2)

## 2011-06-05 LAB — DIFFERENTIAL
Basophils Absolute: 0
Eosinophils Relative: 0
Lymphocytes Relative: 2 — ABNORMAL LOW
Monocytes Absolute: 1

## 2011-06-05 LAB — GRAM STAIN

## 2011-06-05 LAB — CATH TIP CULTURE: Culture: >100

## 2011-06-12 LAB — CBC
HCT: 41.7
HCT: 47.7 — ABNORMAL HIGH
MCHC: 32.7
MCHC: 32.9
MCHC: 33
MCV: 91.3
MCV: 91.7
Platelets: 124 — ABNORMAL LOW
Platelets: 38 — CL
Platelets: 40 — CL
Platelets: 48 — CL
Platelets: 75 — ABNORMAL LOW
Platelets: 80 — ABNORMAL LOW
RBC: 4.37
RBC: 4.44
RDW: 17.2 — ABNORMAL HIGH
RDW: 17.6 — ABNORMAL HIGH
RDW: 17.8 — ABNORMAL HIGH
RDW: 18.2 — ABNORMAL HIGH
RDW: 18.6 — ABNORMAL HIGH
WBC: 12.1 — ABNORMAL HIGH
WBC: 3.6 — ABNORMAL LOW
WBC: 3.7 — ABNORMAL LOW
WBC: 3.9 — ABNORMAL LOW
WBC: 4.8
WBC: 7.8
WBC: 9

## 2011-06-12 LAB — DIFFERENTIAL
Basophils Absolute: 0
Basophils Absolute: 0
Basophils Absolute: 0.1
Basophils Relative: 0
Basophils Relative: 1
Eosinophils Absolute: 0.2
Eosinophils Absolute: 1 — ABNORMAL HIGH
Eosinophils Relative: 0
Eosinophils Relative: 1
Eosinophils Relative: 11 — ABNORMAL HIGH
Lymphocytes Relative: 12
Lymphocytes Relative: 16
Lymphocytes Relative: 24
Lymphs Abs: 0.5 — ABNORMAL LOW
Lymphs Abs: 0.9
Monocytes Absolute: 1 — ABNORMAL HIGH
Monocytes Absolute: 1 — ABNORMAL HIGH
Monocytes Absolute: 1.4 — ABNORMAL HIGH
Monocytes Relative: 13 — ABNORMAL HIGH
Monocytes Relative: 13 — ABNORMAL HIGH
Monocytes Relative: 16 — ABNORMAL HIGH
Monocytes Relative: 8
Neutro Abs: 2.1
Neutro Abs: 2.1
Neutro Abs: 2.2
Neutrophils Relative %: 55
Neutrophils Relative %: 58
Neutrophils Relative %: 60
Neutrophils Relative %: 79 — ABNORMAL HIGH

## 2011-06-12 LAB — RENAL FUNCTION PANEL
Albumin: 2.7 — ABNORMAL LOW
Albumin: 2.7 — ABNORMAL LOW
Albumin: 2.8 — ABNORMAL LOW
Albumin: 3 — ABNORMAL LOW
Albumin: 3.3 — ABNORMAL LOW
BUN: 28 — ABNORMAL HIGH
BUN: 76 — ABNORMAL HIGH
CO2: 25
Calcium: 7.8 — ABNORMAL LOW
Calcium: 8.3 — ABNORMAL LOW
Chloride: 101
Chloride: 93 — ABNORMAL LOW
Chloride: 98
GFR calc Af Amer: 4 — ABNORMAL LOW
GFR calc Af Amer: 6 — ABNORMAL LOW
GFR calc non Af Amer: 3 — ABNORMAL LOW
GFR calc non Af Amer: 5 — ABNORMAL LOW
Glucose, Bld: 123 — ABNORMAL HIGH
Glucose, Bld: 161 — ABNORMAL HIGH
Glucose, Bld: 167 — ABNORMAL HIGH
Glucose, Bld: 171 — ABNORMAL HIGH
Phosphorus: 1.5 — ABNORMAL LOW
Phosphorus: 2.3
Phosphorus: 2.9
Phosphorus: 3.4
Potassium: 3.3 — ABNORMAL LOW
Potassium: 3.7
Potassium: 3.8
Potassium: 4.3
Sodium: 131 — ABNORMAL LOW
Sodium: 136
Sodium: 137
Sodium: 138
Sodium: 139
Sodium: 141

## 2011-06-12 LAB — BASIC METABOLIC PANEL
BUN: 50 — ABNORMAL HIGH
CO2: 25
Calcium: 7.6 — ABNORMAL LOW
Creatinine, Ser: 9.07 — ABNORMAL HIGH
GFR calc non Af Amer: 5 — ABNORMAL LOW
Glucose, Bld: 134 — ABNORMAL HIGH

## 2011-06-12 LAB — COMPREHENSIVE METABOLIC PANEL
ALT: 18
AST: 18
Albumin: 3.9
Alkaline Phosphatase: 122 — ABNORMAL HIGH
BUN: 81 — ABNORMAL HIGH
Chloride: 93 — ABNORMAL LOW
GFR calc Af Amer: 6 — ABNORMAL LOW
Potassium: 4.2
Sodium: 135
Total Bilirubin: 1.4 — ABNORMAL HIGH
Total Protein: 7.3

## 2011-06-12 LAB — CORTISOL-AM, BLOOD: Cortisol - AM: 19.4

## 2011-06-12 LAB — CULTURE, BLOOD (ROUTINE X 2)

## 2011-06-17 LAB — POCT I-STAT 4, (NA,K, GLUC, HGB,HCT)
Glucose, Bld: 68 — ABNORMAL LOW
HCT: 37
Operator id: 181601
Sodium: 134 — ABNORMAL LOW

## 2011-06-18 LAB — POCT I-STAT 4, (NA,K, GLUC, HGB,HCT)
HCT: 35 — ABNORMAL LOW
Hemoglobin: 11.9 — ABNORMAL LOW

## 2011-12-03 ENCOUNTER — Ambulatory Visit (INDEPENDENT_AMBULATORY_CARE_PROVIDER_SITE_OTHER): Payer: Medicare Other | Admitting: Family Medicine

## 2011-12-03 ENCOUNTER — Encounter: Payer: Self-pay | Admitting: Family Medicine

## 2011-12-03 VITALS — BP 112/58 | HR 64 | Temp 97.7°F

## 2011-12-03 DIAGNOSIS — E1142 Type 2 diabetes mellitus with diabetic polyneuropathy: Secondary | ICD-10-CM

## 2011-12-03 DIAGNOSIS — E119 Type 2 diabetes mellitus without complications: Secondary | ICD-10-CM

## 2011-12-03 DIAGNOSIS — E1139 Type 2 diabetes mellitus with other diabetic ophthalmic complication: Secondary | ICD-10-CM

## 2011-12-03 DIAGNOSIS — E1149 Type 2 diabetes mellitus with other diabetic neurological complication: Secondary | ICD-10-CM

## 2011-12-03 DIAGNOSIS — H579 Unspecified disorder of eye and adnexa: Secondary | ICD-10-CM

## 2011-12-03 DIAGNOSIS — N19 Unspecified kidney failure: Secondary | ICD-10-CM

## 2011-12-03 NOTE — Progress Notes (Signed)
46 year old woman with a history of end-stage renal disease, on dialysis 3 times per week, echocardiogram in 2009 showing normal systolic function, cardiac catheterization in 2007 showing no significant coronary artery disease with obstructive sleep apnea, currently not on CPAP, diabetes that is poorly controlled, anemia, depression here to have forms filled out for work.   I have not seen [name] in almost a year (last office visit was to fill out DMV paper work in 03/2011).  She has been following regularly with her nephrologist. Per pt, her a1c this month was 9.5. Now taking 100 units of novolin 70/30 daily. Denies any episodes of hypoglyemia.   Here to have her disability forms filled out. Has been disabled due to ESRD and diabetic neuropathy for years. Wheelchair bound, non ambulatory. Was in the process of getting set up for renal transplant but ultimately denied due to poor renal flow. She is understandably disappointed.  Patient Active Problem List  Diagnoses  . DIABETES MELLITUS, TYPE II  . DIABETIC  RETINOPATHY  . DIABETIC PERIPHERAL NEUROPATHY  . MORBID OBESITY  . ADJ DISORDER WITH MIXED ANXIETY & DEPRESSED MOOD  . UNSPECIFIED HYPOTENSION  . PULMONARY NODULE, SOLITARY  . RENAL FAILURE  . SECONDARY HYPERPARATHYROIDISM  . ABSCESS  . DIZZINESS  . General medical exam   Past Medical History  Diagnosis Date  . Diabetes mellitus     type 2  . Renal failure     end stage, on dialysis since 05/2002  . Idiopathic parathyroidism     secondary, hyper  . Lung nodule     left lung   Past Surgical History  Procedure Date  . Cataract extraction   . Toe amputation     right foot  . Fistula surgery     multiple graft   History  Substance Use Topics  . Smoking status: Never Smoker   . Smokeless tobacco: Not on file  . Alcohol Use: No   Family History  Problem Relation Age of Onset  . Hypertension Mother   . Hypertension Father    Allergies  Allergen Reactions  .  Bupropion Hcl     REACTION: Bites nails  . Morphine     REACTION: ?   Current Outpatient Prescriptions on File Prior to Visit  Medication Sig Dispense Refill  . calcium acetate (PHOSLO) 667 MG capsule Take 4 capsules three times daily with meals       . cinacalcet (SENSIPAR) 60 MG tablet Take 60 mg by mouth daily.        Marland Kitchen gabapentin (NEURONTIN) 100 MG tablet Take 100 mg by mouth 2 (two) times daily.        . insulin NPH-insulin regular (NOVOLIN 70/30) (70-30) 100 UNIT/ML injection Inject 60 Units into the skin at bedtime.        . midodrine (PROAMATINE) 10 MG tablet Take 10 mg by mouth daily.        . Multiple Vitamin (RENAL MULTIVITAMIN/ZINC) TABS Take 1 tablet by mouth daily.        Marland Kitchen omeprazole (PRILOSEC) 40 MG capsule Take 40 mg by mouth daily.         The PMH, PSH, Social History, Family History, Medications, and allergies have been reviewed in [facility], and have been updated if relevant.     The PMH, PSH, Social History, Family History, Medications, and allergies have been reviewed in [facility], and have been updated if relevant.     ROS: See HPI     Physical Exam BP  112/58  Pulse 64  Temp(Src) 97.7 F (36.5 C) (Oral)    General:  Well developed, well nourished, in no acute distress. Head:  normocephalic and atraumatic Neck:  Neck supple, no JVD. No masses, thyromegaly or abnormal cervical nodes. Lungs:  Clear bilaterally to auscultation and percussion. Heart:  Non-displaced PMI, chest non-tender; regular rate and rhythm, S1, S2 with I-II SEM RSB, no rubs or gallops. Carotid upstroke normal, no bruit.  Pedals normal pulses. No edema, no varicosities. Abdomen:  Bowel sounds positive; abdomen soft and non-tender without masses Msk:  Back normal, normal gait. Muscle strength and tone normal. Pulses:  pulses normal in all 4 extremities Extremities:  No clubbing or cyanosis. Neurologic:  Alert and oriented x 3. Skin:  Intact without lesions or rashes. Psych:  Normal affect, in  wheelchair.   Assessment and Plan: 1.  General medical exam for disability- forms filled out and faxed to number provided. I agree that she is unable to perform her job due to her disability- cannot care for children in her current condition and there is no indication that her conditions will improve.       I filled out he areas of the form that I could. I have not been monitoring her DM. I do feel she is safe to drive with the additional provisions made in her vehicle, i.e, hand controls, but I cannot fill out visual acuity or other parts of the form. Form returned to pt stating that I do feel she is ok to drive with regular rechecks.

## 2011-12-15 ENCOUNTER — Telehealth: Payer: Self-pay

## 2011-12-15 NOTE — Telephone Encounter (Signed)
Pt left v/m that documentation for disability pg 3 did not go through and pt request entire documentation of disability be refaxed. Pt wants call back at [phone] when done. I looked under media and I do not see scanned info.

## 2011-12-15 NOTE — Telephone Encounter (Signed)
Dr Dayton Martes, new forms were faxed to [place] from the state, forms pt brought in are old.  I have completed what I can on the forms, they are on your desk for your review, along with the old forms for you to go by.

## 2011-12-16 NOTE — Telephone Encounter (Signed)
Completed, in my box

## 2011-12-16 NOTE — Telephone Encounter (Signed)
Forms faxed, along with medicine list.  Advised patient.

## 2012-02-08 ENCOUNTER — Telehealth: Payer: Self-pay | Admitting: Family Medicine

## 2012-02-08 NOTE — Telephone Encounter (Signed)
I'm sorry to hear that. Yes, I am ok with her receiving hospice services.  Please let me know what else we can do.

## 2012-02-08 NOTE — Telephone Encounter (Signed)
Per Ethelle Lyon with Hospice, the patient has recently received news that her vascular access for dialysis is "dried up" and needs Hospice.  The Hospice services have talked to patient and determined that she would like their services.  Please call Selena Batten back at [phone] and let her know if Dr. Dayton Martes is ok with this plan. Thanks

## 2012-02-08 NOTE — Telephone Encounter (Signed)
[name] at [facility].  Copy of med list, snap shot, demographic sheet and last office note faxed to her at fax number [phone].

## 2012-02-08 NOTE — Telephone Encounter (Signed)
Thank you :)

## 2012-05-10 ENCOUNTER — Ambulatory Visit: Payer: Self-pay | Admitting: Oncology

## 2012-05-10 LAB — CBC CANCER CENTER
Basophil %: 1.4 %
Eosinophil %: 5 %
HCT: 47.4 % — ABNORMAL HIGH (ref 35.0–47.0)
Lymphocyte #: 0.8 x10 3/mm — ABNORMAL LOW (ref 1.0–3.6)
MCH: 33.3 pg (ref 26.0–34.0)
MCHC: 32.8 g/dL (ref 32.0–36.0)
MCV: 101 fL — ABNORMAL HIGH (ref 80–100)
Monocyte #: 0.6 x10 3/mm (ref 0.2–0.9)
Monocyte %: 13.2 %
Neutrophil %: 64.2 %
RDW: 16.2 % — ABNORMAL HIGH (ref 11.5–14.5)
WBC: 4.8 x10 3/mm (ref 3.6–11.0)

## 2012-05-10 LAB — IRON AND TIBC
Iron: 68 ug/dL (ref 50–170)
Unbound Iron-Bind.Cap.: 183 ug/dL

## 2012-05-11 LAB — PROT IMMUNOELECTROPHORES(ARMC)

## 2012-05-19 ENCOUNTER — Ambulatory Visit: Payer: Self-pay | Admitting: Vascular Surgery

## 2012-05-31 ENCOUNTER — Ambulatory Visit: Payer: Self-pay | Admitting: Oncology

## 2012-06-16 ENCOUNTER — Encounter: Payer: Self-pay | Admitting: Cardiothoracic Surgery

## 2012-06-16 ENCOUNTER — Encounter: Payer: Self-pay | Admitting: Nurse Practitioner

## 2012-07-19 ENCOUNTER — Ambulatory Visit: Payer: Self-pay | Admitting: Vascular Surgery

## 2012-08-04 ENCOUNTER — Encounter: Payer: Self-pay | Admitting: Nurse Practitioner

## 2012-08-04 ENCOUNTER — Encounter: Payer: Self-pay | Admitting: Cardiothoracic Surgery

## 2012-08-09 ENCOUNTER — Ambulatory Visit: Payer: Self-pay | Admitting: Gastroenterology

## 2012-08-31 ENCOUNTER — Encounter: Payer: Self-pay | Admitting: Nurse Practitioner

## 2012-08-31 ENCOUNTER — Encounter: Payer: Self-pay | Admitting: Cardiothoracic Surgery

## 2012-10-01 ENCOUNTER — Encounter: Payer: Self-pay | Admitting: Cardiothoracic Surgery

## 2012-10-01 ENCOUNTER — Encounter: Payer: Self-pay | Admitting: Nurse Practitioner

## 2012-10-06 ENCOUNTER — Ambulatory Visit: Payer: Self-pay | Admitting: Vascular Surgery

## 2012-10-06 LAB — CBC
HCT: 48.8 % — ABNORMAL HIGH (ref 35.0–47.0)
HGB: 15.4 g/dL (ref 12.0–16.0)
MCHC: 31.6 g/dL — ABNORMAL LOW (ref 32.0–36.0)
MCV: 106 fL — ABNORMAL HIGH (ref 80–100)
Platelet: 124 10*3/uL — ABNORMAL LOW (ref 150–440)
RBC: 4.58 10*6/uL (ref 3.80–5.20)
RDW: 18.1 % — ABNORMAL HIGH (ref 11.5–14.5)

## 2012-10-06 LAB — BASIC METABOLIC PANEL
BUN: 38 mg/dL — ABNORMAL HIGH (ref 7–18)
Calcium, Total: 10.7 mg/dL — ABNORMAL HIGH (ref 8.5–10.1)
Chloride: 99 mmol/L (ref 98–107)
EGFR (Non-African Amer.): 8 — ABNORMAL LOW
Glucose: 146 mg/dL — ABNORMAL HIGH (ref 65–99)
Osmolality: 289 (ref 275–301)
Potassium: 4.8 mmol/L (ref 3.5–5.1)

## 2012-10-19 ENCOUNTER — Ambulatory Visit: Payer: Self-pay | Admitting: Vascular Surgery

## 2012-10-19 LAB — POTASSIUM: Potassium: 6.3 mmol/L — ABNORMAL HIGH (ref 3.5–5.1)

## 2012-10-28 ENCOUNTER — Ambulatory Visit: Payer: Self-pay | Admitting: Vascular Surgery

## 2012-10-31 LAB — PATHOLOGY REPORT

## 2012-12-20 ENCOUNTER — Ambulatory Visit: Payer: Self-pay | Admitting: Vascular Surgery

## 2013-01-04 ENCOUNTER — Telehealth: Payer: Self-pay

## 2013-01-04 NOTE — Telephone Encounter (Signed)
April nurse with [facility]; Nephrologist has been ordering her insulin 70/30 and now her insurance will not cover that insulin. Pt does not have enough insulin to last until April can get appt with endocrinologist. April wants to know if Dr Dayton Martes could write an alternative insulin until April can get appt with endocrinologist. April request call back.

## 2013-01-04 NOTE — Telephone Encounter (Signed)
Please call nephrologist who is ordering it since we have not seen her in over a year and have not been managing this.

## 2013-01-05 NOTE — Telephone Encounter (Signed)
Called nurse back, she's not in today.  She will be back tomorrow, and I will try then.

## 2013-01-06 NOTE — Telephone Encounter (Signed)
Spoke with April.  Pt's endocrinology appt has been changed to may 20th, so patient should have enough insulin to last till then, but April will call back if she does not.

## 2013-01-17 ENCOUNTER — Ambulatory Visit: Payer: BC Managed Care – PPO | Admitting: Endocrinology

## 2013-01-24 ENCOUNTER — Encounter: Payer: Self-pay | Admitting: Endocrinology

## 2013-01-24 ENCOUNTER — Ambulatory Visit (INDEPENDENT_AMBULATORY_CARE_PROVIDER_SITE_OTHER): Payer: Medicare Other | Admitting: Endocrinology

## 2013-01-24 VITALS — BP 132/74 | HR 73 | Ht 68.0 in | Wt 250.8 lb

## 2013-01-24 DIAGNOSIS — R079 Chest pain, unspecified: Secondary | ICD-10-CM

## 2013-01-24 DIAGNOSIS — E119 Type 2 diabetes mellitus without complications: Secondary | ICD-10-CM

## 2013-01-24 DIAGNOSIS — R9431 Abnormal electrocardiogram [ECG] [EKG]: Secondary | ICD-10-CM

## 2013-01-24 MED ORDER — INSULIN LISPRO 100 UNIT/ML (KWIKPEN): 25.0000 [IU] | PEN_INJECTOR | Freq: Three times a day (TID) | SUBCUTANEOUS | Status: DC

## 2013-01-24 MED ORDER — GLUCOSE BLOOD VI STRP: 1.0000 | ORAL_STRIP | Freq: Two times a day (BID) | Status: DC

## 2013-01-24 NOTE — Patient Instructions (Addendum)
good diet and exercise habits significanly improve the control of your diabetes.  please let me know if you wish to be referred to a dietician.  high blood sugar is very risky to your health.  you should see an eye doctor every year.  You are at higher than average risk for pneumonia and hepatitis-B.  You should be vaccinated against both.   controlling your blood pressure and cholesterol drastically reduces the damage diabetes does to your body.  this also applies to quitting smoking.  please discuss these with your doctor.  you should take an aspirin every day, unless you have been advised by a doctor not to. check your blood sugar twice a day.  vary the time of day when you check, between before the 3 meals, and at bedtime.  also check if you have symptoms of your blood sugar being too high or too low.  please keep a record of the readings and bring it to your next appointment here.  please call us sooner if your blood sugar goes below 70, or if you have a lot of readings over 200. Let's check an "echocardiogram."  you will receive a phone call, about a day and time for an appointment.   Here is a new meter.  i have sent a prescription to your pharmacy, for test strips.   Change your current insulin to humalog, 25 units 3 times a day (just before each meal).   Please come back for a follow-up appointment in 2-4 weeks.

## 2013-01-24 NOTE — Progress Notes (Signed)
Subjective:    Patient ID: [name], female    DOB: 1965/12/21, 47 y.o.   MRN: [id number]  HPI pt states 25 years h/o dm.  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  he has been on insulin since dx.  pt says her diet is not good, and exercise is limited by health problems.  He daily insulin dosage varies from 20-100 units qhs.  She says cbg's are well-controlled.  She is aware of having hypoglycemia only very seldom.  She eats only 2 meals per day.   Past Medical History  Diagnosis Date  . Diabetes mellitus     type 2  . Renal failure     end stage, on dialysis since 05/2002  . Idiopathic parathyroidism     secondary, hyper  . Lung nodule     left lung    Past Surgical History  Procedure Laterality Date  . Cataract extraction    . Toe amputation      right foot  . Fistula surgery      multiple graft    History   Social History  . Marital Status: Married    Spouse Name: N/A    Number of Children: N/A  . Years of Education: N/A   Occupational History  . Not on file.   Social History Main Topics  . Smoking status: Never Smoker   . Smokeless tobacco: Not on file  . Alcohol Use: No  . Drug Use:   . Sexually Active:    Other Topics Concern  . Not on file   Social History Narrative   Lives with husband in [place].  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    Current Outpatient Prescriptions on File Prior to Visit  Medication Sig Dispense Refill  . calcium acetate (PHOSLO) 667 MG capsule Take 4 capsules three times daily with meals       . cinacalcet (SENSIPAR) 60 MG tablet Take 60 mg by mouth daily.        Marland Kitchen gabapentin (NEURONTIN) 100 MG tablet Take 100 mg by mouth 2 (two) times daily.        . midodrine (PROAMATINE) 10 MG tablet Take 10 mg by mouth daily.        . Multiple Vitamin (RENAL MULTIVITAMIN/ZINC) TABS Take 1 tablet by mouth  daily.        Marland Kitchen omeprazole (PRILOSEC) 40 MG capsule Take 40 mg by mouth daily.         No current facility-administered medications on file prior to visit.   Allergies  Allergen Reactions  . Bupropion Hcl     REACTION: Bites nails  . Morphine     REACTION: ?   Family History  Problem Relation Age of Onset  . Hypertension Mother   . Hypertension Father    BP 132/74  Pulse 73  Ht 5\' 8"  (1.727 m)  Wt 250 lb 12.8 oz (113.762 kg)  BMI 38.14 kg/m2  SpO2 97%  Review of Systems denies blurry vision, chest pain, sob, n/v, excessive diaphoresis, memory loss, depression, rhinorrhea, and easy bruising.  She has numbness and pain of all 4 extremities.   She has lost a few lbs.  She had intermittent headache and muscle cramps.  She is anuric.  She has irregular menses.      Objective:   Physical Exam VS: see vs page GEN: no distress.  Morbid obesity.  In wheelchair.   HEAD: head: no deformity eyes: no periorbital swelling, no proptosis external nose and ears are normal mouth: no lesion seen NECK: supple, thyroid is not enlarged CHEST WALL: no deformity LUNGS:  Clear to auscultation CV: reg rate and rhythm, no murmur ABD: abdomen is soft, nontender.  no hepatosplenomegaly.  not distended.  no hernia MUSCULOSKELETAL: muscle bulk and strength are grossly normal.  no obvious joint swelling.  gait is normal and steady EXTEMITIES: no deformity.  no ulcer on the feet.  feet are of normal color and temp.  no edema.   PULSES: dorsalis pedis intact bilat.  no carotid bruit.   NEURO:  cn 2-12 grossly intact.   readily moves all 4's.  sensation is intact to touch on the feet.   SKIN:  Normal texture and temperature.  No rash or suspicious lesion is visible.   NODES:  None palpable at the neck.   PSYCH: alert, oriented x3.  Does not appear anxious nor depressed.     (pt says her a1c was 6.9, 2 weeks ago)    Assessment & Plan:  DM: Based on the pattern of her cbg's, she needs some adjustment  in her therapy.  The best insulin option appears to be multiple daily injections. Renal failure: because of this, she is at risk for hypoglycemia. Neuropathy: this limits exercise rx of DM Abnormal ecg, new.  She is at very high risk for heart disease.

## 2013-02-06 ENCOUNTER — Other Ambulatory Visit (HOSPITAL_COMMUNITY): Payer: Self-pay | Admitting: Endocrinology

## 2013-02-06 DIAGNOSIS — R072 Precordial pain: Secondary | ICD-10-CM

## 2013-02-07 ENCOUNTER — Ambulatory Visit (HOSPITAL_COMMUNITY): Payer: Medicare Other | Attending: Cardiovascular Disease | Admitting: Radiology

## 2013-02-07 ENCOUNTER — Other Ambulatory Visit (HOSPITAL_COMMUNITY): Payer: Self-pay | Admitting: Cardiology

## 2013-02-07 DIAGNOSIS — R9431 Abnormal electrocardiogram [ECG] [EKG]: Secondary | ICD-10-CM

## 2013-02-07 DIAGNOSIS — R072 Precordial pain: Secondary | ICD-10-CM | POA: Insufficient documentation

## 2013-02-07 MED ORDER — PERFLUTREN PROTEIN A MICROSPH IV SUSP
2.0000 mL | Freq: Once | INTRAVENOUS | Status: AC
  Administered 2013-02-07: 2 mL via INTRAVENOUS

## 2013-02-07 NOTE — Progress Notes (Signed)
Echocardiogram performed with optison.  

## 2013-02-21 ENCOUNTER — Ambulatory Visit: Payer: BC Managed Care – PPO | Admitting: Endocrinology

## 2013-02-21 DIAGNOSIS — Z0289 Encounter for other administrative examinations: Secondary | ICD-10-CM

## 2013-03-12 ENCOUNTER — Other Ambulatory Visit: Payer: Self-pay | Admitting: Endocrinology

## 2013-03-12 MED ORDER — INSULIN ASPART 100 UNIT/ML FLEXPEN: 25.0000 [IU] | PEN_INJECTOR | Freq: Three times a day (TID) | SUBCUTANEOUS | Status: DC

## 2013-03-30 LAB — HM MAMMOGRAPHY

## 2013-04-04 ENCOUNTER — Encounter: Payer: Self-pay | Admitting: Family Medicine

## 2013-04-10 ENCOUNTER — Encounter: Payer: Self-pay | Admitting: Family Medicine

## 2013-04-20 DIAGNOSIS — R928 Other abnormal and inconclusive findings on diagnostic imaging of breast: Secondary | ICD-10-CM | POA: Insufficient documentation

## 2013-05-10 ENCOUNTER — Encounter: Payer: Self-pay | Admitting: Internal Medicine

## 2013-06-06 ENCOUNTER — Encounter: Payer: Self-pay | Admitting: Internal Medicine

## 2013-06-08 ENCOUNTER — Telehealth: Payer: Self-pay | Admitting: Internal Medicine

## 2013-06-08 ENCOUNTER — Ambulatory Visit: Payer: BC Managed Care – PPO | Admitting: Internal Medicine

## 2013-06-14 NOTE — Telephone Encounter (Signed)
No charge per Dr. Pyrtle °

## 2013-06-14 NOTE — Telephone Encounter (Signed)
No charge. 

## 2013-06-27 ENCOUNTER — Ambulatory Visit: Payer: BC Managed Care – PPO | Admitting: Internal Medicine

## 2013-07-06 DIAGNOSIS — D249 Benign neoplasm of unspecified breast: Secondary | ICD-10-CM | POA: Insufficient documentation

## 2013-07-07 ENCOUNTER — Emergency Department: Payer: Self-pay | Admitting: Internal Medicine

## 2013-07-07 LAB — BASIC METABOLIC PANEL
Anion Gap: 10 (ref 7–16)
BUN: 28 mg/dL — ABNORMAL HIGH (ref 7–18)
Calcium, Total: 9.2 mg/dL (ref 8.5–10.1)
Co2: 29 mmol/L (ref 21–32)
EGFR (African American): 12 — ABNORMAL LOW
EGFR (Non-African Amer.): 10 — ABNORMAL LOW
Osmolality: 282 (ref 275–301)
Potassium: 4.4 mmol/L (ref 3.5–5.1)
Sodium: 137 mmol/L (ref 136–145)

## 2013-07-07 LAB — CBC
HGB: 14.1 g/dL (ref 12.0–16.0)
MCHC: 33.6 g/dL (ref 32.0–36.0)
MCV: 101 fL — ABNORMAL HIGH (ref 80–100)
Platelet: 134 10*3/uL — ABNORMAL LOW (ref 150–440)
RBC: 4.13 10*6/uL (ref 3.80–5.20)

## 2013-07-07 LAB — CK TOTAL AND CKMB (NOT AT ARMC): CK, Total: 32 U/L (ref 21–215)

## 2013-07-07 LAB — TROPONIN I
Troponin-I: <0.02 ng/mL
Troponin-I: <0.02 ng/mL

## 2013-08-10 ENCOUNTER — Ambulatory Visit: Payer: BC Managed Care – PPO | Admitting: Cardiovascular Disease

## 2013-08-15 ENCOUNTER — Ambulatory Visit (INDEPENDENT_AMBULATORY_CARE_PROVIDER_SITE_OTHER): Payer: Medicare Other | Admitting: Interventional Cardiology

## 2013-08-15 ENCOUNTER — Encounter: Payer: Self-pay | Admitting: Interventional Cardiology

## 2013-08-15 VITALS — BP 120/60 | HR 74 | Ht 68.0 in | Wt 250.0 lb

## 2013-08-15 DIAGNOSIS — Z8249 Family history of ischemic heart disease and other diseases of the circulatory system: Secondary | ICD-10-CM | POA: Insufficient documentation

## 2013-08-15 DIAGNOSIS — R079 Chest pain, unspecified: Secondary | ICD-10-CM | POA: Insufficient documentation

## 2013-08-15 DIAGNOSIS — I951 Orthostatic hypotension: Secondary | ICD-10-CM

## 2013-08-15 DIAGNOSIS — N186 End stage renal disease: Secondary | ICD-10-CM | POA: Insufficient documentation

## 2013-08-15 NOTE — Patient Instructions (Signed)
Your physician has requested that you have a lexiscan myoview. For further information please visit [email or web address]. Please follow instruction sheet, as given.   

## 2013-08-15 NOTE — Progress Notes (Signed)
Patient ID: [name], female   DOB: Feb 13, 1966, 47 y.o.   MRN: [id number]     Patient ID: [name] MRN: [id number] DOB/AGE: 08/29/1966 47 y.o.   Referring Physician Dr. Enos Fling   Reason for Consultation Chest pain  HPI: 47 y/o who has ESRD and dialysis on MWF.  Three weeks ago, she had a chest pain during the treatment.  Her treatement was stopped.  Her ECG was abnormal and she was sent to the ER.  She ruled out for MI.  She was sent home.    THe patient has had a stress test many years ago.  She had a heart cath more than 5 years ago that was negative.  2-3 years ago, she had another stress test at [facility] as part of w/u.  No problems with th eheart identified.  She was turned down for kidney transplant.  Walking is limited.  Walking in parking lots is her most signifant.  No CP associated with walking; but she is in a wheelchair mosst of the time.    When she went to ER for CP, mild relief with SL NTG spray.  CP episode lasted for 5-10 minutes.  It felt tight and there was some pressure.  SHOB with the pain.  No problems on more recent dialysis treatments, since the CP episode.   Current Outpatient Prescriptions  Medication Sig Dispense Refill  . B-D ULTRAFINE III SHORT PEN 31G X 8 MM MISC       . cinacalcet (SENSIPAR) 60 MG tablet Take 60 mg by mouth daily.        . clopidogrel (PLAVIX) 75 MG tablet Take 75 mg by mouth daily.      Marland Kitchen doxycycline (VIBRAMYCIN) 100 MG capsule Take 100 mg by mouth daily.       Marland Kitchen FOSRENOL 1000 MG chewable tablet Chew 1,000 mg by mouth 3 (three) times daily with meals.       . gabapentin (NEURONTIN) 100 MG tablet Take 100 mg by mouth 2 (two) times daily. 200 mg  in the a.m. And 100 mg  at night      . glucose blood (ONETOUCH VERIO) test strip 1 each by Other route 2 (two) times daily. And lancets 2/day 250.01  100 each  12  . insulin aspart (NOVOLOG FLEXPEN) 100 UNIT/ML SOPN FlexPen Inject 25 Units into the skin 3 (three) times daily  with meals. And pen needles 3/day  10 pen  11  . LANTUS SOLOSTAR 100 UNIT/ML SOPN Take 50 mg by mouth Nightly.       . midodrine (PROAMATINE) 10 MG tablet Take 10 mg by mouth 3 (three) times daily. Take two 10 mg tabs 3 times a day      . Multiple Vitamin (RENAL MULTIVITAMIN/ZINC) TABS Take 1 tablet by mouth daily.        Marland Kitchen omeprazole (PRILOSEC) 40 MG capsule Take 40 mg by mouth daily.        . silver sulfADIAZINE (SILVADENE) 1 % cream prn       No current facility-administered medications for this visit.   Past Medical History  Diagnosis Date  . Diabetes mellitus     type 2  . Renal failure     end stage, on dialysis since 05/2002  . Idiopathic parathyroidism     secondary, hyper  . Lung nodule     left lung    Family History  Problem Relation Age of Onset  . Hypertension Mother   .  Hypertension Father     History   Social History  . Marital Status: Married    Spouse Name: N/A    Number of Children: N/A  . Years of Education: N/A   Occupational History  . Not on file.   Social History Main Topics  . Smoking status: Never Smoker   . Smokeless tobacco: Not on file  . Alcohol Use: No  . Drug Use:   . Sexual Activity:    Other Topics Concern  . Not on file   Social History Narrative   Lives with husband in [place].  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    Past Surgical History  Procedure Laterality Date  . Cataract extraction    . Toe amputation      right foot  . Fistula surgery      multiple graft      (Not in a hospital admission)  Review of systems complete and found to be negative unless listed above .  No nausea, vomiting.  No fever chills, No focal weakness,  No palpitations.  Physical Exam: Filed Vitals:   08/15/13 1154  BP: 120/60  Pulse: 74    Weight: 250 lb (113.399 kg)  Physical exam:  [place]/AT EOMI No JVD, No carotid bruit RRR S1S2  No wheezing Soft. NT, nondistended No edema. No focal motor  or sensory deficits Normal affect  Labs:   Lab Results  Component Value Date   WBC 8.1 04/22/2009   HGB 14.6 04/24/2010   HCT 43.0 04/24/2010   MCV 97.8 04/22/2009   PLT 107* 04/22/2009   No results found for this basename: NA, K, CL, CO2, BUN, CREATININE, CALCIUM, LABALBU, PROT, BILITOT, ALKPHOS, ALT, AST, GLUCOSE,  in the last 168 hours No results found for this basename: CKTOTAL, CKMB, CKMBINDEX, TROPONINI    No results found for this basename: CHOL   No results found for this basename: HDL   No results found for this basename: LDLCALC   No results found for this basename: TRIG   No results found for this basename: CHOLHDL   No results found for this basename: LDLDIRECT       EKG: NSR, poor R wave progression  ASSESSMENT AND PLAN:  Chest pain: Need to r/o ischemia.  She has been on plavix due easy clotting of shunts. And some relief with nitroglycerin. This is most typical feature. She has many risk factors for coronary artery disease. We'll plan for pharmacologic stress test.  Unable to walk on the treadmill. Hypotension: Using midodrine.  This limits choice of antianginal therapy. Could consider Ranexa chest pain comes back. She may not tolerate a beta blocker or sustain nitrates due to the hypotension.  ESRD Dialysis 3x/week.  Multiple issues with clotted shunts.  Access for cath would likely have to be right groin if this is necessary in the future.   Family history of early coronary artery disease:  she'll need aggressive risk factor modification.  WOuld try to keep LDL < 100.  Signed:   Fredric Mare, MD, [facility] 08/15/2013, 12:24 PM

## 2013-09-07 ENCOUNTER — Encounter: Payer: Self-pay | Admitting: Cardiology

## 2013-09-07 ENCOUNTER — Ambulatory Visit (HOSPITAL_COMMUNITY): Payer: Medicare Other | Attending: Interventional Cardiology | Admitting: Radiology

## 2013-09-07 VITALS — BP 92/50 | Ht 68.0 in | Wt 248.0 lb

## 2013-09-07 DIAGNOSIS — Z794 Long term (current) use of insulin: Secondary | ICD-10-CM | POA: Insufficient documentation

## 2013-09-07 DIAGNOSIS — R079 Chest pain, unspecified: Secondary | ICD-10-CM | POA: Insufficient documentation

## 2013-09-07 DIAGNOSIS — Z8249 Family history of ischemic heart disease and other diseases of the circulatory system: Secondary | ICD-10-CM | POA: Insufficient documentation

## 2013-09-07 DIAGNOSIS — E109 Type 1 diabetes mellitus without complications: Secondary | ICD-10-CM | POA: Insufficient documentation

## 2013-09-07 DIAGNOSIS — R42 Dizziness and giddiness: Secondary | ICD-10-CM | POA: Insufficient documentation

## 2013-09-07 MED ORDER — TECHNETIUM TC 99M SESTAMIBI GENERIC - CARDIOLITE
33.0000 | Freq: Once | INTRAVENOUS | Status: AC | PRN
Start: 2013-09-07 — End: 2013-09-07
  Administered 2013-09-07: 33 via INTRAVENOUS

## 2013-09-07 MED ORDER — REGADENOSON 0.4 MG/5ML IV SOLN
0.4000 mg | Freq: Once | INTRAVENOUS | Status: AC
  Administered 2013-09-07: 0.4 mg via INTRAVENOUS

## 2013-09-07 NOTE — Progress Notes (Signed)
[facility] [address] [place], [place] [address] [phone]    Cardiology Nuclear Med Study  [name] is a 48 y.o. female     MRN : [id number]     DOB: 06-05-1966  Procedure Date: 09/07/2013  Nuclear Med Background Indication for Stress Test:  Evaluation for Ischemia and [facility] (ER 3 wks ago-chest pain) History:  no known hx of CAD; Echo 2014-EF 60% Cardiac Risk Factors: Family History - CAD and IDDM Type 1  Symptoms:  Chest Pain and Dizziness   Nuclear Pre-Procedure Caffeine/Decaff Intake:  None NPO After: 8:00pm   Lungs:  clear O2 Sat: 95% on room air. IV 0.9% NS with Angio Cath:  20g  IV Site: R Forearm  IV Started by:  Annye Rusk, CNMT  Chest Size (in):  48 Cup Size: D  Height: 5\' 8"  (1.727 m)  Weight:  248 lb (112.492 kg)  BMI:  Body mass index is 37.72 kg/(m^2). Tech Comments:  No insulin today; pt took  x1 Midodrine tablet prior to [place] test due to BP findings. Bp maintained without changes during infusion and recovery .    Nuclear Med Study 1 or 2 day study: 2 day  Stress Test Type:  Carlton Adam  Reading MD: N/A  Order Authorizing Provider:  Lendell Caprice, MD  Resting Radionuclide: Technetium 22m Sestamibi  Resting Radionuclide Dose: 33.0 mCi  09/28/13  Stress Radionuclide:  Technetium 74m Sestamibi  Stress Radionuclide Dose: 33.0 mCi   09/07/13           Stress Protocol Rest HR: 64 Stress HR: 72  Rest BP: 92/50 Stress BP: 88/52  Exercise Time (min): n/a METS: n/a           Dose of Adenosine (mg):  n/a Dose of Lexiscan: 0.4 mg  Dose of Atropine (mg): n/a Dose of Dobutamine: n/a mcg/kg/min (at max HR)  Stress Test Technologist: Ileene Hutchinson, EMT-P  Nuclear Technologist:  Charlton Amor, CNMT     Rest Procedure:  Myocardial perfusion imaging was performed at rest 45 minutes following the intravenous administration of Technetium 39m Sestamibi. Rest ECG: NSR - Normal EKG  Stress Procedure:  The patient  received IV Lexiscan 0.4 mg over 15-seconds.  Technetium 31m Sestamibi injected at 30-seconds.  Quantitative spect images were obtained after a 45 minute delay. Stress ECG: No significant change from baseline ECG  QPS Raw Data Images:  There is a breast shadow that accounts for the anterior attenuation. Stress Images:  There is a medium sized, mild area of attenuation of the mid and distal anterior wall.   Rest Images:  There is a medium sized, mild area of attenuation of the mid and distal anterior wall.  Subtraction (SDS):  No evidence of ischemia. Transient Ischemic Dilatation (Normal <1.22):  1.34 Lung/Heart Ratio (Normal <0.45):  0.40  Quantitative Gated Spect Images QGS EDV:  128 ml QGS ESV:  55 ml  Impression Exercise Capacity:  Lexiscan with no exercise. BP Response:  Normal blood pressure response. Clinical Symptoms:  No significant symptoms noted. ECG Impression:  No significant ST segment change suggestive of ischemia. Comparison with Prior Nuclear Study: No images to compare  Overall Impression:  Low risk stress nuclear study .  There is mild attenuation of the anterior wall that is mostl likely due to breast attenuation.  The LV contractility is normal. .  LV Ejection Fraction: 57%.  LV Wall Motion:  NL LV Function; NL Wall Motion.  Thayer Headings, Brooke Bonito., MD, [facility] 09/28/2013, 4:59 PM Office - [phone] Pager [phone]

## 2013-09-14 ENCOUNTER — Encounter (HOSPITAL_COMMUNITY): Payer: BC Managed Care – PPO

## 2013-09-21 ENCOUNTER — Encounter: Payer: Self-pay | Admitting: Family Medicine

## 2013-09-21 ENCOUNTER — Ambulatory Visit (INDEPENDENT_AMBULATORY_CARE_PROVIDER_SITE_OTHER): Payer: Medicare Other | Admitting: Family Medicine

## 2013-09-21 VITALS — BP 136/74 | HR 69 | Temp 97.6°F

## 2013-09-21 DIAGNOSIS — Z0000 Encounter for general adult medical examination without abnormal findings: Secondary | ICD-10-CM

## 2013-09-21 NOTE — Assessment & Plan Note (Addendum)
I have not been monitoring her DM.  I did feel she is safe to drive with the additional provisions made in her vehicle, i.e, hand controls, but I could not fill out visual acuity or other parts of the form.  Form faxed and was returned to pt stating that I do feel she is ok to drive with regular rechecks.

## 2013-09-21 NOTE — Progress Notes (Signed)
48 year old woman with a history of end-stage renal disease, on dialysis 3 times per week, CAD, sleep apnea, currently not on CPAP, diabetes that is poorly controlled, anemia, depression here to have forms filled out for [facility].   I have not seen [name] in almost a year.  She did have to have two partial toe amputations since I last saw her.  She has been following regularly with her nephrologist. Per pt, her a1c this month was 6.8. Denies any episodes of hypoglyemia.  Has been disabled due to ESRD and diabetic neuropathy for years. Wheelchair bound, non ambulatory.  Saw me in 2012 to fill out DMV forms. At that time, I filled out he areas of the form that I could. I have not been monitoring her DM.  I did feel she is safe to drive with the additional provisions made in her vehicle, i.e, hand controls, but I could not fill out visual acuity or other parts of the form.  Form was returned to pt stating that I do feel she is ok to drive with regular rechecks.   Patient Active Problem List   Diagnosis Date Noted  . ESRD (end stage renal disease) 08/15/2013  . Family history of ischemic heart disease 08/15/2013  . Chest pain, unspecified 08/15/2013  . Nonspecific abnormal electrocardiogram (ECG) (EKG) 01/24/2013  . General medical exam 03/12/2011  . UNSPECIFIED HYPOTENSION 03/27/2010  . DIZZINESS 03/27/2010  . ABSCESS 08/07/2009  . DIABETES MELLITUS, TYPE II 07/24/2009  . DIABETIC  RETINOPATHY 07/24/2009  . DIABETIC PERIPHERAL NEUROPATHY 07/24/2009  . MORBID OBESITY 07/24/2009  . ADJ DISORDER WITH MIXED ANXIETY & DEPRESSED MOOD 07/24/2009  . PULMONARY NODULE, SOLITARY 07/24/2009  . RENAL FAILURE 07/24/2009  . SECONDARY HYPERPARATHYROIDISM 07/24/2009   Past Medical History  Diagnosis Date  . Diabetes mellitus     type 2  . Renal failure     end stage, on dialysis since 05/2002  . Idiopathic parathyroidism     secondary, hyper  . Lung nodule     left lung   Past Surgical  History  Procedure Laterality Date  . Cataract extraction    . Toe amputation      right foot  . Fistula surgery      multiple graft   History  Substance Use Topics  . Smoking status: Never Smoker   . Smokeless tobacco: Not on file  . Alcohol Use: No   Family History  Problem Relation Age of Onset  . Hypertension Mother   . Hypertension Father    Allergies  Allergen Reactions  . Bupropion Hcl     REACTION: Bites nails  . Morphine     REACTION: ?   Current Outpatient Prescriptions on File Prior to Visit  Medication Sig Dispense Refill  . B-D ULTRAFINE III SHORT PEN 31G X 8 MM MISC       . cinacalcet (SENSIPAR) 60 MG tablet Take 60 mg by mouth daily.        . clopidogrel (PLAVIX) 75 MG tablet Take 75 mg by mouth daily.      Marland Kitchen doxycycline (VIBRAMYCIN) 100 MG capsule Take 100 mg by mouth daily.       Marland Kitchen FOSRENOL 1000 MG chewable tablet Chew 1,000 mg by mouth 3 (three) times daily with meals.       . gabapentin (NEURONTIN) 100 MG tablet Take 100 mg by mouth 2 (two) times daily. 200 mg  in the a.m. And 100 mg  at night      .  glucose blood (ONETOUCH VERIO) test strip 1 each by Other route 2 (two) times daily. And lancets 2/day 250.01  100 each  12  . insulin aspart (NOVOLOG FLEXPEN) 100 UNIT/ML SOPN FlexPen Inject 25 Units into the skin 3 (three) times daily with meals. And pen needles 3/day  10 pen  11  . LANTUS SOLOSTAR 100 UNIT/ML SOPN Take 50 mg by mouth Nightly.       . midodrine (PROAMATINE) 10 MG tablet Take 10 mg by mouth 3 (three) times daily. Take two 10 mg tabs 3 times a day      . Multiple Vitamin (RENAL MULTIVITAMIN/ZINC) TABS Take 1 tablet by mouth daily.         No current facility-administered medications on file prior to visit.   The PMH, PSH, Social History, Family History, Medications, and allergies have been reviewed in [facility], and have been updated if relevant.     The PMH, PSH, Social History, Family History, Medications, and allergies have been reviewed in  [facility], and have been updated if relevant.     ROS: See HPI     Physical Exam BP 136/74  Pulse 69  Temp(Src) 97.6 F (36.4 C) (Oral)  SpO2 96%    General:  Well developed, well nourished, in no acute distress. Head:  normocephalic and atraumatic Neck:  Neck supple, no JVD. No masses, thyromegaly or abnormal cervical nodes. Lungs:  Clear bilaterally to auscultation and percussion. Heart:  Non-displaced PMI, chest non-tender; regular rate and rhythm, S1, S2 with I-II SEM RSB, no rubs or gallops. Carotid upstroke normal, no bruit.  Pedals normal pulses. No edema, no varicosities. Abdomen:  Bowel sounds positive; abdomen soft and non-tender without masses Msk:  Back normal, normal gait. Muscle strength and tone normal. Pulses:  pulses normal in all 4 extremities Extremities:  No clubbing or cyanosis. Neurologic:  Alert and oriented x 3. Skin:  Intact without lesions or rashes. Psych:  Normal affect, in wheelchair.

## 2013-09-21 NOTE — Progress Notes (Signed)
Pre-visit discussion using our clinic review tool. No additional management support is needed unless otherwise documented below in the visit note.  

## 2013-09-26 ENCOUNTER — Ambulatory Visit: Payer: Self-pay

## 2013-09-28 ENCOUNTER — Ambulatory Visit (HOSPITAL_COMMUNITY): Payer: Medicare Other | Attending: Interventional Cardiology

## 2013-09-28 DIAGNOSIS — R0989 Other specified symptoms and signs involving the circulatory and respiratory systems: Secondary | ICD-10-CM

## 2013-09-28 MED ORDER — TECHNETIUM TC 99M SESTAMIBI GENERIC - CARDIOLITE
30.0000 | Freq: Once | INTRAVENOUS | Status: AC | PRN
  Administered 2013-09-28: 30 via INTRAVENOUS

## 2013-10-05 ENCOUNTER — Encounter: Payer: Self-pay | Admitting: Surgery

## 2013-10-09 LAB — WOUND AEROBIC CULTURE

## 2013-10-29 ENCOUNTER — Encounter: Payer: Self-pay | Admitting: Surgery

## 2013-11-13 ENCOUNTER — Encounter: Payer: Self-pay | Admitting: Surgery

## 2013-11-23 ENCOUNTER — Ambulatory Visit (INDEPENDENT_AMBULATORY_CARE_PROVIDER_SITE_OTHER): Payer: Medicare Other | Admitting: Podiatry

## 2013-11-23 ENCOUNTER — Encounter: Payer: Self-pay | Admitting: Podiatry

## 2013-11-23 VITALS — BP 105/79 | HR 61 | Resp 16 | Ht 68.0 in

## 2013-11-23 DIAGNOSIS — M79609 Pain in unspecified limb: Secondary | ICD-10-CM

## 2013-11-23 DIAGNOSIS — E1059 Type 1 diabetes mellitus with other circulatory complications: Secondary | ICD-10-CM

## 2013-11-23 DIAGNOSIS — M79673 Pain in unspecified foot: Secondary | ICD-10-CM

## 2013-11-23 DIAGNOSIS — E1049 Type 1 diabetes mellitus with other diabetic neurological complication: Secondary | ICD-10-CM

## 2013-11-23 DIAGNOSIS — B351 Tinea unguium: Secondary | ICD-10-CM

## 2013-11-23 NOTE — Progress Notes (Signed)
   Subjective:    Patient ID: [name], female    DOB: [birth year], 48 y.o.   MRN: [id number]  HPI Comments: i need my toenails trimmed. They do not hurt because i dont feel anything. i was seeing dr cline to get my toenails trimmed.      Review of Systems     Objective:   Physical Exam: I have reviewed her past medical history medications allergies surgeries and social history. Pulses are nonpalpable bilateral. And she is under the care of vascular surgery. Neurologic sensorium is no elicitable as is deep tendon reflexes. Muscle strength is plus for over 4 dorsiflexors plantar flexors inverters and evertors. Cannot elicit intrinsic musculature. Orthopedic evaluation demonstrates rectus foot type with a history of amputations to toes 1 into of the right foot and 2 and 3 of the left foot. Cutaneous evaluation demonstrates swollen edematous erythematous feet with dry scales and plaques and interdigital debris. Her nails are thick yellow dystrophic and clinically mycotic.        Assessment & Plan:  Assessment: Severe diabetic peripheral neuropathy and angiopathy. History of amputations. Onychomycosis.  Plan: Debridement of nails bilaterally. Discussed appropriate foot and skin care.

## 2013-11-29 ENCOUNTER — Encounter: Payer: Self-pay | Admitting: Surgery

## 2013-12-14 ENCOUNTER — Ambulatory Visit: Payer: BC Managed Care – PPO | Admitting: Internal Medicine

## 2013-12-15 ENCOUNTER — Encounter: Payer: Self-pay | Admitting: Gastroenterology

## 2013-12-29 ENCOUNTER — Encounter: Payer: Self-pay | Admitting: Surgery

## 2014-01-02 ENCOUNTER — Ambulatory Visit (INDEPENDENT_AMBULATORY_CARE_PROVIDER_SITE_OTHER): Payer: Medicare Other | Admitting: Internal Medicine

## 2014-01-02 ENCOUNTER — Encounter: Payer: Self-pay | Admitting: Internal Medicine

## 2014-01-02 VITALS — BP 100/58 | HR 68

## 2014-01-02 DIAGNOSIS — K5641 Fecal impaction: Secondary | ICD-10-CM

## 2014-01-02 NOTE — Patient Instructions (Signed)
Dr Carlean Purl recommends that you complete a bowel purge (to clean out your bowels). Please do the following: Purchase a bottle of Miralax over the counter as well as a box of 5 mg dulcolax tablets. Take 4 dulcolax tablets. Wait 1 hour. You will then drink 4 capfuls of Miralax mixed in an adequate amount of water/juice/gatorade (you may choose which of these liquids to drink) over the next 2-3 hours. You should expect results within 1 to 6 hours after completing the bowel purge.   Take Miralax daily.   Call us back with any problems.   I appreciate the opportunity to care for you.

## 2014-01-02 NOTE — Progress Notes (Signed)
Subjective:    Patient ID: [name], female    DOB: 05-05-1966, 48 y.o.   MRN: [id number]  HPI The patient is a 48 year old woman with chronic renal failure on dialysis. She is here because of diarrhea problems. She tells me over time she was initially having trouble with constipation - on PhosLo. Then start a new prostate binder and diarrhea became a problem. Now on Renvella -and she has semi-urge and soft stools after eating - + at night also. Also feels urge to defecate on a constant basis. She does not walk much really if at all, using a scooter to ambulate.  Allergies  Allergen Reactions  . Amitriptyline Other (See Comments)    sleepy  . Benadryl [Diphenhydramine Hcl (Sleep)] Other (See Comments)    LBP  . Bupropion Hcl     REACTION: Bites nails  . Iron   . Morphine     REACTION: ?  Richardo Hanks [Oxycodone-Acetaminophen] Nausea And Vomiting  . Tylenol [Acetaminophen] Other (See Comments)    LBP  . Wellbutrin [Bupropion] Other (See Comments)    Bites nails    Outpatient Prescriptions Prior to Visit  Medication Sig Dispense Refill  . B-D ULTRAFINE III SHORT PEN 31G X 8 MM MISC       . cinacalcet (SENSIPAR) 60 MG tablet Take 60 mg by mouth daily.        . clopidogrel (PLAVIX) 75 MG tablet Take 75 mg by mouth daily.      Marland Kitchen doxycycline (VIBRAMYCIN) 100 MG capsule Take 100 mg by mouth daily.       Marland Kitchen gabapentin (NEURONTIN) 100 MG tablet Take 100 mg by mouth 2 (two) times daily. 200 mg  in the a.m. And 100 mg  at night      . glucose blood (ONETOUCH VERIO) test strip 1 each by Other route 2 (two) times daily. And lancets 2/day 250.01  100 each  12  . insulin aspart (NOVOLOG FLEXPEN) 100 UNIT/ML SOPN FlexPen Inject 25 Units into the skin 3 (three) times daily with meals. And pen needles 3/day  10 pen  11  . LANTUS SOLOSTAR 100 UNIT/ML SOPN Take 50 mg by mouth Nightly.       . midodrine (PROAMATINE) 10 MG tablet Take 10 mg by mouth 3 (three) times daily. Take two 10  mg tabs 3 times a day      . Multiple Vitamin (RENAL MULTIVITAMIN/ZINC) TABS Take 1 tablet by mouth daily.        . Sevelamer Carbonate (RENVELA PO) Take by mouth daily.      Marland Kitchen VANCOMYCIN HCL IV Inject into the vein. Every other day      . FOSRENOL 1000 MG chewable tablet Chew 1,000 mg by mouth 3 (three) times daily with meals.        No facility-administered medications prior to visit.   Past Medical History  Diagnosis Date  . Diabetes mellitus     type 2  . ESRD (end stage renal disease)     end stage, on dialysis since 05/2002  . Idiopathic parathyroidism     secondary, hyper  . Lung nodule     left lung  . Sinus problem   . Neuropathy   . Hyperkalemia    Past Surgical History  Procedure Laterality Date  . Cataract extraction    . Toe amputation Right     foot  . Fistula surgery  multiple graft   History   Social History  . Marital Status: Married    Spouse Name: N/A    Number of Children: N/A  . Years of Education: N/A   Occupational History  . Disabled    Social History Main Topics  . Smoking status: Never Smoker   . Smokeless tobacco: Never Used  . Alcohol Use: Yes     Comment: occasionally  . Drug Use: No  . Sexual Activity: None   Other Topics Concern  . None   Social History Narrative   Lives with husband in [place].  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   Family History  Problem Relation Age of Onset  . Hypertension Mother   . Liver disease Mother   . Hypertension Father   . Colon polyps Father   . Heart disease Father   . Diabetes Maternal Grandmother   . Diabetes Maternal Grandfather   . Diabetes Paternal Grandmother   . Diabetes Paternal Grandfather        Review of Systems + vaginal bleeding - sees [facility]- fibroids, ambulates using a scooter or wheelchair. She has significant lower extremity neuropathy. Dialyzes Monday Wednesday Friday    Objective:   Physical Exam General:  Obese  and chronically ill in scooter Eyes:  anicteric ENT:   Mouth and posterior pharynx free of lesions.  Neck:   supple w/o thyromegaly or mass.  Lungs: Clear to auscultation bilaterally. Heart:  S1S2, no rubs, murmurs, gallops. Abdomen:  soft, non-tender, no hepatosplenomegaly, hernia, or mass and BS+.  Rectal:   [name], CMA present  Normal anoderm but a few small pieces of stool in anal area, there is a soft brown impaction, no mass  Extremities:   no edema + left thigh fistula with thrill, right arm fistula Skin   excoriations RUE, gray color throughout Neuro:  A&O x 3.  Psych:  appropriate mood and  Affect.   Data Reviewed: Dialysis notes/labs      Assessment & Plan:   1. Fecal impaction in rectum    I think this is causing her symptoms. I think she needs more regular laxative use.  MiraLax purge and daily MiraLax  Call back prn  I appreciate the opportunity to care for this patient.   CC: Tonya Norris, MD Shawnie Dapper.D.

## 2014-01-03 DIAGNOSIS — K5641 Fecal impaction: Secondary | ICD-10-CM | POA: Insufficient documentation

## 2014-01-09 ENCOUNTER — Ambulatory Visit: Payer: Self-pay | Admitting: Vascular Surgery

## 2014-01-09 LAB — HCG, QUANTITATIVE, PREGNANCY: Beta Hcg, Quant.: 4 m[IU]/mL — ABNORMAL HIGH

## 2014-01-09 LAB — POTASSIUM: POTASSIUM: 3.9 mmol/L (ref 3.5–5.1)

## 2014-01-11 ENCOUNTER — Encounter: Payer: Self-pay | Admitting: Nephrology

## 2014-01-12 ENCOUNTER — Other Ambulatory Visit: Payer: Self-pay

## 2014-01-12 ENCOUNTER — Telehealth: Payer: Self-pay | Admitting: Internal Medicine

## 2014-01-12 DIAGNOSIS — R194 Change in bowel habit: Secondary | ICD-10-CM

## 2014-01-12 NOTE — Telephone Encounter (Signed)
I will assume she stopped regular MiraLax She will need a colonoscopy t investigate change in bowel habits Will need to be at hospital  Moviprep, MAC or moderate Hospital week vs early PM 5/22 or 6/3 to follow the one on that day

## 2014-01-12 NOTE — Telephone Encounter (Signed)
Patient is scheduled for 02/15/14 10:30 at [facility] with MAC

## 2014-01-12 NOTE — Telephone Encounter (Signed)
Left message for patient to call back  

## 2014-01-12 NOTE — Telephone Encounter (Signed)
Patient reports that she is having diarrhea after each meal since Miralax purge.  She has been using imodium.  Please advise.

## 2014-01-16 ENCOUNTER — Other Ambulatory Visit: Payer: Self-pay

## 2014-01-16 LAB — SEDIMENTATION RATE: Erythrocyte Sed Rate: 3 mm/hr (ref 0–20)

## 2014-01-25 ENCOUNTER — Encounter (HOSPITAL_COMMUNITY): Payer: Self-pay | Admitting: Pharmacy Technician

## 2014-01-29 ENCOUNTER — Encounter: Payer: Self-pay | Admitting: Nephrology

## 2014-02-01 ENCOUNTER — Ambulatory Visit (AMBULATORY_SURGERY_CENTER): Payer: Medicare Other | Admitting: *Deleted

## 2014-02-01 ENCOUNTER — Telehealth: Payer: Self-pay | Admitting: *Deleted

## 2014-02-01 VITALS — Ht 68.0 in | Wt 247.8 lb

## 2014-02-01 DIAGNOSIS — R194 Change in bowel habit: Secondary | ICD-10-CM

## 2014-02-01 DIAGNOSIS — R198 Other specified symptoms and signs involving the digestive system and abdomen: Secondary | ICD-10-CM

## 2014-02-01 MED ORDER — MOVIPREP 100 G PO SOLR: 1.0000 | Freq: Once | ORAL | Status: DC

## 2014-02-01 NOTE — Progress Notes (Signed)
No egg or soy allergy. No anesthesia problems.  Pt requests propofol. Pt does not want to wake up during procedure.

## 2014-02-01 NOTE — Telephone Encounter (Signed)
This should not be a problem for her - stick with MoviPrep  Use  All the time in renal failure

## 2014-02-01 NOTE — Telephone Encounter (Signed)
Pt was seen in pre-visit today, pt scheduled for colonoscopy on 02/15/14 at [facility], pt is on dialysis and is concerned about amount of fluid she has to drink for the prep and the day before. Pt is on a 32 oz. Fluid restriction. She states you told her she would get rid of most of the fluid when she preps but pt is still concerned about the amount she has to drink. Advised pt that she would loose most of the fluid volume through the prep but would send a note to see if you wanted to change anything with her prep. Pt was given Movi prep. Would you like to change anything with her instructions?-adm

## 2014-02-08 ENCOUNTER — Encounter (HOSPITAL_COMMUNITY): Payer: Self-pay | Admitting: *Deleted

## 2014-02-09 ENCOUNTER — Other Ambulatory Visit: Payer: Self-pay | Admitting: Endocrinology

## 2014-02-15 ENCOUNTER — Telehealth: Payer: Self-pay | Admitting: Gastroenterology

## 2014-02-15 ENCOUNTER — Ambulatory Visit (HOSPITAL_COMMUNITY): Payer: Medicare Other | Admitting: *Deleted

## 2014-02-15 ENCOUNTER — Encounter (HOSPITAL_COMMUNITY)
Admission: RE | Disposition: A | Discharge status: Home / Self Care | Payer: Self-pay | Source: Ambulatory Visit | Attending: Internal Medicine

## 2014-02-15 ENCOUNTER — Ambulatory Visit (HOSPITAL_COMMUNITY)
Admission: RE | Admit: 2014-02-15 | Discharge: 2014-02-15 | Disposition: A | Discharge status: Home / Self Care | Payer: Medicare Other | Source: Ambulatory Visit | Attending: Internal Medicine | Admitting: Internal Medicine

## 2014-02-15 ENCOUNTER — Encounter (HOSPITAL_COMMUNITY): Payer: Self-pay | Admitting: *Deleted

## 2014-02-15 ENCOUNTER — Encounter (HOSPITAL_COMMUNITY): Payer: Medicare Other | Admitting: *Deleted

## 2014-02-15 DIAGNOSIS — R269 Unspecified abnormalities of gait and mobility: Secondary | ICD-10-CM | POA: Insufficient documentation

## 2014-02-15 DIAGNOSIS — K5900 Constipation, unspecified: Secondary | ICD-10-CM | POA: Insufficient documentation

## 2014-02-15 DIAGNOSIS — G609 Hereditary and idiopathic neuropathy, unspecified: Secondary | ICD-10-CM | POA: Insufficient documentation

## 2014-02-15 DIAGNOSIS — R194 Change in bowel habit: Secondary | ICD-10-CM

## 2014-02-15 DIAGNOSIS — Z860100 Personal history of colon polyps, unspecified: Secondary | ICD-10-CM

## 2014-02-15 DIAGNOSIS — Z79899 Other long term (current) drug therapy: Secondary | ICD-10-CM | POA: Insufficient documentation

## 2014-02-15 DIAGNOSIS — E119 Type 2 diabetes mellitus without complications: Secondary | ICD-10-CM | POA: Insufficient documentation

## 2014-02-15 DIAGNOSIS — Z7982 Long term (current) use of aspirin: Secondary | ICD-10-CM | POA: Insufficient documentation

## 2014-02-15 DIAGNOSIS — K219 Gastro-esophageal reflux disease without esophagitis: Secondary | ICD-10-CM | POA: Insufficient documentation

## 2014-02-15 DIAGNOSIS — R198 Other specified symptoms and signs involving the digestive system and abdomen: Secondary | ICD-10-CM

## 2014-02-15 DIAGNOSIS — Z794 Long term (current) use of insulin: Secondary | ICD-10-CM | POA: Insufficient documentation

## 2014-02-15 DIAGNOSIS — Z888 Allergy status to other drugs, medicaments and biological substances status: Secondary | ICD-10-CM | POA: Insufficient documentation

## 2014-02-15 DIAGNOSIS — N186 End stage renal disease: Secondary | ICD-10-CM | POA: Insufficient documentation

## 2014-02-15 DIAGNOSIS — Z992 Dependence on renal dialysis: Secondary | ICD-10-CM | POA: Insufficient documentation

## 2014-02-15 DIAGNOSIS — R197 Diarrhea, unspecified: Secondary | ICD-10-CM | POA: Insufficient documentation

## 2014-02-15 DIAGNOSIS — D126 Benign neoplasm of colon, unspecified: Secondary | ICD-10-CM | POA: Insufficient documentation

## 2014-02-15 HISTORY — DX: Other complications of anesthesia, initial encounter: T88.59XA

## 2014-02-15 HISTORY — DX: Adverse effect of unspecified anesthetic, initial encounter: T41.45XA

## 2014-02-15 HISTORY — DX: Personal history of colon polyps, unspecified: Z86.0100

## 2014-02-15 HISTORY — DX: Arteriovenous fistula, acquired: I77.0

## 2014-02-15 HISTORY — PX: COLONOSCOPY WITH PROPOFOL: SHX5780

## 2014-02-15 HISTORY — DX: Personal history of colonic polyps: Z86.010

## 2014-02-15 HISTORY — DX: Unspecified abnormalities of gait and mobility: R26.9

## 2014-02-15 HISTORY — DX: Other disorders of pituitary gland: E23.6

## 2014-02-15 LAB — POCT I-STAT 4, (NA,K, GLUC, HGB,HCT)
Glucose, Bld: 147 mg/dL — ABNORMAL HIGH (ref 70–99)
Glucose, Bld: 152 mg/dL — ABNORMAL HIGH (ref 70–99)
HCT: 59 % — ABNORMAL HIGH (ref 36.0–46.0)
HEMATOCRIT: 60 % — AB (ref 36.0–46.0)
HEMOGLOBIN: 20.1 g/dL — AB (ref 12.0–15.0)
Hemoglobin: 20.4 g/dL — ABNORMAL HIGH (ref 12.0–15.0)
Potassium: 6.9 mEq/L (ref 3.7–5.3)
Potassium: >9 mEq/L (ref 3.7–5.3)
SODIUM: 133 meq/L — AB (ref 137–147)
Sodium: 137 mEq/L (ref 137–147)

## 2014-02-15 LAB — POTASSIUM: Potassium: 5.4 mEq/L — ABNORMAL HIGH (ref 3.7–5.3)

## 2014-02-15 SURGERY — COLONOSCOPY WITH PROPOFOL
Anesthesia: Monitor Anesthesia Care

## 2014-02-15 MED ORDER — MIDODRINE HCL 5 MG PO TABS
20.0000 mg | ORAL_TABLET | Freq: Once | ORAL | Status: AC
  Administered 2014-02-15: 20 mg via ORAL
  Filled 2014-02-15: qty 4

## 2014-02-15 MED ORDER — LACTATED RINGERS IV SOLN
INTRAVENOUS | Status: DC
  Administered 2014-02-15: 1000 mL via INTRAVENOUS

## 2014-02-15 MED ORDER — PROPOFOL INFUSION 10 MG/ML OPTIME
INTRAVENOUS | Status: DC | PRN
  Administered 2014-02-15: 120 ug/kg/min via INTRAVENOUS

## 2014-02-15 MED ORDER — LIDOCAINE HCL (CARDIAC) 20 MG/ML IV SOLN
INTRAVENOUS | Status: AC
  Filled 2014-02-15: qty 5

## 2014-02-15 MED ORDER — PHENYLEPHRINE HCL 10 MG/ML IJ SOLN
INTRAMUSCULAR | Status: DC | PRN
  Administered 2014-02-15: 80 ug via INTRAVENOUS
  Administered 2014-02-15 (×2): 120 ug via INTRAVENOUS

## 2014-02-15 MED ORDER — SODIUM CHLORIDE 0.9 % IV SOLN
INTRAVENOUS | Status: DC
  Administered 2014-02-15: 12:00:00 via INTRAVENOUS

## 2014-02-15 MED ORDER — METOCLOPRAMIDE HCL 5 MG/ML IJ SOLN
INTRAMUSCULAR | Status: AC
  Filled 2014-02-15: qty 2

## 2014-02-15 MED ORDER — LIDOCAINE HCL (CARDIAC) 20 MG/ML IV SOLN
INTRAVENOUS | Status: DC | PRN
  Administered 2014-02-15 (×2): 50 mg via INTRAVENOUS

## 2014-02-15 MED ORDER — GLYCOPYRROLATE 0.2 MG/ML IJ SOLN
INTRAMUSCULAR | Status: AC
  Filled 2014-02-15: qty 1

## 2014-02-15 MED ORDER — PROPOFOL 10 MG/ML IV BOLUS
INTRAVENOUS | Status: AC
  Filled 2014-02-15: qty 20

## 2014-02-15 MED ORDER — PROPOFOL 10 MG/ML IV BOLUS
INTRAVENOUS | Status: DC | PRN
  Administered 2014-02-15: 30 mg via INTRAVENOUS

## 2014-02-15 MED ORDER — METOCLOPRAMIDE HCL 5 MG/ML IJ SOLN
INTRAMUSCULAR | Status: DC | PRN
  Administered 2014-02-15: 10 mg via INTRAVENOUS

## 2014-02-15 MED ORDER — EPHEDRINE SULFATE 50 MG/ML IJ SOLN
INTRAMUSCULAR | Status: DC | PRN
  Administered 2014-02-15: 5 mg via INTRAVENOUS
  Administered 2014-02-15 (×2): 10 mg via INTRAVENOUS

## 2014-02-15 MED ORDER — MIDAZOLAM HCL 2 MG/2ML IJ SOLN
INTRAMUSCULAR | Status: AC
  Filled 2014-02-15: qty 2

## 2014-02-15 MED ORDER — ESMOLOL HCL 10 MG/ML IV SOLN
INTRAVENOUS | Status: AC
  Filled 2014-02-15: qty 10

## 2014-02-15 SURGICAL SUPPLY — 22 items

## 2014-02-15 NOTE — Anesthesia Preprocedure Evaluation (Addendum)
Anesthesia Evaluation  Patient identified by MRN, date of birth, ID band Patient awake    Reviewed: Allergy & Precautions, H&P , NPO status , Patient's Chart, lab work & pertinent test results  History of Anesthesia Complications Negative for: history of anesthetic complications  Airway Mallampati: II TM Distance: >3 FB Neck ROM: Full    Dental  (+) Dental Advisory Given   Pulmonary neg pulmonary ROS,  breath sounds clear to auscultation        Cardiovascular negative cardio ROS  Rhythm:Regular Rate:Normal     Neuro/Psych PSYCHIATRIC DISORDERS negative neurological ROS     GI/Hepatic negative GI ROS, Neg liver ROS, GERD-  ,  Endo/Other  negative endocrine ROSdiabetes, Type 2, Insulin Dependent  Renal/GU ESRFRenal disease     Musculoskeletal negative musculoskeletal ROS (+)   Abdominal   Peds  Hematology negative hematology ROS (+)   Anesthesia Other Findings   Reproductive/Obstetrics negative OB ROS                         Anesthesia Physical Anesthesia Plan  ASA: III  Anesthesia Plan: MAC   Post-op Pain Management:    Induction: Intravenous  Airway Management Planned:   Additional Equipment:   Intra-op Plan:   Post-operative Plan:   Informed Consent: I have reviewed the patients History and Physical, chart, labs and discussed the procedure including the risks, benefits and alternatives for the proposed anesthesia with the patient or authorized representative who has indicated his/her understanding and acceptance.   Dental advisory given  Plan Discussed with: CRNA  Anesthesia Plan Comments:        Anesthesia Quick Evaluation

## 2014-02-15 NOTE — Anesthesia Postprocedure Evaluation (Signed)
Anesthesia Post Note  Patient: [name]  Procedure(s) Performed: Procedure(s) (LRB): COLONOSCOPY WITH PROPOFOL (N/A)  Anesthesia type: MAC  Patient location: PACU  Post pain: Pain level controlled  Post assessment: Post-op Vital signs reviewed  Last Vitals: BP 81/44  Pulse 78  Temp(Src) 36.4 C (Oral)  Resp 16  SpO2 100%  Post vital signs: Reviewed  Level of consciousness: awake  Complications: No apparent anesthesia complications

## 2014-02-15 NOTE — H&P (Signed)
[facility] History and Physical   Primary Care Physician:  Arnette Norris, MD   Reason for Procedure:   Change in bowel habits  Plan:    Colonoscopy      The risks and benefits as well as alternatives of endoscopic procedure(s) have been discussed and reviewed. All questions answered. The patient agrees to proceed.      HPI: [name] is a 48 y.o. female with hx of suspected fecal impaction - she took a MiraLax purge and then c/o persistent diarrhe so colonoscopy was scheduled.   Past Medical History  Diagnosis Date  . Diabetes mellitus     type 2  . Idiopathic parathyroidism     secondary, hyper  . Lung nodule     left lung  . Sinus problem   . Neuropathy   . Hyperkalemia   . Allergy     seasonal  . Cataract     surgery  . GERD (gastroesophageal reflux disease)   . Low blood pressure   . Neuromuscular disorder   . Gait difficulty     "uses motor or standard wheelchair" -unsteady on feet.  . Arteriovenous fistula     Rt. upper arm, 02-08-14 now has AV Goretex graft Lt. thigh  . ESRD (end stage renal disease)     end stage, on dialysis since 05/2002. dry wt. 113 kg.M-W-F, [place] [address].  Marland Kitchen Complication of anesthesia     BP runs usually low. Had an issue with "twilight sleep" in past.  . Disorder of neurophysis     cannot walk    Past Surgical History  Procedure Laterality Date  . Cataract extraction Bilateral   . Toe amputation Bilateral     two different surgeries-"multiple toes involved"  . Fistula surgery      multiple graft  . Knee arthroscopy    . Arteriovenous graft placement Left     lt. thigh at present  . Av fistula placement Right     right upper arm  . Breast surgery      removal of  precancerous    Prior to Admission medications   Medication Sig Start Date End Date Taking? Authorizing Provider  aspirin EC 81 MG tablet Take 81 mg by mouth at bedtime.   Yes Historical Provider, MD  cinacalcet (SENSIPAR) 60 MG  tablet Take 60 mg by mouth daily.     Yes Historical Provider, MD  doxycycline (VIBRAMYCIN) 100 MG capsule Take 100 mg by mouth 2 (two) times daily.  08/09/13  Yes Historical Provider, MD  fluconazole (DIFLUCAN) 100 MG tablet Take 100 mg by mouth as needed (For yeast infections cause by doxycycline).  11/05/13  Yes Historical Provider, MD  gabapentin (NEURONTIN) 100 MG tablet Take 100-200 mg by mouth 2 (two) times daily. 200 mg  in the a.m. And 100 mg  at night   Yes Historical Provider, MD  insulin aspart (NOVOLOG FLEXPEN) 100 UNIT/ML SOPN FlexPen Inject 25 Units into the skin 3 (three) times daily with meals. And pen needles 3/day 03/12/13  Yes Renato Shin, MD  LANTUS SOLOSTAR 100 UNIT/ML SOPN Inject 50 mg into the skin at bedtime.  08/09/13  Yes Historical Provider, MD  loperamide (IMODIUM) 2 MG capsule Take 2 mg by mouth as needed for diarrhea or loose stools.   Yes Historical Provider, MD  midodrine (PROAMATINE) 10 MG tablet Take 20 mg by mouth 3 (three) times daily. Take two 10 mg tabs 3 times a day   Yes  Historical Provider, MD  MOVIPREP 100 G SOLR Take 1 kit (200 g total) by mouth once. Name brand only, movi prep as directed, no substitutions. 02/01/14  Yes Gatha Mayer, MD  Multiple Vitamin (RENAL MULTIVITAMIN/ZINC) TABS Take 1 tablet by mouth daily.     Yes Historical Provider, MD  B-D ULTRAFINE III SHORT PEN 31G X 8 MM MISC USE WITH INSULIN THREE TIMES DAILY 02/09/14   Renato Shin, MD  glucose blood (ONETOUCH VERIO) test strip 1 each by Other route 2 (two) times daily. And lancets 2/day 250.01 01/24/13   Renato Shin, MD  sevelamer carbonate (RENVELA) 800 MG tablet Take 4,000 mg by mouth 3 (three) times daily with meals.    Historical Provider, MD    No current facility-administered medications for this encounter.    Allergies as of 01/12/2014 - Review Complete 01/02/2014  Allergen Reaction Noted  . Amitriptyline Other (See Comments) 11/23/2013  . Benadryl [diphenhydramine hcl (sleep)]  Other (See Comments) 11/23/2013  . Bupropion hcl    . Iron  12/15/2013  . Morphine    . Percocet [oxycodone-acetaminophen] Nausea And Vomiting 11/23/2013  . Tylenol [acetaminophen] Other (See Comments) 11/23/2013  . Wellbutrin [bupropion] Other (See Comments) 12/15/2013    Family History  Problem Relation Age of Onset  . Hypertension Mother   . Liver disease Mother   . Hypertension Father   . Colon polyps Father   . Heart disease Father   . Diabetes Maternal Grandmother   . Diabetes Maternal Grandfather   . Diabetes Paternal Grandmother   . Diabetes Paternal Grandfather   . Colon cancer Neg Hx     History   Social History  . Marital Status: Married    Spouse Name: N/A    Number of Children: N/A  . Years of Education: N/A   Occupational History  . Disabled    Social History Main Topics  . Smoking status: Never Smoker   . Smokeless tobacco: Never Used  . Alcohol Use: Yes     Comment: occasionally  . Drug Use: No   Social History Narrative   Lives with husband in [place].  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    Review of Systems: Positive for hemodialysis, limited mobility, fatigue All other ROS negative All other review of systems negative except as mentioned in the HPI.  Physical Exam: Vital signs in last 24 hours: Pulse Rate:  [66] 66 (06/18 0911) Resp:  [16] 16 (06/18 0911) BP: (100)/(59) 100/59 mmHg (06/18 0911) SpO2:  [97 %] 97 % (06/18 0911)   General:   Alert,  Well-developed, chronically ill in NAD Lungs:  Clear throughout to auscultation.   Heart:  Regular rate and rhythm; no murmurs, clicks, rubs,  or gallops. Abdomen:  Soft, mildly tender LLQ and nondistended. Normal bowel sounds.   Neuro/Psych:  Alert and cooperative. Normal mood and affect. A and O x 3 Ext - Right thigh with hematoma at dialysis shunt   '@Deaisa Merida'$  Simonne Maffucci, MD, [facility] [phone] (pager) 02/15/2014 9:52  AM@

## 2014-02-15 NOTE — Telephone Encounter (Signed)
She called at 7:35 this AM, still having a lot of diarrhea despite starting prep at 4am.  She was told to be at [facility] for the procedure but believes she will messing herself on car ride in. She asked if she should take and imodium and I said no.  I let her know that it would be ok to push this back an hour or so but she explained that she would see how it is going, still aim for getting to hospital at [place], [place].  She may be running a bit late.

## 2014-02-15 NOTE — Transfer of Care (Signed)
Immediate Anesthesia Transfer of Care Note  Patient: [name]  Procedure(s) Performed: Procedure(s): COLONOSCOPY WITH PROPOFOL (N/A)  Patient Location: Endo Recovery  Anesthesia Type:MAC  Level of Consciousness: Patient easily awoken, sedated, comfortable, cooperative, following commands, responds to stimulation.   Airway & Oxygen Therapy: Patient spontaneously breathing, ventilating well, oxygen via simple oxygen mask.  Post-op Assessment: Report given to PACU RN, vital signs reviewed and stable, moving all extremities.   Post vital signs: Reviewed and stable.  Complications: No apparent anesthesia complications

## 2014-02-15 NOTE — Discharge Instructions (Signed)
I found and removed 2 polyps - they do NOT look like cancer. They appear benign.  Since you are better the way you are do not do anything different. I do think you had an impaction/blockage that was causing diarrhea and then the MiraLax gave you loose stools after relieving the impaction. Please use MiraLax as needed to treat and prevent constipation.   I will let you know pathology results and when to have another routine colonoscopy by mail.  I appreciate the opportunity to care for you. Gatha Mayer, MD, FACG   YOU HAD AN ENDOSCOPIC PROCEDURE TODAY: Refer to the procedure report and other information in the discharge instructions given to you for any specific questions about what was found during the examination. If this information does not answer your questions, please call Dr. Celesta Aver office at [phone] to clarify.   YOU SHOULD EXPECT: Some feelings of bloating in the abdomen. Passage of more gas than usual. Walking can help get rid of the air that was put into your GI tract during the procedure and reduce the bloating. If you had a lower endoscopy (such as a colonoscopy or flexible sigmoidoscopy) you may notice spotting of blood in your stool or on the toilet paper. Some abdominal soreness may be present for a day or two, also.  DIET: Your first meal following the procedure should be a light meal and then it is ok to progress to your normal diet. A half-sandwich or bowl of soup is an example of a good first meal. Heavy or fried foods are harder to digest and may make you feel nauseous or bloated. Drink plenty of fluids but you should avoid alcoholic beverages for 24 hours.   ACTIVITY: Your care partner should take you home directly after the procedure. You should plan to take it easy, moving slowly for the rest of the day. You can resume normal activity the day after the procedure however YOU SHOULD NOT DRIVE, use power tools, machinery or perform tasks that involve climbing or  major physical exertion for 24 hours (because of the sedation medicines used during the test).   SYMPTOMS TO REPORT IMMEDIATELY: A gastroenterologist can be reached at any hour. Please call [phone]  for any of the following symptoms:  Following lower endoscopy (colonoscopy, flexible sigmoidoscopy) Excessive amounts of blood in the stool  Significant tenderness, worsening of abdominal pains  Swelling of the abdomen that is new, acute  Fever of 100 or higher  Black, tarry-looking or red, bloody stools  FOLLOW UP:  If any biopsies were taken you will be contacted by phone or by letter within the next 1-3 weeks. Call [phone]  if you have not heard about the biopsies in 3 weeks.  Please also call with any specific questions about appointments or follow up tests.

## 2014-02-16 ENCOUNTER — Encounter (HOSPITAL_COMMUNITY): Payer: Self-pay | Admitting: Internal Medicine

## 2014-02-16 NOTE — Op Note (Signed)
[facility] [place], [id number]   COLONOSCOPY PROCEDURE REPORT  PATIENT: [name], [name]  MR#: [id number] BIRTHDATE: [birth year] , 48  yrs. old GENDER: Female ENDOSCOPIST: Gatha Mayer, MD, [facility] PROCEDURE DATE:  02/15/2014 PROCEDURE:   Colonoscopy with snare polypectomy First Screening Colonoscopy - Avg.  risk and is 50 yrs.  old or older - No.  Prior Negative Screening - Now for repeat screening. N/A  History of Adenoma - Now for follow-up colonoscopy & has been > or = to 3 yrs.  N/A  Polyps Removed Today? Yes. ASA CLASS:   Class III INDICATIONS:Change in bowel habits. MEDICATIONS: See Anesthesia Report.  DESCRIPTION OF PROCEDURE:   After the risks benefits and alternatives of the procedure were thoroughly explained, informed consent was obtained.  A digital rectal exam revealed no abnormalities of the rectum.   The Pentax Adult Colonscope [id number] endoscope was introduced through the anus and advanced to the cecum, which was identified by both the appendix and ileocecal valve. No adverse events experienced.   The quality of the prep was good, using MoviPrep  The instrument was then slowly withdrawn as the colon was fully examined.      COLON FINDINGS: Two sessile polyps measuring 5 and 7 mm in size were found in the transverse colon.  Othersise normal colon and rectum. Retroflexed views revealed no abnormalities. The time to cecum= in RN notes.  Withdrawal time= in RN notes    .  The scope was withdrawn and the procedure completed. COMPLICATIONS: There were no complications.  ENDOSCOPIC IMPRESSION: Two sessile polyps measuring 5 and 7 mm in size were found in the transverse colon - remainder of colon normal  RECOMMENDATIONS: Timing of repeat colonoscopy will be determined by pathology findings. she has indicated she would not want to have another colonoscopy Constipation and diarrhea both resolved (she probably had an impaction) -  to use MiraLax prn to avoid diarrhea.  eSigned:  Gatha Mayer, MD, [facility] 02/16/2014 10:54 AM   cc: Arnette Norris MD

## 2014-02-20 ENCOUNTER — Encounter: Payer: Self-pay | Admitting: Internal Medicine

## 2014-02-20 NOTE — Progress Notes (Signed)
Quick Note:  5 and 7 mm tubular adenomas - repeat colonoscopy 2020 ______

## 2014-02-28 ENCOUNTER — Encounter: Payer: Self-pay | Admitting: Nephrology

## 2014-03-01 ENCOUNTER — Ambulatory Visit: Payer: Medicare Other | Admitting: Family Medicine

## 2014-03-08 ENCOUNTER — Ambulatory Visit (INDEPENDENT_AMBULATORY_CARE_PROVIDER_SITE_OTHER): Payer: Medicare Other | Admitting: Family Medicine

## 2014-03-08 ENCOUNTER — Encounter: Payer: Self-pay | Admitting: Family Medicine

## 2014-03-08 VITALS — BP 108/77 | HR 69 | Temp 98.0°F

## 2014-03-08 DIAGNOSIS — Z0000 Encounter for general adult medical examination without abnormal findings: Secondary | ICD-10-CM | POA: Diagnosis not present

## 2014-03-08 DIAGNOSIS — H60392 Other infective otitis externa, left ear: Secondary | ICD-10-CM

## 2014-03-08 DIAGNOSIS — H60399 Other infective otitis externa, unspecified ear: Secondary | ICD-10-CM

## 2014-03-08 MED ORDER — CIPROFLOXACIN-DEXAMETHASONE 0.3-0.1 % OT SUSP: 4.0000 [drp] | Freq: Two times a day (BID) | OTIC | Status: DC

## 2014-03-08 NOTE — Progress Notes (Signed)
Pre visit review using our clinic review tool, if applicable. No additional management support is needed unless otherwise documented below in the visit note. 

## 2014-03-08 NOTE — Progress Notes (Signed)
48 year old woman with a history of end-stage renal disease, on dialysis 3 times per week, CAD, sleep apnea, currently not on CPAP, diabetes, anemia, depression here to have forms filled out for [facility].  Has been disabled due to ESRD and diabetic neuropathy for years. Wheelchair bound, non ambulatory.  I have not been monitoring her DM.   Last a1c was 6.8 per pt, two weeks ago.   Left ear pain/drainage- had tubes placed in her ears, noticed drainage from left ear past few days.  On clindamycin- prescribed by vascular for foot infection. No fevers.   Patient Active Problem List   Diagnosis Date Noted  . Personal history of colonic polyps - adenomas 02/15/2014  . ESRD (end stage renal disease) 08/15/2013  . Family history of ischemic heart disease 08/15/2013  . Nonspecific abnormal electrocardiogram (ECG) (EKG) 01/24/2013  . General medical exam 03/12/2011  . UNSPECIFIED HYPOTENSION 03/27/2010  . DIZZINESS 03/27/2010  . DIABETES MELLITUS, TYPE II 07/24/2009  . DIABETIC  RETINOPATHY 07/24/2009  . DIABETIC PERIPHERAL NEUROPATHY 07/24/2009  . MORBID OBESITY 07/24/2009  . ADJ DISORDER WITH MIXED ANXIETY & DEPRESSED MOOD 07/24/2009  . PULMONARY NODULE, SOLITARY 07/24/2009  . RENAL FAILURE 07/24/2009  . SECONDARY HYPERPARATHYROIDISM 07/24/2009   Past Medical History  Diagnosis Date  . Diabetes mellitus     type 2  . Idiopathic parathyroidism     secondary, hyper  . Lung nodule     left lung  . Sinus problem   . Neuropathy   . Hyperkalemia   . Allergy     seasonal  . Cataract     surgery  . GERD (gastroesophageal reflux disease)   . Low blood pressure   . Neuromuscular disorder   . Gait difficulty     "uses motor or standard wheelchair" -unsteady on feet.  . Arteriovenous fistula     Rt. upper arm, 02-08-14 now has AV Goretex graft Lt. thigh  . ESRD (end stage renal disease)     end stage, on dialysis since 05/2002. dry wt. 113 kg.M-W-F, [place] [address].  Marland Kitchen  Complication of anesthesia     BP runs usually low. Had an issue with "twilight sleep" in past.  . Disorder of neurophysis     cannot walk  . Personal history of colonic polyps - adenomas 02/15/2014    02/15/2014 2 small polyps , max 7 mm transverse colon     Past Surgical History  Procedure Laterality Date  . Cataract extraction Bilateral   . Toe amputation Bilateral     two different surgeries-"multiple toes involved"  . Fistula surgery      multiple graft  . Knee arthroscopy    . Arteriovenous graft placement Left     lt. thigh at present  . Av fistula placement Right     right upper arm  . Breast surgery      removal of  precancerous  . Colonoscopy with propofol N/A 02/15/2014    Procedure: COLONOSCOPY WITH PROPOFOL;  Surgeon: Gatha Mayer, MD;  Location: [facility];  Service: Endoscopy;  Laterality: N/A;   History  Substance Use Topics  . Smoking status: Never Smoker   . Smokeless tobacco: Never Used  . Alcohol Use: Yes     Comment: occasionally   Family History  Problem Relation Age of Onset  . Hypertension Mother   . Liver disease Mother   . Hypertension Father   . Colon polyps Father   . Heart disease Father   . Diabetes  Maternal Grandmother   . Diabetes Maternal Grandfather   . Diabetes Paternal Grandmother   . Diabetes Paternal Grandfather   . Colon cancer Neg Hx    Allergies  Allergen Reactions  . Amitriptyline Other (See Comments)    sleepy  . Benadryl [Diphenhydramine Hcl (Sleep)] Other (See Comments)    LBP  . Morphine     Pt is unsure   . Percocet [Oxycodone-Acetaminophen] Nausea And Vomiting  . Tylenol [Acetaminophen] Other (See Comments)    LBP  . Wellbutrin [Bupropion] Other (See Comments)    Bites nails    Current Outpatient Prescriptions on File Prior to Visit  Medication Sig Dispense Refill  . aspirin EC 81 MG tablet Take 81 mg by mouth at bedtime.      . B-D ULTRAFINE III SHORT PEN 31G X 8 MM MISC USE WITH INSULIN THREE TIMES DAILY   100 each  0  . cinacalcet (SENSIPAR) 60 MG tablet Take 60 mg by mouth daily.        Marland Kitchen doxycycline (VIBRAMYCIN) 100 MG capsule Take 100 mg by mouth 2 (two) times daily.       . fluconazole (DIFLUCAN) 100 MG tablet Take 100 mg by mouth as needed (For yeast infections cause by doxycycline).       . gabapentin (NEURONTIN) 100 MG tablet Take 100-200 mg by mouth 2 (two) times daily. 200 mg  in the a.m. And 100 mg  at night      . glucose blood (ONETOUCH VERIO) test strip 1 each by Other route 2 (two) times daily. And lancets 2/day 250.01  100 each  12  . insulin aspart (NOVOLOG FLEXPEN) 100 UNIT/ML SOPN FlexPen Inject 25 Units into the skin 3 (three) times daily with meals. And pen needles 3/day  10 pen  11  . LANTUS SOLOSTAR 100 UNIT/ML SOPN Inject 50 mg into the skin at bedtime.       Marland Kitchen loperamide (IMODIUM) 2 MG capsule Take 2 mg by mouth as needed for diarrhea or loose stools.      . midodrine (PROAMATINE) 10 MG tablet Take 20 mg by mouth 3 (three) times daily. Take two 10 mg tabs 3 times a day      . Multiple Vitamin (RENAL MULTIVITAMIN/ZINC) TABS Take 1 tablet by mouth daily.        . sevelamer carbonate (RENVELA) 800 MG tablet Take 4,000 mg by mouth 3 (three) times daily with meals.       No current facility-administered medications on file prior to visit.   The PMH, PSH, Social History, Family History, Medications, and allergies have been reviewed in [facility], and have been updated if relevant.     The PMH, PSH, Social History, Family History, Medications, and allergies have been reviewed in [facility], and have been updated if relevant.     ROS: See HPI     Physical Exam BP 108/77  Pulse 69  Temp(Src) 98 F (36.7 C) (Oral)  SpO2 93%    General:  Well developed, well nourished, in no acute distress. Head:  normocephalic and atraumatic EARS:  Left ear- canal full of drainage, unable to visualize TM. Neck:  Neck supple, no JVD. No masses, thyromegaly or abnormal cervical nodes. Lungs:   Clear bilaterally to auscultation and percussion. Heart:  Non-displaced PMI, chest non-tender; regular rate and rhythm, S1, S2 with I-II SEM RSB, no rubs or gallops. Carotid upstroke normal, no bruit.  Pedals normal pulses. No edema, no varicosities. Abdomen:  Bowel sounds  positive; abdomen soft and non-tender without masses Msk:  Back normal, normal gait. Muscle strength and tone normal. Pulses:  pulses normal in all 4 extremities Extremities:  No clubbing or cyanosis. Neurologic:  Alert and oriented x 3. Skin:  Intact without lesions or rashes. Psych:  Normal affect, in wheelchair.

## 2014-03-08 NOTE — Assessment & Plan Note (Addendum)
>  25 minutes spent in face to face time with patient, >50% spent in counselling or coordination of care. My medical assistant actually spent time on the phone with [employer] trying to figure out what we needed to fill out.  I did feel she is safe to drive with the additional provisions made in her vehicle, i.e, hand controls, but I could not fill out other parts of her exam.  She has asked her vascular doctor to fill out other portions.

## 2014-03-08 NOTE — Assessment & Plan Note (Signed)
On oral clindamycin. Add cipro dex- e rx sent.

## 2014-03-09 ENCOUNTER — Telehealth: Payer: Self-pay | Admitting: *Deleted

## 2014-03-09 NOTE — Telephone Encounter (Signed)
Spoke to pt and informed her that she is needing labs for DIABETIC BUNDLE; pt states that she will contact office back to schedule labs

## 2014-03-13 ENCOUNTER — Inpatient Hospital Stay: Payer: Self-pay | Admitting: Internal Medicine

## 2014-03-13 LAB — POTASSIUM
Potassium: 5.6 mmol/L — ABNORMAL HIGH (ref 3.5–5.1)
Potassium: 3.6 mmol/L (ref 3.5–5.1)

## 2014-03-13 LAB — BASIC METABOLIC PANEL
ANION GAP: 14 (ref 7–16)
BUN: 59 mg/dL — ABNORMAL HIGH (ref 7–18)
CALCIUM: 9.4 mg/dL (ref 8.5–10.1)
CO2: 28 mmol/L (ref 21–32)
Chloride: 91 mmol/L — ABNORMAL LOW (ref 98–107)
Creatinine: 7.09 mg/dL — ABNORMAL HIGH (ref 0.60–1.30)
EGFR (African American): 7 — ABNORMAL LOW
EGFR (Non-African Amer.): 6 — ABNORMAL LOW
GLUCOSE: 198 mg/dL — AB (ref 65–99)
OSMOLALITY: 288 (ref 275–301)
Potassium: 6 mmol/L — ABNORMAL HIGH (ref 3.5–5.1)
SODIUM: 133 mmol/L — AB (ref 136–145)

## 2014-03-13 LAB — PHOSPHORUS: Phosphorus: 7.4 mg/dL — ABNORMAL HIGH (ref 2.5–4.9)

## 2014-03-14 LAB — BASIC METABOLIC PANEL
Anion Gap: 11 (ref 7–16)
BUN: 35 mg/dL — AB (ref 7–18)
CREATININE: 5.21 mg/dL — AB (ref 0.60–1.30)
Calcium, Total: 8.9 mg/dL (ref 8.5–10.1)
Chloride: 93 mmol/L — ABNORMAL LOW (ref 98–107)
Co2: 33 mmol/L — ABNORMAL HIGH (ref 21–32)
EGFR (African American): 11 — ABNORMAL LOW
GFR CALC NON AF AMER: 9 — AB
GLUCOSE: 206 mg/dL — AB (ref 65–99)
Osmolality: 288 (ref 275–301)
Potassium: 4.6 mmol/L (ref 3.5–5.1)
SODIUM: 137 mmol/L (ref 136–145)

## 2014-03-14 LAB — CBC WITH DIFFERENTIAL/PLATELET
BASOS ABS: 0.1 10*3/uL (ref 0.0–0.1)
Basophil %: 1.3 %
Eosinophil #: 0.2 10*3/uL (ref 0.0–0.7)
Eosinophil %: 3.5 %
HCT: 48.3 % — AB (ref 35.0–47.0)
HGB: 15.2 g/dL (ref 12.0–16.0)
LYMPHS PCT: 14.2 %
Lymphocyte #: 0.9 10*3/uL — ABNORMAL LOW (ref 1.0–3.6)
MCH: 32.8 pg (ref 26.0–34.0)
MCHC: 31.4 g/dL — ABNORMAL LOW (ref 32.0–36.0)
MCV: 105 fL — ABNORMAL HIGH (ref 80–100)
MONO ABS: 0.7 x10 3/mm (ref 0.2–0.9)
Monocyte %: 11.9 %
NEUTROS PCT: 69.1 %
Neutrophil #: 4.2 10*3/uL (ref 1.4–6.5)
PLATELETS: 90 10*3/uL — AB (ref 150–440)
RBC: 4.62 10*6/uL (ref 3.80–5.20)
RDW: 17.7 % — AB (ref 11.5–14.5)
WBC: 6.1 10*3/uL (ref 3.6–11.0)

## 2014-03-22 DIAGNOSIS — T23299A Burn of second degree of multiple sites of unspecified wrist and hand, initial encounter: Secondary | ICD-10-CM | POA: Insufficient documentation

## 2014-03-22 DIAGNOSIS — R21 Rash and other nonspecific skin eruption: Secondary | ICD-10-CM | POA: Insufficient documentation

## 2014-04-03 ENCOUNTER — Ambulatory Visit: Payer: BC Managed Care – PPO | Admitting: Physician Assistant

## 2014-05-08 ENCOUNTER — Inpatient Hospital Stay: Payer: Self-pay | Admitting: Internal Medicine

## 2014-05-08 LAB — COMPREHENSIVE METABOLIC PANEL
ALK PHOS: 162 U/L — AB
AST: 55 U/L — AB (ref 15–37)
Albumin: 3.3 g/dL — ABNORMAL LOW (ref 3.4–5.0)
Anion Gap: 16 (ref 7–16)
BUN: 51 mg/dL — ABNORMAL HIGH (ref 7–18)
Bilirubin,Total: 0.5 mg/dL (ref 0.2–1.0)
CALCIUM: 8.9 mg/dL (ref 8.5–10.1)
CHLORIDE: 97 mmol/L — AB (ref 98–107)
Co2: 22 mmol/L (ref 21–32)
Creatinine: 7.4 mg/dL — ABNORMAL HIGH (ref 0.60–1.30)
EGFR (African American): 7 — ABNORMAL LOW
EGFR (Non-African Amer.): 6 — ABNORMAL LOW
Glucose: 570 mg/dL (ref 65–99)
OSMOLALITY: 310 (ref 275–301)
Potassium: 5 mmol/L (ref 3.5–5.1)
SGPT (ALT): 83 U/L — ABNORMAL HIGH
SODIUM: 135 mmol/L — AB (ref 136–145)
Total Protein: 7.3 g/dL (ref 6.4–8.2)

## 2014-05-08 LAB — CBC WITH DIFFERENTIAL/PLATELET
Basophil #: 0.1 10*3/uL (ref 0.0–0.1)
Basophil %: 2.2 %
EOS ABS: 0.3 10*3/uL (ref 0.0–0.7)
Eosinophil %: 4.9 %
HCT: 48 % — ABNORMAL HIGH (ref 35.0–47.0)
HGB: 14.5 g/dL (ref 12.0–16.0)
Lymphocyte #: 0.9 10*3/uL — ABNORMAL LOW (ref 1.0–3.6)
Lymphocyte %: 17.2 %
MCH: 32.8 pg (ref 26.0–34.0)
MCHC: 30.2 g/dL — ABNORMAL LOW (ref 32.0–36.0)
MCV: 108 fL — AB (ref 80–100)
Monocyte #: 0.7 x10 3/mm (ref 0.2–0.9)
Monocyte %: 12.3 %
Neutrophil #: 3.5 10*3/uL (ref 1.4–6.5)
Neutrophil %: 63.4 %
Platelet: 137 10*3/uL — ABNORMAL LOW (ref 150–440)
RBC: 4.43 10*6/uL (ref 3.80–5.20)
RDW: 17.8 % — AB (ref 11.5–14.5)
WBC: 5.5 10*3/uL (ref 3.6–11.0)

## 2014-05-08 LAB — TROPONIN I: Troponin-I: <0.02 ng/mL

## 2014-05-08 LAB — LIPASE, BLOOD: Lipase: 754 U/L — ABNORMAL HIGH (ref 73–393)

## 2014-05-09 LAB — CBC WITH DIFFERENTIAL/PLATELET
BASOS ABS: 0.1 10*3/uL (ref 0.0–0.1)
BASOS PCT: 1.4 %
Eosinophil #: 0.4 10*3/uL (ref 0.0–0.7)
Eosinophil %: 5.5 %
HCT: 42.2 % (ref 35.0–47.0)
HGB: 13 g/dL (ref 12.0–16.0)
LYMPHS ABS: 1.4 10*3/uL (ref 1.0–3.6)
Lymphocyte %: 21.1 %
MCH: 32.3 pg (ref 26.0–34.0)
MCHC: 30.8 g/dL — ABNORMAL LOW (ref 32.0–36.0)
MCV: 105 fL — AB (ref 80–100)
MONOS PCT: 12.4 %
Monocyte #: 0.8 x10 3/mm (ref 0.2–0.9)
Neutrophil #: 3.9 10*3/uL (ref 1.4–6.5)
Neutrophil %: 59.6 %
PLATELETS: 128 10*3/uL — AB (ref 150–440)
RBC: 4.02 10*6/uL (ref 3.80–5.20)
RDW: 17.5 % — ABNORMAL HIGH (ref 11.5–14.5)
WBC: 6.6 10*3/uL (ref 3.6–11.0)

## 2014-05-09 LAB — COMPREHENSIVE METABOLIC PANEL
ALBUMIN: 3.1 g/dL — AB (ref 3.4–5.0)
ALT: 70 U/L — AB
ANION GAP: 10 (ref 7–16)
Alkaline Phosphatase: 146 U/L — ABNORMAL HIGH
BUN: 56 mg/dL — AB (ref 7–18)
Bilirubin,Total: 0.4 mg/dL (ref 0.2–1.0)
Calcium, Total: 9.2 mg/dL (ref 8.5–10.1)
Chloride: 99 mmol/L (ref 98–107)
Co2: 26 mmol/L (ref 21–32)
Creatinine: 7.9 mg/dL — ABNORMAL HIGH (ref 0.60–1.30)
EGFR (African American): 6 — ABNORMAL LOW
EGFR (Non-African Amer.): 6 — ABNORMAL LOW
GLUCOSE: 403 mg/dL — AB (ref 65–99)
Osmolality: 302 (ref 275–301)
Potassium: 4.9 mmol/L (ref 3.5–5.1)
SGOT(AST): 36 U/L (ref 15–37)
SODIUM: 135 mmol/L — AB (ref 136–145)
Total Protein: 6.7 g/dL (ref 6.4–8.2)

## 2014-05-09 LAB — ETHANOL: Ethanol: <3 mg/dL

## 2014-05-09 LAB — TSH: Thyroid Stimulating Horm: 2.78 u[IU]/mL

## 2014-05-09 LAB — HEMOGLOBIN A1C: Hemoglobin A1C: 8.3 % — ABNORMAL HIGH (ref 4.2–6.3)

## 2014-05-09 LAB — LIPASE, BLOOD: Lipase: 589 U/L — ABNORMAL HIGH (ref 73–393)

## 2014-05-10 LAB — LIPID PANEL
CHOLESTEROL: 163 mg/dL (ref 0–200)
HDL: 31 mg/dL — AB (ref 40–60)
TRIGLYCERIDES: 452 mg/dL — AB (ref 0–200)

## 2014-05-10 LAB — LIPASE, BLOOD: LIPASE: 145 U/L (ref 73–393)

## 2014-07-10 ENCOUNTER — Ambulatory Visit: Payer: Self-pay | Admitting: Vascular Surgery

## 2014-08-25 ENCOUNTER — Inpatient Hospital Stay: Payer: Self-pay | Admitting: Internal Medicine

## 2014-08-25 LAB — CBC
HCT: 46 % (ref 35.0–47.0)
HGB: 14.4 g/dL (ref 12.0–16.0)
MCH: 33.1 pg (ref 26.0–34.0)
MCHC: 31.2 g/dL — ABNORMAL LOW (ref 32.0–36.0)
MCV: 106 fL — AB (ref 80–100)
Platelet: 134 10*3/uL — ABNORMAL LOW (ref 150–440)
RBC: 4.35 10*6/uL (ref 3.80–5.20)
RDW: 18.7 % — AB (ref 11.5–14.5)
WBC: 7.5 10*3/uL (ref 3.6–11.0)

## 2014-08-25 LAB — COMPREHENSIVE METABOLIC PANEL
ALBUMIN: 3.5 g/dL (ref 3.4–5.0)
ALK PHOS: 218 U/L — AB
ALT: 55 U/L
AST: 27 U/L (ref 15–37)
Anion Gap: 14 (ref 7–16)
BILIRUBIN TOTAL: 0.5 mg/dL (ref 0.2–1.0)
BUN: 124 mg/dL — ABNORMAL HIGH (ref 7–18)
CO2: 25 mmol/L (ref 21–32)
CREATININE: 9.64 mg/dL — AB (ref 0.60–1.30)
Calcium, Total: 8.3 mg/dL — ABNORMAL LOW (ref 8.5–10.1)
Chloride: 94 mmol/L — ABNORMAL LOW (ref 98–107)
EGFR (African American): 6 — ABNORMAL LOW
EGFR (Non-African Amer.): 5 — ABNORMAL LOW
Glucose: 168 mg/dL — ABNORMAL HIGH (ref 65–99)
OSMOLALITY: 310 (ref 275–301)
POTASSIUM: 6.2 mmol/L — AB (ref 3.5–5.1)
Sodium: 133 mmol/L — ABNORMAL LOW (ref 136–145)
TOTAL PROTEIN: 7.3 g/dL (ref 6.4–8.2)

## 2014-08-25 LAB — PROTIME-INR
INR: 1
Prothrombin Time: 12.8 secs (ref 11.5–14.7)

## 2014-08-25 LAB — POTASSIUM: Potassium: 5.6 mmol/L — ABNORMAL HIGH (ref 3.5–5.1)

## 2014-08-26 LAB — PHOSPHORUS: Phosphorus: 9.7 mg/dL — ABNORMAL HIGH (ref 2.5–4.9)

## 2014-08-26 LAB — POTASSIUM
Potassium: 5.8 mmol/L — ABNORMAL HIGH (ref 3.5–5.1)
Potassium: 6 mmol/L — ABNORMAL HIGH (ref 3.5–5.1)

## 2014-08-27 LAB — BASIC METABOLIC PANEL
Anion Gap: 15 (ref 7–16)
BUN: 75 mg/dL — ABNORMAL HIGH (ref 7–18)
CHLORIDE: 97 mmol/L — AB (ref 98–107)
CREATININE: 7.47 mg/dL — AB (ref 0.60–1.30)
Calcium, Total: 8.1 mg/dL — ABNORMAL LOW (ref 8.5–10.1)
Co2: 26 mmol/L (ref 21–32)
EGFR (Non-African Amer.): 6 — ABNORMAL LOW
GFR CALC AF AMER: 8 — AB
Glucose: 233 mg/dL — ABNORMAL HIGH (ref 65–99)
OSMOLALITY: 305 (ref 275–301)
Potassium: 4.9 mmol/L (ref 3.5–5.1)
Sodium: 138 mmol/L (ref 136–145)

## 2014-08-27 LAB — PLATELET COUNT: Platelet: 121 10*3/uL — ABNORMAL LOW (ref 150–440)

## 2014-08-27 LAB — HEMOGLOBIN A1C: Hemoglobin A1C: 7.8 % — ABNORMAL HIGH (ref 4.2–6.3)

## 2014-08-27 LAB — PHOSPHORUS: Phosphorus: 8.6 mg/dL — ABNORMAL HIGH (ref 2.5–4.9)

## 2014-08-28 LAB — PHOSPHORUS: Phosphorus: 5.8 mg/dL — ABNORMAL HIGH (ref 2.5–4.9)

## 2014-10-25 ENCOUNTER — Encounter: Payer: Self-pay | Admitting: Family Medicine

## 2014-10-25 ENCOUNTER — Ambulatory Visit (INDEPENDENT_AMBULATORY_CARE_PROVIDER_SITE_OTHER): Payer: Medicare Other | Admitting: Family Medicine

## 2014-10-25 VITALS — BP 104/70 | HR 74 | Temp 98.1°F | Wt 245.8 lb

## 2014-10-25 DIAGNOSIS — N186 End stage renal disease: Secondary | ICD-10-CM

## 2014-10-25 DIAGNOSIS — E0849 Diabetes mellitus due to underlying condition with other diabetic neurological complication: Secondary | ICD-10-CM

## 2014-10-25 NOTE — Progress Notes (Signed)
Pre visit review using our clinic review tool, if applicable. No additional management support is needed unless otherwise documented below in the visit note. 

## 2014-10-25 NOTE — Assessment & Plan Note (Signed)
With renal and neuro complications. >25 minutes spent in face to face time with patient, >50% spent in counselling or coordination of care. Forms completed for [employer] and returned to pt.  Will fill out forms for diabetic shoes and inserts given her h/o diabetic ulcers and amputations. The patient indicates understanding of these issues and agrees with the plan.

## 2014-10-25 NOTE — Progress Notes (Signed)
49 year old woman with a history of end-stage renal disease, on dialysis 3 times per week, CAD, sleep apnea, currently not on CPAP, diabetes, anemia, depression here to have forms filled out for [facility].  Has been disabled due to ESRD and diabetic neuropathy for years. Wheelchair bound, non ambulatory.  I have not been monitoring her DM since she has a1c checked through nephrologist.   Last a1c was 6.6 per pt,last week- will request these records.   H/o bilateral toe amputations.  She would like for me to also fill out form requesting diabetic shoes/inserts.   Patient Active Problem List   Diagnosis Date Noted  . Otitis, externa, infective 03/08/2014  . Personal history of colonic polyps - adenomas 02/15/2014  . ESRD (end stage renal disease) 08/15/2013  . Family history of ischemic heart disease 08/15/2013  . Nonspecific abnormal electrocardiogram (ECG) (EKG) 01/24/2013  . General medical exam 03/12/2011  . UNSPECIFIED HYPOTENSION 03/27/2010  . DIZZINESS 03/27/2010  . DIABETES MELLITUS, TYPE II 07/24/2009  . DIABETIC  RETINOPATHY 07/24/2009  . DIABETIC PERIPHERAL NEUROPATHY 07/24/2009  . MORBID OBESITY 07/24/2009  . ADJ DISORDER WITH MIXED ANXIETY & DEPRESSED MOOD 07/24/2009  . PULMONARY NODULE, SOLITARY 07/24/2009  . RENAL FAILURE 07/24/2009  . SECONDARY HYPERPARATHYROIDISM 07/24/2009   Past Medical History  Diagnosis Date  . Diabetes mellitus     type 2  . Idiopathic parathyroidism     secondary, hyper  . Lung nodule     left lung  . Sinus problem   . Neuropathy   . Hyperkalemia   . Allergy     seasonal  . Cataract     surgery  . GERD (gastroesophageal reflux disease)   . Low blood pressure   . Neuromuscular disorder   . Gait difficulty     "uses motor or standard wheelchair" -unsteady on feet.  . Arteriovenous fistula     Rt. upper arm, 02-08-14 now has AV Goretex graft Lt. thigh  . ESRD (end stage renal disease)     end stage, on dialysis since 05/2002. dry wt.  113 kg.M-W-F, [place] [address].  Marland Kitchen Complication of anesthesia     BP runs usually low. Had an issue with "twilight sleep" in past.  . Disorder of neurophysis     cannot walk  . Personal history of colonic polyps - adenomas 02/15/2014    02/15/2014 2 small polyps , max 7 mm transverse colon     Past Surgical History  Procedure Laterality Date  . Cataract extraction Bilateral   . Toe amputation Bilateral     two different surgeries-"multiple toes involved"  . Fistula surgery      multiple graft  . Knee arthroscopy    . Arteriovenous graft placement Left     lt. thigh at present  . Av fistula placement Right     right upper arm  . Breast surgery      removal of  precancerous  . Colonoscopy with propofol N/A 02/15/2014    Procedure: COLONOSCOPY WITH PROPOFOL;  Surgeon: Gatha Mayer, MD;  Location: [facility];  Service: Endoscopy;  Laterality: N/A;   History  Substance Use Topics  . Smoking status: Never Smoker   . Smokeless tobacco: Never Used  . Alcohol Use: Yes     Comment: occasionally   Family History  Problem Relation Age of Onset  . Hypertension Mother   . Liver disease Mother   . Hypertension Father   . Colon polyps Father   . Heart disease  Father   . Diabetes Maternal Grandmother   . Diabetes Maternal Grandfather   . Diabetes Paternal Grandmother   . Diabetes Paternal Grandfather   . Colon cancer Neg Hx    Allergies  Allergen Reactions  . Amitriptyline Other (See Comments)    sleepy  . Benadryl [Diphenhydramine Hcl (Sleep)] Other (See Comments)    LBP  . Morphine     Pt is unsure   . Percocet [Oxycodone-Acetaminophen] Nausea And Vomiting  . Tylenol [Acetaminophen] Other (See Comments)    LBP  . Wellbutrin [Bupropion] Other (See Comments)    Bites nails    Current Outpatient Prescriptions on File Prior to Visit  Medication Sig Dispense Refill  . aspirin EC 81 MG tablet Take 81 mg by mouth at bedtime.    . B-D ULTRAFINE III SHORT PEN 31G X 8  MM MISC USE WITH INSULIN THREE TIMES DAILY 100 each 0  . cinacalcet (SENSIPAR) 60 MG tablet Take 60 mg by mouth daily.      . ciprofloxacin-dexamethasone (CIPRODEX) otic suspension Place 4 drops into the left ear 2 (two) times daily. 7.5 mL 0  . doxycycline (VIBRAMYCIN) 100 MG capsule Take 100 mg by mouth 2 (two) times daily.     . fluconazole (DIFLUCAN) 100 MG tablet Take 100 mg by mouth as needed (For yeast infections cause by doxycycline).     . gabapentin (NEURONTIN) 100 MG tablet Take 100-200 mg by mouth 2 (two) times daily. 200 mg  in the a.m. And 100 mg  at night    . glucose blood (ONETOUCH VERIO) test strip 1 each by Other route 2 (two) times daily. And lancets 2/day 250.01 100 each 12  . insulin aspart (NOVOLOG FLEXPEN) 100 UNIT/ML SOPN FlexPen Inject 25 Units into the skin 3 (three) times daily with meals. And pen needles 3/day 10 pen 11  . LANTUS SOLOSTAR 100 UNIT/ML SOPN Inject 50 mg into the skin at bedtime.     Marland Kitchen loperamide (IMODIUM) 2 MG capsule Take 2 mg by mouth as needed for diarrhea or loose stools.    . midodrine (PROAMATINE) 10 MG tablet Take 20 mg by mouth 3 (three) times daily. Take two 10 mg tabs 3 times a day    . Multiple Vitamin (RENAL MULTIVITAMIN/ZINC) TABS Take 1 tablet by mouth daily.      . sevelamer carbonate (RENVELA) 800 MG tablet Take 4,000 mg by mouth 3 (three) times daily with meals.     No current facility-administered medications on file prior to visit.   The PMH, PSH, Social History, Family History, Medications, and allergies have been reviewed in [facility], and have been updated if relevant.     The PMH, PSH, Social History, Family History, Medications, and allergies have been reviewed in [facility], and have been updated if relevant.     ROS: See HPI     Physical Exam BP 104/70 mmHg  Pulse 74  Temp(Src) 98.1 F (36.7 C) (Oral)  Wt 245 lb 12.8 oz (111.494 kg)  SpO2 96%    General:  Well developed, well nourished, in no acute distress. Head:   normocephalic and atraumatic Neck:  Neck supple, no JVD. No masses, thyromegaly or abnormal cervical nodes. Lungs:  Clear bilaterally to auscultation and percussion. Heart:  Non-displaced PMI, chest non-tender; regular rate and rhythm, S1, S2 with I-II SEM RSB, no rubs or gallops. Carotid upstroke normal, no bruit.  Pedals normal pulses. No edema, no varicosities. Abdomen:  Bowel sounds positive; abdomen soft and  non-tender without masses Msk:  Back normal, normal gait. Muscle strength and tone normal. Pulses:  pulses normal in all 4 extremities Extremities:   Partial amputation  Of 2nd and third toe, left and complete amputation of great and 2nd toe right, Decreased pedal pulses Neurologic:  Alert and oriented x 3. Skin:  Intact without lesions or rashes. Psych:  Normal affect, in wheelchair.

## 2014-11-08 ENCOUNTER — Ambulatory Visit: Payer: BC Managed Care – PPO | Admitting: Family Medicine

## 2014-11-13 ENCOUNTER — Encounter: Payer: Self-pay | Admitting: Surgery

## 2014-11-15 DIAGNOSIS — T23009A Burn of unspecified degree of unspecified hand, unspecified site, initial encounter: Secondary | ICD-10-CM | POA: Insufficient documentation

## 2014-11-28 ENCOUNTER — Telehealth: Payer: Self-pay

## 2014-11-28 NOTE — Telephone Encounter (Signed)
Pt left v/m requesting status of forms for diabetic shoes faxed to [employer]; pt has scab on top of toe and request diabetic shoe paperwork to be done ASAP.pt request cb.

## 2014-11-28 NOTE — Telephone Encounter (Signed)
Spoke to pt and informed her that the paperwork is not scanned into her chart and requested it be re-sent for completion

## 2014-11-28 NOTE — Telephone Encounter (Signed)
I thought we completed those at her office visit. If you cannot find them scanned into the chart, we may need another form.

## 2014-12-18 NOTE — Op Note (Signed)
PATIENT NAME:  Tonya, [name] MR#:  [id number] DATE OF BIRTH:  08-Jan-1966  DATE OF PROCEDURE:  07/19/2012  PREOPERATIVE DIAGNOSES:  1. 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  2. End-stage renal disease requiring hemodialysis.  3. 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   POSTOPERATIVE DIAGNOSES:  1. 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  2. End-stage renal disease requiring hemodialysis.  3. 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   PROCEDURES PERFORMED: 1. Abdominal aortogram.  2. Left lower extremity distal runoff, third order catheter placement, with additional third order x2.  3. Crosser atherectomy with percutaneous transluminal angioplasty to 3 mm, left posterior tibial artery.  4. Crosser atherectomy with percutaneous transluminal angioplasty to 2.5 mm, left peroneal artery.   SURGEON: Hortencia Pilar, MD  SEDATION: Precedex drip with 100 mcg of fentanyl IV. Continuous ECG, pulse oximetry and cardiopulmonary monitoring was performed throughout the entire procedure by the interventional radiology nurse. Total sedation time was 1 hour, 40 minutes.   ACCESS: 6 French sheath, right common femoral artery.   FLUOROSCOPY TIME: 12.7 minutes.   CONTRAST USED: Isovue 115 mL.   INDICATIONS: [name] is a 49 year old woman who presents to the office with new ulcerations and gangrenous changes to the dorsum of the left foot. Physical examination as well as noninvasive studies demonstrated significant atherosclerotic occlusive disease and [name] is therefore undergoing angiography with the hope for intervention. Risks and benefits were reviewed, all questions are answered, and the patient has agreed to proceed.   DESCRIPTION OF PROCEDURE: The patient is taken to the special procedures suite and placed in the supine position. After adequate  sedation is achieved, the right groin is prepped and draped in sterile fashion. Ultrasound is placed in a sterile sleeve. Ultrasound is utilized secondary to lack of appropriate landmarks and to avoid vascular injury. Under direct ultrasound visualization, the common femoral artery is identified. It is echolucent and pulsatile indicating patency. Image is recorded for the permanent record. 1% lidocaine is then infiltrated in the soft tissues and with direct continuous ultrasound visualization micropuncture needle is inserted, microwire is advanced. Microsheath would not track well and therefore a stiffened micropuncture sheath is opened which tracks easily over the wire. Under fluoroscopic guidance an Amplatz superstiff wire is negotiated into the aorta and subsequently a 5 [place] sheath and 5 French pigtail catheter. The pigtail catheter is positioned at the level of T12 and AP projection of the aorta is obtained with a bolus injection of contrast. After review of the images, the pigtail catheter is repositioned to above the bifurcation and a RAO projection of the pelvis is obtained.   Stiff angled Glidewire and pigtail catheter are then used to cross the aortic bifurcation. The catheter is advanced down into the distal external iliac where AP and oblique views of the groin are obtained. Once optimal imaging has been obtained in a magnified view to clearly demonstrate the bifurcation of the femoral artery as well as the anastomosis of the AV graft, wire is reintroduced and the pigtail catheter and wire are negotiated down into the superficial femoral artery. Distal runoff is then obtained, however, at the level of the tibial trifurcation, the images are inadequate. Given the initial image, the only patent tibial vessel appears to be the anterior tibial which occludes in its distal one third.   A stiff angled Glidewire is then reintroduced into the pigtail catheter. The  pigtail catheter is then exchanged for a  125 cm slip catheter and the wire and catheter are negotiated into the anterior tibial. Hand injection of contrast demonstrates occlusion. There is reconstitution of the anterior tibial but then it occludes at the level of the ankle joint and the dorsalis pedis is not reconstituted nor does the pedal arch fill from this injection. This does represent the initial third order catheter placement. The straight catheter is then repositioned to the distal popliteal where a magnified image is then obtained. With delayed imaging, the posterior tibial and peroneal both are reconstituted in their proximal one thirds. The peroneal has approximately a 3 cm occlusion and the posterior tibial has a 2 cm occlusion. Posterior tibial occlusion is within several millimeters of the origin. The peroneal occlusion is approximately 4 cm distal to the peroneal's origin.   5000 units of heparin is given, Amplatz wire is reintroduced through the straight catheter, the straight catheter and 5 French sheath are removed and a 6 French RAABE sheath is advanced up and over the bifurcation and positioned with its tip at the mid SFA. The straight catheter is then reintroduced over the Amplatz and the Amplatz wire removed. Again, magnified view of the trifurcation is obtained. The [facility] 0.014 wire is then introduced through the straight catheter and negotiated into the peroneal. Straight catheter is removed and a straight Usher catheter is advanced over the wire through the 6 [place] sheath. Hand injection of contrast is then utilized to demonstrate the peroneal occlusion in a magnified image. This represents second third order catheter placement. The S6 crosser device is then prepped on the field, advanced through the Usher catheter, and in a single 20 second pass it clears the occlusion. Usher catheter is then advanced over the crosser and distal runoff is obtained through the Usher. Again, this is the second third order catheter  positioning. The 0.014 grand slam wire is reintroduced and a 2.5 mm x 6 cm balloon is advanced over the wire and exchanged for the Usher catheter. Angioplasty to 14 atmospheres for about two minutes is performed. Follow-up angiography demonstrates excellent result. The Usher catheter is then pulled back to the tibioperoneal trunk and using the catheter with a grand slam wire the cul-de-sac of the posterior tibial is engaged, the Usher catheter is then advanced down into the cul-de-sac, and the S6 device is reintroduced. Again, in a single 20 second pass, the occlusion of the posterior tibial is negotiated, Usher catheter is then advanced, and distal runoff is obtained by hand injection through the Usher. This represents the third, third order catheter placement. Distally the posterior tibial is clearly the best tibial vessel extending down with 2.5 to 3 mm diameter into a lateral plantar which fills the pedal arch and digital vessels. The 0.014 wire is reintroduced and a 3 x 4 Fox balloon is advanced over the wire and angioplastied to 14 atmospheres for approximately two minutes is performed in the posterior tibial. Hand injection of contrast from the sheath now demonstrates wide patency of the posterior tibial with an excellent match of 3 mm to its proximal segments. Unfortunately, the posterior tibial appears to have restenosed or occluded again just distal to the initial angioplasty site. Therefore, the wire is renegotiated into the peroneal without difficulty and the 2.5 x 6 balloon is reintroduced and two further angioplasties, each for two minutes, were performed in the proximal one third of the peroneal. After completing this, hand injection of contrast through the RAABE  sheath demonstrates rapid filling of both the posterior tibial and the peroneal with rapid flow down to the foot. Distal runoff is then obtained demonstrating both peroneal and distal posterior tibial and lateral plantar are widely patent and  unchanged from the preprocedure image.   Sheath is then pulled into the right side, exchanged for a 6 French 11 cm sheath, oblique view is obtained, and a Mynx device deployed without difficulty. There are no immediate complications.   INTERPRETATION: Abdominal aorta and the bilateral common, internal and external iliac arteries appear widely patent. There is no hemodynamically significant disease noted.   The left common femoral is patent. The femoral bifurcation is widely patent as is the profunda femoris, although the profunda femoris does not collateralize exceedingly well distally. The left loop thigh graft is widely patent and there is rapid filling of the venous system with proximal injections. There is moderate irregularity of approximately 50 to 60% at the venous anastomosis, but this does not appear to be flow limiting and the function of the graft appears to be quite good with rapid filling of contrast into the venous system. The arterial anastomosis is widely patent. The proximal one third of the SFA is widely patent once selective injections have been obtained and then distally the SFA as well as the popliteal are widely patent. Trifurcation demonstrates the beginning of the hemodynamically significant disease. From a SFA injection, the only tibial vessel identified initially was the anterior tibial which occluded in its distal one third and then again at the level of the dorsalis pedis. It does not appear on selective imaging that it contributes to the pedal arch.   Fortunately, the distal popliteal injection demonstrated collateral filling of both the posterior tibial and the peroneal with proximal occlusions as described above. These occlusions were both crossed with a crosser atherectomy catheter and subsequently angioplastied to 3 mm for the posterior tibial and 2.5 mm for the peroneal yielded an excellent result with rapid filling of contrast of both tibial vessels and patency down to the  foot.   SUMMARY: Successful left lower extremity limb salvage with recanalization of the posterior tibial and peroneal vessels, as described above. ____________________________ Katha Cabal, MD ggs:slb D: 07/19/2012 14:19:42 ET T: 07/19/2012 14:54:15 ET JOB#: [id number]  cc: Katha Cabal, MD, <Dictator> Marciano Sequin. Deborra Medina, [place] MD ELECTRONICALLY SIGNED 08/17/2012 16:21

## 2014-12-18 NOTE — Op Note (Signed)
PATIENT NAME:  [name], SLATTERY MR#:  [id number] DATE OF BIRTH:  02-Jun-1966  DATE OF PROCEDURE:  05/19/2012  PREOPERATIVE DIAGNOSES:  1. Endstage renal disease. 2. Clotted left thigh AV graft.   POSTOPERATIVE DIAGNOSES:  1. Endstage renal disease. 2. Clotted left thigh AV graft.   PROCEDURES PERFORMED: 1. Ultrasound guidance for vascular access to the graft in both an antegrade and retrograde fashion.  2. Left lower extremity shuntogram.  3. Catheter-directed thrombolysis with 4 mg of TPA delivered through the AngioJet AVX catheter.  4. Mechanical rheolytic thrombectomy with the AngioJet AVX catheter and Fogarty embolectomy to the graft.  5. Percutaneous transluminal angioplasty to the graft and venous anastomosis with a 7 mm diameter angioplasty balloon.   SURGEON: Algernon Huxley, M.D.   ANESTHESIA: Local with moderate conscious sedation.   ESTIMATED BLOOD LOSS: 20 to 25 mL.  FLUOROSCOPY TIME: 5 minutes.  CONTRAST USED: 25 mL.   INDICATION FOR PROCEDURE: This is a 49 year old female with a clotted graft. We are asked to try to salvage this.   DESCRIPTION OF PROCEDURE: The patient was brought to the vascular interventional radiology suite. The left lower extremity was sterilely prepped and draped and a sterile surgical field was created. Due to the pulseless nature of the graft, the graft was accessed under direct ultrasound guidance without difficulty with a micropuncture needle and micropuncture wire and sheath were placed and 6 French sheaths were placed bilaterally. Permanent image was recorded. The patient was given 3000 units of intravenous heparin for systemic anticoagulation and Magic torque wires were placed into the femoral artery and to the femoral vein. Imaging showed the graft to be clotted. Catheter-directed thrombolysis was performed with 4 mg of TPA in an AngioJet power pulse spray fashion. This was allowed to dwell for several minutes. Fogarty embolectomy was performed  of the arterial plug and mechanical rheolytic thrombectomy was performed from throughout the graft. Following this there was still some residual thrombus causing occlusion, in the midportion of the graft, and what appeared to be some stenosis at the venous anastomosis. These were treated with a 7 mm diameter angioplasty balloon with excellent angiographic completion result and a patent graft clinically. At this point, I elected to terminate the procedure. The sheaths were removed around 4-0 Monocryl pursestring sutures, pressure was held, and a sterile dressing was placed. The patient tolerated the procedure well.  ____________________________ Algernon Huxley, MD jsd:slb D: 05/19/2012 12:21:24 ET T: 05/19/2012 15:07:21 ET JOB#: [id number]  cc: Algernon Huxley, MD, <Dictator> Algernon Huxley MD ELECTRONICALLY SIGNED 05/26/2012 13:42

## 2014-12-19 ENCOUNTER — Inpatient Hospital Stay
Admit: 2014-12-19 | Disposition: A | Discharge status: Other Acute Inpatient Hospital | Payer: Self-pay | Attending: Internal Medicine | Admitting: Internal Medicine

## 2014-12-19 LAB — COMPREHENSIVE METABOLIC PANEL
ALBUMIN: 3.2 g/dL — AB
ANION GAP: 15 (ref 7–16)
Alkaline Phosphatase: 281 U/L — ABNORMAL HIGH
BUN: 24 mg/dL — ABNORMAL HIGH
Bilirubin,Total: 0.7 mg/dL
CALCIUM: 7.7 mg/dL — AB
Chloride: 88 mmol/L — ABNORMAL LOW
Co2: 31 mmol/L
Creatinine: 4.39 mg/dL — ABNORMAL HIGH
EGFR (Non-African Amer.): 11 — ABNORMAL LOW
GFR CALC AF AMER: 13 — AB
Glucose: 121 mg/dL — ABNORMAL HIGH
Potassium: 4.5 mmol/L
SGOT(AST): 234 U/L — ABNORMAL HIGH
SGPT (ALT): 106 U/L — ABNORMAL HIGH
Sodium: 134 mmol/L — ABNORMAL LOW
Total Protein: 6.9 g/dL

## 2014-12-19 LAB — MAGNESIUM: Magnesium: 1.9 mg/dL

## 2014-12-19 LAB — TROPONIN I: Troponin-I: 2.31 ng/mL — ABNORMAL HIGH

## 2014-12-19 LAB — PHOSPHORUS: Phosphorus: 4.5 mg/dL

## 2014-12-19 LAB — CK-MB: CK-MB: 3.6 ng/mL

## 2014-12-19 LAB — LACTIC ACID, PLASMA: LACTIC ACID, VENOUS: 2.9 mmol/L — AB

## 2014-12-20 LAB — CBC WITH DIFFERENTIAL/PLATELET
BANDS NEUTROPHIL: 11 %
HCT: 40.4 % (ref 35.0–47.0)
HGB: 12.7 g/dL (ref 12.0–16.0)
Lymphocytes: 11 %
MCH: 30 pg (ref 26.0–34.0)
MCHC: 31.4 g/dL — AB (ref 32.0–36.0)
MCV: 96 fL (ref 80–100)
MONOS PCT: 8 %
Metamyelocyte: 1 %
NRBC/100 WBC: 4 /
Platelet: 102 10*3/uL — ABNORMAL LOW (ref 150–440)
RBC: 4.23 10*6/uL (ref 3.80–5.20)
RDW: 17.4 % — ABNORMAL HIGH (ref 11.5–14.5)
SEGMENTED NEUTROPHILS: 69 %
WBC: 5.1 10*3/uL (ref 3.6–11.0)

## 2014-12-20 LAB — PROTIME-INR
INR: 1.1
PROTHROMBIN TIME: 14 s

## 2014-12-20 LAB — CK-MB
CK-MB: 4.5 ng/mL
CK-MB: 4.2 ng/mL

## 2014-12-20 LAB — LACTIC ACID, PLASMA: Lactic Acid, Venous: 1.9 mmol/L

## 2014-12-20 LAB — TROPONIN I
Troponin-I: 2.45 ng/mL — ABNORMAL HIGH
Troponin-I: 2.02 ng/mL — ABNORMAL HIGH

## 2014-12-21 DIAGNOSIS — I361 Nonrheumatic tricuspid (valve) insufficiency: Secondary | ICD-10-CM | POA: Diagnosis not present

## 2014-12-21 LAB — CBC WITH DIFFERENTIAL/PLATELET
Bands: 41 %
HCT: 37.2 % (ref 35.0–47.0)
HGB: 11.8 g/dL — ABNORMAL LOW (ref 12.0–16.0)
Lymphocytes: 10 %
MCH: 29.9 pg (ref 26.0–34.0)
MCHC: 31.7 g/dL — ABNORMAL LOW (ref 32.0–36.0)
MCV: 94 fL (ref 80–100)
MONOS PCT: 4 %
NRBC/100 WBC: 2 /
Platelet: 86 10*3/uL — ABNORMAL LOW (ref 150–440)
RBC: 3.94 10*6/uL (ref 3.80–5.20)
RDW: 17.4 % — ABNORMAL HIGH (ref 11.5–14.5)
Segmented Neutrophils: 45 %
WBC: 4.8 10*3/uL (ref 3.6–11.0)

## 2014-12-21 LAB — INFLUENZA A,B,H1N1 - PCR (ARMC)
H1N1 flu by pcr: DETECTED
Influenza A By PCR: POSITIVE
Influenza B By PCR: NEGATIVE

## 2014-12-21 LAB — VANCOMYCIN, TROUGH: Vancomycin, Trough: 20 ug/mL

## 2014-12-21 NOTE — Op Note (Signed)
PATIENT NAME:  [name], [name] MR#:  [id number] DATE OF BIRTH:  11/26/1965  DATE OF PROCEDURE:  12/20/2012  PREOPERATIVE DIAGNOSES:  1. Complication of arteriovenous dialysis graft.  2. End-stage renal disease requiring hemodialysis.  3. Superior vena cava syndrome.   POSTOPERATIVE DIAGNOSES:  1. Complication of arteriovenous dialysis graft.  2. End-stage renal disease requiring hemodialysis.  3. Superior vena cava syndrome.   PROCEDURES PERFORMED:  1. Contrast injection of left thigh arteriovenous graft.  2. Percutaneous transluminal angioplasty to 7 mm of the confluence of the saphenous and common femoral veins.  3. Percutaneous transluminal angioplasty and stent placement mid-portion of graft at the arterial access site.   SURGEON: Katha Cabal, MD   SEDATION: Versed 4 mg plus fentanyl 150 mcg administered IV. Continuous ECG, pulse oximetry and cardiopulmonary monitoring is performed throughout the entire procedure by the interventional radiology nurse. Total sedation time was 45 minutes.   ACCESS: A 7-French sheath, antegrade direction, left thigh AV graft.   CONTRAST USED: Isovue 55 mL.   FLUOROSCOPY TIME: 5.6 minutes.   INDICATIONS: The patient is a 49 year old woman currently maintained on hemodialysis via a left thigh graft. They had been having increasing difficulties with cannulation of the arterial portion. Physical examination as well as noninvasive studies demonstrated several strictures and she is therefore undergoing angiography with the hope for intervention. The risks and benefits were reviewed. All questions answered. The patient agrees to proceed.   DESCRIPTION OF PROCEDURE: The patient is taken to special procedures and placed in the supine position. After adequate sedation is achieved, the left thigh is prepped and draped in sterile fashion. 1% lidocaine is infiltrated into soft tissues and access to the graft is obtained with a micropuncture needle in an  antegrade direction; micro wire followed by micro sheath, J-wire followed by a 6-French sheath. Hand injection of contrast through the 6-French sheath is then used to demonstrate the AV graft and the venous outflow.   Heparin 3000 units is given and a Magic torque wire is then exchanged for the J-wire. A 7 x 4 Dor[place] balloon is then advanced across the venous outflow extending into the common femoral and angioplasty is performed to 28 atmospheres for one minute. Follow-up angiography in a magnified oblique view demonstrates a smooth contour to the venous outflow which matches the diameter of the graft itself. There is no significant residual stenosis.   Attention is then turned to the high-grade stenoses within the graft itself at a separate and distinct location.   An 8 x 70 straight Flair stent is then deployed across the venous cannulation zone and a second 8 x 70 is deployed across the arterial cannulation zone. They overlap at the apex of the loop graft by approximately 1 cm and they are then post dilated using the 7 mm Dorado balloon. Follow-up angiography demonstrates there is now complete resolution of the tandem stenoses previously noted, as well as a complete exclusion of the pseudoaneurysm at the venous cannulation site. Venous outflow is maintained.   Pursestring suture is placed at the sheath site and the sheath is removed. There are no immediate complications.   INTERPRETATION: Initial views of the loop graft demonstrate multiple stenoses. There is venous outflow stenoses at the confluence of the saphenous and femoral vein. There were also two stenoses noted in the cannulation sites which were separate and distinct. The venous outflow was treated well with a stand-alone angioplasty. Stenting is performed at the cannulation zones.     ____________________________  Katha Cabal, MD ggs:es D: 12/20/2012 11:40:21 ET T: 12/20/2012 11:49:51 ET JOB#: [id number]  cc: Katha Cabal,  MD, <Dictator> Katha Cabal MD ELECTRONICALLY SIGNED 01/09/2013 13:46

## 2014-12-21 NOTE — Op Note (Signed)
PATIENT NAME:  [name], CAMPANELLA MR#:  [id number] DATE OF BIRTH:  1966-01-04  DATE OF PROCEDURE:  10/28/2012  PREOPERATIVE DIAGNOSIS:  1.  Gangrene of the first, second and third toes. 2.  Atherosclerotic occlusive disease, bilateral lower extremities.  3.  End-stage renal disease on hemodialysis.   POSTOPERATIVE DIAGNOSES: 1.  Gangrene of the first, second and third toes. 2.  Atherosclerotic occlusive disease, bilateral lower extremities.  3.  End-stage renal disease on hemodialysis.   PROCEDURES PERFORMED:  1.  Interphalangeal amputation of the left second toe.  2.  Interphalangeal amputation of the left third toe.  3.  Excisional debridement of all gangrenous ulceration of the great toe.   SURGEON: Katha Cabal, M.D.   ANESTHESIA: MAC.   ESTIMATED BLOOD LOSS: Minimal.   SPECIMEN: Debrided tissue to pathology permanent section.   INDICATIONS:  The patient is a 49 year old woman who presents with gangrenous changes to the left toes. She is now undergoing debridement, hopefully with closure for limb salvage. Risks and benefits were reviewed. The patient agrees to proceed.   DESCRIPTION OF PROCEDURE: The patient is taken to the operating room and placed in the supine position. After adequate general anesthesia is induced and appropriate invasive monitors are placed, she is positioned supine. The left foot is prepped and draped in sterile fashion. Appropriate timeout is called.   Marcaine 0.25% without epinephrine is then infiltrated into the bases of the first, second and third toes. A circumferential incision is then made around the second and third toes with a 15 blade scalpel and dissection is carried down to the bone. The distal phalangeal joint is exposed and then both the second and third toes are amputated through the joint. Rongeur was then used to dbride the shaft of the phalangeal proximally on both the second and third toes.  Excess tissue is debrided, hemostasis  obtained with a Bovie cautery. The wound is then irrigated with several hundred mL of saline and closed in layers using interrupted 3-0 Vicryl as well as interrupted 4-0 nylon for skin closure.   Attention is then turned to the excisional debridement of the great toe.  Using a 15 blade scalpel and toothed forceps, the necrotic tissue is debrided.  The debridement is carried down to the subcutaneous tissues. Hemostasis is obtained. The area of debridement is approximately 4 mm and circular in shape. Tissue at this level appears viable. Xeroform is placed on the wound. The foot was then dressed with fluff gauze and Kerlix. The patient tolerated the procedure well. There were no immediate complications.     ____________________________ Katha Cabal, MD ggs:ct D: 10/28/2012 09:06:49 ET T: 10/28/2012 09:55:28 ET JOB#: [id number]  cc: Katha Cabal, MD, <Dictator> Katha Cabal MD ELECTRONICALLY SIGNED 11/29/2012 17:18
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
## 2014-12-22 LAB — RENAL FUNCTION PANEL
ALBUMIN: 2.6 g/dL — AB
ANION GAP: 21 — AB (ref 7–16)
BUN: 68 mg/dL — AB
Calcium, Total: 7.2 mg/dL — ABNORMAL LOW
Chloride: 86 mmol/L — ABNORMAL LOW
Co2: 22 mmol/L
Creatinine: 8.03 mg/dL — ABNORMAL HIGH
EGFR (African American): 6 — ABNORMAL LOW
EGFR (Non-African Amer.): 5 — ABNORMAL LOW
Glucose: 374 mg/dL — ABNORMAL HIGH
PHOSPHORUS: 7.4 mg/dL — AB
POTASSIUM: 6 mmol/L — AB
Sodium: 129 mmol/L — ABNORMAL LOW

## 2014-12-22 LAB — CULTURE, BLOOD (SINGLE)

## 2014-12-22 NOTE — Op Note (Signed)
PATIENT NAME:  [name], [name] MR#:  [id number] DATE OF BIRTH:  1965-11-17  DATE OF PROCEDURE:  07/10/2014  PREOPERATIVE DIAGNOSES:  1.  Complication of dialysis access.  2.  End-stage renal disease requiring hemodialysis.  3.  Superior vena cava syndrome.  4.  Obesity.   POSTOPERATIVE DIAGNOSES:  1.  Complication of dialysis access.  2.  End-stage renal disease requiring hemodialysis.  3.  Superior vena cava syndrome.  4.  Obesity.   PROCEDURES PERFORMED:  1.  Introduction catheter into superior vena cava left brachial vein as approach.  2.  Introduction catheter into jugular vein from the original approach, representing first-order venous catheter placement.   SURGEON: Katha Cabal, MD.   SEDATION: Precedex drip.   CONTRAST USED: Isovue 25 mL.   FLUOROSCOPY TIME: 1.3 minutes.   INDICATIONS: [name] is a 49 year old woman who has had multiple accesses in both arms as well as now both thighs. She currently is maintained with a left thigh graft. She has documented superior vena cava syndrome, which was the indication for thigh-based access. Recently she has been having increasing problems with her left thigh and she is concerned regarding what kind access options she will have next. After discussion with the patient we were investigating the possibility of a HeRO graft placement in the left arm. The risks and benefits were reviewed. All questions answered. The patient agrees to proceed.   DESCRIPTION OF PROCEDURE: The patient is taken to special procedures and placed in the supine position. After adequate with a Precedex drip has been obtained she is positioned supine with her left arm extended palm upward. The left arm is prepped and draped in sterile fashion. Appropriate timeout is called.   Ultrasound is placed in a sterile sleeve and evaluation of the left arm for venous access site is made. There is a small brachial vein noted in the mid brachial artery. Initial  attempt at access yielded dark blood. A wire was advanced and the micro sheath was inserted; however, the micro sheath demonstrated pulsatile flow.  A small hand injection of contrast demonstrated that the sheath had been placed into the brachial artery and the sheath was pulled and pressure was held for approximately 10 minutes. Following this again evaluation of the arm, I did identify a vein, this appears to be the more proximal basilic vein and this was accessed with ultrasound without difficulty, the vein was compressible echolucent, indicating patency. Images recorded and under real-time visualization a micro needle was inserted, microwire followed by micro sheath. Initial injection of contrast demonstrated patency of the basilic up to the axillary, but the axillary vein demonstrated a high-grade stricture stenosis consistent with her previously placed graft. However there was the ability to negotiate past this and a Glidewire was advanced through the micro sheath and then the micro sheath was exchanged for a 5 [place] Kumpe catheter. Kumpe catheter was then advanced into the proximal axillary vein and again hand injection was performed. Now the catheter was negotiated through several areas of modest stenosis down all the way into the SVC again performing central venography and accessing the atrium without difficulty. The catheter was then pulled back into the subclavian, angled in the upward direction and the wire was then advanced into the catheter and the catheter and wire were advanced into the internal jugular vein beyond or above the level of the jaw. Contrast injection by hand was then performed demonstrating the jugular vein was also patent and should be readily accessible  for placement of the HeRO portion of the graft.   The catheter was then removed and pressure held. There were no immediate complications.   INTERPRETATION: The visualized portions of the brachial artery are disease-free, no  evidence of stricture or stenosis.   The basilic vein was patent, filling an axillary, there is a high-grade stenosis and stricture consistent with venous anastomosis of the graft, this would make accessing the axillary vein very difficult. however more proximally the distal subclavian and proximal axillary vein do appear to be of normal caliber, there are several modest stenoses noted in the subclavian as well as innominate, but it is relatively easy to get a catheter into the atrium. On using the catheter to engage the jugular this vein also appears to be adequate access and therefore the patient does have a situation where a HeRO graft with a jugular approach for the intravascular portion would be readily available. This will be discussed with the patient and timing of graft placement will also be reviewed with the patient.     ____________________________ Katha Cabal, MD ggs:bu D: 07/10/2014 18:10:50 ET T: 07/10/2014 19:50:55 ET JOB#: [id number]  cc: Katha Cabal, MD, <Dictator> Katha Cabal MD ELECTRONICALLY SIGNED 07/11/2014 8:00

## 2014-12-22 NOTE — Op Note (Signed)
PATIENT NAME:  [name], Tonya MR#:  [id number] DATE OF BIRTH:  04/14/1966  DATE OF PROCEDURE:  01/09/2014  PREOPERATIVE DIAGNOSES: 1.  Complication AV dialysis graft.  2.  End-stage renal disease.  3.  Superior vena cava syndrome.   POSTOPERATIVE DIAGNOSES:  1.  Complication AV dialysis graft.  2.  End-stage renal disease.  3.  Superior vena cava syndrome.   PROCEDURES PERFORMED: 1.  Contrast injection of left thigh AV graft.  2.  Percutaneous transluminal angioplasty of the venous anastomosis to 8 mm.   SURGEON: Hortencia Pilar, MD  SEDATION: Precedex drip. Continuous ECG, pulse oximetry and cardiopulmonary monitoring was performed throughout the entire procedure by the interventional radiology nurse. Total sedation time is 45 minutes.   ACCESS: A 6-French sheath, antegrade direction, left thigh AV graft.   CONTRAST USED: Isovue 35 mL.   FLUOROSCOPY TIME: 1.1 minutes.   INDICATIONS: Ms. Sauseda is a 49 year old woman who has been experiencing increasing bleeding post dialysis with worsening of her dialysis parameters. Noninvasive studies as well as physical examination suggested high-grade stenosis at the venous anastomosis. Risks and benefits for angiography with intervention are reviewed. All questions are answered. The patient agrees to proceed.   DESCRIPTION OF PROCEDURE: The patient is taken to special procedures and placed in the supine position. After adequate sedation on a Precedex drip is achieved, she is positioned supine and the left thigh is prepped and draped in sterile fashion. Appropriate timeout is called.   Then 1% lidocaine is infiltrated in the soft tissues overlying the graft, on the lateral limb, and a micropuncture needle used to access the graft in an antegrade direction, microwire followed by microsheath, J-wire followed by a 6-French sheath. Hand injection of contrast is then used to demonstrate the AV graft.   Then 3000 units of heparin is given. A  Magic torque wire is advanced through the stenosis and initially a 5 x 4 [place] balloon is used to angioplasty the area. Inflation is for 1 minute. Follow-up imaging demonstrates that it is still undersized and a 6 x 6 Lutonix balloon is then advanced across and angioplasty is performed to 14 atmospheres for 3 minutes. Follow-up imaging demonstrates, although significantly improved, remains somewhat undersized and therefore an 8 x 6 balloon is advanced across the lesion and balloon angioplasty is performed using the balloon inflated to 14 atmospheres for 2 minutes. Follow-up imaging now demonstrates that the vein appears to match the diameter of the graft perfectly. Central imaging demonstrates the common femoral, external and common iliac veins as well as the inferior vena cava are widely patent.   Pursestring suture is placed, sheath is removed, and there are no immediate complications.   INTERPRETATION: Initial views of the AV graft demonstrate the graft itself is in good condition. There are no hemodynamically significant strictures or stenoses within the graft nor is there any significant aneurysmal degeneration. At the venous anastomosis, there is a string sign and later in the case the central veins are all noted to be widely patent.   Following angioplasty, beginning at 5 then 6 then 8 mm, there is now resolution of the stenosis at the venous anastomosis.   SUMMARY: Successful salvage of left thigh AV graft.   ____________________________ Katha Cabal, MD ggs:sb D: 01/09/2014 09:42:25 ET T: 01/09/2014 10:47:28 ET JOB#: [id number]  cc: Katha Cabal, MD, <Dictator> Joyice Faster. [place], MD Katha Cabal MD ELECTRONICALLY SIGNED 01/30/2014 12:26
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
## 2014-12-22 NOTE — Discharge Summary (Signed)
PATIENT NAME:  [name], [name] MR#:  [id number] DATE OF BIRTH:  01-18-1966  DATE OF ADMISSION:  05/08/2014 DATE OF DISCHARGE:  05/10/2014  ADMITTING DIAGNOSIS:  Acute pancreatitis.   DISCHARGE DIAGNOSES:  1. Acute pancreatitis suspected due to hypertriglyceridemia as well as alcohol abuse, as well as gallstones.  2. Elevated transaminases, questionable alcohol related,  resolving.  3. End-stage renal disease.  4. Hypotension related to acute pancreatitis, resolved with IV fluids.   5. Diabetes mellitus with hemoglobin A1c 8.3.  6. Obesity.  7. Cholelithiasis.  8. Hypertension.   9. Hyperlipidemia.  10. Hypertriglyceridemia.   DISCHARGE CONDITION: Stable.   DISCHARGE MEDICATIONS: The patient is to continue midodrine 10 mg 3 times daily, gabapentin 200 mg p.o. in the morning and 100 mg p.o. at bedtime, Sensipar 60 mg p.o. daily, doxycycline 100 mg p.o. twice daily to continue according to prior scheduling, aspirin 81 mg p.o. daily, Dialyvite 1 tablet once daily, Renvela 800 mg 5 tablets 3 times daily with meals, NovoLog 20 units 3 times daily before meals, insulin Lantus 60 units subcutaneously at bedtime, this is new dose, pantoprazole 40 mg p.o. daily, omega 3 polyunsaturated fatty acids 1,200 mg oral capsule 1 capsule twice daily.   HOME OXYGEN: None.   DIET: 2 grams salt, low-fat, low-cholesterol, carbohydrate-controlled diet, hemodialysis diet, and 1500 mL fluid restriction, regular consistency.   ACTIVITY LIMITATIONS: As tolerated.   FOLLOWUP APPOINTMENT: With primary care physician in 2 days after discharge.   CONSULTANTS: Care management, social work, Dr. Wynelle Link, Dr. Michela Pitcher.    RADIOLOGIC STUDIES: Ultrasound of abdomen limited survey 05/08/2014 showed cholelithiasis with contracted gallbladder and positive Murphy sign, appearance was nonspecific but compatible with acute cholecystitis in the appropriate clinical setting, diffuse hepatic fatty infiltration was also noted. Chest  x-ray portable single view 05/09/2014 showed cardiomegaly and mild pulmonary vascular prominence, mild bibasilar atelectasis.   HISTORY OF PRESENT ILLNESS: The patient is a 49 year old Caucasian female with past medical history significant for history of end-stage renal disease, who is on hemodialysis, who presented to the hospital with complaints of abdominal pain, nausea, vomiting, as well as diarrhea. Please refer to Dr. Clarita Leber admission on 05/09/2014. On arrival to the hospital the patient's temperature was 98.1, pulse was 76, respiration rate was 18, blood pressure 122/61, saturation was 98% on room air. Physical exam revealed tenderness in the epigastric area as well as right upper quadrant to palpation. She had a Murphy sign which was positive, but no hepatosplenomegaly was noted. No other abnormalities were found on  physical exam. The patient's laboratory data done in the Emergency Room on 05/09/2014 revealed markedly elevated glucose level to 570, BUN and creatinine were 51 and 7.40, the patient's sodium was 135, otherwise BMP was unremarkable. Lipase level was 754. Liver enzymes revealed albumin level of 3.3, alkaline phosphatase 162, AST as well as ALT were 55 and 83 respectively. The patient's troponin level was less than 0.02. TSH was normal at 2.78. CBC showed a white blood cell count of 5.5, hemoglobin was 14.5, platelet count 137,000. MCV was high at 108.  Absolute neutrophil count was 3.5 which was within normal limits. The patient's EKG showed normal sinus rhythm with right-sided deviation, possible anterior infarct which was already cited in February of 2014, prolonged QTc to 524 milliseconds, and questionable changes in anterior leads, as well as nonspecific ST-T changes. Chest x-ray was unremarkable. Ultrasound of abdomen showed cholelithiasis with contracted gallbladder. The patient was seen by Dr. Michela Pitcher on 05/08/2014 before admission who  however, felt that the patient very likely has  biliary pancreatitis. He felt that the patient would not be the best candidate for surgical intervention, however in the event that she is interested in surgery he recommended to follow up with him as  outpatient. The patient was admitted to the medical service. She was started on therapy for pancreatitis and improved over the next few days. By 05/10/2014 her vital signs were stable and she had no abdominal pain. Her lipase level also returned back to normal and liver enzymes improved. Upon further discussion it appears the patient has been drinking alcohol and this could also explain the patient's liver enzyme abnormality, as well as fatty liver disease noted on her ultrasound. We also checked the patient's triglyceride level which was also found to be elevated at 452. It was felt that the patient's pancreatitis very likely is combination of multiple issues including hypertriglyceridemia, also alcohol, as well as gallstones. Since the patient's liver enzymes improved and total bilirubin remained stable the patient was initiated on p.o. on 05/10/2014, which was advanced to full liquid diet. The patient wanted to get home as soon as possible, so we felt that she is stable to be discharged home. She is being discharged to her primary care physician who is unfortunately unknown, who is to follow up with her hypertriglyceridemia as well as gallstone problems. In regards to end-stage renal disease, nephrologist was consulted and the patient  was continued on hemodialysis while she was in the hospital.   The patient was noted to be slightly hypotensive while in the hospital requiring intermittent IV fluid administration.   In regards to diabetes mellitus, the patient's hemoglobin A1c was found to be 8.3, so her insulin Lantus was advanced to 60 units subcutaneously at bedtime. It is recommended to follow the patient's hemoglobin A1c as an outpatient as well as closely follow her blood glucose levels since insulin  advancement may improve her hypertriglyceridemia as well and decrease her  risks of recurrent pancreatitis.   In regards to obesity the patient was advised to lose weight, if possible.    For cholelithiasis the patient is to follow up with surgeon if she develops any other symptoms.    For hypertension the patient is to continue her outpatient management.   The patient is being discharged in stable condition with the abovementioned medications and followup. On the day of discharge the patient's vital signs, temperature was 98.2, pulse was 72, respiration rate was 20, blood pressure 105/68, saturation was 96% on room air at rest.   TIME SPENT:  40 minutes.     ____________________________ Katharina Caper, MD rv:bu D: 05/10/2014 16:38:14 ET T: 05/10/2014 21:10:23 ET JOB#: [id number]  cc: Katharina Caper, MD, <Dictator> Unknown cc Giavana Rooke MD ELECTRONICALLY SIGNED 05/23/2014 14:26

## 2014-12-22 NOTE — H&P (Signed)
PATIENT NAME:  [name], [name] MR#:  [id number] DATE OF BIRTH:  December 16, 1965  DATE OF ADMISSION:  05/08/2014  PRIMARY CARE PHYSICIAN: Non local.  REFERRING PHYSICIAN: Dr. Gladstone Pih   CHIEF COMPLAINT: Abdominal pain, nausea, vomiting, and diarrhea.   HISTORY OF PRESENT ILLNESS: [name] is a 49 year old female with history of end-stage renal disease on hemodialysis and diabetes mellitus, who was having diarrhea for the last 2 days. Yesterday, she started to experience severe pain in the epigastric area, 10 out of 10 in intensity. Also, this is associated with generalized body aches. Concerning this, she came to the Emergency Department. On workup in the Emergency Department, the patient was found to a contracted gallbladder and cholelithiasis with a positive Murphy sign. Concerning this, we consulted general surgery who felt that this deep abdominal pain was more from the pancreatitis; they felt she was not a surgical candidate.    PAST MEDICAL HISTORY:  1.  Diabetes mellitus, insulin-dependent.  2.  Cholelithiasis.  3.  Previous history of pancreatitis.  4.  End-stage renal disease on hemodialysis.  5.  Hypertension.   PAST SURGICAL HISTORY: None.   ALLERGIES:  1.  WELLBUTRIN.  2.  TYLENOL.  3.  PERCOCET.  4.  AMITRIPTYLINE.  5.  BENADRYL.  6.  MORPHINE.   HOME MEDICATIONS:  1.  Sensipar 60 mg once a day.  2.  Renvela 5 tablets 3 times a day.  3.  NovoLog 20 units 3 times a day.  4.  Midodrine 10 mg 3 times a day.  5.  Lantus 50 units once a day.  6.  Gabapentin 100 mg 2 capsules in the evening.  7.  Dialyvite 1 tablet once a day.  8.  Aspirin 81 mg once a day.   SOCIAL HISTORY: No history of smoking. Drinks alcohol occasionally. Denies using any illicit drugs.   FAMILY HISTORY:  1.  Diabetes mellitus.  2.  Hypertension.  3.  Heart problems.   REVIEW OF SYSTEMS:  CONSTITUTIONAL: Generalized weakness.  EYES: No change in vision.  ENT: No change in hearing.   RESPIRATORY: No cough or shortness.  CARDIOVASCULAR: No chest pain or palpations.  GASTROINTESTINAL: No nausea, vomiting, or abdominal pain.  GENITOURINARY: No dysuria or hematuria.  HEMATOLOGIC: No easy bruising or bleeding.  SKIN: No rashes or lesions.  MUSCULOSKELETAL: Has joint pains and aches.  NEUROLOGIC: No weakness or numbness in any part of the body.   PHYSICAL EXAMINATION:  GENERAL: This is a well-built, well-nourished, age-appropriate female lying down in the bed, not in distress.  VITAL SIGNS: Temperature 98.1, pulse 76, blood pressure 122/61, respiratory rate of 18, oxygen saturation 98% on room air.  HEENT:  Normocephalic, atraumatic. No scleral icterus. Conjunctivae normal. Pupils equal and react to light. Mucous membranes moist. No pharyngeal erythema.  NECK: Supple. No lymphadenopathy. No JVD. No carotid bruit.  CHEST: No focal tenderness.  LUNGS: Bilaterally clear to auscultation.  HEART: S1, S2 regular. No murmurs are heard.  ABDOMEN: Bowel sounds present. Soft. Has tenderness in the epigastric area in the right upper quadrant. Tenderness to palpation. Murphy sign positive. Could not appreciate any hepatosplenomegaly.  EXTREMITIES: No pedal edema. Pulses 2+.  NEUROLOGIC: The patient is alert, oriented to place, person, and time. Cranial nerves II through XII intact. Motor 5/5 in the upper and lower extremities.   LABORATORY DATA: Complete metabolic panel shows an alkaline phosphatase of 162, ALT 83, AST 55, lipase 754, glucose 470.   CBC shows a WBC of  5.5, hemoglobin 14.5, platelet count of 137,000. The rest of all the values were within normal limits.   ASSESSMENT AND PLAN: [name] is a 48 year old female who comes to the Emergency Department with acute pancreatitis, most likely gallstone induced, also the possibility that this is alcohol-induced.   1.  Acute pancreatitis. Continue with intravenous fluids. Pain management. Seen by general surgery who felt that  she was not a candidate for any surgery at this time. They will continue to follow up with lipase.  2.  Cholelithiasis with cholecystitis. The patient is started on Zosyn.  3.  Diabetes mellitus, poorly controlled. Continue with home dose of Lantus of 50 units.  4.  End-stage renal disease on hemodialysis Monday/Wednesday/Friday.  5.  Keep the patient on deep vein thrombosis prophylaxis with heparin.    ____________________________ Susa Griffins, MD pv:MT D: 05/09/2014 04:12:50 ET T: 05/09/2014 05:15:29 ET JOB#: [id number]  cc: Susa Griffins, MD, <Dictator> Clerance Lav Machai Desmith MD ELECTRONICALLY SIGNED 05/16/2014 21:35

## 2014-12-22 NOTE — H&P (Signed)
PATIENT NAME:  [name], [name] MR#:  [id number] DATE OF BIRTH:  1966/08/26  DATE OF ADMISSION:  03/13/2014  PRIMARY CARE PHYSICIAN: She just fired her last primary care physician so she does not have one.   CHIEF COMPLAINT: Hyperkalemia.   HISTORY OF PRESENT ILLNESS: This is a 49 year old female with a history of diabetes and end-stage renal disease on hemodialysis Monday, Wednesday and Friday who presented today for an outpatient procedure. She underwent a lower extremity PTA. Prior to the procedure, her potassium was elevated. Repeat potassium still shows elevation so Dr. Gilda Crease asked the hospitalist to admit the patient for hyperkalemia. The patient is currently sedated. She just got out of her procedure. She was on Precedex. Her husband is at bedside.  REVIEW OF SYSTEMS: Unable to obtain as the patient is sedated still from effects of Precedex.   PAST MEDICAL HISTORY:  1.  Endstage renal disease on hemodialysis Monday, Wednesday and Friday.  2.  Diabetes with neuropathy.  3.  GERD.  4.  Peripheral vascular disease.  5.  Orthostatic hypotension.  PAST SURGICAL HISTORY:  1.  Left graft and numerous grafts in bilateral arms with thrombectomies. 2.  Lumpectomy that was benign. 3.  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  4.  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  5.  Burn with skin graft on the left hand.  6.  Today the patient had PTA to her left lower extremity.   ALLERGIES: AMITRIPTYLINE, BENADRYL, MORPHINE, PERCOCET, TYLENOL, WELLBUTRIN.   MEDICATIONS:  1.  Aspirin 81 mg daily.  2.  Dialyvite 1 tablet daily.  3.  Doxycycline 100 mg b.i.d.  4.  Gabapentin 100 mg 2 tablets b.i.d.  5.  Lantus 50 units at bedtime.  6.  Midodrine 10 mg t.i.d.  7.  NovoLog 70/30 20 units t.i.d.  8.  Renvela 800 mg 5 tablets t.i.d.  9.  Sensipar 60 mg daily.  SOCIAL HISTORY: No tobacco, alcohol or IV drug use. She lives with her husband.   FAMILY HISTORY: Positive for history of COPD, Alpha I  antitrypsin, CAD and hypertension.   PHYSICAL EXAMINATION:  VITAL SIGNS: Pulse 119, 97% on 2 liters, blood pressure 108/53, respirations 18, afebrile.  GENERAL: The patient has just been taken off the Precedex, not in acute distress. HEENT: Head is atraumatic. Pupils are 3 mm bilaterally and reactive to light Oropharynx is clear without any exudates noted.  HEART: Regular rate and rhythm. No murmurs, gallops, or rubs. PMI is not displaced.  LUNGS: Clear to auscultation without crackles, rales, rhonchi or wheezing. Normal to percussion.  ABDOMEN: Bowel sounds positive. Nontender and nondistended. Hard to appreciate organomegaly due to body habitus. EXTREMITIES: No clubbing, cyanosis or edema.  SKIN: She has tawny skin on her left lower extremity. She has a very small well-healed ulcer on the left foot, on the dorsal part of the left foot.  NEUROLOGIC: Unable to obtain and good neuro exam as the patient is sedated. Babinski sign is downgoing.   DIAGNOSTIC DATA: Laboratory: Blood sugar 248.   PH 7.34, pCO2 54, pO2 91. This is on 2 liters nasal cannula.  Potassium 6.7. This was repeated and was still 2.7. Earlier her labs are as follows: Sodium 133, potassium 6, chloride 91, bicarb 28, BUN 59, creatinine 7.09, glucose 198.   EKG: Still pending.   ASSESSMENT AND PLAN: A 49 year old female who went in for an outpatient procedure for left lower extremity PTA who subsequently has hyperkalemia from her end-stage renal disease.  1.  Hyperkalemia. I have asked for a stat EKG, which is still pending. We will follow up on this. For now I am giving her insulin and D50 and Kayexalate when she awakes, if her potassium is still elevated. We will repeat a potassium 1 hour from insulin and dextrose. Nephrology has been consulted for dialysis.  2.  End-stage renal disease, on hemodialysis. The patient will likely need dialysis today due to her hyperkalemia. I spoke with Dr Thedore Mins and the patient will have dialysis  today.  3.  Diabetes. We will continue her outpatient medications in addition to ADA diet and sliding scale insulin.  4.  Orthostatic hypotension. We will continue midodrine.   5.  Peripheral vascular disease. The patient may need Plavix at discharge. It does not appear on her home medications that she is taking Plavix at this time. Further recommendations per Dr. Gilda Crease.  CODE STATUS: FULL.  TIME SPENT: Approximately 45 minutes.    ____________________________ Janyth Contes. Juliene Pina, MD spm:sb D: 03/13/2014 16:01:57 ET T: 03/13/2014 16:30:27 ET JOB#: [id number]  cc: Karsyn Rochin P. Juliene Pina, MD, <Dictator> Janyth Contes Jolanta Cabeza MD ELECTRONICALLY SIGNED 03/13/2014 17:20

## 2014-12-22 NOTE — Op Note (Signed)
PATIENT NAME:  [name], [name] MR#:  [id number] DATE OF BIRTH:  Dec 08, 1965  DATE OF PROCEDURE:  03/13/2014.  PREOPERATIVE DIAGNOSIS:  1.  Atherosclerotic occlusive disease, bilateral lower extremities, with ulceration of the left foot.  2.  End-stage renal disease requiring hemodialysis.  3.  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  4.  Diabetes.  5.  Coronary artery disease.   POSTOPERATIVE DIAGNOSES:  1.  Atherosclerotic occlusive disease, bilateral lower extremities, with ulceration of the left foot.  2.  End-stage renal disease requiring hemodialysis.  3.  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  4.  Diabetes.  5.  Coronary artery disease.   PROCEDURES PERFORMED:  1.  Abdominal aortogram.  2.  Left lower extremity distal runoff, 3rd order catheter placement.  3.  Additional 3rd order catheter placement.  4.  Percutaneous transluminal angioplasty of the left posterior tibial artery to 3 mm.  5.  Percutaneous transluminal angioplasty of the left anterior tibial artery to 2.5 mm.   SURGEON: Katha Cabal, M.D.   SEDATION: Precedex drip with supplemental fentanyl. Continuous ECG, pulse oximetry and cardiopulmonary monitoring is performed throughout the entire procedure by the interventional radiology nurse. Total sedation time is 1.25 hours.   ACCESS: A 6 French sheath right common femoral artery.   FLUOROSCOPY TIME: 12.8 minutes.   CONTRAST USED: Isovue 70 mL.   INDICATIONS: Ms. Haith is a 49 year old woman with multiple medical problems who presented to the office with worsening ulceration of her left foot. Risks and benefits for angiography and intervention for limb salvage were reviewed. All questions were answered. The patient agrees to proceed.   DESCRIPTION OF PROCEDURE: The patient is taken to special procedures and placed in the supine position. After adequate sedation is achieved both groins are prepped and draped in sterile  fashion. Ultrasound is placed in a sterile sleeve. Ultrasound is utilized secondary to lack of appropriate landmarks and to avoid vascular injury. Under direct ultrasound visualization access to the common femoral artery is identified. It is echolucent and pulsatile indicating patency. Images recorded and a Micropuncture needle is used. Microwire followed by Microsheath, J-wire followed by a 5 French sheath and 5 French pigtail catheter. The pigtail catheter is positioned at the level of T12 and an AP projection of the aorta is obtained.   The pigtail catheter is then repositioned to above the bifurcation and an RAO projection of the pelvis is obtained. Stiff angled Glidewire and pigtail catheter are used to cross the aortic bifurcation. The catheter is negotiated down to the distal external iliac where an LAO projection of the groin is obtained. Wire is negotiated into the SFA and the pigtail catheter is then negotiated into the SFA and imaging of the above-knee SFA and popliteal is obtained. Wire is then reintroduced and a straight 125 Slip-Cath catheter is exchanged for the pigtail catheter. Catheter is negotiated down to the level of the tibial plateau within the popliteal artery and distal runoff is obtained. Diffuse tibial vessel disease is noted and 5000 units of heparin is given. The sheath is then exchanged for a 6 [place] and using the straight Slip-Cath catheter and the angled Glidewire, wire and catheter are negotiated into the posterior tibial. Hand injection of contrast within the posterior tibial demonstrates proximal disease distally; the artery appears to be relatively spared and fills the lateral plantar which fills the pedal arch. A 0.014 Spartacore wire is then introduced and a 3 x 100 balloon is advanced  across the lesions. It is inflated to 12 atmospheres for 2 minutes. Followup angiography utilizing the catheter with a Tuohy-Borst demonstrates an excellent result. No evidence of  dissection and less than 5% residual stenosis.   The catheter is then repositioned to above the origin of the anterior tibial, angled Glidewire and catheter are then negotiated into the anterior tibial. Hand injection of contrast demonstrates occlusion of the anterior tibial in its distal one-third but reconstitution of the dorsalis pedis. It does fill the pedal arch. A Spartacore wire would not cross, but a v18 wire crossed the  occlusion quite nicely and subsequently a 2.5 x 8 balloon is advanced across the lesion and inflated to 12 atmospheres for 2.5 minutes. Followup imaging through the catheter using a Tuohy-Borst demonstrates an excellent result. There is now less than 10% residual stenosis and rapid flow of contrast into the dorsalis pedis.   The catheter and wire are then removed and a 0.035 Magic Torque wire is reinserted and the sheath is pulled back into the right external iliac system. Oblique view is obtained and a StarClose device deployed. There are no immediate complications.   INTERPRETATION: The abdominal aorta is opacified with a bolus injection of contrast. It is relatively spared from atherosclerotic disease. It is smooth with minimal calcifications. This is true of the common and external iliacs bilaterally, as well. Renal arteries are present but there are no nephrograms noted consistent with her long-standing renal failure.   The left common femoral is patent. The femoral to femoral AV loop graft is patent. There is moderate hyperplasia at the venous portion but there is filling of the femoral vein and iliac vein. There are no hemodynamically significant lesions within the graft. Previously placed stents within the graft are widely patent as well.   The profunda and the SFA are patent, although diffusely diseased. There are no hemodynamically significant stenoses and this is true of the popliteal, as well. Tibial vessels demonstrated another story. The anterior tibial is diffusely  diseased and occludes in its distal portion. The dorsalis pedis is reconstituted filling the pedal arch. Peroneal is occluded by its midpoint and does not contribute any flow to the foot. The posterior tibial has diffuse disease within its proximal one-third but then distally it is relatively spared and fills the lateral plantar. Posterior tibial is dominant tibial vessel to the foot.   Following angioplasty to 3 mm in the posterior tibial and 2.5 mm in the anterior tibial there is now recanalization of both these arteries filling the foot.   SUMMARY: Successful revascularization of the tibial vessels in a complicated patient with atherosclerotic changes and ulcerations of the foot.    ____________________________ Katha Cabal, MD ggs:lt D: 03/14/2014 07:49:37 ET T: 03/14/2014 08:44:26 ET JOB#: [id number]  cc: Katha Cabal, MD, <Dictator> Katha Cabal MD ELECTRONICALLY SIGNED 03/27/2014 12:22

## 2014-12-22 NOTE — Consult Note (Signed)
CHIEF COMPLAINT and HISTORY:  Subjective/Chief Complaint Malfunctioning/ Clotted AV Graft- left lower extremity   History of Present Illness Pleasant 48yof h/o ESRD receiving HD MWF via Left femoral AV graft. She has had numerous procedures in the past. Her last HD was Wednesday without incident. 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:    Stenting of lower extremities:   ALLERGIES:  Allergies:  Morphine: Unknown  Percocet 10/325: N/V/Diarrhea  Wellbutrin: Hallucinations  Tylenol: Hypotension  Amitriptyline: Other  Benadryl: Other  Family and Social History:  Family History Non-Contributory   Place of Living Home   Review of Systems:  Subjective/Chief Complaint Clotted left Femoral AV graft   Fever/Chills No   Cough No   Sputum No   Abdominal Pain No   Diarrhea No   Constipation No   Nausea/Vomiting No   SOB/DOE No   Chest Pain No   Telemetry Reviewed NSR   Dysuria No   Tolerating Diet Yes   Physical Exam:  GEN no acute distress   HEENT PERRL   NECK supple   RESP normal resp effort   CARD regular rate   VASCULAR ACCESS AV graft present  No bruit, no thrill  Left leg   ABD soft   EXTR negative edema   SKIN No rashes   NEURO cranial nerves intact   ASSESSMENT AND PLAN:  Assessment/Admission Diagnosis Clotted left femoral AV graft   [place] reviewed: thrombosed left AV graft, anastomosis not clearly visualized. No evidence of DVT.   Plan Requires declot possible angioplasty/stenting/revision of Left femoral AV graft. Patients potassium currently 6.2  Extensive discussion with patient, I advised her  that I would prefer to do the declot today so she could recieve HD as soon as possible. She stated that she prefered that Dr. Delana Meyer only perform interventions. I explained that she would require admission and monitoring and medical treatment of her potassium.  I will arrange for surgical intervention by Dr. Delana Meyer on Monday.  Patient expressed understanding.   Electronic Signatures: Esco, Mariann Barter (MD)  (Signed 26-Dec-15 16:38)  Authored: Chief Complaint and History, PAST MEDICAL/SURGICAL HISTORY, ALLERGIES, Family and Social History, Review of Systems, Physical Exam, Assessment and Plan   Last Updated: 26-Dec-15 16:38 by Evaristo Bury (MD)

## 2014-12-22 NOTE — Consult Note (Signed)
PATIENT NAME:  [name], [name] MR#:  [id number] DATE OF BIRTH:  Oct 25, 1965  DATE OF CONSULTATION:  05/08/2014  PRIMARY CARE PHYSICIAN: Nonlocal.   CONSULTING PHYSICIAN:  Micheline Maze, MD  ADMITTING PHYSICIAN: Prime.   CHIEF COMPLAINT: Diarrhea, nausea, and abdominal pain.   BRIEF HISTORY: [name] is a 49 year old woman with multiple severe medical problems including insulin-dependent diabetes and end-stage renal disease currently on hemodialysis, who presents to the Emergency Room with sudden onset of diarrhea, nausea, and vomiting this evening. She went out to dinner in celebration of an anniversary and went to a [employer]. She has had difficulty with diarrhea on multiple occasions in the past and so wanted to avoid eating anything that might prompt her to need use of the bathroom. She had a large alcoholic drink and several minutes later developed diarrhea. While she was having diarrhea, she got lightheaded, felt as though she was going to pass out, and then began to be extremely nauseated and vomited multiple times. She presented by rescue squad to the Emergency Room. In the ED, she was noted to have some right upper quadrant and midepigastric abdominal tenderness.   LABORATORY AND RADIOLOGICAL DATA: In the Emergency Room revealed elevated blood glucose at 570, BUN 51, creatinine 7.4, potassium 5, CO2 of 22, alkaline phosphatase 162, and normal bilirubin. Lipase was slightly elevated at 754. White blood cell count was normal. Hemoglobin was 14.5. Platelet count was 137. Troponin was normal. Ultrasound was performed, which revealed multiple small gallstones and shrunken gallbladder with no evidence of any gallbladder wall distention. No pericholecystic fluid was identified. The patient did have a positive sonographic Murphy sign. The surgical service was consulted to consider possible acute cholecystitis.   PAST MEDICAL HISTORY: She has multiple medical problems, mostly  stemming from her previous juvenile-onset diabetes. She has been told in the past that she has gallstones and had an episode of pancreatitis in the distant past treated at [facility] in [place]. She denies any history of hepatitis or yellow jaundice. She has a history of peptic ulcer disease.   PAST SURGICAL HISTORY: She has not had any previous abdominal surgery. She has had multiple vascular procedures for vascular insufficiency.   SOCIAL HISTORY: She is not a cigarette smoker. She is wheelchair confined.   MEDICATIONS: Outlined in her recent admission note from July. She is not currently on any anticoagulation other than aspirin.   PHYSICAL EXAMINATION: GENERAL: She is alert, lying comfortably in bed with no significant pain at the present time. When she presented, she felt her pain scale was a 4; currently, she says she is at a 1 or 2.  VITAL SIGNS: Temperature is 98.1, blood pressure 130/60, and heart rate is 75 and regular.  HEENT: No scleral icterus. No pupillary abnormalities. No facial deformities.  NECK: Supple, nontender. Midline trachea.  CHEST: Clear with no adventitious sounds. She has normal pulmonary excursion.  CARDIAC: Very distant cardiac sounds, but I cannot hear any murmurs or gallop rhythms.  ABDOMEN: Markedly distended with some mild midepigastric and right upper quadrant tenderness. She has no rebound or guarding. She has hypoactive bowel sounds. No hernias are noted other than a small umbilical hernia.  EXTREMITIES: Access in her left groin. She does not have good distal pulses, and she has significant decreased range of motion in her lower extremities.  PSYCHIATRIC: Normal orientation. Normal affect.   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 She is certainly not a  good surgical candidate, and surgery in this situation would be preventative rather than therapeutic. I would recommend admission to the medical service,  control of her diabetes and renal disease, and re-evaluation. CT scan may be of some benefit. Bowel rest may also assist in treating her pancreatitis. I suspect she would not become a good surgical candidate in any situation, but in the event that she does have interest in surgery, we would be happy to discuss with her as an outpatient.    ____________________________ Micheline Maze, MD rle:nb D: 05/09/2014 00:01:44 ET T: 05/09/2014 00:15:55 ET JOB#: [id number]  cc: Rodena Goldmann III, MD, <Dictator> Rodena Goldmann MD ELECTRONICALLY SIGNED 05/09/2014 6:42

## 2014-12-24 LAB — CULTURE, BLOOD (SINGLE)

## 2014-12-25 LAB — WOUND AEROBIC CULTURE

## 2014-12-26 DIAGNOSIS — N186 End stage renal disease: Secondary | ICD-10-CM | POA: Insufficient documentation

## 2014-12-26 DIAGNOSIS — Z992 Dependence on renal dialysis: Secondary | ICD-10-CM

## 2014-12-26 DIAGNOSIS — A419 Sepsis, unspecified organism: Secondary | ICD-10-CM | POA: Insufficient documentation

## 2014-12-26 NOTE — Op Note (Signed)
PATIENT NAME:  [name], [name] MR#:  [id number] DATE OF BIRTH:  [birth year]  DATE OF PROCEDURE:  08/27/2014  PREOPERATIVE DIAGNOSES:  1.  End-stage renal disease.  2.  Clotted left thigh arteriovenous graft.  3.  Peripheral arterial disease, status post previous interventions.  4.  Morbid obesity.   POSTOPERATIVE DIAGNOSES:   1.  End-stage renal disease.  2.  Clotted left thigh arteriovenous graft.  3.  Peripheral arterial disease, status post previous interventions.  4.  Morbid obesity.   PROCEDURE:   1.  Ultrasound guidance in both an antegrade and retrograde fashion to the left thigh arteriovenous graft.  2.  Left lower extremity shuntogram and central venogram.  3.  Catheter directed thrombolysis with 4 mg of tPA throughout the left thigh graft.  4.  Mechanical Rheolytic thrombectomy with the AngioJet AVX catheter in the left thigh arteriovenous graft.  5.  Fogarty embolectomy for residual arterial plug of the left thigh arteriovenous graft.  6.  Percutaneous transluminal angioplasty of arterial access site with 7 mm diameter angioplasty balloon for residual thrombus causing flow limitation.  7.  Percutaneous transluminal angioplasty of venous anastomosis for stenosis with 7 and 8 mm diameter angioplasty balloon.  8.  Percutaneous transluminal angioplasty of venous access site with 7 mm diameter angioplasty balloon.  9.  Viabahn covered stent placement to venous access site with 8 mm diameter x 15 cm length Vyvanse stent for residual stenosis and thrombus after angioplasty in the separate distinct lesion from venous anastomosis arterial access site.   SURGEON: Algernon Huxley, M.D.   ANESTHESIA: Local with moderate conscious sedation.   BLOOD LOSS: 50 mL.  CONTRAST USED:  40 mL Visipaque.   FLUOROSCOPY TIME: Approximately 5 minutes.   INDICATION FOR PROCEDURE: This is a 49 year old female with extensive medical history and long-standing history of end-stage renal disease. She  has now got a left thigh AV graft which has clotted. She had a temporary dialysis catheter placed yesterday due to hyperkalemia. She was brought down today for treatment of her graft to try to salvage this. Risks and benefits were discussed. Informed consent was obtained.   DESCRIPTION OF PROCEDURE: The patient was brought to the vascular suite. The left thigh was sterilely prepped and draped and a sterile surgical field was created. Due to the pulseless nature of the graft, ultrasound was used to visualize and access the graft in both an antegrade and retrograde fashion crossing and permanent images recorded. A 6 French sheaths were placed and she was given 4000 units of intravenous heparin. I then took Magic torque wires and placed them into the femoral artery and into the femoral vein and delivered 4 mg of tPA with the AngioJet AVX catheter in a power pulse spray fashion. This was allowed to dwell for approximately 10 minutes. Imaging performed prior to this showed the graft to be completely thrombosed. There appeared to be a tight stenosis in the venous anastomosis and the venous portion of the graft appeared small. There was a change in caliber near the venous access site and it was unclear if this was an area of previous graft revision, which was re-stenosed or if this was just a stenotic and thrombosed graft from multiple access. Reading back her previous operative summary, there did not appear to be a revision here but she has had her care elsewhere as well. Mechanical Rheolytic thrombectomy was performed throughout the graft and a residual arterial plug was seen. A Five Fogarty embolectomy balloon was help  pull out the arterial plug which cleared the arterial anastomosis. The arterial access site near where we had accessed with the antegrade sheath had a large blob of thrombus. After mechanical Rheolytic thrombectomy was unsuccessful in clearing this, a 7 mm diameter angioplasty balloon was inflated in  this area with a good angiographic completion result and resolution of the stenosis and thrombus in this area. The graft still had thrombus at the venous access site and there was still significant stenosis at the venous anastomosis. These were separate and distinct areas. The retrograde sheath could now be removed as we had cleared the arterial side of the graft. Further mechanical Rheolytic thrombectomy was performed on the venous side, and the venous anastomosis and venous access sites. However, the thrombus and stenosis at the venous access site and the stenosis at the venous anastomosis persisted.  I took through the antegrade sheath with retrograde sheath removed, a 7 mm diameter angioplasty balloon was inflated. There was already a stent near the apex of the graft and towards the venous access site I ballooned from this stent up through the venous access site. A very tight waist was seen which resolved with angioplasty and the venous anastomosis in a separate inflation was treated also initially with a 7 mm diameter angioplasty balloon. This resulted in residual thrombus and stenosis. The venous access site and was not really significantly improved. The venous anastomosis improved after angioplasty alone, although there was still some residual stenosis left, upsized to a 7 French sheath in exchanged for an 0.018 wire. I used an 8 mm diameter x 15 cm in length Viabahn covered stent to cover the venous access site to exclude the thrombus and resolve the stenosis. This was deployed with a residual waist seen and I upsized to an 8 mm balloon to post dilate this area at the venous access site. This resulted in markedly improved flow. The venous anastomosis was also treated separately with an 8 mm diameter angioplasty balloon with a separate inflation beyond the venous access stent.  This resulted only about a 20% residual stenosis at the venous anastomosis. The graft now had excellent flow clinically. The flow was  markedly improved on angiography without significant residual stenosis or thrombus and I elected to terminate the procedure after imaging of the iliac veins and IVC and seeing these were patent as well. The second sheath was removed, 4-0 Monocryl pursestring suture was placed, pressure was held and sterile dressing was placed. The patient tolerated the procedure well and was taken to the recovery room in stable condition.     ____________________________ Algernon Huxley, MD jsd:at D: 08/27/2014 10:03:17 ET T: 08/27/2014 11:31:50 ET JOB#: [id number]  cc: Algernon Huxley, MD, <Dictator> Algernon Huxley MD ELECTRONICALLY SIGNED 09/03/2014 14:13

## 2014-12-26 NOTE — Op Note (Signed)
PATIENT NAME:  [name], [name] MR#:  [id number] DATE OF BIRTH:  [birth year]  DATE OF PROCEDURE:  08/26/2014  PREOPERATIVE DIAGNOSES: 1. End-stage renal disease requiring hemodialysis.  2. Complication of dialysis device with thrombosis of left thigh access.  3. Superior vena cava syndrome.  POSTOPERATIVE DIAGNOSES: 1. End-stage renal disease requiring hemodialysis.  2. Complication of dialysis device with thrombosis of left thigh access.  3. Superior vena cava syndrome.  PROCEDURE PERFORMED: Ultrasound-guided insertion of temporary dialysis catheter, right femoral approach.   PROCEDURE PERFORMED BY: Katha Cabal, MD.   DESCRIPTION OF PROCEDURE: The patient is in her hospital room. She is positioned supine and her right groin is prepped and draped in a sterile fashion. Ultrasound is placed in a sterile sleeve. Femoral vein is identified. It is echolucent and compressible indicating patency. Image is recorded for the permanent record. Under real-time visualization, the vein is accessed after 1% lidocaine has been infiltrated in the soft tissues. A micropuncture needle is utilized. Microwire followed by micro sheath. Amplatz Super Stiff wire is then advanced followed by the dilators and then the Trialysis catheter. All three lumens aspirate and flush easily. The catheter is packed with sterile saline as the patient is going to dialysis immediately. The catheter is secured to the skin of the thigh with 2-0 nylon, and a sterile dressing is applied. The patient tolerated the procedure without incident. There are no immediate complications.    ____________________________ Katha Cabal, MD ggs:jh D: 08/26/2014 13:50:56 ET T: 08/26/2014 15:30:05 ET JOB#: [id number]  cc: Katha Cabal, MD, <Dictator> Katha Cabal MD ELECTRONICALLY SIGNED 09/11/2014 17:32

## 2014-12-26 NOTE — H&P (Signed)
PATIENT NAME:  [name], [name] MR#:  [id number] DATE OF BIRTH:  [birth year]  DATE OF ADMISSION:  08/25/2014  PRIMARY PHYSICIAN: Nonlocal.   EMERGENCY ROOM PHYSICIAN: Ahmed Prima, MD  NEPHROLOGIST: Tama High, MD  CHIEF COMPLAINT: Clotted dialysis access.   HISTORY OF PRESENT ILLNESS: A 49 year old female with a history of hypertension, diabetes, ESRD on hemodialysis, last dialysis was on Wednesday. Today, she went to have regular hemodialysis and noted to have a clotted AV graft so she was sent in here for declotting. The patient was seen by the on-call vascular surgeon, Dr. Lorenso Courier, but she preferred Dr. Delana Meyer to do declotting. After explaining to her several times, she refused to have intervention. Instead, she said she will wait until Monday to have evaluation and declotting by Dr. Delana Meyer. She does have  elevated potassium of 6.2, so we are going to admit her for hyperkalemia. The patient received so far 10 units of regular insulin IV, D50 at 1 amp,  calcium gluconate 1 amp, Kayexalate 30 grams, and sodium bicarbonate 1 amp IV push. The patient is asymptomatic. EKG is unremarkable. The patient refused to have intervention here, instead wanted to stay and see if potassium comes, then she wants to go home and have intervention by Dr. Delana Meyer on Monday. We contacted Dr. Delana Meyer. He said he cannot do the declotting on the weekend and the patient refused to have intervention by on-call vascular physician, Dr. Lorenso Courier.   PAST MEDICAL HISTORY: Significant for hypertension, ESRD on hemodialysis, diabetes mellitus type 2 with diabetic neuropathy. The patient has a history of numerous malfunctioning  accesses. Finally, she had a left femoral AV graft. Also includes history of previous pancreatitis, cholelithiasis.    ALLERGIES: AMITRIPTYLINE, BENADRYL, MORPHINE, PERCOCET, TYLENOL, AND WELLBUTRIN.   SOCIAL HISTORY: The patient has no history of smoking, but thinks alcohol occasionally. No drugs.    FAMILY HISTORY: Significant for diabetes, hypertension, and heart problems to her parents.     MEDICATIONS: Aspirin 81 mg p.o. daily, doxycycline 100 mg p.o. b.i.d., Neurontin 100 mg p.o. 2 tablets in the morning and 1 tablet at night, Lantus 60 units at bedtime, midodrine 10 mg p.o. t.i.d., NovoLog 20 units t.i.d. with meals, omega-3 fatty acid 1 capsule b.i.d., pantoprazole 40 mg p.o. daily, Sensipar 60 mg p.o. daily, Renvela 800 mg 5 tablets p.o. t.i.d.   REVIEW OF SYSTEMS:  CONSTITUTIONAL: The patient denies any fever or fatigue.  EYES: No blurred vision.  ENT: No tinnitus. No epistaxis. No difficulty swallowing.  RESPIRATORY: No cough. No wheezing.  CARDIOVASCULAR: No chest pain or orthopnea. No PND.  GASTROINTESTINAL: No nausea. No vomiting. No abdominal pain.  GENITOURINARY: No dysuria.  ENDOCRINE: No polyuria or polydipsia.  HEMATOLOGIC: No anemia.  INTEGUMENTARY: No skin rashes.  MUSCULOSKELETAL: The patient has no joint pain.  NEUROLOGIC: No numbness or weakness.  PSYCHIATRIC: No anxiety or insomnia.   PHYSICAL EXAMINATION:  VITAL SIGNS: The patient's temperature is 98.8, heart rate 68, blood pressure 157/71, saturation is 96% on room air.  GENERAL: She is an alert, awake, oriented 49 year old female in oriented who is obese, not in distress, answering questions appropriately.  HEAD: Atraumatic, normocephalic.  EYES: Pupils equal, reacting to light. Extraocular movements are intact.  ENT: No tinnitus. No congestion. No turbinate hypertrophy. No oropharyngeal erythema.  NECK: Supple. No JVD. No carotid bruit. Normal range of motion.  RESPIRATORY: Good respiratory effort. Clear to auscultation. No wheeze. No rales.  CARDIOVASCULAR: S1, S2 regular. No murmurs. The patient's pulses are  equal in femoral and dorsalis pedis.  GASTROINTESTINAL: Abdomen is soft, nontender, nondistended. Bowel sounds present.  MUSCULOSKELETAL: Able to move all extremities x 4. The patient's left  femoral AV graft was seen.  SKIN: Inspection is normal.  LYMPHATICS: No lymphadenopathy.  NEUROLOGIC: Cranial nerves II to XII intact. Power 5/5 in upper and lower extremities. Sensory is intact. DTRs 2+ bilaterally.  PSYCHIATRIC: Mood and affect are within normal limits.   LABORATORY DATA: Electrolytes: Sodium 133, potassium 6.2, chloride 94, bicarbonate 25, BUN 124, creatinine 9.64, glucose 168. The patient's WBC is 7.5, hemoglobin 14.4, hematocrit 46, platelets 134,000. Ultrasound of the left leg showed dialysis graft in the left thigh which is thrombosed. EKG shows normal sinus rhythm with no ST-T changes.   ASSESSMENT AND PLAN:  1.  This patient is a 49 year old female patient with severe hyperkalemia secondary to arteriovenous graft clotting. The patient refused declotting by the vascular surgeon on-call, and she is going to be admitted for hyperkalemia. The patient already received Kayexalate, insulin with dextrose, bicarbonate, and calcium gluconate. So, we are going to recheck the potassium in 2 hours and see how she does. The patient would like to go home if the potassium normalizes and have declotting done by Dr. Delana Meyer on Monday. The patient has end-stage renal disease.  2.  End-stage renal disease, on hemodialysis on Monday, Wednesday, Friday.  3.  Hypertension. Blood pressure is controlled.  4.  Diabetes mellitus type 2. Resume her home medications.  5.  Gastroesophageal reflux disease. Continue proton pump inhibitor.  TIME SPENT: 55 minutes.  Discussed the plan with the patient. Explained to the patient that she needs declotting and the patient refused to have declotting by the on-call vascular surgery. Initially, she refused to even stay in the hospital, but after explaining to her that hyperkalemia is dangerous and can cause cardiac arrest, she finally accepted to stay. Time spent: 55 minutes as I mentioned.    ____________________________ Epifanio Lesches,  MD sk:ts D: 08/25/2014 17:48:21 ET T: 08/25/2014 18:34:01 ET JOB#: [id number]  cc: Epifanio Lesches, MD, <Dictator> Epifanio Lesches MD ELECTRONICALLY SIGNED 09/24/2014 6:18
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
## 2014-12-30 NOTE — Discharge Summary (Signed)
PATIENT NAME:  [name], [name] MR#:  [id number] DATE OF BIRTH:  1966-05-06  ADMITTING DIAGNOSIS:  Clotted dialysis access.  DISCHARGE DIAGNOSES:  1.  End-stage renal disease, Monday, Wednesday, Friday schedule. 2.  Clotted arteriovenous graft, status post intervention on 08/27/2014, by Dr. Lucky Cowboy. 3.  Hyperkalemia due to clotted arteriovenous graft and inability to undergo hemodialysis.  4.  Status post temporary hemodialysis catheter placement through right femoral approach by Dr. Delana Meyer on 08/26/2014, and hemodialysis via this temporary catheter on 08/26/2014.  5.  Hemodialysis via arteriovenous graft 08/27/2014 as well as 08/28/2014.   6.  Chronic hypotension on midodrine therapy for that.  7.  Diabetes mellitus, type 2, insulin-dependent with hemoglobin A1c 7.8. 8.  Diabetic neuropathy, wheelchair bound.  9.  Thrombocytopenia.  10.  Anemia of chronic kidney disease. 11.  Secondary hyperparathyroidism.   DISCHARGE CONDITION: Stable.   DISCHARGE MEDICATIONS: The patient is to continue: 1.  Midodrine 10 mg 3 times daily. 2.  Sensipar 60 mg p.o. daily. 3.  Aspirin 81 mg p.o. daily. 4.  Dialyvite 1 tablet once daily. 5.  Renvela 800 mg 5 tablets which will be 4000 mg orally 3 times daily with meals. 6.  NovoLog 20 units subcutaneously 3 times daily before meals.  7.  Doxycycline 100 mg p.o. daily. 8.  Gabapentin 100 mg capsule 2 capsules once daily and 1 capsule at bedtime.  9.  Lantus 50 units subcutaneously at bedtime.  10.  Omega-3 polyunsaturated fatty acids 1 gram once daily.  HOME OXYGEN:  None.  DIET: Low-salt, low-fat, low-cholesterol, carbohydrate-controlled diet, with 1500 mL fluid restriction.   DIET CONSISTENCY: Regular.   ACTIVITY LIMITATIONS: As tolerated.   FOLLOWUP APPOINTMENT: With hemodialysis center as previously scheduled. Dr. Holley Raring in the next few days after discharge.   CONSULTANTS: Care management, social work, Katha Cabal, MD, Tama High,  MD, Algernon Huxley, MD, Biola A. Esco, MD, vascular surgery.   RADIOLOGIC STUDIES: Ultrasound of left lower extremity, 08/25/2014 revealed a dilated graft in the left thigh, which was thrombosed. This was last visualized as patient in July 2015 and  thrombosis of uncertain chronicity. Correlation with physical exam and patient's history is recommended. No definite deep vein thrombosis was noted.   HISTORY OF PRESENT ILLNESS: The patient is a 49 year old female with a history of end-stage renal disease, who is on hemodialysis, Mondays, Wednesdays, Fridays schedule via AVG in the left lower extremity thigh. Presents to the hospital with inability to get hemodialysis due to thrombosed AVG.  Please refer to Dr. Veryl Speak and P on 08/25/2014.   On arrival to the hospital the patient's temperature was 98.8, pulse was 68, her respiration rate was 18, blood pressure 167/71. Saturation was 96% on room air.   Physical exam was unremarkable except for left femoral AVG graft was thrombosed as thrill and bruit were not felt. Otherwise, all physical exam was unremarkable.   LABORATORY DATA: Done on arrival to the hospital, 08/25/2014, revealed glucose level of 168, BUN and creatinine were 124 and at 9.64, respectively. Sodium was 133, potassium 6.2, otherwise BMP was unremarkable. The patient's liver enzymes revealed alkaline phosphatase of 218, otherwise liver enzymes were normal. CBC: White blood cell count was 7.5, hemoglobin was 14.4, platelet count was 134,000, with high MCV at 106. Coagulation panel was unremarkable.    EKG showed po quality, normal sinus rhythm, cannot rule out anterior infarct which was already cited before February 2014, prolonged   QTc to 528 msec and no  other abnormalities were found.   The patient was admitted to the hospital for further evaluation. Consultation with vascular surgeon, Dr. Lorenso Courier was obtained. Dr. Lorenso Courier recommended to establish temporary HD catheter and get hemodialysis  as soon as possible. The patient preferred Dr. Delana Meyer to perform intervention and the patient was agreeable for admission to the hospital. The patient underwent a temporary hemodialysis catheter placement on 08/26/2014, and underwent hemodialysis on the 08/26/2014. The patient waited until 08/27/2014 until she was able to have her operation done on her left side AVG by Dr. Lucky Cowboy. Dr. Lucky Cowboy performed left lower extremity shuntogram as well as left venogram, catheter directed thrombolysis with 4 mg of tPA throughout the left side graft, mechanical thrombectomy, Fogarty thrombectomy for residual arterial plug  on the left side arteriovenous graft as well as percutaneous transluminal angioplasty of venous anastomosis  with 7 mm diameter angioplasty balloon for residual thrombus causing low flow limitation.  Percutaneous transluminal angioplasty of venous anastomosis for stenosis with 7 and 8 mm diameter angioplasty balloon. Percutaneous transluminal angioplasty of venous access site with 7 mm diameter angioplasty balloon and Viabahn covered stent placement for venous access site with 8 mm diameter x 15 cm length Vyvanse stent for residual stenosis and thrombus after angioplasty and separate distinct lesion from venous anastomosis at the old access site.   Postoperatively, the patient had hemodialysis a few times because of fluid overloaded state. She was constantly seen by Dr. Anthonette Legato who is nephrologist. The patient was felt to be stable. Discharged home on 08/28/2014. On the day of discharge, temperature was 97.2, pulse was 73, respirations were 18, blood pressure 117/76, saturation was 93%-95% on room air at rest.   Of note, the patient's potassium level normalized by the day of discharge. The patient's phosphorus level was checked on the day of discharge, 08/28/2014, was found to be 5.8.  Hemoglobin A1c was also checked and was found to be 7.8. The patient was advised to continue her usual doses of  medications. Regarding thrombocytopenia, we recommend it to be rechecked as outpatient in approximately 1 week after discharge to ensure its stability. The patient's hemoglobin A1c which was checked on the 08/26/2014 was negative. Parathyroid hormone level was 294, which was high.  TIME SPENT: 40 minutes.   ____________________________ Theodoro Grist, MD rv:LT D: 09/03/2014 15:42:00 ET T: 09/03/2014 20:41:01 ET JOB#: [id number]  cc: Tama High, MD Theodoro Grist, MD, <Dictator>  [place] MD ELECTRONICALLY SIGNED 09/13/2014 10:01
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
## 2015-01-03 ENCOUNTER — Ambulatory Visit (INDEPENDENT_AMBULATORY_CARE_PROVIDER_SITE_OTHER): Payer: Medicare Other | Admitting: Internal Medicine

## 2015-01-03 ENCOUNTER — Encounter: Payer: Self-pay | Admitting: Internal Medicine

## 2015-01-03 VITALS — BP 106/72 | HR 70 | Temp 98.4°F

## 2015-01-03 DIAGNOSIS — A419 Sepsis, unspecified organism: Secondary | ICD-10-CM

## 2015-01-03 DIAGNOSIS — J189 Pneumonia, unspecified organism: Secondary | ICD-10-CM | POA: Diagnosis not present

## 2015-01-03 DIAGNOSIS — J1100 Influenza due to unidentified influenza virus with unspecified type of pneumonia: Secondary | ICD-10-CM | POA: Diagnosis not present

## 2015-01-03 DIAGNOSIS — N186 End stage renal disease: Secondary | ICD-10-CM | POA: Diagnosis not present

## 2015-01-03 DIAGNOSIS — R197 Diarrhea, unspecified: Secondary | ICD-10-CM

## 2015-01-03 DIAGNOSIS — Z992 Dependence on renal dialysis: Secondary | ICD-10-CM

## 2015-01-03 LAB — COMPREHENSIVE METABOLIC PANEL
ALBUMIN: 3.5 g/dL (ref 3.5–5.2)
ALT: 18 U/L (ref 0–35)
AST: 20 U/L (ref 0–37)
Alkaline Phosphatase: 344 U/L — ABNORMAL HIGH (ref 39–117)
BILIRUBIN TOTAL: 0.6 mg/dL (ref 0.2–1.2)
BUN: 19 mg/dL (ref 6–23)
CO2: 30 mEq/L (ref 19–32)
Calcium: 8.2 mg/dL — ABNORMAL LOW (ref 8.4–10.5)
Chloride: 95 mEq/L — ABNORMAL LOW (ref 96–112)
Creatinine, Ser: 4.8 mg/dL (ref 0.40–1.20)
GFR: 10.29 mL/min — CL (ref >60.00)
Glucose, Bld: 180 mg/dL — ABNORMAL HIGH (ref 70–99)
POTASSIUM: 3.2 meq/L — AB (ref 3.5–5.1)
SODIUM: 138 meq/L (ref 135–145)
Total Protein: 7.2 g/dL (ref 6.0–8.3)

## 2015-01-03 LAB — CBC
HCT: 38.9 % (ref 36.0–46.0)
HEMOGLOBIN: 12.3 g/dL (ref 12.0–15.0)
MCHC: 31.7 g/dL (ref 30.0–36.0)
MCV: 95 fl (ref 78.0–100.0)
PLATELETS: 246 10*3/uL (ref 150.0–400.0)
RBC: 4.1 Mil/uL (ref 3.87–5.11)
RDW: 20.2 % — ABNORMAL HIGH (ref 11.5–15.5)
WBC: 7.1 10*3/uL (ref 4.0–10.5)

## 2015-01-03 NOTE — Progress Notes (Signed)
Pre visit review using our clinic review tool, if applicable. No additional management support is needed unless otherwise documented below in the visit note. 

## 2015-01-03 NOTE — Patient Instructions (Signed)
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
## 2015-01-04 ENCOUNTER — Telehealth: Payer: Self-pay

## 2015-01-04 NOTE — Telephone Encounter (Signed)
PLEASE NOTE: All timestamps contained within this report are represented as [place] Standard Time. CONFIDENTIALTY NOTICE: This fax transmission is intended only for the addressee. It contains information that is legally privileged, confidential or otherwise protected from use or disclosure. If you are not the intended recipient, you are strictly prohibited from reviewing, disclosing, copying using or disseminating any of this information or taking any action in reliance on or regarding this information. If you have received this fax in error, please notify us immediately by telephone so that we can arrange for its return to [place]. Phone: [phone], Toll-Free: [phone], Fax: [phone] Page: 1 of 2 Call Id: [id number] [address] Patient Name: [name] Gender: Female DOB: 1965-12-26 Age: 49 Y 1 M 4 D Return Phone Number: Address: City/State/Zip: [place] Client [facility] - Client Client Site [place] Physician [place], [place] Type Call Call Type Triage / Clinical Caller Name [facility] lab Relationship To Patient Provider Return Phone Number Unavailable Chief Complaint Lab Result (Critical) Initial Comment Caller states : [facility] [place] [facility] CB# [phone] has a critical lab on Creatine and GFR level Nurse Assessment Nurse: Martyn Ehrich, RN, [place] Ice Date/Time (Eastern Time): 01/03/2015 5:41:53 PM Is there an on-call provider listed? ---Yes Please list name of person reporting value (Lab Employee) and a contact number. ---Hope Please document the following items: Lab name Lab value (read back to lab to verify) Reference range for lab value Date and time blood was drawn ---PT's creatinine is 4.8 mg/dl (critical high) NL range is 0.4 to 1.2, GFR is 10.29 calculation (critical low), NL is > than 60 ml/ min Please collect  the patient contact information from the lab. (name, phone number and address) ---collected at 2:06 pm, [phone] List any special notes provided by lab. ---no hemolysis - this is first visit for pt (no previous results) [facility] is across from hospital on [address] in [place] Notes Nurse Date/Time ([place] Time) [place]. Time Eilene Ghazi Time) Disposition Final User 01/03/2015 5:50:22 PM Called On-Call Provider [place], RN, [facility] 0/01/2375 2:83:15 PM Lab Call Martyn Ehrich, RN, [place] Ice Reason: see above 01/03/2015 5:51:27 PM Call Completed Martyn Ehrich, RN, [place] Ice 09/06/6158 7:37:10 PM Clinical Call Yes Martyn Ehrich, RN, [place] Ice After Care Instructions Given Call Event Type User Date / Time Description PLEASE NOTE: All timestamps contained within this report are represented as [place] Standard Time. CONFIDENTIALTY NOTICE: This fax transmission is intended only for the addressee. It contains information that is legally privileged, confidential or otherwise protected from use or disclosure. If you are not the intended recipient, you are strictly prohibited from reviewing, disclosing, copying using or disseminating any of this information or taking any action in reliance on or regarding this information. If you have received this fax in error, please notify us immediately by telephone so that we can arrange for its return to [place]. Phone: [phone], Toll-Free: [phone], Fax: [phone] Page: 2 of 2 Call Id: [id number] Comments User: Daphene Calamity, RN Date/Time Eilene Ghazi Time): 01/03/2015 5:48:18 PM Lab ordered by Webb Silversmith, MD Paging DoctorName Phone DateTime Result/Outcome Message Type Notes Unice Cobble [phone] 01/03/2015 5:50:22 PM Called On Call Provider - Reached Doctor Paged Unice Cobble 01/03/2015 5:50:52 PM Spoke with On Call - General Message Result Reported labs to Dr. Linna Darner and he states he will  look at patients chart  and follow up

## 2015-01-04 NOTE — Telephone Encounter (Signed)
ESRD pt.

## 2015-01-07 ENCOUNTER — Telehealth: Payer: Self-pay

## 2015-01-07 NOTE — Telephone Encounter (Signed)
a1c has been checked by her nephrologists during dialysis.

## 2015-01-07 NOTE — Telephone Encounter (Signed)
Spoke to [name] and advised per Dr Deborra Medina; will contact nephrology office to obtain results

## 2015-01-07 NOTE — Telephone Encounter (Signed)
Cherly Anderson PT with [facility] left v/m requesting cb with A1C done within last 90 days. I do not see an A1c in last 90 days.Please advise.

## 2015-01-10 ENCOUNTER — Encounter: Payer: Self-pay | Admitting: Family Medicine

## 2015-01-10 ENCOUNTER — Ambulatory Visit (INDEPENDENT_AMBULATORY_CARE_PROVIDER_SITE_OTHER): Payer: Medicare Other | Admitting: Family Medicine

## 2015-01-10 VITALS — BP 114/63 | HR 79 | Temp 97.7°F

## 2015-01-10 DIAGNOSIS — E11359 Type 2 diabetes mellitus with proliferative diabetic retinopathy without macular edema: Secondary | ICD-10-CM

## 2015-01-10 DIAGNOSIS — E1122 Type 2 diabetes mellitus with diabetic chronic kidney disease: Secondary | ICD-10-CM

## 2015-01-10 DIAGNOSIS — N189 Chronic kidney disease, unspecified: Secondary | ICD-10-CM

## 2015-01-10 DIAGNOSIS — R0781 Pleurodynia: Secondary | ICD-10-CM

## 2015-01-10 DIAGNOSIS — Y95 Nosocomial condition: Secondary | ICD-10-CM

## 2015-01-10 DIAGNOSIS — J189 Pneumonia, unspecified organism: Secondary | ICD-10-CM | POA: Diagnosis not present

## 2015-01-10 DIAGNOSIS — E0849 Diabetes mellitus due to underlying condition with other diabetic neurological complication: Secondary | ICD-10-CM | POA: Diagnosis not present

## 2015-01-10 DIAGNOSIS — A419 Sepsis, unspecified organism: Secondary | ICD-10-CM | POA: Insufficient documentation

## 2015-01-10 DIAGNOSIS — E119 Type 2 diabetes mellitus without complications: Secondary | ICD-10-CM | POA: Insufficient documentation
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
## 2015-01-10 NOTE — Progress Notes (Signed)
Pre visit review using our clinic review tool, if applicable. No additional management support is needed unless otherwise documented below in the visit note. 

## 2015-01-14 ENCOUNTER — Telehealth: Payer: Self-pay | Admitting: *Deleted

## 2015-01-14 NOTE — Telephone Encounter (Signed)
Ok to give verbal order as requested. 

## 2015-01-14 NOTE — Telephone Encounter (Signed)
[name] with [facility] left a voicemail requesting an order to bring out an incentive spirometry to use for her because of recent pneumonia.Marland Kitchen

## 2015-01-14 NOTE — Telephone Encounter (Signed)
Lm on [name]'s vm and provided verbal order

## 2015-01-21 DIAGNOSIS — L89529 Pressure ulcer of left ankle, unspecified stage: Secondary | ICD-10-CM

## 2015-01-21 DIAGNOSIS — N186 End stage renal disease: Secondary | ICD-10-CM

## 2015-01-21 DIAGNOSIS — L89152 Pressure ulcer of sacral region, stage 2: Secondary | ICD-10-CM | POA: Diagnosis not present

## 2015-01-21 DIAGNOSIS — J189 Pneumonia, unspecified organism: Secondary | ICD-10-CM

## 2015-02-05 ENCOUNTER — Encounter: Payer: Self-pay | Admitting: Family Medicine

## 2015-02-05 ENCOUNTER — Ambulatory Visit (INDEPENDENT_AMBULATORY_CARE_PROVIDER_SITE_OTHER): Payer: Medicare Other | Admitting: Family Medicine

## 2015-02-05 VITALS — BP 128/76 | HR 72 | Temp 97.8°F

## 2015-02-05 DIAGNOSIS — S98139A Complete traumatic amputation of one unspecified lesser toe, initial encounter: Secondary | ICD-10-CM | POA: Insufficient documentation

## 2015-02-05 DIAGNOSIS — J189 Pneumonia, unspecified organism: Secondary | ICD-10-CM | POA: Diagnosis not present

## 2015-02-05 DIAGNOSIS — Y95 Nosocomial condition: Secondary | ICD-10-CM

## 2015-02-05 DIAGNOSIS — S98139S Complete traumatic amputation of one unspecified lesser toe, sequela: Secondary | ICD-10-CM | POA: Diagnosis not present

## 2015-02-05 NOTE — Assessment & Plan Note (Signed)
>  25 minutes spent in face to face time with patient, >50% spent in counselling or coordination of care Filled out forms AND returned to pt for her to fax (pt requested this). She is high risk and would benefit from custom inserts and diabetic shoes.

## 2015-02-05 NOTE — Progress Notes (Signed)
Pre visit review using our clinic review tool, if applicable. No additional management support is needed unless otherwise documented below in the visit note. 
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
## 2015-02-05 NOTE — Assessment & Plan Note (Signed)
Symptoms improving. Repeat CXR today to confirm resolution given severity of her illness. The patient indicates understanding of these issues and agrees with the plan.

## 2015-02-12 ENCOUNTER — Telehealth: Payer: Self-pay

## 2015-02-12 NOTE — Telephone Encounter (Signed)
[name] PT with [facility] left v/m; pt had missed visit last week and this week. Pt having knee problems and pt going to [facility] 02/12/15. Pt wants to find out what is going on with her knee before resuming PT. [name] does not require cb. This is FYI for Dr Deborra Medina.

## 2015-02-12 NOTE — Telephone Encounter (Signed)
Noted  

## 2015-02-14 ENCOUNTER — Telehealth: Payer: Self-pay | Admitting: Family Medicine

## 2015-02-14 NOTE — Telephone Encounter (Signed)
Attempted to contact pt; line busy. Form was not faxed from office, but was taken by pt and she faxed information to [employer], so we were unaware of what additional information was required. OV note faxed to [employer] as requested.

## 2015-02-14 NOTE — Telephone Encounter (Signed)
Pt called needing notes from her last foot exam. She was at [facility] trying to get new shoes and was unable due to notes not being sent over.  The fax number for [employer] is [phone].  Please call pt when records from visit are faxed, the pt states that she has been needing new shoes for over 6 mos.  Please call pt at home number, thanks

## 2015-02-15 ENCOUNTER — Telehealth: Payer: Self-pay | Admitting: Family Medicine

## 2015-02-15 NOTE — Telephone Encounter (Signed)
Andria Rhein from [facility] brought in a rx for pt eval for wheelchair that needs to be signed. You can reach [facility] at [phone] with any questions. The number to fax it back to Jackelyn Poling once done is [phone]. Placed papers on [place] desk.

## 2015-02-18 ENCOUNTER — Other Ambulatory Visit: Payer: Self-pay | Admitting: Orthopedic Surgery

## 2015-02-18 ENCOUNTER — Telehealth: Payer: Self-pay

## 2015-02-18 DIAGNOSIS — M25562 Pain in left knee: Secondary | ICD-10-CM

## 2015-02-18 DIAGNOSIS — Z7689 Persons encountering health services in other specified circumstances: Secondary | ICD-10-CM

## 2015-02-18 NOTE — Telephone Encounter (Signed)
Diabetic Bundle. I contacted the pt and advised A1C blood test is due. Pt declined to scheduled lab visit due to upcoming surgery. Pt stated she would call back to schedule.

## 2015-02-19 ENCOUNTER — Encounter (HOSPITAL_COMMUNITY): Payer: Self-pay | Admitting: *Deleted

## 2015-02-19 ENCOUNTER — Ambulatory Visit
Admission: RE | Admit: 2015-02-19 | Discharge: 2015-02-19 | Disposition: A | Discharge status: Home / Self Care | Payer: Medicare Other | Source: Ambulatory Visit | Attending: Orthopedic Surgery | Admitting: Orthopedic Surgery

## 2015-02-19 ENCOUNTER — Inpatient Hospital Stay (HOSPITAL_COMMUNITY)
Admission: RE | Admit: 2015-02-19 | Discharge: 2015-02-19 | Disposition: A | Discharge status: Home / Self Care | Payer: Medicare Other | Source: Ambulatory Visit

## 2015-02-19 DIAGNOSIS — M25562 Pain in left knee: Secondary | ICD-10-CM | POA: Diagnosis present

## 2015-02-19 DIAGNOSIS — M25862 Other specified joint disorders, left knee: Secondary | ICD-10-CM | POA: Insufficient documentation

## 2015-02-19 DIAGNOSIS — M898X5 Other specified disorders of bone, thigh: Secondary | ICD-10-CM | POA: Insufficient documentation

## 2015-02-19 MED ORDER — MUPIROCIN 2 % EX OINT: 1.0000 "application " | TOPICAL_OINTMENT | Freq: Once | CUTANEOUS | Status: DC

## 2015-02-19 NOTE — Pre-Procedure Instructions (Signed)
    [name]  02/19/2015      Your procedure is scheduled on Wednesday, June 22.  Report to [facility] at 11:00 A.M.   Call this number if you have problems the morning of surgery:  [phone]   Remember:  Do not eat food or drink liquids after midnight.  Take these medicines the morning of surgery with A SIP OF WATER :gabapentin (NEURONTIN),  midodrine (PROAMATINE), omeprazole (PRILOSEC).   Do not wear jewelry, make-up or nail polish.  Do not wear lotions, powders, or perfumes.    Do not shave 48 hours prior to surgery.    Do not bring valuables to the hospital.  [facility] is not responsible for any belongings or valuables.  Contacts, dentures or bridgework may not be worn into surgery.  Leave your suitcase in the car.  After surgery it may be brought to your room.  For patients admitted to the hospital, discharge time will be determined by your treatment team.  Patients discharged the day of surgery will not be allowed to drive home.   Name and phone number of your driver:   - Special instructions:  Review  [name] - Preparing For Surgery.  Please read over the following fact sheets that you were given. Pain Booklet, Coughing and Deep Breathing and Surgical Site Infection Prevention

## 2015-02-20 ENCOUNTER — Encounter (HOSPITAL_COMMUNITY): Admission: RE | Payer: Self-pay | Source: Ambulatory Visit

## 2015-02-20 ENCOUNTER — Ambulatory Visit (HOSPITAL_COMMUNITY): Admission: RE | Admit: 2015-02-20 | Payer: Medicare Other | Source: Ambulatory Visit | Admitting: Orthopedic Surgery

## 2015-02-20 HISTORY — DX: Other abnormalities of gait and mobility: R26.89

## 2015-02-20 HISTORY — DX: Burn of unspecified body region, unspecified degree: T30.0

## 2015-02-20 HISTORY — DX: Acute embolism and thrombosis of unspecified deep veins of unspecified lower extremity: I82.409

## 2015-02-20 HISTORY — DX: Hypotension, unspecified: I95.9

## 2015-02-20 SURGERY — ARTHROSCOPY, KNEE
Anesthesia: General | Laterality: Left

## 2015-02-26 ENCOUNTER — Telehealth: Payer: Self-pay | Admitting: Family Medicine

## 2015-02-26 NOTE — Telephone Encounter (Signed)
Orders faxed as requested.

## 2015-02-26 NOTE — Telephone Encounter (Signed)
Pt called, she is at [employer] and needs a specific rx It needs to say for extra depth diabetic shoes And custom molded inserts  Fax to [phone]  Pt is at [employer] now, waiting for correct rx, so that medicare will cover, thanks.

## 2015-02-26 NOTE — Telephone Encounter (Signed)
Rx written and in my box.

## 2015-03-14 ENCOUNTER — Ambulatory Visit: Payer: Medicare Other | Attending: Nephrology | Admitting: Physical Therapy

## 2015-03-14 ENCOUNTER — Encounter: Payer: Self-pay | Admitting: Physical Therapy

## 2015-03-14 DIAGNOSIS — R262 Difficulty in walking, not elsewhere classified: Secondary | ICD-10-CM | POA: Diagnosis not present

## 2015-03-15 NOTE — Therapy (Signed)
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 appropriate/safe use for wheelchair and seating for home use.    Time 1   Period Days   Status Achieved               Plan - 03/22/2015 1418    Clinical Impression Statement PT performed Wheelchair evaluation. See attached therapy note.          G-Codes - March 22, 2015 1156    Functional Assessment Tool Used transfer ability, clinical judgment   Functional Limitation Mobility: Walking and moving around   Mobility: Walking and Moving Around Current Status [phone]) At least 60 percent but less than 80 percent impaired, limited or restricted   Mobility: Walking and Moving Around Goal Status [id number]) At least 60 percent but less than 80 percent impaired, limited or restricted   Mobility: Walking and Moving Around Discharge  Status [phone]) At least 60 percent but less than 80 percent impaired, limited or restricted       Problem List Patient Active Problem List   Diagnosis Date Noted  . Amputation, toe, traumatic with complication 02/05/2015  . Diabetes 01/10/2015  . Diabetes mellitus 01/10/2015  . HAP (hospital-acquired pneumonia) 01/10/2015  . Rib pain on left side 01/10/2015  . Sepsis 01/10/2015  . Otitis, externa, infective 03/08/2014  . Personal history of colonic polyps - adenomas 02/15/2014  . ESRD (end stage renal disease) 08/15/2013  . Family history of ischemic heart disease 08/15/2013   . Nonspecific abnormal electrocardiogram (ECG) (EKG) 01/24/2013  . General medical exam 03/12/2011  . UNSPECIFIED HYPOTENSION 03/27/2010  . DIZZINESS 03/27/2010  . DIABETIC  RETINOPATHY 07/24/2009  . Diabetic neuropathy associated with diabetes mellitus due to underlying condition 07/24/2009  . MORBID OBESITY 07/24/2009  . ADJ DISORDER WITH MIXED ANXIETY & DEPRESSED MOOD 07/24/2009  . PULMONARY NODULE, SOLITARY 07/24/2009  . RENAL FAILURE 07/24/2009  . SECONDARY HYPERPARATHYROIDISM    Grier Rocher, SPT This entire session was performed under direct supervision and direction of a licensed therapist . I have personally read, edited and approve of the note as written.   Hopkins,Aaren Atallah, PT, DPT 03/15/2015, 12:03 PM  [place] [facility] MAIN [facility] SERVICES [address] [place], [place], [address] Phone: [phone]   Fax:  [phone]

## 2015-03-19 ENCOUNTER — Ambulatory Visit: Payer: Medicare Other

## 2015-04-16 ENCOUNTER — Ambulatory Visit (INDEPENDENT_AMBULATORY_CARE_PROVIDER_SITE_OTHER): Payer: Medicare Other | Admitting: Family Medicine

## 2015-04-16 ENCOUNTER — Ambulatory Visit (INDEPENDENT_AMBULATORY_CARE_PROVIDER_SITE_OTHER): Payer: Medicare Other | Admitting: Podiatry

## 2015-04-16 VITALS — BP 132/60 | HR 63 | Temp 98.1°F

## 2015-04-16 DIAGNOSIS — M79676 Pain in unspecified toe(s): Secondary | ICD-10-CM

## 2015-04-16 DIAGNOSIS — S98139S Complete traumatic amputation of one unspecified lesser toe, sequela: Secondary | ICD-10-CM

## 2015-04-16 DIAGNOSIS — L8990 Pressure ulcer of unspecified site, unspecified stage: Secondary | ICD-10-CM | POA: Diagnosis not present

## 2015-04-16 DIAGNOSIS — B351 Tinea unguium: Secondary | ICD-10-CM | POA: Diagnosis not present

## 2015-04-16 DIAGNOSIS — N186 End stage renal disease: Secondary | ICD-10-CM | POA: Diagnosis not present

## 2015-04-16 DIAGNOSIS — L97511 Non-pressure chronic ulcer of other part of right foot limited to breakdown of skin: Secondary | ICD-10-CM | POA: Diagnosis not present

## 2015-04-16 DIAGNOSIS — G8929 Other chronic pain: Secondary | ICD-10-CM

## 2015-04-16 DIAGNOSIS — Z993 Dependence on wheelchair: Secondary | ICD-10-CM | POA: Diagnosis not present

## 2015-04-16 DIAGNOSIS — E1149 Type 2 diabetes mellitus with other diabetic neurological complication: Secondary | ICD-10-CM

## 2015-04-16 DIAGNOSIS — E1140 Type 2 diabetes mellitus with diabetic neuropathy, unspecified: Secondary | ICD-10-CM

## 2015-04-16 DIAGNOSIS — M2040 Other hammer toe(s) (acquired), unspecified foot: Secondary | ICD-10-CM
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
## 2015-04-16 NOTE — Assessment & Plan Note (Addendum)
Agree with PT evaluation. Would benefit from tilt- cannot weight shift.  Cannot self propel or stand- power wheelchair is necessary.  >40 minutes spent in face to face time with patient, >50% spent in counselling or coordination of care

## 2015-04-16 NOTE — Assessment & Plan Note (Signed)
Would benefit from extra padded seat. She was unable to stand or get on exam table to evaluate wounds- referring her to wound center today to be seen urgently.

## 2015-04-16 NOTE — Progress Notes (Signed)
Subjective: 49 year old female presents the office they with concerns of thick, elongated toenails which she is unable to resolve. She denies any redness or drainage from the nail sites. She also states that she has developed a wound on the top of her right third toe which started irritation in shoes. She digit issues from biotech and she has since returned him to modifications. She denies any redness or drainage. She'll states that she has noticed the spot form on the side of her left big toe and the inside aspect of her left ankle which is in present for several months. She denies any increase in redness or any restraints. She denies any drainage or purulence from lesions. She denies any systemic complaints as fevers, chills, nausea vomiting. Denies any calf pain, chest pain, shortness of breath. No other complaints at this time.  She'll be going to the wound care center for a wound on her sacrum. She says is also showed the meniscus to the areas of her feet.  Objective: AAO X3, NAD;Presents in a wheelchair DP/PT pulses decreased Protective sensation decreased with Derrel Nip monofilament His previous indications of the right first and second toes on the left second and third toes. There is dried, peeling skin to bilateral feet. The nail the remaining digits are hypertrophic, dystrophic, brittle, elongated. There is no swelling erythema or drainage. There is subjective tenderness on nails on the right foot 3, 4, 5 on the left 1, 4, 5. On the dorsal aspect of the right third digit there is a small superficial ulceration. There is no swelling erythema, a sitting phalanx, fluctuance, crepitus, malodor, drainage. Pre-ulcerative lesions of the medial aspect left hallux and left medial ankle. Again there is no swelling erythema, a sitting cellulitis, fluctuance, crepitus, malodor, drainage. No other open lesions or pre-ulcer lesions identified. Hammertoe contractures are identified.no areas of  tenderness to bilateral lower extremity is. There is no pain with calf compression, swelling, warmth, erythema.  Assessment: 49 year old female with symptomatic onychomycosis, ulceration right third toe, pre-ulcerative lesions.  Plan: -Treatment options discussed including all alternatives, risks, and complications -Nail sharply debrided x6 without complication/bleeding. -Recommended and Levaquin and a bandage the right third toe daily. -Monitor the pre-ulcerative lesions. -Monitor for any clinical signs or symptoms of infection and directed to call the office immediately should any occur or go to the ER. -Follow-up of the wound care center. She would also like to follow up with me for the wounds.  I will see her back in 3 weeks unless there is a problem sooner.  Celesta Gentile, DPM

## 2015-04-16 NOTE — Progress Notes (Signed)
Pre visit review using our clinic review tool, if applicable. No additional management support is needed unless otherwise documented below in the visit note. 

## 2015-04-18 ENCOUNTER — Telehealth: Payer: Self-pay | Admitting: Family Medicine

## 2015-04-18 DIAGNOSIS — L905 Scar conditions and fibrosis of skin: Secondary | ICD-10-CM | POA: Insufficient documentation

## 2015-04-18 DIAGNOSIS — L910 Hypertrophic scar: Secondary | ICD-10-CM | POA: Insufficient documentation

## 2015-04-18 NOTE — Telephone Encounter (Signed)
[facility]bie from [facility] Fax copy of face to face encounter note from Aug 16.   Call back [phone]  New fax number is [phone].

## 2015-04-18 NOTE — Telephone Encounter (Signed)
Information faxed as requested.

## 2015-04-19 ENCOUNTER — Ambulatory Visit: Payer: Medicare Other | Admitting: Surgery

## 2015-04-23 ENCOUNTER — Encounter: Payer: Medicare Other | Attending: Surgery | Admitting: Surgery

## 2015-04-23 DIAGNOSIS — N186 End stage renal disease: Secondary | ICD-10-CM | POA: Diagnosis not present

## 2015-04-23 DIAGNOSIS — L89310 Pressure ulcer of right buttock, unstageable: Secondary | ICD-10-CM | POA: Diagnosis not present

## 2015-04-23 DIAGNOSIS — S61303A Unspecified open wound of left middle finger with damage to nail, initial encounter: Secondary | ICD-10-CM | POA: Diagnosis not present

## 2015-04-23 DIAGNOSIS — I2510 Atherosclerotic heart disease of native coronary artery without angina pectoris: Secondary | ICD-10-CM | POA: Insufficient documentation

## 2015-04-23 DIAGNOSIS — S61307A Unspecified open wound of left little finger with damage to nail, initial encounter: Secondary | ICD-10-CM | POA: Insufficient documentation

## 2015-04-23 DIAGNOSIS — E1122 Type 2 diabetes mellitus with diabetic chronic kidney disease: Secondary | ICD-10-CM | POA: Diagnosis not present

## 2015-04-23 DIAGNOSIS — X58XXXA Exposure to other specified factors, initial encounter: Secondary | ICD-10-CM | POA: Diagnosis not present

## 2015-04-23 DIAGNOSIS — E1140 Type 2 diabetes mellitus with diabetic neuropathy, unspecified: Secondary | ICD-10-CM | POA: Diagnosis not present

## 2015-04-23 DIAGNOSIS — G4733 Obstructive sleep apnea (adult) (pediatric): Secondary | ICD-10-CM | POA: Diagnosis not present

## 2015-04-23 DIAGNOSIS — S61301A Unspecified open wound of left index finger with damage to nail, initial encounter: Secondary | ICD-10-CM | POA: Insufficient documentation

## 2015-04-23 DIAGNOSIS — Z992 Dependence on renal dialysis: Secondary | ICD-10-CM | POA: Diagnosis not present

## 2015-04-23 DIAGNOSIS — Z794 Long term (current) use of insulin: Secondary | ICD-10-CM | POA: Diagnosis not present

## 2015-04-23 DIAGNOSIS — S61300A Unspecified open wound of right index finger with damage to nail, initial encounter: Secondary | ICD-10-CM | POA: Diagnosis not present

## 2015-04-24 NOTE — Progress Notes (Signed)
Moist: No Wound Preparation Ulcer Cleansing: Rinsed/Irrigated with Saline Topical Anesthetic Applied: None Treatment Notes Wound #10 (Right Sacrum) 1. Cleansed with: Clean wound with Normal Saline 3. Peri-wound Care: Skin Prep 4. Dressing Applied: Other dressing (specify in notes) 5. Secondary Dressing Applied Bordered Foam Dressing Notes betadine paint, BFD to sacrum only Electronic Signature(s) Signed: 04/23/2015 11:43:23 AM By: Montey Hora Entered By: Montey Hora on 04/23/2015 11:43:23 [name] ([id number]) -------------------------------------------------------------------------------- Wound Assessment Details Patient Name: [name] Date of Service: 04/23/2015 10:15 AM Medical Record Number: [id number] Patient Account Number: [id number] Date of Birth/Sex: 12-10-1965 (49 y.o. Female) Treating RN: Montey Hora Primary Care Physician: Arnette Norris Other Clinician: Referring Physician: Arnette Norris Treating Physician/Extender: Frann Rider in Treatment: 0 Wound Status Wound Number: 11 Primary Pressure Ulcer Etiology: Wound Location: Right Elbow Wound Open Wounding Event: Pressure Injury Status: Date Acquired: 12/04/2014 Comorbid Cataracts, Anemia, Type II Diabetes, Weeks Of Treatment: 0 History: End Stage Renal Disease, History of Clustered Wound: No Burn, Neuropathy Photos Photo Uploaded By: Gretta Cool, RN, BSN, Kim on 04/23/2015  17:07:17 Wound Measurements Length: (cm) 1.1 Width: (cm) 1 Depth: (cm) 0.1 Area: (cm) 0.864 Volume: (cm) 0.086 % Reduction in Area: 0% % Reduction in Volume: 0% Epithelialization: None Tunneling: No Undermining: No Wound Description Classification: Unstageable/Unclassified Wound Margin: Flat and Intact Exudate Amount: None Present Foul Odor After Cleansing: No Wound Bed Granulation Amount: None Present (0%) Exposed Structure Necrotic Amount: Large (67-100%) Fascia Exposed: No Necrotic Quality: Eschar Fat Layer Exposed: No Tendon Exposed: No Muscle Exposed: No Joint Exposed: No Kibby, Aryel R. ([id number]) Bone Exposed: No Limited to Skin Breakdown Periwound Skin Texture Texture Color No Abnormalities Noted: No No Abnormalities Noted: No Callus: No Atrophie Blanche: No Crepitus: No Cyanosis: No Excoriation: No Ecchymosis: No Fluctuance: No Erythema: No Friable: No Hemosiderin Staining: No Induration: No Mottled: No Localized Edema: No Pallor: No Rash: No Rubor: No Scarring: No Temperature / Pain Moisture Temperature: No Abnormality No Abnormalities Noted: No Dry / Scaly: No Maceration: No Moist: No Wound Preparation Ulcer Cleansing: Rinsed/Irrigated with Saline Topical Anesthetic Applied: None Treatment Notes Wound #11 (Right Elbow) 1. Cleansed with: Clean wound with Normal Saline 3. Peri-wound Care: Skin Prep 4. Dressing Applied: Other dressing (specify in notes) 5. Secondary Dressing Applied Bordered Foam Dressing Notes betadine paint, BFD to sacrum only Electronic Signature(s) Signed: 04/23/2015 11:43:40 AM By: Montey Hora Entered By: Montey Hora on 04/23/2015 11:43:40 [name] ([id number]) -------------------------------------------------------------------------------- Wound Assessment Details Patient Name: [name] Date of Service: 04/23/2015 10:15 AM Medical Record Number: [id number] Patient Account  Number: [id number] Date of Birth/Sex: Oct 30, 1965 (49 y.o. Female) Treating RN: Montey Hora Primary Care Physician: Arnette Norris Other Clinician: Referring Physician: Arnette Norris Treating Physician/Extender: Frann Rider in Treatment: 0 Wound Status Wound Number: 12 Primary Trauma, Other Etiology: Wound Location: Right Hand - 1st Digit Wound Open Wounding Event: Trauma Status: Date Acquired: 12/04/2014 Comorbid Cataracts, Anemia, Type II Diabetes, Weeks Of Treatment: 0 History: End Stage Renal Disease, History of Clustered Wound: No Burn, Neuropathy Photos Photo Uploaded By: Gretta Cool, RN, BSN, Kim on 04/23/2015 17:07:18 Wound Measurements Length: (cm) 1.2 Width: (cm) 1.3 Depth: (cm) 0.1 Area: (cm) 1.225 Volume: (cm) 0.123 % Reduction in Area: 0% % Reduction in Volume: 0% Epithelialization: None Tunneling: No Undermining: No Wound Description Classification: Partial Thickness Wound Margin: Flat and Intact Exudate Amount: Medium Exudate Type: Serous Exudate Color: amber Foul Odor After Cleansing: No Wound Bed Granulation Amount: Medium (34-66%) Exposed Structure Granulation Quality: Red Fascia  Wound Healing Center Program Date Initiated: 04/23/2015 Goal Status: Active Interventions: Provide education on orientation to the wound center Notes: Peripheral Neuropathy Nursing Diagnoses: Potential alteration in peripheral tissue perfusion (select prior to confirmation of  diagnosis) Goals: Patient/caregiver will verbalize understanding of disease process and disease management LAURAINE, CRESPO ([id number]) Date Initiated: 04/23/2015 Goal Status: Active Interventions: Assess signs and symptoms of neuropathy upon admission and as needed Notes: Pressure Nursing Diagnoses: Potential for impaired tissue integrity related to pressure, friction, moisture, and shear Goals: Patient will remain free from development of additional pressure ulcers Date Initiated: 04/23/2015 Goal Status: Active Interventions: Assess offloading mechanisms upon admission and as needed Notes: Wound/Skin Impairment Nursing Diagnoses: Impaired tissue integrity Goals: Ulcer/skin breakdown will have a volume reduction of 30% by week 4 Date Initiated: 04/23/2015 Goal Status: Active Ulcer/skin breakdown will have a volume reduction of 50% by week 8 Date Initiated: 04/23/2015 Goal Status: Active Ulcer/skin breakdown will have a volume reduction of 80% by week 12 Date Initiated: 04/23/2015 Goal Status: Active Ulcer/skin breakdown will heal within 14 weeks Date Initiated: 04/23/2015 Goal Status: Active Interventions: Assess patient/caregiver ability to obtain necessary supplies Assess ulceration(s) every visit Notes: WRIGLEY, WINBORNE ([id number]) Electronic Signature(s) Signed: 04/23/2015 11:50:09 AM By: Montey Hora Entered By: Montey Hora on 04/23/2015 11:50:08 [name] ([id number]) -------------------------------------------------------------------------------- Patient/Caregiver Education Details Patient Name: [name] Date of Service: 04/23/2015 10:15 AM Medical Record Number: [id number] Patient Account Number: [id number] Date of Birth/Gender: 1966/03/19 (49 y.o. Female) Treating RN: Montey Hora Primary Care Physician: Arnette Norris Other Clinician: Referring Physician: Arnette Norris Treating Physician/Extender: Frann Rider in Treatment:  0 Education Assessment Education Provided To: Patient and Caregiver Education Topics Provided Wound/Skin Impairment: Handouts: Other: wound care as ordered Methods: Demonstration, Explain/Verbal Responses: State content correctly Electronic Signature(s) Signed: 04/23/2015 11:51:08 AM By: Montey Hora Entered By: Montey Hora on 04/23/2015 11:51:08 [name] ([id number]) -------------------------------------------------------------------------------- Wound Assessment Details Patient Name: [name] Date of Service: 04/23/2015 10:15 AM Medical Record Number: [id number] Patient Account Number: [id number] Date of Birth/Sex: 06-May-1966 (49 y.o. Female) Treating RN: Montey Hora Primary Care Physician: Arnette Norris Other Clinician: Referring Physician: Arnette Norris Treating Physician/Extender: Frann Rider in Treatment: 0 Wound Status Wound Number: 10 Primary Pressure Ulcer Etiology: Wound Location: Right Sacrum Wound Open Wounding Event: Pressure Injury Status: Date Acquired: 04/01/2015 Comorbid Cataracts, Anemia, Type II Diabetes, Weeks Of Treatment: 0 History: End Stage Renal Disease, History of Clustered Wound: No Burn, Neuropathy Photos Photo Uploaded By: Gretta Cool, RN, BSN, Kim on 04/23/2015 17:06:43 Wound Measurements Length: (cm) 1.8 Width: (cm) 3.5 Depth: (cm) 0.1 Area: (cm) 4.948 Volume: (cm) 0.495 % Reduction in Area: 0% % Reduction in Volume: 0% Epithelialization: None Tunneling: No Undermining: No Wound Description Classification: Unstageable/Unclassified Wound Margin: Flat and Intact Exudate Amount: None Present Foul Odor After Cleansing: No Wound Bed Granulation Amount: None Present (0%) Exposed Structure Necrotic Amount: Large (67-100%) Fascia Exposed: No Necrotic Quality: Eschar Fat Layer Exposed: No Tendon Exposed: No Muscle Exposed: No Joint Exposed: No Deschepper, Joby R. ([id number]) Bone Exposed: No Limited  to Skin Breakdown Periwound Skin Texture Texture Color No Abnormalities Noted: No No Abnormalities Noted: No Callus: No Atrophie Blanche: No Crepitus: No Cyanosis: No Excoriation: No Ecchymosis: No Fluctuance: No Erythema: No Friable: No Hemosiderin Staining: No Induration: No Mottled: No Localized Edema: No Pallor: No Rash: No Rubor: No Scarring: No Temperature / Pain Moisture Temperature: No Abnormality No Abnormalities Noted: No Tenderness on Palpation: Yes Dry / Scaly: No Maceration: No  cushion, seat, etc.) 0 INTERVENTIONS - Wound Cleansing / Measurement X - Wound Imaging (photographs - any number of wounds) 1 5 []  - Wound Tracing (instead of photographs) 0 []  - Simple Wound Measurement - one wound 0 X - Complex Wound Measurement - multiple wounds 8 5 []  - Simple Wound Cleansing - one wound 0 X - Complex Wound Cleansing - multiple wounds 8 5 INTERVENTIONS - Wound Dressings X - Small Wound Dressing one or multiple wounds 8 10 []  - Medium Wound Dressing one or multiple wounds 0 []  - Large Wound Dressing one or multiple wounds 0 []  - Application of Medications - injection 0 INTERVENTIONS - Miscellaneous []  - External ear exam 0 []  - Specimen Collection (cultures, biopsies, blood, body fluids, etc.) 0 []  - Specimen(s) / Culture(s) sent or taken to Lab for analysis 0 []  - Patient Transfer (multiple staff / [employer] fast streamer / Similar devices) 0 []  - Simple Staple / Suture removal (25 or less) 0 []  - Complex Staple / Suture removal (26 or more) 0 Corso, Kylena R. ([id number]) []  - Hypo / Hyperglycemic Management (close monitor of Blood Glucose) 0 []  - Ankle / Brachial Index (ABI) - do not check if billed separately 0 Has the patient been seen at the hospital within the last three years: Yes Total Score: 265 Level Of Care: New/Established - Level 5 Electronic Signature(s) Signed: 04/23/2015 5:20:03 PM By: Montey Hora Entered By: Montey Hora on 04/23/2015 12:11:23 [name] ([id number]) -------------------------------------------------------------------------------- Encounter  Discharge Information Details Patient Name: [name] Date of Service: 04/23/2015 10:15 AM Medical Record Number: [id number] Patient Account Number: [id number] Date of Birth/Sex: [birth year] (49 y.o. Female) Treating RN: Montey Hora Primary Care Physician: Arnette Norris Other Clinician: Referring Physician: Arnette Norris Treating Physician/Extender: Frann Rider in Treatment: 0 Encounter Discharge Information Items Discharge Pain Level: 0 Discharge Condition: Stable Ambulatory Status: Wheelchair Discharge Destination: Home Transportation: Private Auto Accompanied By: spouse Schedule Follow-up Appointment: Yes Medication Reconciliation completed and provided to Patient/Care No Allora Bains: Provided on Clinical Summary of Care: 04/23/2015 Form Type Recipient Paper Patient [place] Signature(s) Signed: 04/23/2015 12:30:57 PM By: Ruthine Dose Entered By: Ruthine Dose on 04/23/2015 12:30:57 [name] ([id number]) -------------------------------------------------------------------------------- Multi Wound Chart Details Patient Name: [name] Date of Service: 04/23/2015 10:15 AM Medical Record Number: [id number] Patient Account Number: [id number] Date of Birth/Sex: November 25, 1965 (49 y.o. Female) Treating RN: Montey Hora Primary Care Physician: Arnette Norris Other Clinician: Referring Physician: Arnette Norris Treating Physician/Extender: Frann Rider in Treatment: 0 Vital Signs Height(in): 68 Pulse(bpm): 76 Weight(lbs): 220 Blood Pressure 109/43 (mmHg): Body Mass Index(BMI): 33 Temperature(F): 98.1 Respiratory Rate 18 (breaths/min): Photos: [10:No Photos] [11:No Photos] [12:No Photos] Wound Location: [10:Right Sacrum] [11:Right Elbow] [12:Right Hand - 1st Digit] Wounding Event: [10:Pressure Injury] [11:Pressure Injury] [12:Trauma] Primary Etiology: [10:Pressure Ulcer] [11:Pressure Ulcer] [12:Trauma, Other] Comorbid History:  [10:Cataracts, Anemia, Type II Diabetes, End Stage Renal Disease, History of Burn, Neuropathy] [11:Cataracts, Anemia, Type II Diabetes, End Stage Renal Disease, History of Burn, Neuropathy] [12:Cataracts, Anemia, Type II Diabetes, End  Stage Renal Disease, History of Burn, Neuropathy] Date Acquired: [10:04/01/2015] [11:12/04/2014] [12:12/04/2014] Weeks of Treatment: [10:0] [11:0] [12:0] Wound Status: [10:Open] [11:Open] [12:Open] Measurements L x W x D 1.8x3.5x0.1 [11:1.1x1x0.1] [12:1.2x1.3x0.1] (cm) Area (cm) : [10:4.948] [11:0.864] [12:1.225] Volume (cm) : [10:0.495] [11:0.086] [12:0.123] % Reduction in Area: [10:0.00%] [11:0.00%] [12:0.00%] % Reduction in Volume: 0.00% [11:0.00%] [12:0.00%] Classification: [10:Unstageable/Unclassified] [11:Unstageable/Unclassified] [12:Partial Thickness] Exudate Amount: [10:None Present] [11:None Present] [12:Medium] Exudate Type: [10:N/A] [11:N/A] [12:Serous] Exudate  Hand - 1st Digit Wound Open Wounding Event: Trauma Status: Date Acquired: 12/04/2014 Comorbid Cataracts, Anemia, Type II Diabetes, Weeks Of Treatment: 0 History: End Stage Renal Disease, History  of Clustered Wound: No Burn, Neuropathy Photos Photo Uploaded By: Gretta Cool, RN, BSN, Kim on 04/23/2015 17:07:53 Wound Measurements Length: (cm) 0.6 Width: (cm) 0.9 Depth: (cm) 0.1 Area: (cm) 0.424 Volume: (cm) 0.042 % Reduction in Area: 0% % Reduction in Volume: 0% Epithelialization: None Tunneling: No Undermining: No Wound Description Classification: Partial Thickness Wound Margin: Flat and Intact Exudate Amount: Small Exudate Type: Serous Exudate Color: amber Foul Odor After Cleansing: No Wound Bed Granulation Amount: Medium (34-66%) Exposed Structure Granulation Quality: Red Fascia Exposed: No Necrotic Amount: Small (1-33%) Fat Layer Exposed: No Necrotic Quality: Eschar Tendon Exposed: No Claros, Satoria R. ([id number]) Muscle Exposed: No Joint Exposed: No Bone Exposed: No Limited to Skin Breakdown Periwound Skin Texture Texture Color No Abnormalities Noted: No No Abnormalities Noted: No Callus: No Atrophie Blanche: No Crepitus: No Cyanosis: No Excoriation: No Ecchymosis: No Fluctuance: No Erythema: No Friable: No Hemosiderin Staining: No Induration: No Mottled: No Localized Edema: No Pallor: No Rash: No Rubor: No Scarring: No Temperature / Pain Moisture Temperature: No Abnormality No Abnormalities Noted: No Dry / Scaly: No Maceration: No Moist: No Wound Preparation Ulcer Cleansing: Rinsed/Irrigated with Saline Topical Anesthetic Applied: None Treatment Notes Wound #14 (Left Hand - 1st Digit) 1. Cleansed with: Clean wound with Normal Saline 3. Peri-wound Care: Skin Prep 4. Dressing Applied: Other dressing (specify in notes) 5. Secondary Dressing Applied Bordered Foam Dressing Notes betadine paint, BFD to sacrum only Electronic Signature(s) Signed: 04/23/2015 11:43:00 AM By: Montey Hora Entered By: Montey Hora on 04/23/2015 11:43:00 [name]  ([id number]) -------------------------------------------------------------------------------- Wound Assessment Details Patient Name: [name] Date of Service: 04/23/2015 10:15 AM Medical Record Number: [id number] Patient Account Number: [id number] Date of Birth/Sex: [birth year] (49 y.o. Female) Treating RN: Montey Hora Primary Care Physician: Arnette Norris Other Clinician: Referring Physician: Arnette Norris Treating Physician/Extender: Frann Rider in Treatment: 0 Wound Status Wound Number: 15 Primary Trauma, Other Etiology: Wound Location: Left Hand - 2nd Digit Wound Open Wounding Event: Trauma Status: Date Acquired: 12/04/2014 Comorbid Cataracts, Anemia, Type II Diabetes, Weeks Of Treatment: 0 History: End Stage Renal Disease, History of Clustered Wound: No Burn, Neuropathy Photos Photo Uploaded By: Gretta Cool, RN, BSN, Kim on 04/23/2015 17:08:18 Wound Measurements Length: (cm) 1.3 Width: (cm) 2.4 Depth: (cm) 0.1 Area: (cm) 2.45 Volume: (cm) 0.245 % Reduction in Area: 0% % Reduction in Volume: 0% Epithelialization: None Tunneling: No Undermining: No Wound Description Classification: Partial Thickness Wound Margin: Flat and Intact Exudate Amount: Small Exudate Type: Serous Exudate Color: amber Foul Odor After Cleansing: No Wound Bed Granulation Amount: Large (67-100%) Exposed Structure Granulation Quality: Red Fascia Exposed: No Necrotic Amount: None Present (0%) Fat Layer Exposed: No Tendon Exposed: No Kincer, Kilani R. ([id number]) Muscle Exposed: No Joint Exposed: No Bone Exposed: No Limited to Skin Breakdown Periwound Skin Texture Texture Color No Abnormalities Noted: No No Abnormalities Noted: No Callus: No Atrophie Blanche: No Crepitus: No Cyanosis: No Excoriation: No Ecchymosis: No Fluctuance: No Erythema: No Friable: No Hemosiderin Staining: No Induration: No Mottled: No Localized Edema: No Pallor: No Rash: No Rubor:  No Scarring: No Temperature / Pain Moisture Temperature: No Abnormality No Abnormalities Noted: No Dry / Scaly: No Maceration: No Moist: No Wound Preparation Ulcer Cleansing: Rinsed/Irrigated with Saline Topical Anesthetic Applied: None Treatment Notes Wound #15 (Left Hand - 2nd Digit) 1. Cleansed with: Clean wound  Wound Healing Center Program Date Initiated: 04/23/2015 Goal Status: Active Interventions: Provide education on orientation to the wound center Notes: Peripheral Neuropathy Nursing Diagnoses: Potential alteration in peripheral tissue perfusion (select prior to confirmation of  diagnosis) Goals: Patient/caregiver will verbalize understanding of disease process and disease management LAURAINE, CRESPO ([id number]) Date Initiated: 04/23/2015 Goal Status: Active Interventions: Assess signs and symptoms of neuropathy upon admission and as needed Notes: Pressure Nursing Diagnoses: Potential for impaired tissue integrity related to pressure, friction, moisture, and shear Goals: Patient will remain free from development of additional pressure ulcers Date Initiated: 04/23/2015 Goal Status: Active Interventions: Assess offloading mechanisms upon admission and as needed Notes: Wound/Skin Impairment Nursing Diagnoses: Impaired tissue integrity Goals: Ulcer/skin breakdown will have a volume reduction of 30% by week 4 Date Initiated: 04/23/2015 Goal Status: Active Ulcer/skin breakdown will have a volume reduction of 50% by week 8 Date Initiated: 04/23/2015 Goal Status: Active Ulcer/skin breakdown will have a volume reduction of 80% by week 12 Date Initiated: 04/23/2015 Goal Status: Active Ulcer/skin breakdown will heal within 14 weeks Date Initiated: 04/23/2015 Goal Status: Active Interventions: Assess patient/caregiver ability to obtain necessary supplies Assess ulceration(s) every visit Notes: WRIGLEY, WINBORNE ([id number]) Electronic Signature(s) Signed: 04/23/2015 11:50:09 AM By: Montey Hora Entered By: Montey Hora on 04/23/2015 11:50:08 [name] ([id number]) -------------------------------------------------------------------------------- Patient/Caregiver Education Details Patient Name: [name] Date of Service: 04/23/2015 10:15 AM Medical Record Number: [id number] Patient Account Number: [id number] Date of Birth/Gender: 1966/03/19 (49 y.o. Female) Treating RN: Montey Hora Primary Care Physician: Arnette Norris Other Clinician: Referring Physician: Arnette Norris Treating Physician/Extender: Frann Rider in Treatment:  0 Education Assessment Education Provided To: Patient and Caregiver Education Topics Provided Wound/Skin Impairment: Handouts: Other: wound care as ordered Methods: Demonstration, Explain/Verbal Responses: State content correctly Electronic Signature(s) Signed: 04/23/2015 11:51:08 AM By: Montey Hora Entered By: Montey Hora on 04/23/2015 11:51:08 [name] ([id number]) -------------------------------------------------------------------------------- Wound Assessment Details Patient Name: [name] Date of Service: 04/23/2015 10:15 AM Medical Record Number: [id number] Patient Account Number: [id number] Date of Birth/Sex: 06-May-1966 (49 y.o. Female) Treating RN: Montey Hora Primary Care Physician: Arnette Norris Other Clinician: Referring Physician: Arnette Norris Treating Physician/Extender: Frann Rider in Treatment: 0 Wound Status Wound Number: 10 Primary Pressure Ulcer Etiology: Wound Location: Right Sacrum Wound Open Wounding Event: Pressure Injury Status: Date Acquired: 04/01/2015 Comorbid Cataracts, Anemia, Type II Diabetes, Weeks Of Treatment: 0 History: End Stage Renal Disease, History of Clustered Wound: No Burn, Neuropathy Photos Photo Uploaded By: Gretta Cool, RN, BSN, Kim on 04/23/2015 17:06:43 Wound Measurements Length: (cm) 1.8 Width: (cm) 3.5 Depth: (cm) 0.1 Area: (cm) 4.948 Volume: (cm) 0.495 % Reduction in Area: 0% % Reduction in Volume: 0% Epithelialization: None Tunneling: No Undermining: No Wound Description Classification: Unstageable/Unclassified Wound Margin: Flat and Intact Exudate Amount: None Present Foul Odor After Cleansing: No Wound Bed Granulation Amount: None Present (0%) Exposed Structure Necrotic Amount: Large (67-100%) Fascia Exposed: No Necrotic Quality: Eschar Fat Layer Exposed: No Tendon Exposed: No Muscle Exposed: No Joint Exposed: No Deschepper, Joby R. ([id number]) Bone Exposed: No Limited  to Skin Breakdown Periwound Skin Texture Texture Color No Abnormalities Noted: No No Abnormalities Noted: No Callus: No Atrophie Blanche: No Crepitus: No Cyanosis: No Excoriation: No Ecchymosis: No Fluctuance: No Erythema: No Friable: No Hemosiderin Staining: No Induration: No Mottled: No Localized Edema: No Pallor: No Rash: No Rubor: No Scarring: No Temperature / Pain Moisture Temperature: No Abnormality No Abnormalities Noted: No Tenderness on Palpation: Yes Dry / Scaly: No Maceration: No  Wound Healing Center Program Date Initiated: 04/23/2015 Goal Status: Active Interventions: Provide education on orientation to the wound center Notes: Peripheral Neuropathy Nursing Diagnoses: Potential alteration in peripheral tissue perfusion (select prior to confirmation of  diagnosis) Goals: Patient/caregiver will verbalize understanding of disease process and disease management LAURAINE, CRESPO ([id number]) Date Initiated: 04/23/2015 Goal Status: Active Interventions: Assess signs and symptoms of neuropathy upon admission and as needed Notes: Pressure Nursing Diagnoses: Potential for impaired tissue integrity related to pressure, friction, moisture, and shear Goals: Patient will remain free from development of additional pressure ulcers Date Initiated: 04/23/2015 Goal Status: Active Interventions: Assess offloading mechanisms upon admission and as needed Notes: Wound/Skin Impairment Nursing Diagnoses: Impaired tissue integrity Goals: Ulcer/skin breakdown will have a volume reduction of 30% by week 4 Date Initiated: 04/23/2015 Goal Status: Active Ulcer/skin breakdown will have a volume reduction of 50% by week 8 Date Initiated: 04/23/2015 Goal Status: Active Ulcer/skin breakdown will have a volume reduction of 80% by week 12 Date Initiated: 04/23/2015 Goal Status: Active Ulcer/skin breakdown will heal within 14 weeks Date Initiated: 04/23/2015 Goal Status: Active Interventions: Assess patient/caregiver ability to obtain necessary supplies Assess ulceration(s) every visit Notes: WRIGLEY, WINBORNE ([id number]) Electronic Signature(s) Signed: 04/23/2015 11:50:09 AM By: Montey Hora Entered By: Montey Hora on 04/23/2015 11:50:08 [name] ([id number]) -------------------------------------------------------------------------------- Patient/Caregiver Education Details Patient Name: [name] Date of Service: 04/23/2015 10:15 AM Medical Record Number: [id number] Patient Account Number: [id number] Date of Birth/Gender: 1966/03/19 (49 y.o. Female) Treating RN: Montey Hora Primary Care Physician: Arnette Norris Other Clinician: Referring Physician: Arnette Norris Treating Physician/Extender: Frann Rider in Treatment:  0 Education Assessment Education Provided To: Patient and Caregiver Education Topics Provided Wound/Skin Impairment: Handouts: Other: wound care as ordered Methods: Demonstration, Explain/Verbal Responses: State content correctly Electronic Signature(s) Signed: 04/23/2015 11:51:08 AM By: Montey Hora Entered By: Montey Hora on 04/23/2015 11:51:08 [name] ([id number]) -------------------------------------------------------------------------------- Wound Assessment Details Patient Name: [name] Date of Service: 04/23/2015 10:15 AM Medical Record Number: [id number] Patient Account Number: [id number] Date of Birth/Sex: 06-May-1966 (49 y.o. Female) Treating RN: Montey Hora Primary Care Physician: Arnette Norris Other Clinician: Referring Physician: Arnette Norris Treating Physician/Extender: Frann Rider in Treatment: 0 Wound Status Wound Number: 10 Primary Pressure Ulcer Etiology: Wound Location: Right Sacrum Wound Open Wounding Event: Pressure Injury Status: Date Acquired: 04/01/2015 Comorbid Cataracts, Anemia, Type II Diabetes, Weeks Of Treatment: 0 History: End Stage Renal Disease, History of Clustered Wound: No Burn, Neuropathy Photos Photo Uploaded By: Gretta Cool, RN, BSN, Kim on 04/23/2015 17:06:43 Wound Measurements Length: (cm) 1.8 Width: (cm) 3.5 Depth: (cm) 0.1 Area: (cm) 4.948 Volume: (cm) 0.495 % Reduction in Area: 0% % Reduction in Volume: 0% Epithelialization: None Tunneling: No Undermining: No Wound Description Classification: Unstageable/Unclassified Wound Margin: Flat and Intact Exudate Amount: None Present Foul Odor After Cleansing: No Wound Bed Granulation Amount: None Present (0%) Exposed Structure Necrotic Amount: Large (67-100%) Fascia Exposed: No Necrotic Quality: Eschar Fat Layer Exposed: No Tendon Exposed: No Muscle Exposed: No Joint Exposed: No Deschepper, Joby R. ([id number]) Bone Exposed: No Limited  to Skin Breakdown Periwound Skin Texture Texture Color No Abnormalities Noted: No No Abnormalities Noted: No Callus: No Atrophie Blanche: No Crepitus: No Cyanosis: No Excoriation: No Ecchymosis: No Fluctuance: No Erythema: No Friable: No Hemosiderin Staining: No Induration: No Mottled: No Localized Edema: No Pallor: No Rash: No Rubor: No Scarring: No Temperature / Pain Moisture Temperature: No Abnormality No Abnormalities Noted: No Tenderness on Palpation: Yes Dry / Scaly: No Maceration: No  Hand - 1st Digit Wound Open Wounding Event: Trauma Status: Date Acquired: 12/04/2014 Comorbid Cataracts, Anemia, Type II Diabetes, Weeks Of Treatment: 0 History: End Stage Renal Disease, History  of Clustered Wound: No Burn, Neuropathy Photos Photo Uploaded By: Gretta Cool, RN, BSN, Kim on 04/23/2015 17:07:53 Wound Measurements Length: (cm) 0.6 Width: (cm) 0.9 Depth: (cm) 0.1 Area: (cm) 0.424 Volume: (cm) 0.042 % Reduction in Area: 0% % Reduction in Volume: 0% Epithelialization: None Tunneling: No Undermining: No Wound Description Classification: Partial Thickness Wound Margin: Flat and Intact Exudate Amount: Small Exudate Type: Serous Exudate Color: amber Foul Odor After Cleansing: No Wound Bed Granulation Amount: Medium (34-66%) Exposed Structure Granulation Quality: Red Fascia Exposed: No Necrotic Amount: Small (1-33%) Fat Layer Exposed: No Necrotic Quality: Eschar Tendon Exposed: No Claros, Satoria R. ([id number]) Muscle Exposed: No Joint Exposed: No Bone Exposed: No Limited to Skin Breakdown Periwound Skin Texture Texture Color No Abnormalities Noted: No No Abnormalities Noted: No Callus: No Atrophie Blanche: No Crepitus: No Cyanosis: No Excoriation: No Ecchymosis: No Fluctuance: No Erythema: No Friable: No Hemosiderin Staining: No Induration: No Mottled: No Localized Edema: No Pallor: No Rash: No Rubor: No Scarring: No Temperature / Pain Moisture Temperature: No Abnormality No Abnormalities Noted: No Dry / Scaly: No Maceration: No Moist: No Wound Preparation Ulcer Cleansing: Rinsed/Irrigated with Saline Topical Anesthetic Applied: None Treatment Notes Wound #14 (Left Hand - 1st Digit) 1. Cleansed with: Clean wound with Normal Saline 3. Peri-wound Care: Skin Prep 4. Dressing Applied: Other dressing (specify in notes) 5. Secondary Dressing Applied Bordered Foam Dressing Notes betadine paint, BFD to sacrum only Electronic Signature(s) Signed: 04/23/2015 11:43:00 AM By: Montey Hora Entered By: Montey Hora on 04/23/2015 11:43:00 [name]  ([id number]) -------------------------------------------------------------------------------- Wound Assessment Details Patient Name: [name] Date of Service: 04/23/2015 10:15 AM Medical Record Number: [id number] Patient Account Number: [id number] Date of Birth/Sex: [birth year] (49 y.o. Female) Treating RN: Montey Hora Primary Care Physician: Arnette Norris Other Clinician: Referring Physician: Arnette Norris Treating Physician/Extender: Frann Rider in Treatment: 0 Wound Status Wound Number: 15 Primary Trauma, Other Etiology: Wound Location: Left Hand - 2nd Digit Wound Open Wounding Event: Trauma Status: Date Acquired: 12/04/2014 Comorbid Cataracts, Anemia, Type II Diabetes, Weeks Of Treatment: 0 History: End Stage Renal Disease, History of Clustered Wound: No Burn, Neuropathy Photos Photo Uploaded By: Gretta Cool, RN, BSN, Kim on 04/23/2015 17:08:18 Wound Measurements Length: (cm) 1.3 Width: (cm) 2.4 Depth: (cm) 0.1 Area: (cm) 2.45 Volume: (cm) 0.245 % Reduction in Area: 0% % Reduction in Volume: 0% Epithelialization: None Tunneling: No Undermining: No Wound Description Classification: Partial Thickness Wound Margin: Flat and Intact Exudate Amount: Small Exudate Type: Serous Exudate Color: amber Foul Odor After Cleansing: No Wound Bed Granulation Amount: Large (67-100%) Exposed Structure Granulation Quality: Red Fascia Exposed: No Necrotic Amount: None Present (0%) Fat Layer Exposed: No Tendon Exposed: No Kincer, Kilani R. ([id number]) Muscle Exposed: No Joint Exposed: No Bone Exposed: No Limited to Skin Breakdown Periwound Skin Texture Texture Color No Abnormalities Noted: No No Abnormalities Noted: No Callus: No Atrophie Blanche: No Crepitus: No Cyanosis: No Excoriation: No Ecchymosis: No Fluctuance: No Erythema: No Friable: No Hemosiderin Staining: No Induration: No Mottled: No Localized Edema: No Pallor: No Rash: No Rubor:  No Scarring: No Temperature / Pain Moisture Temperature: No Abnormality No Abnormalities Noted: No Dry / Scaly: No Maceration: No Moist: No Wound Preparation Ulcer Cleansing: Rinsed/Irrigated with Saline Topical Anesthetic Applied: None Treatment Notes Wound #15 (Left Hand - 2nd Digit) 1. Cleansed with: Clean wound  Exposed: No Necrotic Amount: Medium (34-66%) Fat Layer Exposed: No Necrotic Quality: Eschar Tendon Exposed: No Malcolm, Karah R. ([id number]) Muscle Exposed: No Joint Exposed: No Bone Exposed: No Limited to Skin Breakdown Periwound Skin Texture Texture Color No Abnormalities Noted: No No Abnormalities Noted: No Callus: No Atrophie Blanche: No Crepitus: No Cyanosis: No Excoriation: No Ecchymosis: No Fluctuance: No Erythema: No Friable: No Hemosiderin Staining: No Induration: No Mottled: No Localized Edema: No Pallor: No Rash: No Rubor: No Scarring: No Temperature / Pain Moisture Temperature: No Abnormality No Abnormalities Noted: No Dry / Scaly: No Maceration: No Moist: No Wound Preparation Ulcer Cleansing: Rinsed/Irrigated with Saline Topical  Anesthetic Applied: None Treatment Notes Wound #12 (Right Hand - 1st Digit) 1. Cleansed with: Clean wound with Normal Saline 3. Peri-wound Care: Skin Prep 4. Dressing Applied: Other dressing (specify in notes) 5. Secondary Dressing Applied Bordered Foam Dressing Notes betadine paint, BFD to sacrum only Electronic Signature(s) Signed: 04/23/2015 11:43:56 AM By: Montey Hora Entered By: Montey Hora on 04/23/2015 11:43:56 Delawder, Danton Sewer ([id number]) -------------------------------------------------------------------------------- Wound Assessment Details Patient Name: [name] Date of Service: 04/23/2015 10:15 AM Medical Record Number: [id number] Patient Account Number: [id number] Date of Birth/Sex: 06/04/1966 (49 y.o. Female) Treating RN: Montey Hora Primary Care Physician: Arnette Norris Other Clinician: Referring Physician: Arnette Norris Treating Physician/Extender: Frann Rider in Treatment: 0 Wound Status Wound Number: 13 Primary Trauma, Other Etiology: Wound Location: Right Hand - 2nd Digit Wound Open Wounding Event: Trauma Status: Date Acquired: 12/04/2014 Comorbid Cataracts, Anemia, Type II Diabetes, Weeks Of Treatment: 0 History: End Stage Renal Disease, History of Clustered Wound: No Burn, Neuropathy Photos Photo Uploaded By: Gretta Cool, RN, BSN, Kim on 04/23/2015 17:07:53 Wound Measurements Length: (cm) 0.3 Width: (cm) 0.5 Depth: (cm) 0.1 Area: (cm) 0.118 Volume: (cm) 0.012 % Reduction in Area: 0% % Reduction in Volume: 0% Epithelialization: Small (1-33%) Tunneling: No Undermining: No Wound Description Classification: Partial Thickness Wound Margin: Flat and Intact Exudate Amount: Small Exudate Type: Serous Exudate Color: amber Foul Odor After Cleansing: No Wound Bed Granulation Amount: Large (67-100%) Exposed Structure Granulation Quality: Red Fascia Exposed: No Necrotic Amount: None Present (0%) Fat Layer Exposed:  No Tendon Exposed: No Lovingood, Maryella R. ([id number]) Muscle Exposed: No Joint Exposed: No Bone Exposed: No Limited to Skin Breakdown Periwound Skin Texture Texture Color No Abnormalities Noted: No No Abnormalities Noted: No Callus: No Atrophie Blanche: No Crepitus: No Cyanosis: No Excoriation: No Ecchymosis: No Fluctuance: No Erythema: No Friable: No Hemosiderin Staining: No Induration: No Mottled: No Localized Edema: No Pallor: No Rash: No Rubor: No Scarring: No Temperature / Pain Moisture Temperature: No Abnormality No Abnormalities Noted: No Dry / Scaly: No Maceration: No Moist: No Wound Preparation Ulcer Cleansing: Rinsed/Irrigated with Saline Topical Anesthetic Applied: None Treatment Notes Wound #13 (Right Hand - 2nd Digit) 1. Cleansed with: Clean wound with Normal Saline 3. Peri-wound Care: Skin Prep 4. Dressing Applied: Other dressing (specify in notes) 5. Secondary Dressing Applied Bordered Foam Dressing Notes betadine paint, BFD to sacrum only Electronic Signature(s) Signed: 04/23/2015 11:44:08 AM By: Montey Hora Entered By: Montey Hora on 04/23/2015 11:44:08 [name] ([id number]) -------------------------------------------------------------------------------- Wound Assessment Details Patient Name: [name] Date of Service: 04/23/2015 10:15 AM Medical Record Number: [id number] Patient Account Number: [id number] Date of Birth/Sex: 1965/12/06 (49 y.o. Female) Treating RN: Montey Hora Primary Care Physician: Arnette Norris Other Clinician: Referring Physician: Arnette Norris Treating Physician/Extender: Frann Rider in Treatment: 0 Wound Status Wound Number: 14 Primary Trauma, Other Etiology: Wound Location: Left  No JAILINE, LIEDER R. ([id number]) Muscle Exposed: No Joint Exposed: No Bone Exposed: No Limited to Skin Breakdown Periwound Skin Texture Texture Color No Abnormalities Noted: No No Abnormalities Noted: No Callus: No Atrophie Blanche: No Crepitus: No Cyanosis: No Excoriation: No Ecchymosis: No Fluctuance: No Erythema: No Friable: No Hemosiderin Staining: No Induration: No Mottled: No Localized Edema: No Pallor: No Rash: No Rubor: No Scarring: No Temperature / Pain Moisture Temperature: No Abnormality No Abnormalities Noted: No Dry / Scaly: No Maceration: No Moist: No Wound Preparation Ulcer Cleansing: Rinsed/Irrigated with Saline Topical Anesthetic Applied: None Treatment Notes Wound #17 (Left Hand - 4th Digit) 1. Cleansed with: Clean wound with Normal Saline 3. Peri-wound Care: Skin Prep 4. Dressing Applied: Other dressing (specify in notes) 5. Secondary Dressing Applied Bordered Foam Dressing Notes betadine paint, BFD to sacrum only Electronic Signature(s) Signed: 04/23/2015  11:47:53 AM By: Montey Hora Entered By: Montey Hora on 04/23/2015 11:47:53 [name] ([id number]) -------------------------------------------------------------------------------- Vitals Details Patient Name: [name] Date of Service: 04/23/2015 10:15 AM Medical Record Number: [id number] Patient Account Number: [id number] Date of Birth/Sex: Jul 27, 1966 (49 y.o. Female) Treating RN: Montey Hora Primary Care Physician: Arnette Norris Other Clinician: Referring Physician: Arnette Norris Treating Physician/Extender: Frann Rider in Treatment: 0 Vital Signs Time Taken: 10:54 Temperature (F): 98.1 Height (in): 68 Pulse (bpm): 76 Source: Stated Respiratory Rate (breaths/min): 18 Weight (lbs): 220 Blood Pressure (mmHg): 109/43 Source: Stated Reference Range: 80 - 120 mg / dl Body Mass Index (BMI): 33.4 Electronic Signature(s) Signed: 04/23/2015 5:20:03 PM By: Montey Hora Entered By: Montey Hora on 04/23/2015 10:59:44  Exposed: No Necrotic Amount: Medium (34-66%) Fat Layer Exposed: No Necrotic Quality: Eschar Tendon Exposed: No Malcolm, Karah R. ([id number]) Muscle Exposed: No Joint Exposed: No Bone Exposed: No Limited to Skin Breakdown Periwound Skin Texture Texture Color No Abnormalities Noted: No No Abnormalities Noted: No Callus: No Atrophie Blanche: No Crepitus: No Cyanosis: No Excoriation: No Ecchymosis: No Fluctuance: No Erythema: No Friable: No Hemosiderin Staining: No Induration: No Mottled: No Localized Edema: No Pallor: No Rash: No Rubor: No Scarring: No Temperature / Pain Moisture Temperature: No Abnormality No Abnormalities Noted: No Dry / Scaly: No Maceration: No Moist: No Wound Preparation Ulcer Cleansing: Rinsed/Irrigated with Saline Topical  Anesthetic Applied: None Treatment Notes Wound #12 (Right Hand - 1st Digit) 1. Cleansed with: Clean wound with Normal Saline 3. Peri-wound Care: Skin Prep 4. Dressing Applied: Other dressing (specify in notes) 5. Secondary Dressing Applied Bordered Foam Dressing Notes betadine paint, BFD to sacrum only Electronic Signature(s) Signed: 04/23/2015 11:43:56 AM By: Montey Hora Entered By: Montey Hora on 04/23/2015 11:43:56 Delawder, Danton Sewer ([id number]) -------------------------------------------------------------------------------- Wound Assessment Details Patient Name: [name] Date of Service: 04/23/2015 10:15 AM Medical Record Number: [id number] Patient Account Number: [id number] Date of Birth/Sex: 06/04/1966 (49 y.o. Female) Treating RN: Montey Hora Primary Care Physician: Arnette Norris Other Clinician: Referring Physician: Arnette Norris Treating Physician/Extender: Frann Rider in Treatment: 0 Wound Status Wound Number: 13 Primary Trauma, Other Etiology: Wound Location: Right Hand - 2nd Digit Wound Open Wounding Event: Trauma Status: Date Acquired: 12/04/2014 Comorbid Cataracts, Anemia, Type II Diabetes, Weeks Of Treatment: 0 History: End Stage Renal Disease, History of Clustered Wound: No Burn, Neuropathy Photos Photo Uploaded By: Gretta Cool, RN, BSN, Kim on 04/23/2015 17:07:53 Wound Measurements Length: (cm) 0.3 Width: (cm) 0.5 Depth: (cm) 0.1 Area: (cm) 0.118 Volume: (cm) 0.012 % Reduction in Area: 0% % Reduction in Volume: 0% Epithelialization: Small (1-33%) Tunneling: No Undermining: No Wound Description Classification: Partial Thickness Wound Margin: Flat and Intact Exudate Amount: Small Exudate Type: Serous Exudate Color: amber Foul Odor After Cleansing: No Wound Bed Granulation Amount: Large (67-100%) Exposed Structure Granulation Quality: Red Fascia Exposed: No Necrotic Amount: None Present (0%) Fat Layer Exposed:  No Tendon Exposed: No Lovingood, Maryella R. ([id number]) Muscle Exposed: No Joint Exposed: No Bone Exposed: No Limited to Skin Breakdown Periwound Skin Texture Texture Color No Abnormalities Noted: No No Abnormalities Noted: No Callus: No Atrophie Blanche: No Crepitus: No Cyanosis: No Excoriation: No Ecchymosis: No Fluctuance: No Erythema: No Friable: No Hemosiderin Staining: No Induration: No Mottled: No Localized Edema: No Pallor: No Rash: No Rubor: No Scarring: No Temperature / Pain Moisture Temperature: No Abnormality No Abnormalities Noted: No Dry / Scaly: No Maceration: No Moist: No Wound Preparation Ulcer Cleansing: Rinsed/Irrigated with Saline Topical Anesthetic Applied: None Treatment Notes Wound #13 (Right Hand - 2nd Digit) 1. Cleansed with: Clean wound with Normal Saline 3. Peri-wound Care: Skin Prep 4. Dressing Applied: Other dressing (specify in notes) 5. Secondary Dressing Applied Bordered Foam Dressing Notes betadine paint, BFD to sacrum only Electronic Signature(s) Signed: 04/23/2015 11:44:08 AM By: Montey Hora Entered By: Montey Hora on 04/23/2015 11:44:08 [name] ([id number]) -------------------------------------------------------------------------------- Wound Assessment Details Patient Name: [name] Date of Service: 04/23/2015 10:15 AM Medical Record Number: [id number] Patient Account Number: [id number] Date of Birth/Sex: 1965/12/06 (49 y.o. Female) Treating RN: Montey Hora Primary Care Physician: Arnette Norris Other Clinician: Referring Physician: Arnette Norris Treating Physician/Extender: Frann Rider in Treatment: 0 Wound Status Wound Number: 14 Primary Trauma, Other Etiology: Wound Location: Left
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
## 2015-04-24 NOTE — Progress Notes (Signed)
AVEREE, HARB ([id number]) Visit Report for 04/23/2015 Abuse/Suicide Risk Screen Details Patient Name: [name], [name] Date of Service: 04/23/2015 10:15 AM Medical Record Number: [id number] Patient Account Number: [id number] Date of Birth/Sex: 1965-12-16 (49 y.o. Female) Treating RN: Montey Hora Primary Care Physician: Arnette Norris Other Clinician: Referring Physician: Arnette Norris Treating Physician/Extender: Frann Rider in Treatment: 0 Abuse/Suicide Risk Screen Items Answer ABUSE/SUICIDE RISK SCREEN: Has anyone close to you tried to hurt or harm you recentlyo No Do you feel uncomfortable with anyone in your familyo No Has anyone forced you do things that you didnot want to doo No Do you have any thoughts of harming yourselfo No Patient displays signs or symptoms of abuse and/or neglect. No Electronic Signature(s) Signed: 04/23/2015 5:20:03 PM By: Montey Hora Entered By: Montey Hora on 04/23/2015 11:06:06 [name] ([id number]) -------------------------------------------------------------------------------- Activities of Daily Living Details Patient Name: [name] Date of Service: 04/23/2015 10:15 AM Medical Record Number: [id number] Patient Account Number: [id number] Date of Birth/Sex: 10-12-1965 (48 y.o. Female) Treating RN: Montey Hora Primary Care Physician: Arnette Norris Other Clinician: Referring Physician: Arnette Norris Treating Physician/Extender: Frann Rider in Treatment: 0 Activities of Daily Living Items Answer Activities of Daily Living (Please select one for each item) Drive Automobile Not Able Take Medications Completely Able Use Telephone Completely Able Care for Appearance Completely Able Use Toilet Completely Able Bath / Shower Not Able Dress Self Need Assistance Feed Self Completely Able Walk Not Able Get In / Out Bed Need Assistance Housework Need Assistance Prepare Meals Completely [place] for Self Need Assistance Electronic Signature(s) Signed: 04/23/2015 5:20:03 PM By: Montey Hora Entered By: Montey Hora on 04/23/2015 11:06:40 [name] ([id number]) -------------------------------------------------------------------------------- Education Assessment Details Patient Name: [name] Date of Service: 04/23/2015 10:15 AM Medical Record Number: [id number] Patient Account Number: [id number] Date of Birth/Sex: 22-Apr-1966 (49 y.o. Female) Treating RN: Montey Hora Primary Care Physician: Arnette Norris Other Clinician: Referring Physician: Arnette Norris Treating Physician/Extender: Frann Rider in Treatment: 0 Primary Learner Assessed: Patient Learning Preferences/Education Level/Primary Language Learning Preference: Explanation, Demonstration Highest Education Level: College or Above Preferred Language: English Cognitive Barrier Assessment/Beliefs Language Barrier: No Translator Needed: No Memory Deficit: No Emotional Barrier: No Cultural/Religious Beliefs Affecting Medical No Care: Physical Barrier Assessment Impaired Vision: No Impaired Hearing: No Decreased Hand dexterity: No Knowledge/Comprehension Assessment Knowledge Level: Medium Comprehension Level: Medium Ability to understand written Medium instructions: Ability to understand verbal Medium instructions: Motivation Assessment Anxiety Level: Calm Cooperation: Cooperative Education Importance: Acknowledges Need Interest in Health Problems: Asks Questions Perception: Coherent Willingness to Engage in Self- Medium Management Activities: Readiness to Engage in Self- Medium Management Activities: Electronic Signature(s) [name], [name] ([id number]) Signed: 04/23/2015 5:20:03 PM By: Montey Hora Entered By: Montey Hora on 04/23/2015 11:07:14 [name], [name]  ([id number]) -------------------------------------------------------------------------------- Fall Risk Assessment Details Patient Name: [name] Date of Service: 04/23/2015 10:15 AM Medical Record Number: [id number] Patient Account Number: [id number] Date of Birth/Sex: 10/17/1965 (49 y.o. Female) Treating RN: Montey Hora Primary Care Physician: Arnette Norris Other Clinician: Referring Physician: Arnette Norris Treating Physician/Extender: Frann Rider in Treatment: 0 Fall Risk Assessment Items FALL RISK ASSESSMENT: History of falling - immediate or within 3 months 0 No Secondary diagnosis 0 No Ambulatory aid None/bed rest/wheelchair/nurse 0 Yes Crutches/cane/walker 0 No Furniture 0 No IV Access/Saline Lock 0 No Gait/Training Normal/bed rest/immobile 0 Yes Weak 0 No Impaired 0 No Mental Status Oriented to own ability 0 Yes Electronic Signature(s) Signed: 04/23/2015  5:20:03 PM By: Montey Hora Entered By: Montey Hora on 04/23/2015 11:07:22 [name] ([id number]) -------------------------------------------------------------------------------- Nutrition Risk Assessment Details Patient Name: [name] Date of Service: 04/23/2015 10:15 AM Medical Record Number: [id number] Patient Account Number: [id number] Date of Birth/Sex: 1966-02-27 (49 y.o. Female) Treating RN: Montey Hora Primary Care Physician: Arnette Norris Other Clinician: Referring Physician: Arnette Norris Treating Physician/Extender: Frann Rider in Treatment: 0 Height (in): 68 Weight (lbs): 220 Body Mass Index (BMI): 33.4 Nutrition Risk Assessment Items NUTRITION RISK SCREEN: I have an illness or condition that made me change the kind and/or 0 No amount of food I eat I eat fewer than two meals per day 0 No I eat few fruits and vegetables, or milk products 0 No I have three or more drinks of beer, liquor or wine almost every day 0 No I have tooth or mouth problems that  make it hard for me to eat 0 No I don't always have enough money to buy the food I need 0 No I eat alone most of the time 0 No I take three or more different prescribed or over-the-counter drugs a 1 Yes day Without wanting to, I have lost or gained 10 pounds in the last six 0 No months I am not always physically able to shop, cook and/or feed myself 0 No Nutrition Protocols Good Risk Protocol 0 No interventions needed Moderate Risk Protocol Electronic Signature(s) Signed: 04/23/2015 5:20:03 PM By: Montey Hora Entered By: Montey Hora on 04/23/2015 11:07:31

## 2015-04-25 ENCOUNTER — Ambulatory Visit
Admission: RE | Admit: 2015-04-25 | Discharge: 2015-04-25 | Disposition: A | Discharge status: Home / Self Care | Payer: Medicare Other | Source: Ambulatory Visit | Attending: Surgery | Admitting: Surgery

## 2015-04-25 ENCOUNTER — Other Ambulatory Visit: Payer: Self-pay | Admitting: Surgery

## 2015-04-25 DIAGNOSIS — S61208A Unspecified open wound of other finger without damage to nail, initial encounter: Secondary | ICD-10-CM | POA: Insufficient documentation

## 2015-04-25 DIAGNOSIS — I739 Peripheral vascular disease, unspecified: Secondary | ICD-10-CM | POA: Insufficient documentation

## 2015-04-25 DIAGNOSIS — S61209A Unspecified open wound of unspecified finger without damage to nail, initial encounter: Secondary | ICD-10-CM

## 2015-04-25 DIAGNOSIS — X58XXXA Exposure to other specified factors, initial encounter: Secondary | ICD-10-CM | POA: Diagnosis not present

## 2015-04-30 ENCOUNTER — Encounter (HOSPITAL_BASED_OUTPATIENT_CLINIC_OR_DEPARTMENT_OTHER): Payer: Medicare Other | Admitting: General Surgery

## 2015-04-30 ENCOUNTER — Encounter: Payer: Self-pay | Admitting: General Surgery

## 2015-04-30 DIAGNOSIS — L89312 Pressure ulcer of right buttock, stage 2: Secondary | ICD-10-CM | POA: Diagnosis not present

## 2015-04-30 DIAGNOSIS — L89310 Pressure ulcer of right buttock, unstageable: Secondary | ICD-10-CM | POA: Diagnosis not present

## 2015-04-30 NOTE — Progress Notes (Signed)
Limited to Skin Breakdown Periwound Skin Texture Texture Color No Abnormalities Noted: No No Abnormalities Noted: No Callus: No Atrophie Blanche: No Crepitus: No Cyanosis: No Excoriation: No Ecchymosis: No Fluctuance: No Erythema: No Friable: No Hemosiderin Staining: No Induration: No Mottled: No Localized Edema: No Pallor: No Rash: No Rubor: No Scarring: No Temperature / Pain Moisture Temperature: No Abnormality No Abnormalities Noted: No Tenderness on Palpation: Yes Dry / Scaly: No Maceration: No Moist: No Wound Preparation Ulcer Cleansing: Rinsed/Irrigated with Saline Topical Anesthetic Applied: None Treatment Notes Wound #10 (Right Sacrum) 1. Cleansed with: Clean wound with Normal Saline 4. Dressing Applied: Other dressing (specify in notes) 5. Secondary Dressing Applied Bordered Foam Dressing Notes betadine paint, BFD to sacrum only Electronic Signature(s) Signed: 04/30/2015 1:12:27 PM By: Montey Hora Entered By: Montey Hora on 04/30/2015 11:52:20 [name] ([id number]) -------------------------------------------------------------------------------- Wound Assessment Details Patient Name: [name] Date of Service: 04/30/2015 11:30 AM Medical Record Number: [id number] Patient Account Number: [id number] Date of Birth/Sex: September 06, 1965 (49 y.o. Female) Treating RN: Montey Hora Primary Care Physician: Arnette Norris Other Clinician: Referring Physician: Arnette Norris Treating Physician/Extender: Judene Companion Weeks in Treatment: 1 Wound  Status Wound Number: 11 Primary Pressure Ulcer Etiology: Wound Location: Right Elbow Wound Open Wounding Event: Pressure Injury Status: Date Acquired: 12/04/2014 Comorbid Cataracts, Anemia, Type II Diabetes, Weeks Of Treatment: 1 History: End Stage Renal Disease, History of Clustered Wound: No Burn, Neuropathy Photos Photo Uploaded By: Montey Hora on 04/30/2015 12:34:08 Wound Measurements Length: (cm) 0.6 Width: (cm) 0.9 Depth: (cm) 0.1 Area: (cm) 0.424 Volume: (cm) 0.042 % Reduction in Area: 50.9% % Reduction in Volume: 51.2% Epithelialization: None Tunneling: No Undermining: No Wound Description Classification: Unstageable/Unclassified Wound Margin: Flat and Intact Exudate Amount: None Present Foul Odor After Cleansing: No Wound Bed Granulation Amount: None Present (0%) Exposed Structure Necrotic Amount: Large (67-100%) Fascia Exposed: No Necrotic Quality: Eschar Fat Layer Exposed: No Tendon Exposed: No Muscle Exposed: No Joint Exposed: No Lemire, Akeiba R. ([id number]) Bone Exposed: No Limited to Skin Breakdown Periwound Skin Texture Texture Color No Abnormalities Noted: No No Abnormalities Noted: No Callus: No Atrophie Blanche: No Crepitus: No Cyanosis: No Excoriation: No Ecchymosis: No Fluctuance: No Erythema: No Friable: No Hemosiderin Staining: No Induration: No Mottled: No Localized Edema: No Pallor: No Rash: No Rubor: No Scarring: No Temperature / Pain Moisture Temperature: No Abnormality No Abnormalities Noted: No Dry / Scaly: No Maceration: No Moist: No Wound Preparation Ulcer Cleansing: Rinsed/Irrigated with Saline Topical Anesthetic Applied: None Treatment Notes Wound #11 (Right Elbow) 1. Cleansed with: Clean wound with Normal Saline 4. Dressing Applied: Other dressing (specify in notes) Notes betadine paint Electronic Signature(s) Signed: 04/30/2015 1:12:27 PM By: Montey Hora Entered By: Montey Hora on  04/30/2015 11:45:20 [name] ([id number]) -------------------------------------------------------------------------------- Wound Assessment Details Patient Name: [name] Date of Service: 04/30/2015 11:30 AM Medical Record Number: [id number] Patient Account Number: [id number] Date of Birth/Sex: November 03, 1965 (49 y.o. Female) Treating RN: Montey Hora Primary Care Physician: Arnette Norris Other Clinician: Referring Physician: Arnette Norris Treating Physician/Extender: Judene Companion Weeks in Treatment: 1 Wound Status Wound Number: 12 Primary Trauma, Other Etiology: Wound Location: Right Hand - 1st Digit Wound Open Wounding Event: Trauma Status: Date Acquired: 12/04/2014 Comorbid Cataracts, Anemia, Type II Diabetes, Weeks Of Treatment: 1 History: End Stage Renal Disease, History of Clustered Wound: No Burn, Neuropathy Photos Photo Uploaded By: Montey Hora on 04/30/2015 12:34:09 Wound Measurements Length: (cm) 1.2 Width: (cm) 1.6 Depth: (cm) 0.1 Area: (cm) 1.508 Volume: (cm) 0.151 % Reduction  Excoriation: No Ecchymosis: No Fluctuance: No Erythema: No Friable: No Hemosiderin Staining: No Induration: No Mottled: No Localized Edema: No Pallor: No Rash: No Rubor: No Scarring: No Temperature / Pain Moisture Temperature: No Abnormality No Abnormalities Noted: No Dry / Scaly: No Maceration: No Moist: No Wound Preparation Ulcer Cleansing: Rinsed/Irrigated with Saline Topical Anesthetic Applied: None Treatment Notes Wound #17 (Left Hand - 4th Digit) 1. Cleansed with: Clean wound with Normal Saline 4. Dressing Applied: Other dressing (specify in notes) Notes betadine paint Electronic Signature(s) Signed: 04/30/2015 1:12:27 PM By: Montey Hora Entered By: Montey Hora on 04/30/2015 11:47:11 [name] ([id number]) -------------------------------------------------------------------------------- Wound Assessment Details Patient Name: [name] Date of Service: 04/30/2015 11:30 AM Medical  Record Number: [id number] Patient Account Number: [id number] Date of Birth/Sex: [birth year] (49 y.o. Female) Treating RN: Montey Hora Primary Care Physician: Arnette Norris Other Clinician: Referring Physician: Arnette Norris Treating Physician/Extender: Judene Companion Weeks in Treatment: 1 Wound Status Wound Number: 18 Primary Trauma, Other Etiology: Wound Location: Right Hand - 3rd Digit Wound Open Wounding Event: Trauma Status: Date Acquired: 04/29/2015 Comorbid Cataracts, Anemia, Type II Diabetes, Weeks Of Treatment: 0 History: End Stage Renal Disease, History of Clustered Wound: No Burn, Neuropathy Photos Photo Uploaded By: Montey Hora on 04/30/2015 12:35:38 Wound Measurements Length: (cm) 0.7 Width: (cm) 1 Depth: (cm) 0.1 Area: (cm) 0.55 Volume: (cm) 0.055 % Reduction in Area: 0% % Reduction in Volume: 0% Epithelialization: None Tunneling: No Undermining: No Wound Description Classification: Partial Thickness Wound Margin: Flat and Intact Exudate Amount: None Present Foul Odor After Cleansing: No Wound Bed Granulation Amount: None Present (0%) Exposed Structure Necrotic Amount: Large (67-100%) Fascia Exposed: No Necrotic Quality: Eschar Fat Layer Exposed: No Tendon Exposed: No Muscle Exposed: No Joint Exposed: No Michaelson, Raegen R. ([id number]) Bone Exposed: No Limited to Skin Breakdown Periwound Skin Texture Texture Color No Abnormalities Noted: No No Abnormalities Noted: No Callus: No Atrophie Blanche: No Crepitus: No Cyanosis: No Excoriation: No Ecchymosis: No Fluctuance: No Erythema: No Friable: No Hemosiderin Staining: No Induration: No Mottled: No Localized Edema: No Pallor: No Rash: No Rubor: No Scarring: No Temperature / Pain Moisture Temperature: No Abnormality No Abnormalities Noted: No Dry / Scaly: No Maceration: No Moist: No Wound Preparation Ulcer Cleansing: Rinsed/Irrigated with Saline Topical Anesthetic Applied:  None Treatment Notes Wound #18 (Right Hand - 3rd Digit) 1. Cleansed with: Clean wound with Normal Saline 4. Dressing Applied: Other dressing (specify in notes) Notes betadine paint Electronic Signature(s) Signed: 04/30/2015 1:12:27 PM By: Montey Hora Entered By: Montey Hora on 04/30/2015 11:47:48 [name] ([id number]) -------------------------------------------------------------------------------- Vitals Details Patient Name: [name] Date of Service: 04/30/2015 11:30 AM Medical Record Number: [id number] Patient Account Number: [id number] Date of Birth/Sex: 18-Dec-1965 (49 y.o. Female) Treating RN: Montey Hora Primary Care Physician: Arnette Norris Other Clinician: Referring Physician: Arnette Norris Treating Physician/Extender: Benjaman Pott in Treatment: 1 Vital Signs Time Taken: 11:34 Temperature (F): 98.3 Height (in): 68 Pulse (bpm): 64 Weight (lbs): 220 Respiratory Rate (breaths/min): 18 Body Mass Index (BMI): 33.4 Blood Pressure (mmHg): 90/44 Reference Range: 80 - 120 mg / dl Electronic Signature(s) Signed: 04/30/2015 1:12:27 PM By: Montey Hora Entered By: Montey Hora on 04/30/2015 11:34:55  TONIANN, DICKERSON ([id number]) Visit Report for 04/30/2015 Arrival Information Details Patient Name: AMBERA, FEDELE Date of Service: 04/30/2015 11:30 AM Medical Record Number: [id number] Patient Account Number: [id number] Date of Birth/Sex: 02/28/1966 (49 y.o. Female) Treating RN: Montey Hora Primary Care Physician: Arnette Norris Other Clinician: Referring Physician: Arnette Norris Treating Physician/Extender: Benjaman Pott in Treatment: 1 Visit Information History Since Last Visit Added or deleted any medications: No Patient Arrived: Wheel Chair Any new allergies or adverse reactions: No Arrival Time: 11:34 Had a fall or experienced change in No activities of daily living that may affect Accompanied By: spouse risk of falls: Transfer Assistance: Manual Signs or symptoms of abuse/neglect since last No Patient Identification Verified: Yes visito Secondary Verification Process Yes Hospitalized since last visit: No Completed: Pain Present Now: No Patient Has Alerts: Yes Patient Alerts: DMII Electronic Signature(s) Signed: 04/30/2015 11:54:17 AM By: Judene Companion MD Entered By: Judene Companion on 04/30/2015 11:54:16 [name] ([id number]) -------------------------------------------------------------------------------- Clinic Level of Care Assessment Details Patient Name: [name] Date of Service: 04/30/2015 11:30 AM Medical Record Number: [id number] Patient Account Number: [id number] Date of Birth/Sex: 08-Apr-1966 (49 y.o. Female) Treating RN: Cornell Barman Primary Care Physician: Arnette Norris Other Clinician: Referring Physician: Arnette Norris Treating Physician/Extender: Benjaman Pott in Treatment: 1 Clinic Level of Care Assessment Items TOOL 4 Quantity Score []  - Use when only an EandM is performed on FOLLOW-UP visit 0 ASSESSMENTS - Nursing Assessment / Reassessment []  - Reassessment of Co-morbidities (includes updates in patient status) 0 X -  Reassessment of Adherence to Treatment Plan 1 5 ASSESSMENTS - Wound and Skin Assessment / Reassessment []  - Simple Wound Assessment / Reassessment - one wound 0 X - Complex Wound Assessment / Reassessment - multiple wounds 9 5 []  - Dermatologic / Skin Assessment (not related to wound area) 0 ASSESSMENTS - Focused Assessment []  - Circumferential Edema Measurements - multi extremities 0 []  - Nutritional Assessment / Counseling / Intervention 0 []  - Lower Extremity Assessment (monofilament, tuning fork, pulses) 0 []  - Peripheral Arterial Disease Assessment (using hand held doppler) 0 ASSESSMENTS - Ostomy and/or Continence Assessment and Care []  - Incontinence Assessment and Management 0 []  - Ostomy Care Assessment and Management (repouching, etc.) 0 PROCESS - Coordination of Care X - Simple Patient / Family Education for ongoing care 1 15 []  - Complex (extensive) Patient / Family Education for ongoing care 0 []  - Staff obtains Programmer, systems, Records, Test Results / Process Orders 0 []  - Staff telephones HHA, Nursing Homes / Clarify orders / etc 0 []  - Routine Transfer to another Facility (non-emergent condition) 0 Ergle, Katherin R. ([id number]) []  - Routine Hospital Admission (non-emergent condition) 0 []  - New Admissions / Biomedical engineer / Ordering NPWT, Apligraf, etc. 0 []  - Emergency Hospital Admission (emergent condition) 0 X - Simple Discharge Coordination 1 10 []  - Complex (extensive) Discharge Coordination 0 PROCESS - Special Needs []  - Pediatric / Minor Patient Management 0 []  - Isolation Patient Management 0 []  - Hearing / Language / Visual special needs 0 []  - Assessment of Community assistance (transportation, D/C planning, etc.) 0 []  - Additional assistance / Altered mentation 0 []  - Support Surface(s) Assessment (bed, cushion, seat, etc.) 0 INTERVENTIONS - Wound Cleansing / Measurement []  - Simple Wound Cleansing - one wound 0 X - Complex Wound Cleansing - multiple  wounds 5 X - Wound Imaging (photographs - any number of wounds) 1 5 []  - Wound Tracing (instead of photographs) 0 []  - Simple Wound  Weeks in Treatment: 1 Wound Status Wound Number: 14 Primary Trauma, Other Etiology: Wound Location: Left Hand - 1st Digit Wound Open Wounding Event: Trauma Status: Date Acquired: 12/04/2014 Comorbid Cataracts, Anemia,  Type II Diabetes, Weeks Of Treatment: 1 History: End Stage Renal Disease, History of Clustered Wound: No Burn, Neuropathy Photos Photo Uploaded By: Montey Hora on 04/30/2015 12:35:02 Wound Measurements Length: (cm) 0.5 Width: (cm) 0.7 Depth: (cm) 0.1 Area: (cm) 0.275 Volume: (cm) 0.027 % Reduction in Area: 35.1% % Reduction in Volume: 35.7% Epithelialization: None Tunneling: No Undermining: No Wound Description Classification: Partial Thickness Wound Margin: Flat and Intact Exudate Amount: Small Exudate Type: Serous Exudate Color: amber Foul Odor After Cleansing: No Wound Bed Granulation Amount: Medium (34-66%) Exposed Structure Granulation Quality: Red Fascia Exposed: No Necrotic Amount: Small (1-33%) Fat Layer Exposed: No Necrotic Quality: Eschar Tendon Exposed: No Dejonge, Tayva R. ([id number]) Muscle Exposed: No Joint Exposed: No Bone Exposed: No Limited to Skin Breakdown Periwound Skin Texture Texture Color No Abnormalities Noted: No No Abnormalities Noted: No Callus: No Atrophie Blanche: No Crepitus: No Cyanosis: No Excoriation: No Ecchymosis: No Fluctuance: No Erythema: No Friable: No Hemosiderin Staining: No Induration: No Mottled: No Localized Edema: No Pallor: No Rash: No Rubor: No Scarring: No Temperature / Pain Moisture Temperature: No Abnormality No Abnormalities Noted: No Dry / Scaly: No Maceration: No Moist: No Wound Preparation Ulcer Cleansing: Rinsed/Irrigated with Saline Topical Anesthetic Applied: None Treatment Notes Wound #14 (Left Hand - 1st Digit) 1. Cleansed with: Clean wound with Normal Saline 4. Dressing Applied: Other dressing (specify in notes) Notes betadine paint Electronic Signature(s) Signed: 04/30/2015 1:12:27 PM By: Montey Hora Entered By: Montey Hora on 04/30/2015 11:46:09 [name]  ([id number]) -------------------------------------------------------------------------------- Wound Assessment Details Patient Name: [name] Date of Service: 04/30/2015 11:30 AM Medical Record Number: [id number] Patient Account Number: [id number] Date of Birth/Sex: Jun 15, 1966 (49 y.o. Female) Treating RN: Montey Hora Primary Care Physician: Arnette Norris Other Clinician: Referring Physician: Arnette Norris Treating Physician/Extender: Judene Companion Weeks in Treatment: 1 Wound Status Wound Number: 15 Primary Trauma, Other Etiology: Wound Location: Left Hand - 2nd Digit Wound Open Wounding Event: Trauma Status: Date Acquired: 12/04/2014 Comorbid Cataracts, Anemia, Type II Diabetes, Weeks Of Treatment: 1 History: End Stage Renal Disease, History of Clustered Wound: No Burn, Neuropathy Photos Photo Uploaded By: Montey Hora on 04/30/2015 12:35:37 Wound Measurements Length: (cm) 1 Width: (cm) 1.1 Depth: (cm) 0.1 Area: (cm) 0.864 Volume: (cm) 0.086 % Reduction in Area: 64.7% % Reduction in Volume: 64.9% Epithelialization: None Tunneling: No Undermining: No Wound Description Classification: Partial Thickness Wound Margin: Flat and Intact Exudate Amount: Small Exudate Type: Serous Exudate Color: amber Foul Odor After Cleansing: No Wound Bed Granulation Amount: Large (67-100%) Exposed Structure Granulation Quality: Red Fascia Exposed: No Necrotic Amount: None Present (0%) Fat Layer Exposed: No Tendon Exposed: No Thayer, Monquie R. ([id number]) Muscle Exposed: No Joint Exposed: No Bone Exposed: No Limited to Skin Breakdown Periwound Skin Texture Texture Color No Abnormalities Noted: No No Abnormalities Noted: No Callus: No Atrophie Blanche: No Crepitus: No Cyanosis: No Excoriation: No Ecchymosis: No Fluctuance: No Erythema: No Friable: No Hemosiderin Staining: No Induration: No Mottled: No Localized Edema: No Pallor: No Rash: No Rubor:  No Scarring: No Temperature / Pain Moisture Temperature: No Abnormality No Abnormalities Noted: No Dry / Scaly: No Maceration: No Moist: No Wound Preparation Ulcer Cleansing: Rinsed/Irrigated with Saline Topical Anesthetic Applied: None Treatment Notes Wound #15 (Left Hand - 2nd Digit) 1. Cleansed with: Clean wound with Normal Saline 4.  Limited to Skin Breakdown Periwound Skin Texture Texture Color No Abnormalities Noted: No No Abnormalities Noted: No Callus: No Atrophie Blanche: No Crepitus: No Cyanosis: No Excoriation: No Ecchymosis: No Fluctuance: No Erythema: No Friable: No Hemosiderin Staining: No Induration: No Mottled: No Localized Edema: No Pallor: No Rash: No Rubor: No Scarring: No Temperature / Pain Moisture Temperature: No Abnormality No Abnormalities Noted: No Tenderness on Palpation: Yes Dry / Scaly: No Maceration: No Moist: No Wound Preparation Ulcer Cleansing: Rinsed/Irrigated with Saline Topical Anesthetic Applied: None Treatment Notes Wound #10 (Right Sacrum) 1. Cleansed with: Clean wound with Normal Saline 4. Dressing Applied: Other dressing (specify in notes) 5. Secondary Dressing Applied Bordered Foam Dressing Notes betadine paint, BFD to sacrum only Electronic Signature(s) Signed: 04/30/2015 1:12:27 PM By: Montey Hora Entered By: Montey Hora on 04/30/2015 11:52:20 [name] ([id number]) -------------------------------------------------------------------------------- Wound Assessment Details Patient Name: [name] Date of Service: 04/30/2015 11:30 AM Medical Record Number: [id number] Patient Account Number: [id number] Date of Birth/Sex: September 06, 1965 (49 y.o. Female) Treating RN: Montey Hora Primary Care Physician: Arnette Norris Other Clinician: Referring Physician: Arnette Norris Treating Physician/Extender: Judene Companion Weeks in Treatment: 1 Wound  Status Wound Number: 11 Primary Pressure Ulcer Etiology: Wound Location: Right Elbow Wound Open Wounding Event: Pressure Injury Status: Date Acquired: 12/04/2014 Comorbid Cataracts, Anemia, Type II Diabetes, Weeks Of Treatment: 1 History: End Stage Renal Disease, History of Clustered Wound: No Burn, Neuropathy Photos Photo Uploaded By: Montey Hora on 04/30/2015 12:34:08 Wound Measurements Length: (cm) 0.6 Width: (cm) 0.9 Depth: (cm) 0.1 Area: (cm) 0.424 Volume: (cm) 0.042 % Reduction in Area: 50.9% % Reduction in Volume: 51.2% Epithelialization: None Tunneling: No Undermining: No Wound Description Classification: Unstageable/Unclassified Wound Margin: Flat and Intact Exudate Amount: None Present Foul Odor After Cleansing: No Wound Bed Granulation Amount: None Present (0%) Exposed Structure Necrotic Amount: Large (67-100%) Fascia Exposed: No Necrotic Quality: Eschar Fat Layer Exposed: No Tendon Exposed: No Muscle Exposed: No Joint Exposed: No Lemire, Akeiba R. ([id number]) Bone Exposed: No Limited to Skin Breakdown Periwound Skin Texture Texture Color No Abnormalities Noted: No No Abnormalities Noted: No Callus: No Atrophie Blanche: No Crepitus: No Cyanosis: No Excoriation: No Ecchymosis: No Fluctuance: No Erythema: No Friable: No Hemosiderin Staining: No Induration: No Mottled: No Localized Edema: No Pallor: No Rash: No Rubor: No Scarring: No Temperature / Pain Moisture Temperature: No Abnormality No Abnormalities Noted: No Dry / Scaly: No Maceration: No Moist: No Wound Preparation Ulcer Cleansing: Rinsed/Irrigated with Saline Topical Anesthetic Applied: None Treatment Notes Wound #11 (Right Elbow) 1. Cleansed with: Clean wound with Normal Saline 4. Dressing Applied: Other dressing (specify in notes) Notes betadine paint Electronic Signature(s) Signed: 04/30/2015 1:12:27 PM By: Montey Hora Entered By: Montey Hora on  04/30/2015 11:45:20 [name] ([id number]) -------------------------------------------------------------------------------- Wound Assessment Details Patient Name: [name] Date of Service: 04/30/2015 11:30 AM Medical Record Number: [id number] Patient Account Number: [id number] Date of Birth/Sex: November 03, 1965 (49 y.o. Female) Treating RN: Montey Hora Primary Care Physician: Arnette Norris Other Clinician: Referring Physician: Arnette Norris Treating Physician/Extender: Judene Companion Weeks in Treatment: 1 Wound Status Wound Number: 12 Primary Trauma, Other Etiology: Wound Location: Right Hand - 1st Digit Wound Open Wounding Event: Trauma Status: Date Acquired: 12/04/2014 Comorbid Cataracts, Anemia, Type II Diabetes, Weeks Of Treatment: 1 History: End Stage Renal Disease, History of Clustered Wound: No Burn, Neuropathy Photos Photo Uploaded By: Montey Hora on 04/30/2015 12:34:09 Wound Measurements Length: (cm) 1.2 Width: (cm) 1.6 Depth: (cm) 0.1 Area: (cm) 1.508 Volume: (cm) 0.151 % Reduction  Weeks in Treatment: 1 Wound Status Wound Number: 14 Primary Trauma, Other Etiology: Wound Location: Left Hand - 1st Digit Wound Open Wounding Event: Trauma Status: Date Acquired: 12/04/2014 Comorbid Cataracts, Anemia,  Type II Diabetes, Weeks Of Treatment: 1 History: End Stage Renal Disease, History of Clustered Wound: No Burn, Neuropathy Photos Photo Uploaded By: Montey Hora on 04/30/2015 12:35:02 Wound Measurements Length: (cm) 0.5 Width: (cm) 0.7 Depth: (cm) 0.1 Area: (cm) 0.275 Volume: (cm) 0.027 % Reduction in Area: 35.1% % Reduction in Volume: 35.7% Epithelialization: None Tunneling: No Undermining: No Wound Description Classification: Partial Thickness Wound Margin: Flat and Intact Exudate Amount: Small Exudate Type: Serous Exudate Color: amber Foul Odor After Cleansing: No Wound Bed Granulation Amount: Medium (34-66%) Exposed Structure Granulation Quality: Red Fascia Exposed: No Necrotic Amount: Small (1-33%) Fat Layer Exposed: No Necrotic Quality: Eschar Tendon Exposed: No Dejonge, Tayva R. ([id number]) Muscle Exposed: No Joint Exposed: No Bone Exposed: No Limited to Skin Breakdown Periwound Skin Texture Texture Color No Abnormalities Noted: No No Abnormalities Noted: No Callus: No Atrophie Blanche: No Crepitus: No Cyanosis: No Excoriation: No Ecchymosis: No Fluctuance: No Erythema: No Friable: No Hemosiderin Staining: No Induration: No Mottled: No Localized Edema: No Pallor: No Rash: No Rubor: No Scarring: No Temperature / Pain Moisture Temperature: No Abnormality No Abnormalities Noted: No Dry / Scaly: No Maceration: No Moist: No Wound Preparation Ulcer Cleansing: Rinsed/Irrigated with Saline Topical Anesthetic Applied: None Treatment Notes Wound #14 (Left Hand - 1st Digit) 1. Cleansed with: Clean wound with Normal Saline 4. Dressing Applied: Other dressing (specify in notes) Notes betadine paint Electronic Signature(s) Signed: 04/30/2015 1:12:27 PM By: Montey Hora Entered By: Montey Hora on 04/30/2015 11:46:09 [name]  ([id number]) -------------------------------------------------------------------------------- Wound Assessment Details Patient Name: [name] Date of Service: 04/30/2015 11:30 AM Medical Record Number: [id number] Patient Account Number: [id number] Date of Birth/Sex: Jun 15, 1966 (49 y.o. Female) Treating RN: Montey Hora Primary Care Physician: Arnette Norris Other Clinician: Referring Physician: Arnette Norris Treating Physician/Extender: Judene Companion Weeks in Treatment: 1 Wound Status Wound Number: 15 Primary Trauma, Other Etiology: Wound Location: Left Hand - 2nd Digit Wound Open Wounding Event: Trauma Status: Date Acquired: 12/04/2014 Comorbid Cataracts, Anemia, Type II Diabetes, Weeks Of Treatment: 1 History: End Stage Renal Disease, History of Clustered Wound: No Burn, Neuropathy Photos Photo Uploaded By: Montey Hora on 04/30/2015 12:35:37 Wound Measurements Length: (cm) 1 Width: (cm) 1.1 Depth: (cm) 0.1 Area: (cm) 0.864 Volume: (cm) 0.086 % Reduction in Area: 64.7% % Reduction in Volume: 64.9% Epithelialization: None Tunneling: No Undermining: No Wound Description Classification: Partial Thickness Wound Margin: Flat and Intact Exudate Amount: Small Exudate Type: Serous Exudate Color: amber Foul Odor After Cleansing: No Wound Bed Granulation Amount: Large (67-100%) Exposed Structure Granulation Quality: Red Fascia Exposed: No Necrotic Amount: None Present (0%) Fat Layer Exposed: No Tendon Exposed: No Thayer, Monquie R. ([id number]) Muscle Exposed: No Joint Exposed: No Bone Exposed: No Limited to Skin Breakdown Periwound Skin Texture Texture Color No Abnormalities Noted: No No Abnormalities Noted: No Callus: No Atrophie Blanche: No Crepitus: No Cyanosis: No Excoriation: No Ecchymosis: No Fluctuance: No Erythema: No Friable: No Hemosiderin Staining: No Induration: No Mottled: No Localized Edema: No Pallor: No Rash: No Rubor:  No Scarring: No Temperature / Pain Moisture Temperature: No Abnormality No Abnormalities Noted: No Dry / Scaly: No Maceration: No Moist: No Wound Preparation Ulcer Cleansing: Rinsed/Irrigated with Saline Topical Anesthetic Applied: None Treatment Notes Wound #15 (Left Hand - 2nd Digit) 1. Cleansed with: Clean wound with Normal Saline 4.  1 Active Inactive Abuse / Safety / Falls / Self Care Management Nursing Diagnoses: Impaired physical mobility Potential for falls Goals: Patient will remain injury free Date Initiated: 04/23/2015 Goal Status: Active Interventions: Assess fall risk on admission and as needed Notes: Orientation to the Wound Care Program Nursing Diagnoses: Knowledge deficit related to the wound healing center program Goals: Patient/caregiver will verbalize  understanding of the [facility] Date Initiated: 04/23/2015 Goal Status: Active Interventions: Provide education on orientation to the wound center Notes: Peripheral Neuropathy Nursing Diagnoses: Potential alteration in peripheral tissue perfusion (select prior to confirmation of diagnosis) Goals: Patient/caregiver will verbalize understanding of disease process and disease management SUSAN, BLEICH ([id number]) Date Initiated: 04/23/2015 Goal Status: Active Interventions: Assess signs and symptoms of neuropathy upon admission and as needed Notes: Pressure Nursing Diagnoses: Potential for impaired tissue integrity related to pressure, friction, moisture, and shear Goals: Patient will remain free from development of additional pressure ulcers Date Initiated: 04/23/2015 Goal Status: Active Interventions: Assess offloading mechanisms upon admission and as needed Notes: Wound/Skin Impairment Nursing Diagnoses: Impaired tissue integrity Goals: Ulcer/skin breakdown will have a volume reduction of 30% by week 4 Date Initiated: 04/23/2015 Goal Status: Active Ulcer/skin breakdown will have a volume reduction of 50% by week 8 Date Initiated: 04/23/2015 Goal Status: Active Ulcer/skin breakdown will have a volume reduction of 80% by week 12 Date Initiated: 04/23/2015 Goal Status: Active Ulcer/skin breakdown will heal within 14 weeks Date Initiated: 04/23/2015 Goal Status: Active Interventions: Assess patient/caregiver ability to obtain necessary supplies Assess ulceration(s) every visit Notes: KENZIE, THORESON ([id number]) Electronic Signature(s) Signed: 04/30/2015 1:12:27 PM By: Montey Hora Entered By: Montey Hora on 04/30/2015 11:52:36 [name] ([id number]) -------------------------------------------------------------------------------- Patient/Caregiver Education Details Patient Name: [name] Date of Service: 04/30/2015 11:30  AM Medical Record Number: [id number] Patient Account Number: [id number] Date of Birth/Gender: December 09, 1965 (49 y.o. Female) Treating RN: Montey Hora Primary Care Physician: Arnette Norris Other Clinician: Referring Physician: Arnette Norris Treating Physician/Extender: Benjaman Pott in Treatment: 1 Education Assessment Education Provided To: Patient and Caregiver Education Topics Provided Safety: Handouts: Other: DO NOT BITE YOUR FINGERS Methods: Explain/Verbal Responses: State content correctly Wound/Skin Impairment: Handouts: Other: wound care as ordered Methods: Demonstration, Explain/Verbal Responses: State content correctly Electronic Signature(s) Signed: 04/30/2015 12:07:17 PM By: Judene Companion MD Entered By: Judene Companion on 04/30/2015 12:07:17 [name] ([id number]) -------------------------------------------------------------------------------- Wound Assessment Details Patient Name: [name] Date of Service: 04/30/2015 11:30 AM Medical Record Number: [id number] Patient Account Number: [id number] Date of Birth/Sex: Jan 20, 1966 (49 y.o. Female) Treating RN: Montey Hora Primary Care Physician: Arnette Norris Other Clinician: Referring Physician: Arnette Norris Treating Physician/Extender: Judene Companion Weeks in Treatment: 1 Wound Status Wound Number: 10 Primary Pressure Ulcer Etiology: Wound Location: Right Sacrum Wound Open Wounding Event: Pressure Injury Status: Date Acquired: 04/01/2015 Comorbid Cataracts, Anemia, Type II Diabetes, Weeks Of Treatment: 1 History: End Stage Renal Disease, History of Clustered Wound: No Burn, Neuropathy Photos Photo Uploaded By: Montey Hora on 04/30/2015 12:33:30 Wound Measurements Length: (cm) 1.4 Width: (cm) 3.4 Depth: (cm) 0.1 Area: (cm) 3.738 Volume: (cm) 0.374 % Reduction in Area: 24.5% % Reduction in Volume: 24.4% Epithelialization: None Wound Description Classification:  Unstageable/Unclassified Wound Margin: Flat and Intact Exudate Amount: None Present Foul Odor After Cleansing: No Wound Bed Granulation Amount: None Present (0%) Exposed Structure Necrotic Amount: Large (67-100%) Fascia Exposed: No Necrotic Quality: Eschar Fat Layer Exposed: No Tendon Exposed: No Muscle Exposed: No Joint Exposed: No [place], [place] ([id number]) Bone Exposed: No  1 Active Inactive Abuse / Safety / Falls / Self Care Management Nursing Diagnoses: Impaired physical mobility Potential for falls Goals: Patient will remain injury free Date Initiated: 04/23/2015 Goal Status: Active Interventions: Assess fall risk on admission and as needed Notes: Orientation to the Wound Care Program Nursing Diagnoses: Knowledge deficit related to the wound healing center program Goals: Patient/caregiver will verbalize  understanding of the [facility] Date Initiated: 04/23/2015 Goal Status: Active Interventions: Provide education on orientation to the wound center Notes: Peripheral Neuropathy Nursing Diagnoses: Potential alteration in peripheral tissue perfusion (select prior to confirmation of diagnosis) Goals: Patient/caregiver will verbalize understanding of disease process and disease management SUSAN, BLEICH ([id number]) Date Initiated: 04/23/2015 Goal Status: Active Interventions: Assess signs and symptoms of neuropathy upon admission and as needed Notes: Pressure Nursing Diagnoses: Potential for impaired tissue integrity related to pressure, friction, moisture, and shear Goals: Patient will remain free from development of additional pressure ulcers Date Initiated: 04/23/2015 Goal Status: Active Interventions: Assess offloading mechanisms upon admission and as needed Notes: Wound/Skin Impairment Nursing Diagnoses: Impaired tissue integrity Goals: Ulcer/skin breakdown will have a volume reduction of 30% by week 4 Date Initiated: 04/23/2015 Goal Status: Active Ulcer/skin breakdown will have a volume reduction of 50% by week 8 Date Initiated: 04/23/2015 Goal Status: Active Ulcer/skin breakdown will have a volume reduction of 80% by week 12 Date Initiated: 04/23/2015 Goal Status: Active Ulcer/skin breakdown will heal within 14 weeks Date Initiated: 04/23/2015 Goal Status: Active Interventions: Assess patient/caregiver ability to obtain necessary supplies Assess ulceration(s) every visit Notes: KENZIE, THORESON ([id number]) Electronic Signature(s) Signed: 04/30/2015 1:12:27 PM By: Montey Hora Entered By: Montey Hora on 04/30/2015 11:52:36 [name] ([id number]) -------------------------------------------------------------------------------- Patient/Caregiver Education Details Patient Name: [name] Date of Service: 04/30/2015 11:30  AM Medical Record Number: [id number] Patient Account Number: [id number] Date of Birth/Gender: December 09, 1965 (49 y.o. Female) Treating RN: Montey Hora Primary Care Physician: Arnette Norris Other Clinician: Referring Physician: Arnette Norris Treating Physician/Extender: Benjaman Pott in Treatment: 1 Education Assessment Education Provided To: Patient and Caregiver Education Topics Provided Safety: Handouts: Other: DO NOT BITE YOUR FINGERS Methods: Explain/Verbal Responses: State content correctly Wound/Skin Impairment: Handouts: Other: wound care as ordered Methods: Demonstration, Explain/Verbal Responses: State content correctly Electronic Signature(s) Signed: 04/30/2015 12:07:17 PM By: Judene Companion MD Entered By: Judene Companion on 04/30/2015 12:07:17 [name] ([id number]) -------------------------------------------------------------------------------- Wound Assessment Details Patient Name: [name] Date of Service: 04/30/2015 11:30 AM Medical Record Number: [id number] Patient Account Number: [id number] Date of Birth/Sex: Jan 20, 1966 (49 y.o. Female) Treating RN: Montey Hora Primary Care Physician: Arnette Norris Other Clinician: Referring Physician: Arnette Norris Treating Physician/Extender: Judene Companion Weeks in Treatment: 1 Wound Status Wound Number: 10 Primary Pressure Ulcer Etiology: Wound Location: Right Sacrum Wound Open Wounding Event: Pressure Injury Status: Date Acquired: 04/01/2015 Comorbid Cataracts, Anemia, Type II Diabetes, Weeks Of Treatment: 1 History: End Stage Renal Disease, History of Clustered Wound: No Burn, Neuropathy Photos Photo Uploaded By: Montey Hora on 04/30/2015 12:33:30 Wound Measurements Length: (cm) 1.4 Width: (cm) 3.4 Depth: (cm) 0.1 Area: (cm) 3.738 Volume: (cm) 0.374 % Reduction in Area: 24.5% % Reduction in Volume: 24.4% Epithelialization: None Wound Description Classification:  Unstageable/Unclassified Wound Margin: Flat and Intact Exudate Amount: None Present Foul Odor After Cleansing: No Wound Bed Granulation Amount: None Present (0%) Exposed Structure Necrotic Amount: Large (67-100%) Fascia Exposed: No Necrotic Quality: Eschar Fat Layer Exposed: No Tendon Exposed: No Muscle Exposed: No Joint Exposed: No [place], [place] ([id number]) Bone Exposed: No  Limited to Skin Breakdown Periwound Skin Texture Texture Color No Abnormalities Noted: No No Abnormalities Noted: No Callus: No Atrophie Blanche: No Crepitus: No Cyanosis: No Excoriation: No Ecchymosis: No Fluctuance: No Erythema: No Friable: No Hemosiderin Staining: No Induration: No Mottled: No Localized Edema: No Pallor: No Rash: No Rubor: No Scarring: No Temperature / Pain Moisture Temperature: No Abnormality No Abnormalities Noted: No Tenderness on Palpation: Yes Dry / Scaly: No Maceration: No Moist: No Wound Preparation Ulcer Cleansing: Rinsed/Irrigated with Saline Topical Anesthetic Applied: None Treatment Notes Wound #10 (Right Sacrum) 1. Cleansed with: Clean wound with Normal Saline 4. Dressing Applied: Other dressing (specify in notes) 5. Secondary Dressing Applied Bordered Foam Dressing Notes betadine paint, BFD to sacrum only Electronic Signature(s) Signed: 04/30/2015 1:12:27 PM By: Montey Hora Entered By: Montey Hora on 04/30/2015 11:52:20 [name] ([id number]) -------------------------------------------------------------------------------- Wound Assessment Details Patient Name: [name] Date of Service: 04/30/2015 11:30 AM Medical Record Number: [id number] Patient Account Number: [id number] Date of Birth/Sex: September 06, 1965 (49 y.o. Female) Treating RN: Montey Hora Primary Care Physician: Arnette Norris Other Clinician: Referring Physician: Arnette Norris Treating Physician/Extender: Judene Companion Weeks in Treatment: 1 Wound  Status Wound Number: 11 Primary Pressure Ulcer Etiology: Wound Location: Right Elbow Wound Open Wounding Event: Pressure Injury Status: Date Acquired: 12/04/2014 Comorbid Cataracts, Anemia, Type II Diabetes, Weeks Of Treatment: 1 History: End Stage Renal Disease, History of Clustered Wound: No Burn, Neuropathy Photos Photo Uploaded By: Montey Hora on 04/30/2015 12:34:08 Wound Measurements Length: (cm) 0.6 Width: (cm) 0.9 Depth: (cm) 0.1 Area: (cm) 0.424 Volume: (cm) 0.042 % Reduction in Area: 50.9% % Reduction in Volume: 51.2% Epithelialization: None Tunneling: No Undermining: No Wound Description Classification: Unstageable/Unclassified Wound Margin: Flat and Intact Exudate Amount: None Present Foul Odor After Cleansing: No Wound Bed Granulation Amount: None Present (0%) Exposed Structure Necrotic Amount: Large (67-100%) Fascia Exposed: No Necrotic Quality: Eschar Fat Layer Exposed: No Tendon Exposed: No Muscle Exposed: No Joint Exposed: No Lemire, Akeiba R. ([id number]) Bone Exposed: No Limited to Skin Breakdown Periwound Skin Texture Texture Color No Abnormalities Noted: No No Abnormalities Noted: No Callus: No Atrophie Blanche: No Crepitus: No Cyanosis: No Excoriation: No Ecchymosis: No Fluctuance: No Erythema: No Friable: No Hemosiderin Staining: No Induration: No Mottled: No Localized Edema: No Pallor: No Rash: No Rubor: No Scarring: No Temperature / Pain Moisture Temperature: No Abnormality No Abnormalities Noted: No Dry / Scaly: No Maceration: No Moist: No Wound Preparation Ulcer Cleansing: Rinsed/Irrigated with Saline Topical Anesthetic Applied: None Treatment Notes Wound #11 (Right Elbow) 1. Cleansed with: Clean wound with Normal Saline 4. Dressing Applied: Other dressing (specify in notes) Notes betadine paint Electronic Signature(s) Signed: 04/30/2015 1:12:27 PM By: Montey Hora Entered By: Montey Hora on  04/30/2015 11:45:20 [name] ([id number]) -------------------------------------------------------------------------------- Wound Assessment Details Patient Name: [name] Date of Service: 04/30/2015 11:30 AM Medical Record Number: [id number] Patient Account Number: [id number] Date of Birth/Sex: November 03, 1965 (49 y.o. Female) Treating RN: Montey Hora Primary Care Physician: Arnette Norris Other Clinician: Referring Physician: Arnette Norris Treating Physician/Extender: Judene Companion Weeks in Treatment: 1 Wound Status Wound Number: 12 Primary Trauma, Other Etiology: Wound Location: Right Hand - 1st Digit Wound Open Wounding Event: Trauma Status: Date Acquired: 12/04/2014 Comorbid Cataracts, Anemia, Type II Diabetes, Weeks Of Treatment: 1 History: End Stage Renal Disease, History of Clustered Wound: No Burn, Neuropathy Photos Photo Uploaded By: Montey Hora on 04/30/2015 12:34:09 Wound Measurements Length: (cm) 1.2 Width: (cm) 1.6 Depth: (cm) 0.1 Area: (cm) 1.508 Volume: (cm) 0.151 % Reduction  1 Active Inactive Abuse / Safety / Falls / Self Care Management Nursing Diagnoses: Impaired physical mobility Potential for falls Goals: Patient will remain injury free Date Initiated: 04/23/2015 Goal Status: Active Interventions: Assess fall risk on admission and as needed Notes: Orientation to the Wound Care Program Nursing Diagnoses: Knowledge deficit related to the wound healing center program Goals: Patient/caregiver will verbalize  understanding of the [facility] Date Initiated: 04/23/2015 Goal Status: Active Interventions: Provide education on orientation to the wound center Notes: Peripheral Neuropathy Nursing Diagnoses: Potential alteration in peripheral tissue perfusion (select prior to confirmation of diagnosis) Goals: Patient/caregiver will verbalize understanding of disease process and disease management SUSAN, BLEICH ([id number]) Date Initiated: 04/23/2015 Goal Status: Active Interventions: Assess signs and symptoms of neuropathy upon admission and as needed Notes: Pressure Nursing Diagnoses: Potential for impaired tissue integrity related to pressure, friction, moisture, and shear Goals: Patient will remain free from development of additional pressure ulcers Date Initiated: 04/23/2015 Goal Status: Active Interventions: Assess offloading mechanisms upon admission and as needed Notes: Wound/Skin Impairment Nursing Diagnoses: Impaired tissue integrity Goals: Ulcer/skin breakdown will have a volume reduction of 30% by week 4 Date Initiated: 04/23/2015 Goal Status: Active Ulcer/skin breakdown will have a volume reduction of 50% by week 8 Date Initiated: 04/23/2015 Goal Status: Active Ulcer/skin breakdown will have a volume reduction of 80% by week 12 Date Initiated: 04/23/2015 Goal Status: Active Ulcer/skin breakdown will heal within 14 weeks Date Initiated: 04/23/2015 Goal Status: Active Interventions: Assess patient/caregiver ability to obtain necessary supplies Assess ulceration(s) every visit Notes: KENZIE, THORESON ([id number]) Electronic Signature(s) Signed: 04/30/2015 1:12:27 PM By: Montey Hora Entered By: Montey Hora on 04/30/2015 11:52:36 [name] ([id number]) -------------------------------------------------------------------------------- Patient/Caregiver Education Details Patient Name: [name] Date of Service: 04/30/2015 11:30  AM Medical Record Number: [id number] Patient Account Number: [id number] Date of Birth/Gender: December 09, 1965 (49 y.o. Female) Treating RN: Montey Hora Primary Care Physician: Arnette Norris Other Clinician: Referring Physician: Arnette Norris Treating Physician/Extender: Benjaman Pott in Treatment: 1 Education Assessment Education Provided To: Patient and Caregiver Education Topics Provided Safety: Handouts: Other: DO NOT BITE YOUR FINGERS Methods: Explain/Verbal Responses: State content correctly Wound/Skin Impairment: Handouts: Other: wound care as ordered Methods: Demonstration, Explain/Verbal Responses: State content correctly Electronic Signature(s) Signed: 04/30/2015 12:07:17 PM By: Judene Companion MD Entered By: Judene Companion on 04/30/2015 12:07:17 [name] ([id number]) -------------------------------------------------------------------------------- Wound Assessment Details Patient Name: [name] Date of Service: 04/30/2015 11:30 AM Medical Record Number: [id number] Patient Account Number: [id number] Date of Birth/Sex: Jan 20, 1966 (49 y.o. Female) Treating RN: Montey Hora Primary Care Physician: Arnette Norris Other Clinician: Referring Physician: Arnette Norris Treating Physician/Extender: Judene Companion Weeks in Treatment: 1 Wound Status Wound Number: 10 Primary Pressure Ulcer Etiology: Wound Location: Right Sacrum Wound Open Wounding Event: Pressure Injury Status: Date Acquired: 04/01/2015 Comorbid Cataracts, Anemia, Type II Diabetes, Weeks Of Treatment: 1 History: End Stage Renal Disease, History of Clustered Wound: No Burn, Neuropathy Photos Photo Uploaded By: Montey Hora on 04/30/2015 12:33:30 Wound Measurements Length: (cm) 1.4 Width: (cm) 3.4 Depth: (cm) 0.1 Area: (cm) 3.738 Volume: (cm) 0.374 % Reduction in Area: 24.5% % Reduction in Volume: 24.4% Epithelialization: None Wound Description Classification:  Unstageable/Unclassified Wound Margin: Flat and Intact Exudate Amount: None Present Foul Odor After Cleansing: No Wound Bed Granulation Amount: None Present (0%) Exposed Structure Necrotic Amount: Large (67-100%) Fascia Exposed: No Necrotic Quality: Eschar Fat Layer Exposed: No Tendon Exposed: No Muscle Exposed: No Joint Exposed: No [place], [place] ([id number]) Bone Exposed: No

## 2015-04-30 NOTE — Progress Notes (Signed)
See i heal 

## 2015-04-30 NOTE — Progress Notes (Signed)
Tonya, [name] ([id number]) Visit Report for 04/30/2015 Chief Complaint Document Details Patient Name: Tonya, [name] Date of Service: 04/30/2015 11:30 AM Medical Record Number: [id number] Patient Account Number: [id number] Date of Birth/Sex: Aug 14, 1966 (49 y.o. Female) Treating RN: Montey Hora Primary Care Physician: Arnette Norris Other Clinician: Referring Physician: Arnette Norris Treating Physician/Extender: Benjaman Pott in Treatment: 1 Information Obtained from: Patient Chief Complaint Patient presents to the wound care center for a consult due non healing wound The patient has one area on her right gluteal region and several fingers involved with ulcerations. This has been there for about 3 months. Electronic Signature(s) Signed: 04/30/2015 12:02:08 PM By: Judene Companion MD Entered By: Judene Companion on 04/30/2015 12:02:08 SHANYIA, STINES ([id number]) -------------------------------------------------------------------------------- HPI Details Patient Name: [name] Date of Service: 04/30/2015 11:30 AM Medical Record Number: [id number] Patient Account Number: [id number] Date of Birth/Sex: [birth year] (49 y.o. Female) Treating RN: Montey Hora Primary Care Physician: Arnette Norris Other Clinician: Referring Physician: Arnette Norris Treating Physician/Extender: Benjaman Pott in Treatment: 1 History of Present Illness Location: right gluteal area and 3 fingers on her right hand and 4 fingers on her left hand involved with ulceration. Quality: Patient reports No Pain. Severity: Patient states wound are getting worse. Duration: Patient has had the wound for > 3 months prior to seeking treatment at the wound center Context: The wound appeared gradually over time Modifying Factors: Patient is currently on renal dialysis and receives treatments 3 times weekly HPI Description: Pleasant 49 year old patient who is known to the wound clinic from previous  visits. She has significant diabetic neuropathy and has no sensation in her hands and feet. She is wheelchair-bound and takes dialysis on a regular basis 3 times a week. 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 Past medical history significant for diabetes mellitus,end-stage renal disease on hemodialysis, coronary artery disease, multiple amputations, sleep apnea, wheelchair bound due to severe peripheral neuropathy. She has now declined quite significantly and is wheelchair-bound all the time. She has developed decubitus ulcers of the skin and has been referred to [place] by her PCP. She was also recently seen by Dr. Celesta Gentile for her feet and he noticed that she had a ulceration of the right third toe and put her on Levaquin and local care. She says she is laying down in bed much more now and has been trying to offload as much as possible. Due to a psychiatric issue she has been biting her fingernails and has caused much ulceration to both hands. Part of it may be related to the medication she was on. Electronic Signature(s) Signed: 04/30/2015 12:03:00 PM By: Judene Companion MD Entered By: Judene Companion on 04/30/2015 12:03:00 HEIDI, MACLIN ([id number]) -------------------------------------------------------------------------------- Physical Exam Details Patient Name: [name] Date of Service: 04/30/2015 11:30 AM Medical Record Number: [id number] Patient Account Number: [id number] Date of Birth/Sex: [birth year] (49 y.o. Female) Treating RN: Montey Hora Primary Care Physician: Arnette Norris Other Clinician: Referring Physician: Arnette Norris Treating Physician/Extender: Benjaman Pott in Treatment: 1 Electronic Signature(s) Signed: 04/30/2015 12:03:08 PM By: Judene Companion MD Entered By: Judene Companion on 04/30/2015 12:03:08 NIKIAH, GOIN ([id number]) -------------------------------------------------------------------------------- Physician Orders Details Patient Name: [name] Date of Service: 04/30/2015 11:30 AM Medical Record Number: [id number] Patient Account Number: [id number] Date of Birth/Sex: 10-16-1965 (49 y.o. Female) Treating RN: Montey Hora Primary Care Physician: Deborra Medina,  Jung.Claude Other Clinician: Referring Physician: Arnette Norris Treating Physician/Extender: Benjaman Pott in Treatment: 1 Verbal / Phone Orders: Yes Clinician: Dorthy, Joanna Read Back and Verified: Yes Diagnosis Coding Wound Cleansing Wound #10 Right Sacrum o Clean wound with Normal Saline. Wound #11 Right Elbow o Clean wound with Normal Saline. Wound #12 Right Hand - 1st Digit o Clean wound with Normal Saline. Wound #13 Right Hand - 2nd Digit o Clean wound with Normal Saline. Wound #14 Left Hand - 1st Digit o Clean wound with Normal Saline. Wound #15 Left Hand - 2nd Digit o Clean wound with Normal Saline. Wound #16 Left Hand - 3rd Digit o Clean wound with Normal Saline. Wound #17 Left Hand - 4th Digit o Clean wound with Normal Saline. Wound #18 Right Hand - 3rd Digit o Clean wound with Normal Saline. Primary Wound Dressing Wound #10 Right Sacrum o Other: - betadine Wound #11 Right Elbow o Other: - betadine Wound #12 Right Hand - 1st Digit o Other: - betadine Dorff, Girl R. ([id number]) Wound #13 Right Hand - 2nd Digit o Other: - betadine Wound #14 Left Hand - 1st Digit o Other: - betadine Wound #15 Left Hand - 2nd Digit o Other: - betadine Wound #16 Left Hand - 3rd Digit o Other: - betadine Wound #17 Left Hand - 4th Digit o Other: - betadine Wound #18 Right Hand - 3rd Digit o Other: - betadine Secondary Dressing Wound #10 Right Sacrum o Boardered Foam Dressing Dressing Change Frequency Wound #10 Right Sacrum o Change dressing every day. Wound #11 Right  Elbow o Change dressing every day. Wound #12 Right Hand - 1st Digit o Change dressing every day. Wound #13 Right Hand - 2nd Digit o Change dressing every day. Wound #14 Left Hand - 1st Digit o Change dressing every day. Wound #15 Left Hand - 2nd Digit o Change dressing every day. Wound #16 Left Hand - 3rd Digit o Change dressing every day. Wound #17 Left Hand - 4th Digit o Change dressing every day. Wound #18 Right Hand - 3rd Digit Mcconaughey, Angeliyah R. ([id number]) o Change dressing every day. Follow-up Appointments Wound #10 Right Sacrum o Return Appointment in 1 week. Wound #11 Right Elbow o Return Appointment in 1 week. Wound #12 Right Hand - 1st Digit o Return Appointment in 1 week. Wound #13 Right Hand - 2nd Digit o Return Appointment in 1 week. Wound #14 Left Hand - 1st Digit o Return Appointment in 1 week. Wound #15 Left Hand - 2nd Digit o Return Appointment in 1 week. Wound #16 Left Hand - 3rd Digit o Return Appointment in 1 week. Wound #17 Left Hand - 4th Digit o Return Appointment in 1 week. Wound #18 Right Hand - 3rd Digit o Return Appointment in 1 week. Off-Loading Wound #10 Right Sacrum o Turn and reposition every 2 hours o Mattress - appropriate support surface for mattress Electronic Signature(s) Signed: 04/30/2015 1:12:27 PM By: Montey Hora Entered By: Montey Hora on 04/30/2015 11:59:53 [name] ([id number]) -------------------------------------------------------------------------------- Problem List Details Patient Name: [name] Date of Service: 04/30/2015 11:30 AM Medical Record Number: [id number] Patient Account Number: [id number] Date of Birth/Sex: 05-09-1966 (49 y.o. Female) Treating RN: Montey Hora Primary Care Physician: Arnette Norris Other Clinician: Referring Physician: Arnette Norris Treating Physician/Extender: Benjaman Pott in Treatment: 1 Active  Problems ICD-10 Encounter Code Description Active Date Diagnosis E11.40 Type 2 diabetes mellitus with diabetic neuropathy, 04/23/2015 Yes unspecified Z99.2 Dependence on renal dialysis 04/23/2015 Yes L89.310 Pressure ulcer  of right buttock, unstageable 04/23/2015 Yes S61.300A Unspecified open wound of right index finger with damage 04/23/2015 Yes to nail, initial encounter S61.301A Unspecified open wound of left index finger with damage 04/23/2015 Yes to nail, initial encounter S61.307A Unspecified open wound of left little finger with damage to 04/23/2015 Yes nail, initial encounter S61.303A Unspecified open wound of left middle finger with damage 04/23/2015 Yes to nail, initial encounter Inactive Problems Resolved Problems Electronic Signature(s) Signed: 04/30/2015 12:01:43 PM By: Judene Companion MD Entered By: Judene Companion on 04/30/2015 12:01:43 QUINCEE, GITTENS ([id number], HANOVER ([id number]) -------------------------------------------------------------------------------- Progress Note Details Patient Name: [name] Date of Service: 04/30/2015 11:30 AM Medical Record Number: [id number] Patient Account Number: [id number] Date of Birth/Sex: Nov 19, 1965 (49 y.o. Female) Treating RN: Montey Hora Primary Care Physician: Arnette Norris Other Clinician: Referring Physician: Arnette Norris Treating Physician/Extender: Benjaman Pott in Treatment: 1 Subjective Chief Complaint Information obtained from Patient Patient presents to the wound care center for a consult due non healing wound The patient has one area on her right gluteal region and several fingers involved with ulcerations. This has been there for about 3 months. History of Present Illness (HPI) The following HPI elements were documented for the patient's wound: Location: right gluteal area and 3 fingers on her right hand and 4 fingers on her left hand involved with ulceration. Quality: Patient  reports No Pain. Severity: Patient states wound are getting worse. Duration: Patient has had the wound for > 3 months prior to seeking treatment at the wound center Context: The wound appeared gradually over time Modifying Factors: Patient is currently on renal dialysis and receives treatments 3 times weekly Pleasant 49 year old patient who is known to the wound clinic from previous visits. She has significant diabetic neuropathy and has no sensation in her hands and feet. She is wheelchair-bound and takes dialysis on a regular basis 3 times a week. 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 Past medical history significant for diabetes mellitus,end-stage renal disease on hemodialysis, coronary artery disease, multiple amputations, sleep apnea, wheelchair bound due to severe peripheral neuropathy. She has now declined quite significantly and is wheelchair-bound all the time. She has developed decubitus ulcers of the skin and has been referred to [place] by her PCP. She was also recently seen by Dr. Celesta Gentile for her feet and he noticed that she had a ulceration of the right third toe and put her on Levaquin and local care. She says she is laying down in bed much more now and has been trying to offload as much as possible. Due to a psychiatric issue she has been biting her fingernails and has caused much ulceration to both hands. Part of it may be related to the medication she was on. Objective VERNEDA, HOLLOPETER R. ([id number]) Constitutional Vitals Time Taken: 11:34 AM, Height: 68 in, Weight: 220 lbs, BMI: 33.4, Temperature: 98.3 F, Pulse: 64 bpm, Respiratory Rate: 18 breaths/min, Blood Pressure: 90/44 mmHg. Integumentary (Hair, Skin) Wound #10 status is Open. Original cause of wound was Pressure Injury. The wound is located on the Right Sacrum. The wound  measures 1.4cm length x 3.4cm width x 0.1cm depth; 3.738cm^2 area and 0.374cm^3 volume. The wound is limited to skin breakdown. There is a none present amount of drainage noted. The wound margin is flat and intact. There is no granulation within the wound bed. There is  a large (67-100%) amount of necrotic tissue within the wound bed including Eschar. The periwound skin appearance did not exhibit: Callus, Crepitus, Excoriation, Fluctuance, Friable, Induration, Localized Edema, Rash, Scarring, Dry/Scaly, Maceration, Moist, Atrophie Blanche, Cyanosis, Ecchymosis, Hemosiderin Staining, Mottled, Pallor, Rubor, Erythema. Periwound temperature was noted as No Abnormality. The periwound has tenderness on palpation. Wound #11 status is Open. Original cause of wound was Pressure Injury. The wound is located on the Right Elbow. The wound measures 0.6cm length x 0.9cm width x 0.1cm depth; 0.424cm^2 area and 0.042cm^3 volume. The wound is limited to skin breakdown. There is no tunneling or undermining noted. There is a none present amount of drainage noted. The wound margin is flat and intact. There is no granulation within the wound bed. There is a large (67-100%) amount of necrotic tissue within the wound bed including Eschar. The periwound skin appearance did not exhibit: Callus, Crepitus, Excoriation, Fluctuance, Friable, Induration, Localized Edema, Rash, Scarring, Dry/Scaly, Maceration, Moist, Atrophie Blanche, Cyanosis, Ecchymosis, Hemosiderin Staining, Mottled, Pallor, Rubor, Erythema. Periwound temperature was noted as No Abnormality. Wound #12 status is Open. Original cause of wound was Trauma. The wound is located on the Right Hand - 1st Digit. The wound measures 1.2cm length x 1.6cm width x 0.1cm depth; 1.508cm^2 area and 0.151cm^3 volume. The wound is limited to skin breakdown. There is no tunneling or undermining noted. There is a medium amount of serous drainage noted. The wound margin is flat  and intact. There is medium (34-66%) red granulation within the wound bed. There is a medium (34-66%) amount of necrotic tissue within the wound bed including Eschar. The periwound skin appearance did not exhibit: Callus, Crepitus, Excoriation, Fluctuance, Friable, Induration, Localized Edema, Rash, Scarring, Dry/Scaly, Maceration, Moist, Atrophie Blanche, Cyanosis, Ecchymosis, Hemosiderin Staining, Mottled, Pallor, Rubor, Erythema. Periwound temperature was noted as No Abnormality. Wound #13 status is Open. Original cause of wound was Trauma. The wound is located on the Right Hand - 2nd Digit. The wound measures 0.6cm length x 0.7cm width x 0.1cm depth; 0.33cm^2 area and 0.033cm^3 volume. The wound is limited to skin breakdown. There is no tunneling or undermining noted. There is a small amount of serous drainage noted. The wound margin is flat and intact. There is large (67- 100%) red granulation within the wound bed. There is no necrotic tissue within the wound bed. The periwound skin appearance did not exhibit: Callus, Crepitus, Excoriation, Fluctuance, Friable, Induration, Localized Edema, Rash, Scarring, Dry/Scaly, Maceration, Moist, Atrophie Blanche, Cyanosis, Ecchymosis, Hemosiderin Staining, Mottled, Pallor, Rubor, Erythema. Periwound temperature was noted as No Abnormality. Wound #14 status is Open. Original cause of wound was Trauma. The wound is located on the Left Hand - 1st Digit. The wound measures 0.5cm length x 0.7cm width x 0.1cm depth; 0.275cm^2 area and 0.027cm^3 volume. The wound is limited to skin breakdown. There is no tunneling or undermining noted. There is a small amount of serous drainage noted. The wound margin is flat and intact. There is medium (34-66%) red Harmsen, Sheyanne R. ([id number]) granulation within the wound bed. There is a small (1-33%) amount of necrotic tissue within the wound bed including Eschar. The periwound skin appearance did not exhibit: Callus,  Crepitus, Excoriation, Fluctuance, Friable, Induration, Localized Edema, Rash, Scarring, Dry/Scaly, Maceration, Moist, Atrophie Blanche, Cyanosis, Ecchymosis, Hemosiderin Staining, Mottled, Pallor, Rubor, Erythema. Periwound temperature was noted as No Abnormality. Wound #15 status is Open. Original cause of wound was Trauma. The wound is located on the Left Hand - 2nd Digit. The wound measures 1cm length  x 1.1cm width x 0.1cm depth; 0.864cm^2 area and 0.086cm^3 volume. The wound is limited to skin breakdown. There is no tunneling or undermining noted. There is a small amount of serous drainage noted. The wound margin is flat and intact. There is large (67-100%) red granulation within the wound bed. There is no necrotic tissue within the wound bed. The periwound skin appearance did not exhibit: Callus, Crepitus, Excoriation, Fluctuance, Friable, Induration, Localized Edema, Rash, Scarring, Dry/Scaly, Maceration, Moist, Atrophie Blanche, Cyanosis, Ecchymosis, Hemosiderin Staining, Mottled, Pallor, Rubor, Erythema. Periwound temperature was noted as No Abnormality. Wound #16 status is Open. Original cause of wound was Trauma. The wound is located on the Left Hand - 3rd Digit. The wound measures 0.3cm length x 0.7cm width x 0.1cm depth; 0.165cm^2 area and 0.016cm^3 volume. The wound is limited to skin breakdown. There is no tunneling or undermining noted. There is a small amount of serous drainage noted. The wound margin is flat and intact. There is large (67-100%) red granulation within the wound bed. There is no necrotic tissue within the wound bed. The periwound skin appearance did not exhibit: Callus, Crepitus, Excoriation, Fluctuance, Friable, Induration, Localized Edema, Rash, Scarring, Dry/Scaly, Maceration, Moist, Atrophie Blanche, Cyanosis, Ecchymosis, Hemosiderin Staining, Mottled, Pallor, Rubor, Erythema. Periwound temperature was noted as No Abnormality. Wound #17 status is Open.  Original cause of wound was Trauma. The wound is located on the Left Hand - 4th Digit. The wound measures 1.1cm length x 1.2cm width x 0.1cm depth; 1.037cm^2 area and 0.104cm^3 volume. The wound is limited to skin breakdown. There is no tunneling or undermining noted. There is a small amount of serous drainage noted. The wound margin is flat and intact. There is large (67-100%) red granulation within the wound bed. There is no necrotic tissue within the wound bed. The periwound skin appearance did not exhibit: Callus, Crepitus, Excoriation, Fluctuance, Friable, Induration, Localized Edema, Rash, Scarring, Dry/Scaly, Maceration, Moist, Atrophie Blanche, Cyanosis, Ecchymosis, Hemosiderin Staining, Mottled, Pallor, Rubor, Erythema. Periwound temperature was noted as No Abnormality. Wound #18 status is Open. Original cause of wound was Trauma. The wound is located on the Right Hand - 3rd Digit. The wound measures 0.7cm length x 1cm width x 0.1cm depth; 0.55cm^2 area and 0.055cm^3 volume. The wound is limited to skin breakdown. There is no tunneling or undermining noted. There is a none present amount of drainage noted. The wound margin is flat and intact. There is no granulation within the wound bed. There is a large (67-100%) amount of necrotic tissue within the wound bed including Eschar. The periwound skin appearance did not exhibit: Callus, Crepitus, Excoriation, Fluctuance, Friable, Induration, Localized Edema, Rash, Scarring, Dry/Scaly, Maceration, Moist, Atrophie Blanche, Cyanosis, Ecchymosis, Hemosiderin Staining, Mottled, Pallor, Rubor, Erythema. Periwound temperature was noted as No Abnormality. Assessment Active Problems LEILANY, DIGERONIMO. ([id number]) ICD-10 E11.40 - Type 2 diabetes mellitus with diabetic neuropathy, unspecified Z99.2 - Dependence on renal dialysis L89.310 - Pressure ulcer of right buttock, unstageable S61.300A - Unspecified open wound of right index finger with  damage to nail, initial encounter S61.301A - Unspecified open wound of left index finger with damage to nail, initial encounter S61.307A - Unspecified open wound of left little finger with damage to nail, initial encounter S61.303A - Unspecified open wound of left middle finger with damage to nail, initial encounter Plan Wound Cleansing: Wound #10 Right Sacrum: Clean wound with Normal Saline. Wound #11 Right Elbow: Clean wound with Normal Saline. Wound #12 Right Hand - 1st Digit: Clean wound with Normal Saline.  Wound #13 Right Hand - 2nd Digit: Clean wound with Normal Saline. Wound #14 Left Hand - 1st Digit: Clean wound with Normal Saline. Wound #15 Left Hand - 2nd Digit: Clean wound with Normal Saline. Wound #16 Left Hand - 3rd Digit: Clean wound with Normal Saline. Wound #17 Left Hand - 4th Digit: Clean wound with Normal Saline. Wound #18 Right Hand - 3rd Digit: Clean wound with Normal Saline. Primary Wound Dressing: Wound #10 Right Sacrum: Other: - betadine Wound #11 Right Elbow: Other: - betadine Wound #12 Right Hand - 1st Digit: Other: - betadine Wound #13 Right Hand - 2nd Digit: Other: - betadine Wound #14 Left Hand - 1st Digit: Other: - betadine Wound #15 Left Hand - 2nd Digit: Other: - betadine Wound #16 Left Hand - 3rd Digit: Other: - betadine Bogacki, Ryleah R. ([id number]) Wound #17 Left Hand - 4th Digit: Other: - betadine Wound #18 Right Hand - 3rd Digit: Other: - betadine Secondary Dressing: Wound #10 Right Sacrum: Boardered Foam Dressing Dressing Change Frequency: Wound #10 Right Sacrum: Change dressing every day. Wound #11 Right Elbow: Change dressing every day. Wound #12 Right Hand - 1st Digit: Change dressing every day. Wound #13 Right Hand - 2nd Digit: Change dressing every day. Wound #14 Left Hand - 1st Digit: Change dressing every day. Wound #15 Left Hand - 2nd Digit: Change dressing every day. Wound #16 Left Hand - 3rd Digit: Change  dressing every day. Wound #17 Left Hand - 4th Digit: Change dressing every day. Wound #18 Right Hand - 3rd Digit: Change dressing every day. Follow-up Appointments: Wound #10 Right Sacrum: Return Appointment in 1 week. Wound #11 Right Elbow: Return Appointment in 1 week. Wound #12 Right Hand - 1st Digit: Return Appointment in 1 week. Wound #13 Right Hand - 2nd Digit: Return Appointment in 1 week. Wound #14 Left Hand - 1st Digit: Return Appointment in 1 week. Wound #15 Left Hand - 2nd Digit: Return Appointment in 1 week. Wound #16 Left Hand - 3rd Digit: Return Appointment in 1 week. Wound #17 Left Hand - 4th Digit: Return Appointment in 1 week. Wound #18 Right Hand - 3rd Digit: Return Appointment in 1 week. Off-Loading: Wound #10 Right Sacrum: Turn and reposition every 2 hours Mattress - appropriate support surface for mattress Hoose, Xan R. ([id number]) pressure ulcer right buttuck probable stage 3. Also traumatic wounds fingers from her constantly bitting nails. Treat with betadine. Electronic Signature(s) Signed: 04/30/2015 12:05:50 PM By: Judene Companion MD Entered By: Judene Companion on 04/30/2015 12:05:50 JAYDENCE, VANYO ([id number]) -------------------------------------------------------------------------------- SuperBill Details Patient Name: [name] Date of Service: 04/30/2015 Medical Record Number: [id number] Patient Account Number: [id number] Date of Birth/Sex: 02-04-1966 (49 y.o. Female) Treating RN: Montey Hora Primary Care Physician: Arnette Norris Other Clinician: Referring Physician: Arnette Norris Treating Physician/Extender: Benjaman Pott in Treatment: 1 Diagnosis Coding ICD-10 Codes Code Description E11.40 Type 2 diabetes mellitus with diabetic neuropathy, unspecified Z99.2 Dependence on renal dialysis L89.310 Pressure ulcer of right buttock, unstageable S61.300A Unspecified open wound of right index finger with damage to nail,  initial encounter S61.301A Unspecified open wound of left index finger with damage to nail, initial encounter S61.307A Unspecified open wound of left little finger with damage to nail, initial encounter S61.303A Unspecified open wound of left middle finger with damage to nail, initial encounter Facility Procedures CPT4 Code: [id number] Description: 50277 - WOUND CARE VISIT-LEV 5 EST PT Modifier: Quantity: 1 Physician Procedures CPT4 Code: [id number] Description: 76720 - WC PHYS LEVEL 2 - EST  PT ICD-10 Description Diagnosis L89.310 Pressure ulcer of right buttock, unstageable Modifier: Quantity: 1 Electronic Signature(s) Signed: 04/30/2015 12:42:01 PM By: Gretta Cool, RN, BSN, Kim RN, BSN Previous Signature: 04/30/2015 12:06:19 PM Version By: Judene Companion MD Entered By: Gretta Cool, RN, BSN, Kim on 04/30/2015 12:10:30

## 2015-05-07 ENCOUNTER — Ambulatory Visit: Payer: Medicare Other | Admitting: Surgery

## 2015-05-07 ENCOUNTER — Ambulatory Visit (INDEPENDENT_AMBULATORY_CARE_PROVIDER_SITE_OTHER): Payer: Medicare Other

## 2015-05-07 ENCOUNTER — Encounter: Payer: Self-pay | Admitting: Podiatry

## 2015-05-07 ENCOUNTER — Ambulatory Visit (INDEPENDENT_AMBULATORY_CARE_PROVIDER_SITE_OTHER): Payer: Medicare Other | Admitting: Podiatry

## 2015-05-07 DIAGNOSIS — R52 Pain, unspecified: Secondary | ICD-10-CM

## 2015-05-07 DIAGNOSIS — L97512 Non-pressure chronic ulcer of other part of right foot with fat layer exposed: Secondary | ICD-10-CM | POA: Diagnosis not present

## 2015-05-07 DIAGNOSIS — E1140 Type 2 diabetes mellitus with diabetic neuropathy, unspecified: Secondary | ICD-10-CM

## 2015-05-07 DIAGNOSIS — L03031 Cellulitis of right toe: Secondary | ICD-10-CM | POA: Diagnosis not present

## 2015-05-07 DIAGNOSIS — Z899 Acquired absence of limb, unspecified: Secondary | ICD-10-CM

## 2015-05-07 DIAGNOSIS — E1149 Type 2 diabetes mellitus with other diabetic neurological complication: Secondary | ICD-10-CM

## 2015-05-07 DIAGNOSIS — L02611 Cutaneous abscess of right foot: Secondary | ICD-10-CM

## 2015-05-07 MED ORDER — CLINDAMYCIN HCL 300 MG PO CAPS: 300.0000 mg | ORAL_CAPSULE | Freq: Three times a day (TID) | ORAL | Status: DC

## 2015-05-07 NOTE — Patient Instructions (Signed)
Monitor for any clinical signs or symptoms of infection and directed to call the office immediately should any occur or go to the ER.  

## 2015-05-09 DIAGNOSIS — Z899 Acquired absence of limb, unspecified: Secondary | ICD-10-CM | POA: Insufficient documentation

## 2015-05-09 DIAGNOSIS — E1149 Type 2 diabetes mellitus with other diabetic neurological complication: Secondary | ICD-10-CM | POA: Insufficient documentation

## 2015-05-09 NOTE — Progress Notes (Signed)
Patient ID: [name], female   DOB: Aug 12, 1966, 49 y.o.   MRN: [id number]  Subjective: 49 year old female presents the office today for follow-up evaluation in ulceration to the right third toe. She says overall she is doing well and she has not noticed any drainage, erythema, red streaks, edema to the area. Also since last appointment did get new diabetic shoes which she states appear to be fitting well. She is continuing to keep a Band-Aid overlying the right third toe. She denies any systemic complaints as fevers, chills, nausea, vomiting. No calf pain, chest pain, soreness of breath. She did go to the wound care center since last appointment for other wounds however she states that did not look at the foot.  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 There is hammertoe contractures of the lesser digits. On the dorsal aspect of the right third toe there is a hyperkeratotic lesion. Upon debridement there was purulence expressed and underlying ulceration over the PIPJ. The wound probes very close to bone however does not probe to bone at this time. There is no tunneling or undermining. After the lesion was debrided the areas clean no further purulence was expressed. There is no surrounding erythema, ascending cellulitis, fluctuance, crepitus, malodor. There continues to be pre-ulcerative lesions to the medial aspect left hallux and the left medial ankle. There is no underlying ulceration, drainage or other signs of infection at this time. No other open lesions or pre-ulcerative lesions are identified bilaterally No pain with calf compression, swelling, warmth, erythema.  Assessment: 49 year old female right third toe ulceration with infection  Plan: -X-rays were obtained and reviewed with the patient.  -Treatment options  discussed including all alternatives, risks, and complications -The wound was cultured given the purulence the right third toe. There is debrided and cleaned. Silvadene was applied followed by dry sterile dressing. Recommend continue with daily dressing changes. -Start clindamycin -I discussed the patient that she is at high likelihood of amputation of the third toe. She became upset and his conversation how she understands that this is a likelihood. -Continue to monitor the pre-ulcerative lesions. -Monitor for any clinical signs or symptoms of infection and directed to call the office immediately should any occur or go to the ER. -Follow-up in 1 week or sooner if any problems arise. In the meantime, encouraged to call the office with any questions, concerns, change in symptoms.   Celesta Gentile, DPM

## 2015-05-13 ENCOUNTER — Telehealth: Payer: Self-pay | Admitting: *Deleted

## 2015-05-13 NOTE — Telephone Encounter (Signed)
Nurse Practitoner Ms. Owens Shark asked for culture results and the antibiotic pt was given at last visit to treat right 3rd toe.  I informed her of the Clindamycin and dosing, told her the preliminary results were not in for the culture, but I would have the [facility] staff call when available.

## 2015-05-14 ENCOUNTER — Encounter: Payer: Medicare Other | Attending: Surgery | Admitting: Surgery

## 2015-05-14 DIAGNOSIS — S61303D Unspecified open wound of left middle finger with damage to nail, subsequent encounter: Secondary | ICD-10-CM | POA: Insufficient documentation

## 2015-05-14 DIAGNOSIS — S61301D Unspecified open wound of left index finger with damage to nail, subsequent encounter: Secondary | ICD-10-CM | POA: Diagnosis not present

## 2015-05-14 DIAGNOSIS — L89310 Pressure ulcer of right buttock, unstageable: Secondary | ICD-10-CM | POA: Insufficient documentation

## 2015-05-14 DIAGNOSIS — X58XXXD Exposure to other specified factors, subsequent encounter: Secondary | ICD-10-CM | POA: Diagnosis not present

## 2015-05-14 DIAGNOSIS — E1140 Type 2 diabetes mellitus with diabetic neuropathy, unspecified: Secondary | ICD-10-CM | POA: Diagnosis not present

## 2015-05-14 DIAGNOSIS — G4733 Obstructive sleep apnea (adult) (pediatric): Secondary | ICD-10-CM | POA: Diagnosis not present

## 2015-05-14 DIAGNOSIS — N186 End stage renal disease: Secondary | ICD-10-CM | POA: Diagnosis not present

## 2015-05-14 DIAGNOSIS — Z992 Dependence on renal dialysis: Secondary | ICD-10-CM | POA: Insufficient documentation

## 2015-05-14 DIAGNOSIS — S61307D Unspecified open wound of left little finger with damage to nail, subsequent encounter: Secondary | ICD-10-CM | POA: Insufficient documentation

## 2015-05-14 DIAGNOSIS — I2510 Atherosclerotic heart disease of native coronary artery without angina pectoris: Secondary | ICD-10-CM | POA: Insufficient documentation

## 2015-05-14 DIAGNOSIS — E1122 Type 2 diabetes mellitus with diabetic chronic kidney disease: Secondary | ICD-10-CM | POA: Insufficient documentation

## 2015-05-14 DIAGNOSIS — Z794 Long term (current) use of insulin: Secondary | ICD-10-CM | POA: Insufficient documentation

## 2015-05-14 DIAGNOSIS — S61300D Unspecified open wound of right index finger with damage to nail, subsequent encounter: Secondary | ICD-10-CM | POA: Insufficient documentation

## 2015-05-15 NOTE — Progress Notes (Addendum)
[name] ([id number]) Visit Report for 05/14/2015 Chief Complaint Document Details Patient Name: [name], [name] Date of Service: 05/14/2015 10:45 AM Medical Record Number: [id number] Patient Account Number: [id number] Date of Birth/Sex: 09/22/1965 (49 y.o. Female) Treating RN: Baruch Gouty, RN, BSN, Velva Harman Primary Care Physician: Arnette Norris Other Clinician: Referring Physician: Arnette Norris Treating Physician/Extender: Frann Rider in Treatment: 3 Information Obtained from: Patient Chief Complaint Patient presents to the wound care center for a consult due non healing wound The patient has one area on her right gluteal region and several fingers involved with ulcerations. This has been there for about 3 months. Electronic Signature(s) Signed: 05/14/2015 11:25:57 AM By: [name] MD, FACS Entered By: [name] on 05/14/2015 11:25:56 [name], [name] ([id number]) -------------------------------------------------------------------------------- HPI Details Patient Name: [name] Date of Service: 05/14/2015 10:45 AM Medical Record Number: [id number] Patient Account Number: [id number] Date of Birth/Sex: [birth year] (49 y.o. Female) Treating RN: Baruch Gouty, RN, BSN, Velva Harman Primary Care Physician: Arnette Norris Other Clinician: Referring Physician: Arnette Norris Treating Physician/Extender: Frann Rider in Treatment: 3 History of Present Illness Location: right gluteal area and 3 fingers on her right hand and 4 fingers on her left hand involved with ulceration. Quality: Patient reports No Pain. Severity: Patient states wound are getting worse. Duration: Patient has had the wound for > 3 months prior to seeking treatment at the wound center Context: The wound appeared gradually over time Modifying Factors: Patient is currently on renal dialysis and receives treatments 3 times weekly HPI Description: Pleasant 49 year old patient who is known to the wound clinic  from previous visits. She has significant diabetic neuropathy and has no sensation in her hands and feet. She is wheelchair-bound and takes dialysis on a regular basis 3 times a week. 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 Past medical history significant for diabetes mellitus,end-stage renal disease on hemodialysis, coronary artery disease, multiple amputations, sleep apnea, wheelchair bound due to severe peripheral neuropathy. She has now declined quite significantly and is wheelchair-bound all the time. She has developed decubitus ulcers of the skin and has been referred to [place] by her PCP. She was also recently seen by Dr. Celesta Gentile for her feet and he noticed that she had a ulceration of the right third toe and put her on Levaquin and local care. She says she is laying down in bed much more now and has been trying to offload as much as possible. Due to a psychiatric issue she has been biting her fingernails and has caused much ulceration to both hands. Part of it may be related to the medication she was on. 05/14/2015 -- she says she is very sore around her anus and the vagina and has a lot of redness and problems there. I have reviewed her x-rays which were done of both hands and they show: X-ray of the left hand shows no acute fracture, dislocation or convincing osteomyelitis. X-ray of the right hand shows deformities of the distal phalanx of the first second and third digits with peripheral vascular disease but no evidence of acute fracture dislocation or osteomyelitis. Electronic Signature(s) Signed: 05/14/2015 11:26:47 AM By: [name] MD, FACS Entered By: [name] on 05/14/2015 11:26:47 [name], [name] ([id number]) -------------------------------------------------------------------------------- Physical Exam Details Patient Name:  [name] Date of Service: 05/14/2015 10:45 AM Medical Record Number: [id number] Patient Account Number: [id number] Date of  Birth/Sex: Aug 16, 1966 (49 y.o. Female) Treating RN: Baruch Gouty, RN, BSN, Velva Harman Primary Care Physician: Arnette Norris Other Clinician: Referring Physician: Arnette Norris Treating Physician/Extender: Frann Rider in Treatment: 3 Constitutional . Pulse regular. Respirations normal and unlabored. Afebrile. . Eyes Nonicteric. Reactive to light. Ears, Nose, Mouth, and Throat Lips, teeth, and gums WNL.Marland Kitchen Moist mucosa without lesions . Neck supple and nontender. No palpable supraclavicular or cervical adenopathy. Normal sized without goiter. Respiratory WNL. No retractions.. Cardiovascular Pedal Pulses WNL. No clubbing, cyanosis or edema. Lymphatic No adneopathy. No adenopathy. No adenopathy. Musculoskeletal Adexa without tenderness or enlargement.. Digits and nails w/o clubbing, cyanosis, infection, petechiae, ischemia, or inflammatory conditions.. Integumentary (Hair, Skin) No suspicious lesions. No crepitus or fluctuance. No peri-wound warmth or erythema. No masses.Marland Kitchen Psychiatric Judgement and insight Intact.. No evidence of depression, anxiety, or agitation.. Notes The fingertips have dry eschar and they have not changed in any way. The right gluteal area has a dry eschar which is quite tender to touch. In the perianal region she has got a lot of excoriation and fungal infection and this goes down towards the perineum. Electronic Signature(s) Signed: 05/14/2015 11:28:46 AM By: [name] MD, FACS Entered By: [name] on 05/14/2015 11:28:46 [name] ([id number]) -------------------------------------------------------------------------------- Physician Orders Details Patient Name: [name] Date of Service: 05/14/2015 10:45 AM Medical Record Number: [id number] Patient Account Number: [id number] Date of Birth/Sex: November 18, 1965 (49  y.o. Female) Treating RN: Baruch Gouty, RN, BSN, Velva Harman Primary Care Physician: Arnette Norris Other Clinician: Referring Physician: Arnette Norris Treating Physician/Extender: Frann Rider in Treatment: 3 Verbal / Phone Orders: Yes Clinician: Afful, RN, BSN, Rita Read Back and Verified: Yes Diagnosis Coding Wound Cleansing Wound #10 Right Sacrum o Clean wound with Normal Saline. Wound #11 Right Elbow o Clean wound with Normal Saline. Wound #12 Right Hand - 1st Digit o Clean wound with Normal Saline. Wound #13 Right Hand - 2nd Digit o Clean wound with Normal Saline. Wound #14 Left Hand - 1st Digit o Clean wound with Normal Saline. Wound #15 Left Hand - 2nd Digit o Clean wound with Normal Saline. Wound #16 Left Hand - 3rd Digit o Clean wound with Normal Saline. Wound #17 Left Hand - 4th Digit o Clean wound with Normal Saline. Wound #18 Right Hand - 3rd Digit o Clean wound with Normal Saline. Primary Wound Dressing Wound #10 Right Sacrum o Other: - betadine Wound #11 Right Elbow o Other: - betadine Wound #12 Right Hand - 1st Digit o Other: - betadine [name], Tonya R. ([id number]) Wound #13 Right Hand - 2nd Digit o Other: - betadine Wound #14 Left Hand - 1st Digit o Other: - betadine Wound #15 Left Hand - 2nd Digit o Other: - betadine Wound #16 Left Hand - 3rd Digit o Other: - betadine Wound #17 Left Hand - 4th Digit o Other: - betadine Wound #18 Right Hand - 3rd Digit o Other: - betadine Secondary Dressing Wound #10 Right Sacrum o Boardered Foam Dressing Dressing Change Frequency Wound #10 Right Sacrum o Change dressing every day. Wound #11 Right Elbow o Change dressing every day. Wound #12 Right Hand - 1st Digit o Change dressing every day. Wound #13 Right Hand - 2nd Digit o Change dressing every day. Wound #14 Left Hand - 1st Digit o Change dressing every day. Wound #15 Left Hand - 2nd Digit o Change  dressing every day. Wound #16 Left Hand - 3rd Digit o Change dressing every day. Wound #17 Left Hand - 4th Digit o Change dressing  every day. Wound #18 Right Hand - 3rd Digit [name], Tonya R. ([id number]) o Change dressing every day. Follow-up Appointments Wound #10 Right Sacrum o Return Appointment in 1 week. Wound #11 Right Elbow o Return Appointment in 1 week. Wound #12 Right Hand - 1st Digit o Return Appointment in 1 week. Wound #13 Right Hand - 2nd Digit o Return Appointment in 1 week. Wound #14 Left Hand - 1st Digit o Return Appointment in 1 week. Wound #15 Left Hand - 2nd Digit o Return Appointment in 1 week. Wound #16 Left Hand - 3rd Digit o Return Appointment in 1 week. Wound #17 Left Hand - 4th Digit o Return Appointment in 1 week. Wound #18 Right Hand - 3rd Digit o Return Appointment in 1 week. Off-Loading Wound #10 Right Sacrum o Turn and reposition every 2 hours o Mattress - appropriate support surface for mattress Electronic Signature(s) Signed: 05/14/2015 4:28:23 PM By: [name] MD, FACS Signed: 05/14/2015 4:45:16 PM By: Regan Lemming BSN, RN Entered By: Regan Lemming on 05/14/2015 11:22:12 [name], [name] ([id number]) -------------------------------------------------------------------------------- Problem List Details Patient Name: [name] Date of Service: 05/14/2015 10:45 AM Medical Record Number: [id number] Patient Account Number: [id number] Date of Birth/Sex: 12-14-1965 (49 y.o. Female) Treating RN: Afful, RN, BSN, Patterson Primary Care Physician: Arnette Norris Other Clinician: Referring Physician: Arnette Norris Treating Physician/Extender: Frann Rider in Treatment: 3 Active Problems ICD-10 Encounter Code Description Active Date Diagnosis E11.40 Type 2 diabetes mellitus with diabetic neuropathy, 04/23/2015 Yes unspecified Z99.2 Dependence on renal dialysis 04/23/2015 Yes L89.310 Pressure ulcer of right  buttock, unstageable 04/23/2015 Yes S61.300A Unspecified open wound of right index finger with damage 04/23/2015 Yes to nail, initial encounter S61.301A Unspecified open wound of left index finger with damage 04/23/2015 Yes to nail, initial encounter S61.307A Unspecified open wound of left little finger with damage to 04/23/2015 Yes nail, initial encounter S61.303A Unspecified open wound of left middle finger with damage 04/23/2015 Yes to nail, initial encounter Inactive Problems Resolved Problems Electronic Signature(s) Signed: 05/14/2015 11:25:45 AM By: [name] MD, FACS Entered By: [name] on 05/14/2015 11:25:44 [name] ([id number] [name] ([id number]) -------------------------------------------------------------------------------- Progress Note Details Patient Name: [name] Date of Service: 05/14/2015 10:45 AM Medical Record Number: [id number] Patient Account Number: [id number] Date of Birth/Sex: 1966/04/30 (49 y.o. Female) Treating RN: Baruch Gouty, RN, BSN, Velva Harman Primary Care Physician: Arnette Norris Other Clinician: Referring Physician: Arnette Norris Treating Physician/Extender: Frann Rider in Treatment: 3 Subjective Chief Complaint Information obtained from Patient Patient presents to the wound care center for a consult due non healing wound The patient has one area on her right gluteal region and several fingers involved with ulcerations. This has been there for about 3 months. History of Present Illness (HPI) The following HPI elements were documented for the patient's wound: Location: right gluteal area and 3 fingers on her right hand and 4 fingers on her left hand involved with ulceration. Quality: Patient reports No Pain. Severity: Patient states wound are getting worse. Duration: Patient has had the wound for > 3 months prior to seeking treatment at the wound center Context: The wound appeared gradually over time Modifying  Factors: Patient is currently on renal dialysis and receives treatments 3 times weekly Pleasant 49 year old patient who is known to the wound clinic from previous visits. She has significant diabetic neuropathy and has no sensation in her hands and feet. She is wheelchair-bound and takes dialysis on a regular basis 3 times a week. Has been previously  treated for multiple injuries to both hands and was here at the wound center for an osteomyelitis of her right hand and took hyperbaric oxygen therapy. Past medical history significant for diabetes mellitus,end-stage renal disease on hemodialysis, coronary artery disease, multiple amputations, sleep apnea, wheelchair bound due to severe peripheral neuropathy. She has now declined quite significantly and is wheelchair-bound all the time. She has developed decubitus ulcers of the skin and has been referred to [place] by her PCP. She was also recently seen by Dr. Celesta Gentile for her feet and he noticed that she had a ulceration of the right third toe and put her on Levaquin and local care. She says she is laying down in bed much more now and has been trying to offload as much as possible. Due to a psychiatric issue she has been biting her fingernails and has caused much ulceration to both hands. Part of it may be related to the medication she was on. 05/14/2015 -- she says she is very sore around her anus and the vagina and has a lot of redness and problems there. I have reviewed her x-rays which were done of both hands and they show: X-ray of the left hand shows no acute fracture, dislocation or convincing osteomyelitis. X-ray of the right hand shows deformities of the distal phalanx of the first second and third digits with peripheral vascular disease but no evidence of acute fracture dislocation or osteomyelitis. ULDINE, FUSTER R. ([id number]) Objective Constitutional Pulse regular. Respirations normal and unlabored. Afebrile. Vitals Time  Taken: 10:55 AM, Height: 68 in, Weight: 220 lbs, BMI: 33.4, Temperature: 97.8 F, Pulse: 66 bpm, Respiratory Rate: 17 breaths/min, Blood Pressure: 102/38 mmHg. Eyes Nonicteric. Reactive to light. Ears, Nose, Mouth, and Throat Lips, teeth, and gums WNL.Marland Kitchen Moist mucosa without lesions . Neck supple and nontender. No palpable supraclavicular or cervical adenopathy. Normal sized without goiter. Respiratory WNL. No retractions.. Cardiovascular Pedal Pulses WNL. No clubbing, cyanosis or edema. Lymphatic No adneopathy. No adenopathy. No adenopathy. Musculoskeletal Adexa without tenderness or enlargement.. Digits and nails w/o clubbing, cyanosis, infection, petechiae, ischemia, or inflammatory conditions.Marland Kitchen Psychiatric Judgement and insight Intact.. No evidence of depression, anxiety, or agitation.. General Notes: The fingertips have dry eschar and they have not changed in any way. The right gluteal area has a dry eschar which is quite tender to touch. In the perianal region she has got a lot of excoriation and fungal infection and this goes down towards the perineum. Integumentary (Hair, Skin) No suspicious lesions. No crepitus or fluctuance. No peri-wound warmth or erythema. No masses.. Wound #10 status is Open. Original cause of wound was Pressure Injury. The wound is located on the [place]. ([id number]) Right Sacrum. The wound measures 1.4cm length x 3.2cm width x 0.1cm depth; 3.519cm^2 area and 0.352cm^3 volume. The wound is limited to skin breakdown. There is no undermining noted. There is a none present amount of drainage noted. The wound margin is flat and intact. There is no granulation within the wound bed. There is a large (67-100%) amount of necrotic tissue within the wound bed including Eschar. The periwound skin appearance exhibited: Dry/Scaly. The periwound skin appearance did not exhibit: Callus, Crepitus, Excoriation, Fluctuance, Friable, Induration, Localized Edema,  Rash, Scarring, Maceration, Moist, Atrophie Blanche, Cyanosis, Ecchymosis, Hemosiderin Staining, Mottled, Pallor, Rubor, Erythema. Periwound temperature was noted as No Abnormality. The periwound has tenderness on palpation. Wound #11 status is Open. Original cause of wound was Pressure Injury. The wound is located on the Right Elbow. The wound  measures 0.1cm length x 0.1cm width x 0.1cm depth; 0.008cm^2 area and 0.001cm^3 volume. The wound is limited to skin breakdown. There is no undermining noted. There is a none present amount of drainage noted. The wound margin is flat and intact. There is no granulation within the wound bed. There is a large (67-100%) amount of necrotic tissue within the wound bed including Eschar. The periwound skin appearance exhibited: Dry/Scaly. The periwound skin appearance did not exhibit: Callus, Crepitus, Excoriation, Fluctuance, Friable, Induration, Localized Edema, Rash, Scarring, Maceration, Moist, Atrophie Blanche, Cyanosis, Ecchymosis, Hemosiderin Staining, Mottled, Pallor, Rubor, Erythema. Periwound temperature was noted as No Abnormality. Wound #12 status is Open. Original cause of wound was Trauma. The wound is located on the Right Hand - 1st Digit. The wound measures 1cm length x 1.5cm width x 0.1cm depth; 1.178cm^2 area and 0.118cm^3 volume. The wound is limited to skin breakdown. There is no tunneling or undermining noted. There is a none present amount of drainage noted. The wound margin is flat and intact. There is medium (34-66%) red granulation within the wound bed. There is a medium (34-66%) amount of necrotic tissue within the wound bed including Eschar. The periwound skin appearance exhibited: Dry/Scaly. The periwound skin appearance did not exhibit: Callus, Crepitus, Excoriation, Fluctuance, Friable, Induration, Localized Edema, Rash, Scarring, Maceration, Moist, Atrophie Blanche, Cyanosis, Ecchymosis, Hemosiderin Staining, Mottled, Pallor,  Rubor, Erythema. Periwound temperature was noted as No Abnormality. Wound #13 status is Open. Original cause of wound was Trauma. The wound is located on the Right Hand - 2nd Digit. The wound measures 0.6cm length x 0.7cm width x 0.1cm depth; 0.33cm^2 area and 0.033cm^3 volume. The wound is limited to skin breakdown. There is no tunneling or undermining noted. There is a none present amount of drainage noted. The wound margin is flat and intact. There is large (67- 100%) red granulation within the wound bed. There is no necrotic tissue within the wound bed. The periwound skin appearance exhibited: Dry/Scaly. The periwound skin appearance did not exhibit: Callus, Crepitus, Excoriation, Fluctuance, Friable, Induration, Localized Edema, Rash, Scarring, Maceration, Moist, Atrophie Blanche, Cyanosis, Ecchymosis, Hemosiderin Staining, Mottled, Pallor, Rubor, Erythema. Periwound temperature was noted as No Abnormality. Wound #14 status is Open. Original cause of wound was Trauma. The wound is located on the Left Hand - 1st Digit. The wound measures 0.8cm length x 1cm width x 0.1cm depth; 0.628cm^2 area and 0.063cm^3 volume. The wound is limited to skin breakdown. There is no tunneling or undermining noted. There is a none present amount of drainage noted. The wound margin is flat and intact. There is medium (34-66%) red granulation within the wound bed. There is a small (1-33%) amount of necrotic tissue within the wound bed including Eschar. The periwound skin appearance exhibited: Dry/Scaly. The periwound skin appearance did not exhibit: Callus, Crepitus, Excoriation, Fluctuance, Friable, Induration, Localized Edema, Rash, Scarring, Maceration, Moist, Atrophie Blanche, Cyanosis, Ecchymosis, Hemosiderin Staining, Mottled, Pallor, Rubor, Erythema. Periwound temperature was noted as No Abnormality. Wound #15 status is Open. Original cause of wound was Trauma. The wound is located on the Left Hand  - [name], Tonya R. ([id number]) 2nd Digit. The wound measures 1cm length x 1.6cm width x 0.1cm depth; 1.257cm^2 area and 0.126cm^3 volume. The wound is limited to skin breakdown. There is no tunneling or undermining noted. There is a none present amount of drainage noted. The wound margin is flat and intact. There is large (67-100%) granulation within the wound bed. There is no necrotic tissue within the wound bed. The  periwound skin appearance exhibited: Dry/Scaly. The periwound skin appearance did not exhibit: Callus, Crepitus, Excoriation, Fluctuance, Friable, Induration, Localized Edema, Rash, Scarring, Maceration, Moist, Atrophie Blanche, Cyanosis, Ecchymosis, Hemosiderin Staining, Mottled, Pallor, Rubor, Erythema. Periwound temperature was noted as No Abnormality. Wound #16 status is Open. Original cause of wound was Trauma. The wound is located on the Left Hand - 3rd Digit. The wound measures 0.5cm length x 0.7cm width x 0.1cm depth; 0.275cm^2 area and 0.027cm^3 volume. The wound is limited to skin breakdown. There is no tunneling or undermining noted. There is a none present amount of drainage noted. The wound margin is flat and intact. There is large (67-100%) red granulation within the wound bed. There is no necrotic tissue within the wound bed. The periwound skin appearance exhibited: Dry/Scaly. The periwound skin appearance did not exhibit: Callus, Crepitus, Excoriation, Fluctuance, Friable, Induration, Localized Edema, Rash, Scarring, Maceration, Moist, Atrophie Blanche, Cyanosis, Ecchymosis, Hemosiderin Staining, Mottled, Pallor, Rubor, Erythema. Periwound temperature was noted as No Abnormality. Wound #17 status is Open. Original cause of wound was Trauma. The wound is located on the Left Hand - 4th Digit. The wound measures 0.7cm length x 0.8cm width x 0.1cm depth; 0.44cm^2 area and 0.044cm^3 volume. The wound is limited to skin breakdown. There is no tunneling or undermining  noted. There is a none present amount of drainage noted. The wound margin is flat and intact. There is large (67-100%) red granulation within the wound bed. There is no necrotic tissue within the wound bed. The periwound skin appearance exhibited: Dry/Scaly. The periwound skin appearance did not exhibit: Callus, Crepitus, Excoriation, Fluctuance, Friable, Induration, Localized Edema, Rash, Scarring, Maceration, Moist, Atrophie Blanche, Cyanosis, Ecchymosis, Hemosiderin Staining, Mottled, Pallor, Rubor, Erythema. Periwound temperature was noted as No Abnormality. Wound #18 status is Open. Original cause of wound was Trauma. The wound is located on the Right Hand - 3rd Digit. The wound measures 0.5cm length x 1cm width x 0.1cm depth; 0.393cm^2 area and 0.039cm^3 volume. The wound is limited to skin breakdown. There is no tunneling or undermining noted. There is a none present amount of drainage noted. The wound margin is flat and intact. There is no granulation within the wound bed. There is a large (67-100%) amount of necrotic tissue within the wound bed including Eschar. The periwound skin appearance exhibited: Dry/Scaly. The periwound skin appearance did not exhibit: Callus, Crepitus, Excoriation, Fluctuance, Friable, Induration, Localized Edema, Rash, Scarring, Maceration, Moist, Atrophie Blanche, Cyanosis, Ecchymosis, Hemosiderin Staining, Mottled, Pallor, Rubor, Erythema. Periwound temperature was noted as No Abnormality. Assessment Active Problems ICD-10 E11.40 - Type 2 diabetes mellitus with diabetic neuropathy, unspecified Z99.2 - Dependence on renal dialysis L89.310 - Pressure ulcer of right buttock, unstageable [name], Tonya R. ([id number]) S61.300A - Unspecified open wound of right index finger with damage to nail, initial encounter S61.301A - Unspecified open wound of left index finger with damage to nail, initial encounter S61.307A - Unspecified open wound of left little  finger with damage to nail, initial encounter S61.303A - Unspecified open wound of left middle finger with damage to nail, initial encounter The patient is deteriorating quite a bit and has significant comorbidities. I have recommended offloading and using Betadine over the dry eschar on the right gluteal region and also have asked her to change position as often as possible. As far as her fingertips go we will continue to paint with Betadine. He is urged to call her PCP regarding her fungal infection in the perineum and she possibly needs both oral and local  anti-fungal ointment. She will come back and see as next week. Plan Wound Cleansing: Wound #10 Right Sacrum: Clean wound with Normal Saline. Wound #11 Right Elbow: Clean wound with Normal Saline. Wound #12 Right Hand - 1st Digit: Clean wound with Normal Saline. Wound #13 Right Hand - 2nd Digit: Clean wound with Normal Saline. Wound #14 Left Hand - 1st Digit: Clean wound with Normal Saline. Wound #15 Left Hand - 2nd Digit: Clean wound with Normal Saline. Wound #16 Left Hand - 3rd Digit: Clean wound with Normal Saline. Wound #17 Left Hand - 4th Digit: Clean wound with Normal Saline. Wound #18 Right Hand - 3rd Digit: Clean wound with Normal Saline. Primary Wound Dressing: Wound #10 Right Sacrum: Other: - betadine Wound #11 Right Elbow: Other: - betadine Wound #12 Right Hand - 1st Digit: Other: - betadine Mey, Tonya R. ([id number]) Wound #13 Right Hand - 2nd Digit: Other: - betadine Wound #14 Left Hand - 1st Digit: Other: - betadine Wound #15 Left Hand - 2nd Digit: Other: - betadine Wound #16 Left Hand - 3rd Digit: Other: - betadine Wound #17 Left Hand - 4th Digit: Other: - betadine Wound #18 Right Hand - 3rd Digit: Other: - betadine Secondary Dressing: Wound #10 Right Sacrum: Boardered Foam Dressing Dressing Change Frequency: Wound #10 Right Sacrum: Change dressing every day. Wound #11 Right  Elbow: Change dressing every day. Wound #12 Right Hand - 1st Digit: Change dressing every day. Wound #13 Right Hand - 2nd Digit: Change dressing every day. Wound #14 Left Hand - 1st Digit: Change dressing every day. Wound #15 Left Hand - 2nd Digit: Change dressing every day. Wound #16 Left Hand - 3rd Digit: Change dressing every day. Wound #17 Left Hand - 4th Digit: Change dressing every day. Wound #18 Right Hand - 3rd Digit: Change dressing every day. Follow-up Appointments: Wound #10 Right Sacrum: Return Appointment in 1 week. Wound #11 Right Elbow: Return Appointment in 1 week. Wound #12 Right Hand - 1st Digit: Return Appointment in 1 week. Wound #13 Right Hand - 2nd Digit: Return Appointment in 1 week. Wound #14 Left Hand - 1st Digit: Return Appointment in 1 week. Wound #15 Left Hand - 2nd Digit: Return Appointment in 1 week. Wound #16 Left Hand - 3rd Digit: Return Appointment in 1 week. Wound #17 Left Hand - 4th Digit: Return Appointment in 1 week. ALEXISMARIE, [name] ([id number]) Wound #18 Right Hand - 3rd Digit: Return Appointment in 1 week. Off-Loading: Wound #10 Right Sacrum: Turn and reposition every 2 hours Mattress - appropriate support surface for mattress The patient is deteriorating quite a bit and has significant comorbidities. I have recommended offloading and using Betadine over the dry eschar on the right gluteal region and also have asked her to change position as often as possible. As far as her fingertips go we will continue to paint with Betadine. 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 She will come back and see as next week. Electronic Signature(s) Signed: 05/16/2015 4:26:55 PM By: [name] MD, FACS Previous Signature: 05/14/2015 11:30:18 AM Version By: [name] MD, FACS Entered By: [name] on 05/16/2015 16:26:54 [name], [name] ([id number]) -------------------------------------------------------------------------------- SuperBill Details Patient Name: [name] Date of Service: 05/14/2015 Medical Record Number: [id number] Patient Account Number: [id number] Date of Birth/Sex: [birth year] (49 y.o. 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 Pressure ulcer of right buttock, unstageable S61.300A Unspecified open wound of right index finger with damage to nail, initial encounter S61.301A Unspecified open wound of left index finger with damage to nail, initial encounter S61.307A Unspecified open wound of left little finger with damage to nail, initial encounter S61.303A Unspecified open wound of left middle finger with damage to nail, initial encounter Facility Procedures CPT4 Code: [id number] Description: 07225 - WOUND CARE VISIT-LEV 5 EST PT Modifier: Quantity: 1 Physician Procedures CPT4: Description Modifier Quantity Code [id number] - WC PHYS LEVEL 3 - EST PT 1 ICD-10 Description Diagnosis E11.40 Type 2 diabetes mellitus with diabetic neuropathy, unspecified L89.310 Pressure ulcer of right buttock, unstageable S61.300A  Unspecified open wound of right index finger with damage to nail, initial encounter S61.301A Unspecified open wound of left index finger with damage to nail, initial encounter Electronic Signature(s) Signed: 05/14/2015 11:30:39 AM By: [name] MD, FACS Entered By: [name] on 05/14/2015 11:30:39

## 2015-05-15 NOTE — Progress Notes (Addendum)
Wound Measurements Length: (cm) 1 Width: (cm) 1.6 Depth: (cm) 0.1 Area: (cm) 1.257 Volume: (cm) 0.126 % Reduction in Area: 48.7% % Reduction in Volume: 48.6% Epithelialization: None Tunneling: No Undermining: No Wound Description Classification: Partial Thickness Wound Margin: Flat and Intact Exudate Amount: None Present Foul Odor After Cleansing: No Wound Bed Granulation Amount: Large (67-100%) Exposed Structure Necrotic Amount: None Present (0%) Fascia Exposed: No Fat Layer Exposed: No Tendon Exposed: No Muscle Exposed: No Joint Exposed: No Bone Exposed: No [name], Tonya R. ([id number]) Limited to Skin Breakdown Periwound Skin Texture Texture Color No Abnormalities Noted: No No Abnormalities Noted: No Callus: No Atrophie Blanche: No Crepitus: No Cyanosis: No Excoriation: No Ecchymosis: No Fluctuance: No Erythema: No Friable: No Hemosiderin Staining: No Induration: No Mottled: No Localized Edema: No Pallor: No Rash: No Rubor: No Scarring: No Temperature / Pain Moisture Temperature: No Abnormality No Abnormalities Noted: No Dry / Scaly: Yes Maceration: No Moist: No Wound Preparation Ulcer Cleansing: Rinsed/Irrigated with Saline Topical Anesthetic Applied: None Treatment Notes Wound #15 (Left Hand - 2nd Digit) 1. Cleansed with: Clean wound with Normal Saline 4. Dressing Applied: Other dressing (specify in notes) 5. Secondary Dressing Applied Bordered Foam Dressing Notes betadine paint, BFD to sacrum only Electronic Signature(s) Signed: 05/15/2015 4:50:17 PM By: Regan Lemming BSN, RN Previous Signature: 05/14/2015 4:45:16 PM Version By: Regan Lemming BSN, RN Entered By: Regan Lemming on 05/15/2015 16:50:16 [name]  ([id number]) -------------------------------------------------------------------------------- Wound Assessment Details Patient Name: [name] Date of Service: 05/14/2015 10:45 AM Medical Record Number: [id number] Patient Account Number: [id number] Date of Birth/Sex: [birth year] (49 y.o. Female) Treating RN: Afful, RN, BSN, [place] Primary Care Physician: Arnette Norris Other Clinician: Referring Physician: Arnette Norris Treating Physician/Extender: Frann Rider in Treatment: 3 Wound Status Wound Number: 16 Primary Trauma, Other Etiology: Wound Location: Left Hand - 3rd Digit Wound Open Wounding Event: Trauma Status: Date Acquired: 12/04/2014 Comorbid Cataracts, Anemia, Type II Diabetes, Weeks Of Treatment: 3 History: End Stage Renal Disease, History of Clustered Wound: No Burn, Neuropathy Photos Wound Measurements Length: (cm) 0.5 Width: (cm) 0.7 Depth: (cm) 0.1 Area: (cm) 0.275 Volume: (cm) 0.027 % Reduction in Area: -29.7% % Reduction in Volume: -28.6% Epithelialization: None Tunneling: No Undermining: No Wound Description Classification: Partial Thickness Wound Margin: Flat and Intact Exudate Amount: None Present Foul Odor After Cleansing: No Wound Bed Granulation Amount: Large (67-100%) Exposed Structure Granulation Quality: Red Fascia Exposed: No Necrotic Amount: None Present (0%) Fat Layer Exposed: No Tendon Exposed: No Muscle Exposed: No Joint Exposed: No Bone Exposed: No [name], Tonya R. ([id number]) Limited to Skin Breakdown Periwound Skin Texture Texture Color No Abnormalities Noted: No No Abnormalities Noted: No Callus: No Atrophie Blanche: No Crepitus: No Cyanosis: No Excoriation: No Ecchymosis: No Fluctuance: No Erythema: No Friable: No Hemosiderin Staining: No Induration: No Mottled: No Localized Edema: No Pallor: No Rash: No Rubor: No Scarring: No Temperature / Pain Moisture Temperature: No Abnormality No  Abnormalities Noted: No Dry / Scaly: Yes Maceration: No Moist: No Wound Preparation Ulcer Cleansing: Rinsed/Irrigated with Saline Topical Anesthetic Applied: None Treatment Notes Wound #16 (Left Hand - 3rd Digit) 1. Cleansed with: Clean wound with Normal Saline 4. Dressing Applied: Other dressing (specify in notes) 5. Secondary Dressing Applied Bordered Foam Dressing Notes betadine paint, BFD to sacrum only Electronic Signature(s) Signed: 05/15/2015 4:50:53 PM By: Regan Lemming BSN, RN Previous Signature: 05/14/2015 4:45:16 PM Version By: Regan Lemming BSN, RN Entered By: Regan Lemming on 05/15/2015 16:50:53 [place] ([id number]) -------------------------------------------------------------------------------- Wound Assessment Details  Patient Name: [name], [name] Date of Service: 05/14/2015 10:45 AM Medical Record Number: [id number] Patient Account Number: [id number] Date of Birth/Sex: 1966/01/22 (49 y.o. Female) Treating RN: Afful, RN, BSN, [place] Primary Care Physician: Arnette Norris Other Clinician: Referring Physician: Arnette Norris Treating Physician/Extender: Frann Rider in Treatment: 3 Wound Status Wound Number: 17 Primary Trauma, Other Etiology: Wound Location: Left Hand - 4th Digit Wound Open Wounding Event: Trauma Status: Date Acquired: 12/04/2014 Comorbid Cataracts, Anemia, Type II Diabetes, Weeks Of Treatment: 3 History: End Stage Renal Disease, History of Clustered Wound: No Burn, Neuropathy Photos Wound Measurements Length: (cm) 0.7 Width: (cm) 0.8 Depth: (cm) 0.1 Area: (cm) 0.44 Volume: (cm) 0.044 % Reduction in Area: 29.9% % Reduction in Volume: 30.2% Epithelialization: None Tunneling: No Undermining: No Wound Description Classification: Partial Thickness Wound Margin: Flat and Intact Exudate Amount: None Present Foul Odor After Cleansing: No Wound Bed Granulation Amount: Large (67-100%) Exposed Structure Granulation Quality:  Red Fascia Exposed: No Necrotic Amount: None Present (0%) Fat Layer Exposed: No Tendon Exposed: No Muscle Exposed: No Joint Exposed: No Bone Exposed: No [name], Tonya R. ([id number]) Limited to Skin Breakdown Periwound Skin Texture Texture Color No Abnormalities Noted: No No Abnormalities Noted: No Callus: No Atrophie Blanche: No Crepitus: No Cyanosis: No Excoriation: No Ecchymosis: No Fluctuance: No Erythema: No Friable: No Hemosiderin Staining: No Induration: No Mottled: No Localized Edema: No Pallor: No Rash: No Rubor: No Scarring: No Temperature / Pain Moisture Temperature: No Abnormality No Abnormalities Noted: No Dry / Scaly: Yes Maceration: No Moist: No Wound Preparation Ulcer Cleansing: Rinsed/Irrigated with Saline Topical Anesthetic Applied: None Treatment Notes Wound #17 (Left Hand - 4th Digit) 1. Cleansed with: Clean wound with Normal Saline 4. Dressing Applied: Other dressing (specify in notes) 5. Secondary Dressing Applied Bordered Foam Dressing Notes betadine paint, BFD to sacrum only Electronic Signature(s) Signed: 05/15/2015 4:51:39 PM By: Regan Lemming BSN, RN Previous Signature: 05/14/2015 4:45:16 PM Version By: Regan Lemming BSN, RN Entered By: Regan Lemming on 05/15/2015 16:51:39 [name], [name] ([id number]) -------------------------------------------------------------------------------- Wound Assessment Details Patient Name: [name] Date of Service: 05/14/2015 10:45 AM Medical Record Number: [id number] Patient Account Number: [id number] Date of Birth/Sex: [birth year] (49 y.o. Female) Treating RN: Afful, RN, BSN, [place] Primary Care Physician: Arnette Norris Other Clinician: Referring Physician: Arnette Norris Treating Physician/Extender: Frann Rider in Treatment: 3 Wound Status Wound Number: 18 Primary Trauma, Other Etiology: Wound Location: Right Hand - 3rd Digit Wound Open Wounding Event: Trauma Status: Date  Acquired: 04/29/2015 Comorbid Cataracts, Anemia, Type II Diabetes, Weeks Of Treatment: 2 History: End Stage Renal Disease, History of Clustered Wound: No Burn, Neuropathy Photos Wound Measurements Length: (cm) 0.5 Width: (cm) 1 Depth: (cm) 0.1 Area: (cm) 0.393 Volume: (cm) 0.039 % Reduction in Area: 28.5% % Reduction in Volume: 29.1% Epithelialization: None Tunneling: No Undermining: No Wound Description Classification: Partial Thickness Wound Margin: Flat and Intact Exudate Amount: None Present Foul Odor After Cleansing: No Wound Bed Granulation Amount: None Present (0%) Exposed Structure Necrotic Amount: Large (67-100%) Fascia Exposed: No Necrotic Quality: Eschar Fat Layer Exposed: No Tendon Exposed: No Muscle Exposed: No Joint Exposed: No Bone Exposed: No [name], Tonya R. ([id number]) Limited to Skin Breakdown Periwound Skin Texture Texture Color No Abnormalities Noted: No No Abnormalities Noted: No Callus: No Atrophie Blanche: No Crepitus: No Cyanosis: No Excoriation: No Ecchymosis: No Fluctuance: No Erythema: No Friable: No Hemosiderin Staining: No Induration: No Mottled: No Localized Edema: No Pallor: No Rash: No Rubor: No Scarring: No Temperature / Pain Moisture  instead of photographs) 0 []  - Simple Wound Measurement - one wound 0 X - Complex Wound Measurement - multiple wounds 9 5 INTERVENTIONS - Wound Dressings X - Small Wound Dressing one or multiple wounds 9 10 []  - Medium Wound Dressing one or multiple wounds 0 []  - Large Wound Dressing one or multiple wounds 0 []  - Application of Medications - topical 0 []  - Application of Medications - injection 0 INTERVENTIONS - Miscellaneous []  - External ear exam 0 [name], Tonya R. ([id number]) []  - Specimen Collection (cultures, biopsies, blood, body fluids, etc.) 0 []  - Specimen(s) / Culture(s) sent or taken to Lab for analysis 0 []  - Patient Transfer (multiple staff / Harrel Lemon Lift / Similar devices) 0 []  - Simple Staple / Suture removal (25 or less) 0 []  - Complex Staple / Suture removal (26 or more) 0 []  - Hypo / Hyperglycemic Management (close monitor of Blood Glucose) 0 []  - Ankle / Brachial Index (ABI) - do not check if billed separately 0 X - Vital Signs 1 5 Has the patient been seen at the hospital within the last three years: Yes Total Score: 265 Level Of Care: New/Established - Level 5 Electronic Signature(s) Signed: 05/14/2015 4:45:16 PM By: Regan Lemming BSN, RN Entered By: Regan Lemming on 05/14/2015 11:23:40 [name] ([id number]) -------------------------------------------------------------------------------- Encounter Discharge Information Details Patient Name: [name] Date of Service: 05/14/2015 10:45 AM Medical Record Number: [id number] Patient Account Number: [id number] Date of Birth/Sex: 11/29/1965 (49 y.o. Female) Treating RN: Baruch Gouty, RN, BSN, [place] Primary Care Physician: Arnette Norris Other Clinician: Referring Physician: Arnette Norris Treating Physician/Extender: Frann Rider in Treatment: 3 Encounter Discharge Information  Items Discharge Pain Level: 0 Discharge Condition: Stable Ambulatory Status: Wheelchair Discharge Destination: Home Transportation: Private Auto Accompanied By: husband Schedule Follow-up Appointment: No Medication Reconciliation completed and provided to Patient/Care No Jaquelinne Glendening: Provided on Clinical Summary of Care: 05/14/2015 Form Type Recipient Paper Patient [facility] Electronic Signature(s) Signed: 05/14/2015 11:33:37 AM By: Ruthine Dose Entered By: Ruthine Dose on 05/14/2015 11:33:37 [name] ([id number]) -------------------------------------------------------------------------------- Lower Extremity Assessment Details Patient Name: [name] Date of Service: 05/14/2015 10:45 AM Medical Record Number: [id number] Patient Account Number: [id number] Date of Birth/Sex: 13-Sep-1965 (49 y.o. Female) Treating RN: Afful, RN, BSN, [place] Primary Care Physician: Arnette Norris Other Clinician: Referring Physician: Arnette Norris Treating Physician/Extender: Frann Rider in Treatment: 3 Electronic Signature(s) Signed: 05/14/2015 4:45:16 PM By: Regan Lemming BSN, RN Entered By: Regan Lemming on 05/14/2015 11:01:58 [name], [place] ([id number]) -------------------------------------------------------------------------------- Multi Wound Chart Details Patient Name: [name] Date of Service: 05/14/2015 10:45 AM Medical Record Number: [id number] Patient Account Number: [id number] Date of Birth/Sex: [birth year] (49 y.o. Female) Treating RN: Baruch Gouty, RN, BSN, [place] Primary Care Physician: Arnette Norris Other Clinician: Referring Physician: Arnette Norris Treating Physician/Extender: Frann Rider in Treatment: 3 Vital Signs Height(in): 68 Pulse(bpm): 66 Weight(lbs): 220 Blood Pressure 102/38 (mmHg): Body Mass Index(BMI): 33 Temperature(F): 97.8 Respiratory Rate 17 (breaths/min): Photos: [10:No Photos] [11:No Photos] [12:No Photos] Wound Location: [10:Right  Sacrum] [11:Right Elbow] [12:Right Hand - 1st Digit] Wounding Event: [10:Pressure Injury] [11:Pressure Injury] [12:Trauma] Primary Etiology: [10:Pressure Ulcer] [11:Pressure Ulcer] [12:Trauma, Other] Comorbid History: [10:Cataracts, Anemia, Type II Diabetes, End Stage Renal Disease, History of Burn, Neuropathy] [11:Cataracts, Anemia, Type II Diabetes, End Stage Renal Disease, History of Burn, Neuropathy] [12:Cataracts, Anemia, Type II Diabetes, End  Stage Renal Disease, History of Burn, Neuropathy] Date Acquired: [10:04/01/2015] [11:12/04/2014] [12:12/04/2014] Weeks of Treatment: [10:3] [11:3] [12:3] Wound Status: [10:Open] [11:Open] [12:Open] Measurements  Temperature: No Abnormality No Abnormalities Noted: No Dry / Scaly: Yes Maceration: No Moist: No Wound Preparation Ulcer Cleansing: Rinsed/Irrigated with Saline Topical Anesthetic Applied: None Treatment Notes Wound #18 (Right Hand - 3rd Digit) 1. Cleansed with: Clean wound with Normal Saline 4. Dressing Applied: Other dressing (specify in notes) 5. Secondary Dressing Applied Bordered Foam Dressing Notes betadine paint, BFD to sacrum only Electronic Signature(s) Signed: 05/15/2015 4:52:15 PM By: Regan Lemming BSN, RN Previous Signature: 05/14/2015 4:45:16 PM Version By: Regan Lemming BSN, RN Entered By: Regan Lemming on 05/15/2015 16:52:15 [name], [name]  ([id number]) -------------------------------------------------------------------------------- Vitals Details Patient Name: [name] Date of Service: 05/14/2015 10:45 AM Medical Record Number: [id number] Patient Account Number: [id number] Date of Birth/Sex: 1966-01-01 (49 y.o. Female) Treating RN: Afful, RN, BSN, [place] Primary Care Physician: Arnette Norris Other Clinician: Referring Physician: Arnette Norris Treating Physician/Extender: Frann Rider in Treatment: 3 Vital Signs Time Taken: 10:55 Temperature (F): 97.8 Height (in): 68 Pulse (bpm): 66 Weight (lbs): 220 Respiratory Rate (breaths/min): 17 Body Mass Index (BMI): 33.4 Blood Pressure (mmHg): 102/38 Reference Range: 80 - 120 mg / dl Electronic Signature(s) Signed: 05/14/2015 4:45:16 PM By: Regan Lemming BSN, RN Entered By: Regan Lemming on 05/14/2015 11:01:50  Patient Name: [name], [name] Date of Service: 05/14/2015 10:45 AM Medical Record Number: [id number] Patient Account Number: [id number] Date of Birth/Sex: 1966/01/22 (49 y.o. Female) Treating RN: Afful, RN, BSN, [place] Primary Care Physician: Arnette Norris Other Clinician: Referring Physician: Arnette Norris Treating Physician/Extender: Frann Rider in Treatment: 3 Wound Status Wound Number: 17 Primary Trauma, Other Etiology: Wound Location: Left Hand - 4th Digit Wound Open Wounding Event: Trauma Status: Date Acquired: 12/04/2014 Comorbid Cataracts, Anemia, Type II Diabetes, Weeks Of Treatment: 3 History: End Stage Renal Disease, History of Clustered Wound: No Burn, Neuropathy Photos Wound Measurements Length: (cm) 0.7 Width: (cm) 0.8 Depth: (cm) 0.1 Area: (cm) 0.44 Volume: (cm) 0.044 % Reduction in Area: 29.9% % Reduction in Volume: 30.2% Epithelialization: None Tunneling: No Undermining: No Wound Description Classification: Partial Thickness Wound Margin: Flat and Intact Exudate Amount: None Present Foul Odor After Cleansing: No Wound Bed Granulation Amount: Large (67-100%) Exposed Structure Granulation Quality:  Red Fascia Exposed: No Necrotic Amount: None Present (0%) Fat Layer Exposed: No Tendon Exposed: No Muscle Exposed: No Joint Exposed: No Bone Exposed: No [name], Tonya R. ([id number]) Limited to Skin Breakdown Periwound Skin Texture Texture Color No Abnormalities Noted: No No Abnormalities Noted: No Callus: No Atrophie Blanche: No Crepitus: No Cyanosis: No Excoriation: No Ecchymosis: No Fluctuance: No Erythema: No Friable: No Hemosiderin Staining: No Induration: No Mottled: No Localized Edema: No Pallor: No Rash: No Rubor: No Scarring: No Temperature / Pain Moisture Temperature: No Abnormality No Abnormalities Noted: No Dry / Scaly: Yes Maceration: No Moist: No Wound Preparation Ulcer Cleansing: Rinsed/Irrigated with Saline Topical Anesthetic Applied: None Treatment Notes Wound #17 (Left Hand - 4th Digit) 1. Cleansed with: Clean wound with Normal Saline 4. Dressing Applied: Other dressing (specify in notes) 5. Secondary Dressing Applied Bordered Foam Dressing Notes betadine paint, BFD to sacrum only Electronic Signature(s) Signed: 05/15/2015 4:51:39 PM By: Regan Lemming BSN, RN Previous Signature: 05/14/2015 4:45:16 PM Version By: Regan Lemming BSN, RN Entered By: Regan Lemming on 05/15/2015 16:51:39 [name], [name] ([id number]) -------------------------------------------------------------------------------- Wound Assessment Details Patient Name: [name] Date of Service: 05/14/2015 10:45 AM Medical Record Number: [id number] Patient Account Number: [id number] Date of Birth/Sex: [birth year] (49 y.o. Female) Treating RN: Afful, RN, BSN, [place] Primary Care Physician: Arnette Norris Other Clinician: Referring Physician: Arnette Norris Treating Physician/Extender: Frann Rider in Treatment: 3 Wound Status Wound Number: 18 Primary Trauma, Other Etiology: Wound Location: Right Hand - 3rd Digit Wound Open Wounding Event: Trauma Status: Date  Acquired: 04/29/2015 Comorbid Cataracts, Anemia, Type II Diabetes, Weeks Of Treatment: 2 History: End Stage Renal Disease, History of Clustered Wound: No Burn, Neuropathy Photos Wound Measurements Length: (cm) 0.5 Width: (cm) 1 Depth: (cm) 0.1 Area: (cm) 0.393 Volume: (cm) 0.039 % Reduction in Area: 28.5% % Reduction in Volume: 29.1% Epithelialization: None Tunneling: No Undermining: No Wound Description Classification: Partial Thickness Wound Margin: Flat and Intact Exudate Amount: None Present Foul Odor After Cleansing: No Wound Bed Granulation Amount: None Present (0%) Exposed Structure Necrotic Amount: Large (67-100%) Fascia Exposed: No Necrotic Quality: Eschar Fat Layer Exposed: No Tendon Exposed: No Muscle Exposed: No Joint Exposed: No Bone Exposed: No [name], Tonya R. ([id number]) Limited to Skin Breakdown Periwound Skin Texture Texture Color No Abnormalities Noted: No No Abnormalities Noted: No Callus: No Atrophie Blanche: No Crepitus: No Cyanosis: No Excoriation: No Ecchymosis: No Fluctuance: No Erythema: No Friable: No Hemosiderin Staining: No Induration: No Mottled: No Localized Edema: No Pallor: No Rash: No Rubor: No Scarring: No Temperature / Pain Moisture  Patient Name: [name], [name] Date of Service: 05/14/2015 10:45 AM Medical Record Number: [id number] Patient Account Number: [id number] Date of Birth/Sex: 1966/01/22 (49 y.o. Female) Treating RN: Afful, RN, BSN, [place] Primary Care Physician: Arnette Norris Other Clinician: Referring Physician: Arnette Norris Treating Physician/Extender: Frann Rider in Treatment: 3 Wound Status Wound Number: 17 Primary Trauma, Other Etiology: Wound Location: Left Hand - 4th Digit Wound Open Wounding Event: Trauma Status: Date Acquired: 12/04/2014 Comorbid Cataracts, Anemia, Type II Diabetes, Weeks Of Treatment: 3 History: End Stage Renal Disease, History of Clustered Wound: No Burn, Neuropathy Photos Wound Measurements Length: (cm) 0.7 Width: (cm) 0.8 Depth: (cm) 0.1 Area: (cm) 0.44 Volume: (cm) 0.044 % Reduction in Area: 29.9% % Reduction in Volume: 30.2% Epithelialization: None Tunneling: No Undermining: No Wound Description Classification: Partial Thickness Wound Margin: Flat and Intact Exudate Amount: None Present Foul Odor After Cleansing: No Wound Bed Granulation Amount: Large (67-100%) Exposed Structure Granulation Quality:  Red Fascia Exposed: No Necrotic Amount: None Present (0%) Fat Layer Exposed: No Tendon Exposed: No Muscle Exposed: No Joint Exposed: No Bone Exposed: No [name], Tonya R. ([id number]) Limited to Skin Breakdown Periwound Skin Texture Texture Color No Abnormalities Noted: No No Abnormalities Noted: No Callus: No Atrophie Blanche: No Crepitus: No Cyanosis: No Excoriation: No Ecchymosis: No Fluctuance: No Erythema: No Friable: No Hemosiderin Staining: No Induration: No Mottled: No Localized Edema: No Pallor: No Rash: No Rubor: No Scarring: No Temperature / Pain Moisture Temperature: No Abnormality No Abnormalities Noted: No Dry / Scaly: Yes Maceration: No Moist: No Wound Preparation Ulcer Cleansing: Rinsed/Irrigated with Saline Topical Anesthetic Applied: None Treatment Notes Wound #17 (Left Hand - 4th Digit) 1. Cleansed with: Clean wound with Normal Saline 4. Dressing Applied: Other dressing (specify in notes) 5. Secondary Dressing Applied Bordered Foam Dressing Notes betadine paint, BFD to sacrum only Electronic Signature(s) Signed: 05/15/2015 4:51:39 PM By: Regan Lemming BSN, RN Previous Signature: 05/14/2015 4:45:16 PM Version By: Regan Lemming BSN, RN Entered By: Regan Lemming on 05/15/2015 16:51:39 [name], [name] ([id number]) -------------------------------------------------------------------------------- Wound Assessment Details Patient Name: [name] Date of Service: 05/14/2015 10:45 AM Medical Record Number: [id number] Patient Account Number: [id number] Date of Birth/Sex: [birth year] (49 y.o. Female) Treating RN: Afful, RN, BSN, [place] Primary Care Physician: Arnette Norris Other Clinician: Referring Physician: Arnette Norris Treating Physician/Extender: Frann Rider in Treatment: 3 Wound Status Wound Number: 18 Primary Trauma, Other Etiology: Wound Location: Right Hand - 3rd Digit Wound Open Wounding Event: Trauma Status: Date  Acquired: 04/29/2015 Comorbid Cataracts, Anemia, Type II Diabetes, Weeks Of Treatment: 2 History: End Stage Renal Disease, History of Clustered Wound: No Burn, Neuropathy Photos Wound Measurements Length: (cm) 0.5 Width: (cm) 1 Depth: (cm) 0.1 Area: (cm) 0.393 Volume: (cm) 0.039 % Reduction in Area: 28.5% % Reduction in Volume: 29.1% Epithelialization: None Tunneling: No Undermining: No Wound Description Classification: Partial Thickness Wound Margin: Flat and Intact Exudate Amount: None Present Foul Odor After Cleansing: No Wound Bed Granulation Amount: None Present (0%) Exposed Structure Necrotic Amount: Large (67-100%) Fascia Exposed: No Necrotic Quality: Eschar Fat Layer Exposed: No Tendon Exposed: No Muscle Exposed: No Joint Exposed: No Bone Exposed: No [name], Tonya R. ([id number]) Limited to Skin Breakdown Periwound Skin Texture Texture Color No Abnormalities Noted: No No Abnormalities Noted: No Callus: No Atrophie Blanche: No Crepitus: No Cyanosis: No Excoriation: No Ecchymosis: No Fluctuance: No Erythema: No Friable: No Hemosiderin Staining: No Induration: No Mottled: No Localized Edema: No Pallor: No Rash: No Rubor: No Scarring: No Temperature / Pain Moisture  Patient Name: [name], [name] Date of Service: 05/14/2015 10:45 AM Medical Record Number: [id number] Patient Account Number: [id number] Date of Birth/Sex: 1966/01/22 (49 y.o. Female) Treating RN: Afful, RN, BSN, [place] Primary Care Physician: Arnette Norris Other Clinician: Referring Physician: Arnette Norris Treating Physician/Extender: Frann Rider in Treatment: 3 Wound Status Wound Number: 17 Primary Trauma, Other Etiology: Wound Location: Left Hand - 4th Digit Wound Open Wounding Event: Trauma Status: Date Acquired: 12/04/2014 Comorbid Cataracts, Anemia, Type II Diabetes, Weeks Of Treatment: 3 History: End Stage Renal Disease, History of Clustered Wound: No Burn, Neuropathy Photos Wound Measurements Length: (cm) 0.7 Width: (cm) 0.8 Depth: (cm) 0.1 Area: (cm) 0.44 Volume: (cm) 0.044 % Reduction in Area: 29.9% % Reduction in Volume: 30.2% Epithelialization: None Tunneling: No Undermining: No Wound Description Classification: Partial Thickness Wound Margin: Flat and Intact Exudate Amount: None Present Foul Odor After Cleansing: No Wound Bed Granulation Amount: Large (67-100%) Exposed Structure Granulation Quality:  Red Fascia Exposed: No Necrotic Amount: None Present (0%) Fat Layer Exposed: No Tendon Exposed: No Muscle Exposed: No Joint Exposed: No Bone Exposed: No [name], Tonya R. ([id number]) Limited to Skin Breakdown Periwound Skin Texture Texture Color No Abnormalities Noted: No No Abnormalities Noted: No Callus: No Atrophie Blanche: No Crepitus: No Cyanosis: No Excoriation: No Ecchymosis: No Fluctuance: No Erythema: No Friable: No Hemosiderin Staining: No Induration: No Mottled: No Localized Edema: No Pallor: No Rash: No Rubor: No Scarring: No Temperature / Pain Moisture Temperature: No Abnormality No Abnormalities Noted: No Dry / Scaly: Yes Maceration: No Moist: No Wound Preparation Ulcer Cleansing: Rinsed/Irrigated with Saline Topical Anesthetic Applied: None Treatment Notes Wound #17 (Left Hand - 4th Digit) 1. Cleansed with: Clean wound with Normal Saline 4. Dressing Applied: Other dressing (specify in notes) 5. Secondary Dressing Applied Bordered Foam Dressing Notes betadine paint, BFD to sacrum only Electronic Signature(s) Signed: 05/15/2015 4:51:39 PM By: Regan Lemming BSN, RN Previous Signature: 05/14/2015 4:45:16 PM Version By: Regan Lemming BSN, RN Entered By: Regan Lemming on 05/15/2015 16:51:39 [name], [name] ([id number]) -------------------------------------------------------------------------------- Wound Assessment Details Patient Name: [name] Date of Service: 05/14/2015 10:45 AM Medical Record Number: [id number] Patient Account Number: [id number] Date of Birth/Sex: [birth year] (49 y.o. Female) Treating RN: Afful, RN, BSN, [place] Primary Care Physician: Arnette Norris Other Clinician: Referring Physician: Arnette Norris Treating Physician/Extender: Frann Rider in Treatment: 3 Wound Status Wound Number: 18 Primary Trauma, Other Etiology: Wound Location: Right Hand - 3rd Digit Wound Open Wounding Event: Trauma Status: Date  Acquired: 04/29/2015 Comorbid Cataracts, Anemia, Type II Diabetes, Weeks Of Treatment: 2 History: End Stage Renal Disease, History of Clustered Wound: No Burn, Neuropathy Photos Wound Measurements Length: (cm) 0.5 Width: (cm) 1 Depth: (cm) 0.1 Area: (cm) 0.393 Volume: (cm) 0.039 % Reduction in Area: 28.5% % Reduction in Volume: 29.1% Epithelialization: None Tunneling: No Undermining: No Wound Description Classification: Partial Thickness Wound Margin: Flat and Intact Exudate Amount: None Present Foul Odor After Cleansing: No Wound Bed Granulation Amount: None Present (0%) Exposed Structure Necrotic Amount: Large (67-100%) Fascia Exposed: No Necrotic Quality: Eschar Fat Layer Exposed: No Tendon Exposed: No Muscle Exposed: No Joint Exposed: No Bone Exposed: No [name], Tonya R. ([id number]) Limited to Skin Breakdown Periwound Skin Texture Texture Color No Abnormalities Noted: No No Abnormalities Noted: No Callus: No Atrophie Blanche: No Crepitus: No Cyanosis: No Excoriation: No Ecchymosis: No Fluctuance: No Erythema: No Friable: No Hemosiderin Staining: No Induration: No Mottled: No Localized Edema: No Pallor: No Rash: No Rubor: No Scarring: No Temperature / Pain Moisture  Wound Measurements Length: (cm) 1 Width: (cm) 1.6 Depth: (cm) 0.1 Area: (cm) 1.257 Volume: (cm) 0.126 % Reduction in Area: 48.7% % Reduction in Volume: 48.6% Epithelialization: None Tunneling: No Undermining: No Wound Description Classification: Partial Thickness Wound Margin: Flat and Intact Exudate Amount: None Present Foul Odor After Cleansing: No Wound Bed Granulation Amount: Large (67-100%) Exposed Structure Necrotic Amount: None Present (0%) Fascia Exposed: No Fat Layer Exposed: No Tendon Exposed: No Muscle Exposed: No Joint Exposed: No Bone Exposed: No [name], Tonya R. ([id number]) Limited to Skin Breakdown Periwound Skin Texture Texture Color No Abnormalities Noted: No No Abnormalities Noted: No Callus: No Atrophie Blanche: No Crepitus: No Cyanosis: No Excoriation: No Ecchymosis: No Fluctuance: No Erythema: No Friable: No Hemosiderin Staining: No Induration: No Mottled: No Localized Edema: No Pallor: No Rash: No Rubor: No Scarring: No Temperature / Pain Moisture Temperature: No Abnormality No Abnormalities Noted: No Dry / Scaly: Yes Maceration: No Moist: No Wound Preparation Ulcer Cleansing: Rinsed/Irrigated with Saline Topical Anesthetic Applied: None Treatment Notes Wound #15 (Left Hand - 2nd Digit) 1. Cleansed with: Clean wound with Normal Saline 4. Dressing Applied: Other dressing (specify in notes) 5. Secondary Dressing Applied Bordered Foam Dressing Notes betadine paint, BFD to sacrum only Electronic Signature(s) Signed: 05/15/2015 4:50:17 PM By: Regan Lemming BSN, RN Previous Signature: 05/14/2015 4:45:16 PM Version By: Regan Lemming BSN, RN Entered By: Regan Lemming on 05/15/2015 16:50:16 [name]  ([id number]) -------------------------------------------------------------------------------- Wound Assessment Details Patient Name: [name] Date of Service: 05/14/2015 10:45 AM Medical Record Number: [id number] Patient Account Number: [id number] Date of Birth/Sex: [birth year] (49 y.o. Female) Treating RN: Afful, RN, BSN, [place] Primary Care Physician: Arnette Norris Other Clinician: Referring Physician: Arnette Norris Treating Physician/Extender: Frann Rider in Treatment: 3 Wound Status Wound Number: 16 Primary Trauma, Other Etiology: Wound Location: Left Hand - 3rd Digit Wound Open Wounding Event: Trauma Status: Date Acquired: 12/04/2014 Comorbid Cataracts, Anemia, Type II Diabetes, Weeks Of Treatment: 3 History: End Stage Renal Disease, History of Clustered Wound: No Burn, Neuropathy Photos Wound Measurements Length: (cm) 0.5 Width: (cm) 0.7 Depth: (cm) 0.1 Area: (cm) 0.275 Volume: (cm) 0.027 % Reduction in Area: -29.7% % Reduction in Volume: -28.6% Epithelialization: None Tunneling: No Undermining: No Wound Description Classification: Partial Thickness Wound Margin: Flat and Intact Exudate Amount: None Present Foul Odor After Cleansing: No Wound Bed Granulation Amount: Large (67-100%) Exposed Structure Granulation Quality: Red Fascia Exposed: No Necrotic Amount: None Present (0%) Fat Layer Exposed: No Tendon Exposed: No Muscle Exposed: No Joint Exposed: No Bone Exposed: No [name], Tonya R. ([id number]) Limited to Skin Breakdown Periwound Skin Texture Texture Color No Abnormalities Noted: No No Abnormalities Noted: No Callus: No Atrophie Blanche: No Crepitus: No Cyanosis: No Excoriation: No Ecchymosis: No Fluctuance: No Erythema: No Friable: No Hemosiderin Staining: No Induration: No Mottled: No Localized Edema: No Pallor: No Rash: No Rubor: No Scarring: No Temperature / Pain Moisture Temperature: No Abnormality No  Abnormalities Noted: No Dry / Scaly: Yes Maceration: No Moist: No Wound Preparation Ulcer Cleansing: Rinsed/Irrigated with Saline Topical Anesthetic Applied: None Treatment Notes Wound #16 (Left Hand - 3rd Digit) 1. Cleansed with: Clean wound with Normal Saline 4. Dressing Applied: Other dressing (specify in notes) 5. Secondary Dressing Applied Bordered Foam Dressing Notes betadine paint, BFD to sacrum only Electronic Signature(s) Signed: 05/15/2015 4:50:53 PM By: Regan Lemming BSN, RN Previous Signature: 05/14/2015 4:45:16 PM Version By: Regan Lemming BSN, RN Entered By: Regan Lemming on 05/15/2015 16:50:53 [place] ([id number]) -------------------------------------------------------------------------------- Wound Assessment Details  BFD to sacrum only Electronic Signature(s) Signed: 05/15/2015 4:47:51 PM By: Regan Lemming BSN, RN Previous Signature: 05/14/2015 4:45:16 PM Version By: Regan Lemming BSN, RN Entered By: Regan Lemming on 05/15/2015 16:47:51 AMESHA, BAILEY ([id number]) -------------------------------------------------------------------------------- Wound Assessment Details Patient Name: [name] Date of Service: 05/14/2015 10:45 AM Medical Record Number: [id number] Patient Account Number: [id number] Date of Birth/Sex: 1965/12/11 (49 y.o. Female) Treating RN: Afful, RN, BSN, [place] Primary Care Physician: Arnette Norris Other Clinician: Referring Physician: Arnette Norris Treating Physician/Extender: Frann Rider in Treatment: 3 Wound Status Wound Number: 12 Primary Trauma, Other Etiology: Wound Location: Right Hand - 1st Digit Wound Open Wounding Event: Trauma Status: Date Acquired:  12/04/2014 Comorbid Cataracts, Anemia, Type II Diabetes, Weeks Of Treatment: 3 History: End Stage Renal Disease, History of Clustered Wound: No Burn, Neuropathy Photos Wound Measurements Length: (cm) 1 Width: (cm) 1.5 Depth: (cm) 0.1 Area: (cm) 1.178 Volume: (cm) 0.118 % Reduction in Area: 3.8% % Reduction in Volume: 4.1% Epithelialization: None Tunneling: No Undermining: No Wound Description Classification: Partial Thickness Wound Margin: Flat and Intact Exudate Amount: None Present Foul Odor After Cleansing: No Wound Bed Granulation Amount: Medium (34-66%) Exposed Structure Granulation Quality: Red Fascia Exposed: No Necrotic Amount: Medium (34-66%) Fat Layer Exposed: No Necrotic Quality: Eschar Tendon Exposed: No Muscle Exposed: No Joint Exposed: No Bone Exposed: No [name], Tonya R. ([id number]) Limited to Skin Breakdown Periwound Skin Texture Texture Color No Abnormalities Noted: No No Abnormalities Noted: No Callus: No Atrophie Blanche: No Crepitus: No Cyanosis: No Excoriation: No Ecchymosis: No Fluctuance: No Erythema: No Friable: No Hemosiderin Staining: No Induration: No Mottled: No Localized Edema: No Pallor: No Rash: No Rubor: No Scarring: No Temperature / Pain Moisture Temperature: No Abnormality No Abnormalities Noted: No Dry / Scaly: Yes Maceration: No Moist: No Wound Preparation Ulcer Cleansing: Rinsed/Irrigated with Saline Topical Anesthetic Applied: None Treatment Notes Wound #12 (Right Hand - 1st Digit) 1. Cleansed with: Clean wound with Normal Saline 4. Dressing Applied: Other dressing (specify in notes) 5. Secondary Dressing Applied Bordered Foam Dressing Notes betadine paint, BFD to sacrum only Electronic Signature(s) Signed: 05/15/2015 4:48:25 PM By: Regan Lemming BSN, RN Previous Signature: 05/14/2015 4:45:16 PM Version By: Regan Lemming BSN, RN Entered By: Regan Lemming on 05/15/2015 16:48:25 LOLLIE, GUNNER  ([id number]) -------------------------------------------------------------------------------- Wound Assessment Details Patient Name: [name] Date of Service: 05/14/2015 10:45 AM Medical Record Number: [id number] Patient Account Number: [id number] Date of Birth/Sex: 31-Jul-1966 (49 y.o. Female) Treating RN: Afful, RN, BSN, [place] Primary Care Physician: Arnette Norris Other Clinician: Referring Physician: Arnette Norris Treating Physician/Extender: Frann Rider in Treatment: 3 Wound Status Wound Number: 13 Primary Trauma, Other Etiology: Wound Location: Right Hand - 2nd Digit Wound Open Wounding Event: Trauma Status: Date Acquired: 12/04/2014 Comorbid Cataracts, Anemia, Type II Diabetes, Weeks Of Treatment: 3 History: End Stage Renal Disease, History of Clustered Wound: No Burn, Neuropathy Photos Wound Measurements Length: (cm) 0.6 Width: (cm) 0.7 Depth: (cm) 0.1 Area: (cm) 0.33 Volume: (cm) 0.033 % Reduction in Area: -179.7% % Reduction in Volume: -175% Epithelialization: Small (1-33%) Tunneling: No Undermining: No Wound Description Classification: Partial Thickness Wound Margin: Flat and Intact Exudate Amount: None Present Foul Odor After Cleansing: No Wound Bed Granulation Amount: Large (67-100%) Exposed Structure Granulation Quality: Red Fascia Exposed: No Necrotic Amount: None Present (0%) Fat Layer Exposed: No Tendon Exposed: No Muscle Exposed: No Joint Exposed: No Bone Exposed: No [name], Tonya R. ([id number]) Limited to Skin Breakdown Periwound Skin Texture Texture Color No Abnormalities Noted: No No  instead of photographs) 0 []  - Simple Wound Measurement - one wound 0 X - Complex Wound Measurement - multiple wounds 9 5 INTERVENTIONS - Wound Dressings X - Small Wound Dressing one or multiple wounds 9 10 []  - Medium Wound Dressing one or multiple wounds 0 []  - Large Wound Dressing one or multiple wounds 0 []  - Application of Medications - topical 0 []  - Application of Medications - injection 0 INTERVENTIONS - Miscellaneous []  - External ear exam 0 [name], Tonya R. ([id number]) []  - Specimen Collection (cultures, biopsies, blood, body fluids, etc.) 0 []  - Specimen(s) / Culture(s) sent or taken to Lab for analysis 0 []  - Patient Transfer (multiple staff / Harrel Lemon Lift / Similar devices) 0 []  - Simple Staple / Suture removal (25 or less) 0 []  - Complex Staple / Suture removal (26 or more) 0 []  - Hypo / Hyperglycemic Management (close monitor of Blood Glucose) 0 []  - Ankle / Brachial Index (ABI) - do not check if billed separately 0 X - Vital Signs 1 5 Has the patient been seen at the hospital within the last three years: Yes Total Score: 265 Level Of Care: New/Established - Level 5 Electronic Signature(s) Signed: 05/14/2015 4:45:16 PM By: Regan Lemming BSN, RN Entered By: Regan Lemming on 05/14/2015 11:23:40 [name] ([id number]) -------------------------------------------------------------------------------- Encounter Discharge Information Details Patient Name: [name] Date of Service: 05/14/2015 10:45 AM Medical Record Number: [id number] Patient Account Number: [id number] Date of Birth/Sex: 11/29/1965 (49 y.o. Female) Treating RN: Baruch Gouty, RN, BSN, [place] Primary Care Physician: Arnette Norris Other Clinician: Referring Physician: Arnette Norris Treating Physician/Extender: Frann Rider in Treatment: 3 Encounter Discharge Information  Items Discharge Pain Level: 0 Discharge Condition: Stable Ambulatory Status: Wheelchair Discharge Destination: Home Transportation: Private Auto Accompanied By: husband Schedule Follow-up Appointment: No Medication Reconciliation completed and provided to Patient/Care No Jaquelinne Glendening: Provided on Clinical Summary of Care: 05/14/2015 Form Type Recipient Paper Patient [facility] Electronic Signature(s) Signed: 05/14/2015 11:33:37 AM By: Ruthine Dose Entered By: Ruthine Dose on 05/14/2015 11:33:37 [name] ([id number]) -------------------------------------------------------------------------------- Lower Extremity Assessment Details Patient Name: [name] Date of Service: 05/14/2015 10:45 AM Medical Record Number: [id number] Patient Account Number: [id number] Date of Birth/Sex: 13-Sep-1965 (49 y.o. Female) Treating RN: Afful, RN, BSN, [place] Primary Care Physician: Arnette Norris Other Clinician: Referring Physician: Arnette Norris Treating Physician/Extender: Frann Rider in Treatment: 3 Electronic Signature(s) Signed: 05/14/2015 4:45:16 PM By: Regan Lemming BSN, RN Entered By: Regan Lemming on 05/14/2015 11:01:58 [name], ESCUD[place] ([id number]) -------------------------------------------------------------------------------- Multi Wound Chart Details Patient Name: [name] Date of Service: 05/14/2015 10:45 AM Medical Record Number: [id number] Patient Account Number: [id number] Date of Birth/Sex: [birth year] (49 y.o. Female) Treating RN: Baruch Gouty, RN, BSN, [place] Primary Care Physician: Arnette Norris Other Clinician: Referring Physician: Arnette Norris Treating Physician/Extender: Frann Rider in Treatment: 3 Vital Signs Height(in): 68 Pulse(bpm): 66 Weight(lbs): 220 Blood Pressure 102/38 (mmHg): Body Mass Index(BMI): 33 Temperature(F): 97.8 Respiratory Rate 17 (breaths/min): Photos: [10:No Photos] [11:No Photos] [12:No Photos] Wound Location: [10:Right  Sacrum] [11:Right Elbow] [12:Right Hand - 1st Digit] Wounding Event: [10:Pressure Injury] [11:Pressure Injury] [12:Trauma] Primary Etiology: [10:Pressure Ulcer] [11:Pressure Ulcer] [12:Trauma, Other] Comorbid History: [10:Cataracts, Anemia, Type II Diabetes, End Stage Renal Disease, History of Burn, Neuropathy] [11:Cataracts, Anemia, Type II Diabetes, End Stage Renal Disease, History of Burn, Neuropathy] [12:Cataracts, Anemia, Type II Diabetes, End  Stage Renal Disease, History of Burn, Neuropathy] Date Acquired: [10:04/01/2015] [11:12/04/2014] [12:12/04/2014] Weeks of Treatment: [10:3] [11:3] [12:3] Wound Status: [10:Open] [11:Open] [12:Open] Measurements  Wound Measurements Length: (cm) 1 Width: (cm) 1.6 Depth: (cm) 0.1 Area: (cm) 1.257 Volume: (cm) 0.126 % Reduction in Area: 48.7% % Reduction in Volume: 48.6% Epithelialization: None Tunneling: No Undermining: No Wound Description Classification: Partial Thickness Wound Margin: Flat and Intact Exudate Amount: None Present Foul Odor After Cleansing: No Wound Bed Granulation Amount: Large (67-100%) Exposed Structure Necrotic Amount: None Present (0%) Fascia Exposed: No Fat Layer Exposed: No Tendon Exposed: No Muscle Exposed: No Joint Exposed: No Bone Exposed: No [name], Tonya R. ([id number]) Limited to Skin Breakdown Periwound Skin Texture Texture Color No Abnormalities Noted: No No Abnormalities Noted: No Callus: No Atrophie Blanche: No Crepitus: No Cyanosis: No Excoriation: No Ecchymosis: No Fluctuance: No Erythema: No Friable: No Hemosiderin Staining: No Induration: No Mottled: No Localized Edema: No Pallor: No Rash: No Rubor: No Scarring: No Temperature / Pain Moisture Temperature: No Abnormality No Abnormalities Noted: No Dry / Scaly: Yes Maceration: No Moist: No Wound Preparation Ulcer Cleansing: Rinsed/Irrigated with Saline Topical Anesthetic Applied: None Treatment Notes Wound #15 (Left Hand - 2nd Digit) 1. Cleansed with: Clean wound with Normal Saline 4. Dressing Applied: Other dressing (specify in notes) 5. Secondary Dressing Applied Bordered Foam Dressing Notes betadine paint, BFD to sacrum only Electronic Signature(s) Signed: 05/15/2015 4:50:17 PM By: Regan Lemming BSN, RN Previous Signature: 05/14/2015 4:45:16 PM Version By: Regan Lemming BSN, RN Entered By: Regan Lemming on 05/15/2015 16:50:16 [name]  ([id number]) -------------------------------------------------------------------------------- Wound Assessment Details Patient Name: [name] Date of Service: 05/14/2015 10:45 AM Medical Record Number: [id number] Patient Account Number: [id number] Date of Birth/Sex: [birth year] (49 y.o. Female) Treating RN: Afful, RN, BSN, [place] Primary Care Physician: Arnette Norris Other Clinician: Referring Physician: Arnette Norris Treating Physician/Extender: Frann Rider in Treatment: 3 Wound Status Wound Number: 16 Primary Trauma, Other Etiology: Wound Location: Left Hand - 3rd Digit Wound Open Wounding Event: Trauma Status: Date Acquired: 12/04/2014 Comorbid Cataracts, Anemia, Type II Diabetes, Weeks Of Treatment: 3 History: End Stage Renal Disease, History of Clustered Wound: No Burn, Neuropathy Photos Wound Measurements Length: (cm) 0.5 Width: (cm) 0.7 Depth: (cm) 0.1 Area: (cm) 0.275 Volume: (cm) 0.027 % Reduction in Area: -29.7% % Reduction in Volume: -28.6% Epithelialization: None Tunneling: No Undermining: No Wound Description Classification: Partial Thickness Wound Margin: Flat and Intact Exudate Amount: None Present Foul Odor After Cleansing: No Wound Bed Granulation Amount: Large (67-100%) Exposed Structure Granulation Quality: Red Fascia Exposed: No Necrotic Amount: None Present (0%) Fat Layer Exposed: No Tendon Exposed: No Muscle Exposed: No Joint Exposed: No Bone Exposed: No [name], Tonya R. ([id number]) Limited to Skin Breakdown Periwound Skin Texture Texture Color No Abnormalities Noted: No No Abnormalities Noted: No Callus: No Atrophie Blanche: No Crepitus: No Cyanosis: No Excoriation: No Ecchymosis: No Fluctuance: No Erythema: No Friable: No Hemosiderin Staining: No Induration: No Mottled: No Localized Edema: No Pallor: No Rash: No Rubor: No Scarring: No Temperature / Pain Moisture Temperature: No Abnormality No  Abnormalities Noted: No Dry / Scaly: Yes Maceration: No Moist: No Wound Preparation Ulcer Cleansing: Rinsed/Irrigated with Saline Topical Anesthetic Applied: None Treatment Notes Wound #16 (Left Hand - 3rd Digit) 1. Cleansed with: Clean wound with Normal Saline 4. Dressing Applied: Other dressing (specify in notes) 5. Secondary Dressing Applied Bordered Foam Dressing Notes betadine paint, BFD to sacrum only Electronic Signature(s) Signed: 05/15/2015 4:50:53 PM By: Regan Lemming BSN, RN Previous Signature: 05/14/2015 4:45:16 PM Version By: Regan Lemming BSN, RN Entered By: Regan Lemming on 05/15/2015 16:50:53 [place] ([id number]) -------------------------------------------------------------------------------- Wound Assessment Details

## 2015-05-15 NOTE — Telephone Encounter (Signed)
I CALLED [facility] AND SPOKE WITH KENDRA AND SHE IS FAXING OVER THE RESULTS. Kaori Jumper

## 2015-05-21 ENCOUNTER — Encounter: Payer: Self-pay | Admitting: Podiatry

## 2015-05-21 ENCOUNTER — Ambulatory Visit: Payer: Medicare Other

## 2015-05-21 ENCOUNTER — Ambulatory Visit (INDEPENDENT_AMBULATORY_CARE_PROVIDER_SITE_OTHER): Payer: Medicare Other | Admitting: Podiatry

## 2015-05-21 DIAGNOSIS — R52 Pain, unspecified: Secondary | ICD-10-CM

## 2015-05-21 DIAGNOSIS — L97512 Non-pressure chronic ulcer of other part of right foot with fat layer exposed: Secondary | ICD-10-CM

## 2015-05-21 DIAGNOSIS — L03031 Cellulitis of right toe: Secondary | ICD-10-CM

## 2015-05-21 DIAGNOSIS — L02611 Cutaneous abscess of right foot: Secondary | ICD-10-CM

## 2015-05-21 NOTE — Progress Notes (Signed)
Patient ID: [name], female   DOB: 06/14/1966, 49 y.o.   MRN: [id number]  Subjective:  49 year old female presents the office today for follow-up evaluation of right third toe ulceration. She states that she is continuing on antibiotics. She currently denies any drainage or any pus coming from the wound. She denies any redness or swelling to the toe. She has not yet followed up in the wound care center for other wounds. She currently denies any systemic complaints as fevers, chills, nausea, vomiting. Denies any calf pain, chest pain, shortness of breath. No other complaints at this time in no acute changes otherwise.  Objective: AAO 3, NAD  DP/PT pulses decreased bilaterally Protective sensation decreased with Simms Weinstein monofilament The dorsal aspect of the right third digit on the PIPJ is an annular hyperkeratotic lesion with underlying ulceration. After debridement the wound measures 0.3 x 0.2 cm. There is no probe to bone, undermining, tunneling. There is no swelling erythema, ascending cellulitis, fluctuance, crepitus, malodor, drainage/purulence at this time. There are no open lesions or pre-ulcerative lesions to the lower extremities. There is no pain with calf compression, swelling, warmth, erythema. Hammertoe contractures of lesser digits. Previous imitations of the right first and second digits as well as a left second and third toes.  Assessment: 49 year old female with resolving infection right third toe  Plan: -Treatment options discussed including all alternatives, risks, and complications -X-rays were obtained and reviewed with the patient. There question will cortical changes on the dorsal aspect of the proximal phalanx of the PIPJ concerning for possible osteomyelitis however the x-rays appear to be grossly unchanged compared to prior x-rays. -Culture results were obtained from last appointment which revealed yeast from the toe. Upon talking the patient off that she  states that she's had multiple problems with yeast infections previously. She has a sacral wound which apparently grew yeast as well. She also states that she has had other wounds which consistently grew yeast. She's never seen an infectious disease specialist that she's never been treated for this for which she knows. Because of this I will refer her to the infectious disease specialist. She has multiple wounds over her body. She is describing treated the [place] wound care center. -Monitor for any clinical signs or symptoms of infection and directed to call the office immediately should any occur or go to the ER. -Follow-up in 2 weeks or sooner if any problems arise. In the meantime, encouraged to call the office with any questions, concerns, change in symptoms.   Celesta Gentile, DPM

## 2015-05-22 ENCOUNTER — Telehealth: Payer: Self-pay | Admitting: *Deleted

## 2015-05-22 DIAGNOSIS — L97512 Non-pressure chronic ulcer of other part of right foot with fat layer exposed: Secondary | ICD-10-CM

## 2015-05-22 NOTE — Telephone Encounter (Signed)
Informed pt Dr. Jacqualyn Posey had referred to Infectious Disease at [facility] in [place].  Pt states that is fine, and I called pt's cellphone and left the [phone] to call for scheduling.  Faxed required Infectious Disease form, pt data and insurance, and chart notes to [phone].

## 2015-05-28 ENCOUNTER — Encounter: Payer: Medicare Other | Admitting: Surgery

## 2015-05-28 DIAGNOSIS — L89310 Pressure ulcer of right buttock, unstageable: Secondary | ICD-10-CM | POA: Diagnosis not present

## 2015-05-29 NOTE — Progress Notes (Signed)
[name], [name] ([id number]) Visit Report for 05/28/2015 Chief Complaint Document Details Patient Name: [name], [name] Date of Service: 05/28/2015 11:30 AM Medical Record Number: [id number] Patient Account Number: [id number] Date of Birth/Sex: July 19, 1966 (49 y.o. Female) Treating RN: Baruch Gouty, RN, BSN, Velva Harman Primary Care Physician: Arnette Norris Other Clinician: Referring Physician: Arnette Norris Treating Physician/Extender: Frann Rider in Treatment: 5 Information Obtained from: Patient Chief Complaint Patient presents to the wound care center for a consult due non healing wound The patient has one area on her right gluteal region and several fingers involved with ulcerations. This has been there for about 3 months. Electronic Signature(s) Signed: 05/28/2015 11:52:39 AM By: [name] MD, FACS Entered By: [name] on 05/28/2015 11:52:39 [name], [name] ([id number]) -------------------------------------------------------------------------------- HPI Details Patient Name: [name] Date of Service: 05/28/2015 11:30 AM Medical Record Number: [id number] Patient Account Number: [id number] Date of Birth/Sex: 1966/01/03 (49 y.o. Female) Treating RN: Baruch Gouty, RN, BSN, Velva Harman Primary Care Physician: Arnette Norris Other Clinician: Referring Physician: Arnette Norris Treating Physician/Extender: Frann Rider in Treatment: 5 History of Present Illness Location: right gluteal area and 3 fingers on her right hand and 4 fingers on her left hand involved with ulceration. Quality: Patient reports No Pain. Severity: Patient states wound are getting worse. Duration: Patient has had the wound for > 3 months prior to seeking treatment at the wound center Context: The wound appeared gradually over time Modifying Factors: Patient is currently on renal dialysis and receives treatments 3 times weekly HPI Description: Pleasant 49 year old patient who is known to the wound clinic  from previous visits. She has significant diabetic neuropathy and has no sensation in her hands and feet. She is wheelchair-bound and takes dialysis on a regular basis 3 times a week. 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 Past medical history significant for diabetes mellitus,end-stage renal disease on hemodialysis, coronary artery disease, multiple amputations, sleep apnea, wheelchair bound due to severe peripheral neuropathy. She has now declined quite significantly and is wheelchair-bound all the time. She has developed decubitus ulcers of the skin and has been referred to [place] by her PCP. She was also recently seen by Dr. Celesta Gentile for her feet and he noticed that she had a ulceration of the right third toe and put her on Levaquin and local care. She says she is laying down in bed much more now and has been trying to offload as much as possible. Due to a psychiatric issue she has been biting her fingernails and has caused much ulceration to both hands. Part of it may be related to the medication she was on. 05/14/2015 -- she says she is very sore around her anus and the vagina and has a lot of redness and problems there. I have reviewed her x-rays which were done of both hands and they show: X-ray of the left hand shows no acute fracture, dislocation or convincing osteomyelitis. X-ray of the right hand shows deformities of the distal phalanx of the first second and third digits with peripheral vascular disease but no evidence of acute fracture dislocation or osteomyelitis. 05/28/2015 -- she has seen the dermatologist and will also be seeing the infectious disease doctor soon and is on a proper regimen for her multiple ailments. Electronic Signature(s) Signed: 05/28/2015 11:53:10 AM By: [name] MD, FACS Entered By: [name] on 05/28/2015  11:53:10 [name]  [name] ([id number]) -------------------------------------------------------------------------------- Physical Exam Details Patient Name: [name], [name] Date of Service: 05/28/2015 11:30 AM Medical Record Number: [id number] Patient Account Number: [id number] Date of Birth/Sex: [birth year] (49 y.o. Female) Treating RN: Baruch Gouty, RN, BSN, Velva Harman Primary Care Physician: Arnette Norris Other Clinician: Referring Physician: Arnette Norris Treating Physician/Extender: Frann Rider in Treatment: 5 Constitutional . Pulse regular. Respirations normal and unlabored. Afebrile. . Eyes Nonicteric. Reactive to light. Ears, Nose, Mouth, and Throat Lips, teeth, and gums WNL.Marland Kitchen Moist mucosa without lesions . Neck supple and nontender. No palpable supraclavicular or cervical adenopathy. Normal sized without goiter. Respiratory WNL. No retractions.. Cardiovascular Pedal Pulses WNL. No clubbing, cyanosis or edema. Chest Breasts symmetical and no nipple discharge.. Breast tissue WNL, no masses, lumps, or tenderness.. Lymphatic No adneopathy. No adenopathy. No adenopathy. Musculoskeletal Adexa without tenderness or enlargement.. Digits and nails w/o clubbing, cyanosis, infection, petechiae, ischemia, or inflammatory conditions.. Integumentary (Hair, Skin) No suspicious lesions. No crepitus or fluctuance. No peri-wound warmth or erythema. No masses.Marland Kitchen Psychiatric Judgement and insight Intact.. No evidence of depression, anxiety, or agitation.. Notes She continues to pick on her fingertips and some of the areas again down to granulation tissue and the eschar has fallen off. Her right gluteal region continues to be quite tender and though there is partial separation of the eschar the remaining cannot be removed because of the tenderness. Electronic Signature(s) Signed: 05/28/2015 11:54:07 AM By: [name] MD, FACS Entered By: [name] on 05/28/2015 11:54:06 [name], [name] ([id number]) -------------------------------------------------------------------------------- Physician Orders Details Patient Name: [name] Date of Service: 05/28/2015 11:30 AM Medical Record Number: [id number] Patient Account Number: [id number] Date of Birth/Sex: 01/06/1966 (49 y.o. Female) Treating RN: Montey Hora Primary Care Physician: Arnette Norris Other Clinician: Referring Physician: Arnette Norris Treating Physician/Extender: Frann Rider in Treatment: 5 Verbal / Phone Orders: Yes Clinician: Montey Hora Read Back and Verified: Yes Diagnosis Coding Wound Cleansing Wound #10 Right Sacrum o Clean wound with Normal Saline. Wound #12 Right Hand - 1st Digit o Clean wound with Normal Saline. Wound #13 Right Hand - 2nd Digit o Clean wound with Normal Saline. Wound #14 Left Hand - 1st Digit o Clean wound with Normal Saline. Wound #15 Left Hand - 2nd Digit o Clean wound with Normal Saline. Wound #16 Left Hand - 3rd Digit o Clean wound with Normal Saline. Wound #17 Left Hand - 4th Digit o Clean wound with Normal Saline. Wound #18 Right Hand - 3rd Digit o Clean wound with Normal Saline. Primary Wound Dressing Wound #10 Right Sacrum o Other: - betadine Wound #12 Right Hand - 1st Digit o Other: - betadine Wound #13 Right Hand - 2nd Digit o Other: - betadine Wound #14 Left Hand - 1st Digit o Other: - betadine Johannsen, Annalyse R. ([id number]) Wound #15 Left Hand - 2nd Digit o Other: - betadine Wound #16 Left Hand - 3rd Digit o Other: - betadine Wound #17 Left Hand - 4th Digit o Other: - betadine Wound #18 Right Hand - 3rd Digit o Other: - betadine Secondary Dressing Wound #10 Right Sacrum o Boardered Foam Dressing Dressing Change Frequency Wound #10 Right Sacrum o Change dressing every day. Wound #12 Right Hand - 1st Digit o Change dressing every day. Wound #13 Right Hand - 2nd Digit o Change  dressing every day. Wound #14 Left Hand - 1st Digit o Change dressing every day. Wound #15 Left Hand - 2nd Digit o Change dressing every day. Wound #16 Left Hand - 3rd Digit o Change  dressing every day. Wound #17 Left Hand - 4th Digit o Change dressing every day. Wound #18 Right Hand - 3rd Digit o Change dressing every day. Follow-up Appointments Wound #10 Right Sacrum o Return Appointment in 1 week. Wound #12 Right Hand - 1st Digit o Return Appointment in 1 week. [name], [name] ([id number]) Wound #13 Right Hand - 2nd Digit o Return Appointment in 1 week. Wound #14 Left Hand - 1st Digit o Return Appointment in 1 week. Wound #15 Left Hand - 2nd Digit o Return Appointment in 1 week. Wound #16 Left Hand - 3rd Digit o Return Appointment in 1 week. Wound #17 Left Hand - 4th Digit o Return Appointment in 1 week. Wound #18 Right Hand - 3rd Digit o Return Appointment in 1 week. Off-Loading Wound #10 Right Sacrum o Turn and reposition every 2 hours o Mattress - appropriate support surface for mattress Electronic Signature(s) Signed: 05/28/2015 3:53:46 PM By: [name] MD, FACS Signed: 05/28/2015 4:21:27 PM By: Montey Hora Entered By: Montey Hora on 05/28/2015 11:49:13 ROSHNI, BURBANO ([id number]) -------------------------------------------------------------------------------- Problem List Details Patient Name: [name] Date of Service: 05/28/2015 11:30 AM Medical Record Number: [id number] Patient Account Number: [id number] Date of Birth/Sex: 08-04-1966 (49 y.o. Female) Treating RN: Afful, RN, BSN, Faith Primary Care Physician: Arnette Norris Other Clinician: Referring Physician: Arnette Norris Treating Physician/Extender: Frann Rider in Treatment: 5 Active Problems ICD-10 Encounter Code Description Active Date Diagnosis E11.40 Type 2 diabetes mellitus with diabetic neuropathy, 04/23/2015 Yes unspecified Z99.2  Dependence on renal dialysis 04/23/2015 Yes L89.310 Pressure ulcer of right buttock, unstageable 04/23/2015 Yes S61.300A Unspecified open wound of right index finger with damage 04/23/2015 Yes to nail, initial encounter S61.301A Unspecified open wound of left index finger with damage 04/23/2015 Yes to nail, initial encounter S61.307A Unspecified open wound of left little finger with damage to 04/23/2015 Yes nail, initial encounter S61.303A Unspecified open wound of left middle finger with damage 04/23/2015 Yes to nail, initial encounter Inactive Problems Resolved Problems Electronic Signature(s) Signed: 05/28/2015 11:52:28 AM By: [name] MD, FACS Entered By: [name] on 05/28/2015 11:52:28 AVIYA, JARVIE ([id number] [name] ([id number]) -------------------------------------------------------------------------------- Progress Note Details Patient Name: [name] Date of Service: 05/28/2015 11:30 AM Medical Record Number: [id number] Patient Account Number: [id number] Date of Birth/Sex: 04-Jan-1966 (49 y.o. Female) Treating RN: Baruch Gouty, RN, BSN, Velva Harman Primary Care Physician: Arnette Norris Other Clinician: Referring Physician: Arnette Norris Treating Physician/Extender: Frann Rider in Treatment: 5 Subjective Chief Complaint Information obtained from Patient Patient presents to the wound care center for a consult due non healing wound The patient has one area on her right gluteal region and several fingers involved with ulcerations. This has been there for about 3 months. History of Present Illness (HPI) The following HPI elements were documented for the patient's wound: Location: right gluteal area and 3 fingers on her right hand and 4 fingers on her left hand involved with ulceration. Quality: Patient reports No Pain. Severity: Patient states wound are getting worse. Duration: Patient has had the wound for > 3 months prior to seeking treatment at the  wound center Context: The wound appeared gradually over time Modifying Factors: Patient is currently on renal dialysis and receives treatments 3 times weekly Pleasant 49 year old patient who is known to the wound clinic from previous visits. She has significant diabetic neuropathy and has no sensation in her hands and feet. She is wheelchair-bound and takes dialysis on a regular basis 3 times a week. Has been  previously treated for multiple injuries to both hands and was here at the wound center for an osteomyelitis of her right hand and took hyperbaric oxygen therapy. Past medical history significant for diabetes mellitus,end-stage renal disease on hemodialysis, coronary artery disease, multiple amputations, sleep apnea, wheelchair bound due to severe peripheral neuropathy. She has now declined quite significantly and is wheelchair-bound all the time. She has developed decubitus ulcers of the skin and has been referred to [place] by her PCP. She was also recently seen by Dr. Celesta Gentile for her feet and he noticed that she had a ulceration of the right third toe and put her on Levaquin and local care. She says she is laying down in bed much more now and has been trying to offload as much as possible. Due to a psychiatric issue she has been biting her fingernails and has caused much ulceration to both hands. Part of it may be related to the medication she was on. 05/14/2015 -- she says she is very sore around her anus and the vagina and has a lot of redness and problems there. I have reviewed her x-rays which were done of both hands and they show: X-ray of the left hand shows no acute fracture, dislocation or convincing osteomyelitis. X-ray of the right hand shows deformities of the distal phalanx of the first second and third digits with peripheral vascular disease but no evidence of acute fracture dislocation or osteomyelitis. [name], [name] ([id number]) 05/28/2015 -- she has seen the  dermatologist and will also be seeing the infectious disease doctor soon and is on a proper regimen for her multiple ailments. Objective Constitutional Pulse regular. Respirations normal and unlabored. Afebrile. Vitals Time Taken: 11:32 AM, Height: 68 in, Weight: 220 lbs, BMI: 33.4, Temperature: 98.2 F, Pulse: 74 bpm, Respiratory Rate: 16 breaths/min, Blood Pressure: 124/59 mmHg. Eyes Nonicteric. Reactive to light. Ears, Nose, Mouth, and Throat Lips, teeth, and gums WNL.Marland Kitchen Moist mucosa without lesions . Neck supple and nontender. No palpable supraclavicular or cervical adenopathy. Normal sized without goiter. Respiratory WNL. No retractions.. Cardiovascular Pedal Pulses WNL. No clubbing, cyanosis or edema. Chest Breasts symmetical and no nipple discharge.. Breast tissue WNL, no masses, lumps, or tenderness.. Lymphatic No adneopathy. No adenopathy. No adenopathy. Musculoskeletal Adexa without tenderness or enlargement.. Digits and nails w/o clubbing, cyanosis, infection, petechiae, ischemia, or inflammatory conditions.Marland Kitchen Psychiatric Judgement and insight Intact.. No evidence of depression, anxiety, or agitation.. General Notes: She continues to pick on her fingertips and some of the areas again down to granulation tissue and the eschar has fallen off. Her right gluteal region continues to be quite tender and though there is partial separation of the eschar the remaining cannot be removed because of the tenderness. [name], Tonya R. ([id number]) Integumentary (Hair, Skin) No suspicious lesions. No crepitus or fluctuance. No peri-wound warmth or erythema. No masses.. Wound #10 status is Open. Original cause of wound was Pressure Injury. The wound is located on the Right Sacrum. The wound measures 1cm length x 3cm width x 0.1cm depth; 2.356cm^2 area and 0.236cm^3 volume. Wound #11 status is Healed - Epithelialized. Original cause of wound was Pressure Injury. The wound is located on  the Right Elbow. The wound measures 0cm length x 0cm width x 0cm depth; 0cm^2 area and 0cm^3 volume. Wound #12 status is Open. Original cause of wound was Trauma. The wound is located on the Right Hand - 1st Digit. The wound measures 1cm length x 1.3cm width x 0.1cm depth; 1.021cm^2 area and 0.102cm^3 volume.  The wound is limited to skin breakdown. There is no tunneling or undermining noted. There is a none present amount of drainage noted. The wound margin is flat and intact. There is medium (34-66%) red granulation within the wound bed. There is a medium (34-66%) amount of necrotic tissue within the wound bed including Eschar. The periwound skin appearance exhibited: Dry/Scaly. The periwound skin appearance did not exhibit: Callus, Crepitus, Excoriation, Fluctuance, Friable, Induration, Localized Edema, Rash, Scarring, Maceration, Moist, Atrophie Blanche, Cyanosis, Ecchymosis, Hemosiderin Staining, Mottled, Pallor, Rubor, Erythema. Periwound temperature was noted as No Abnormality. Wound #13 status is Open. Original cause of wound was Trauma. The wound is located on the Right Hand - 2nd Digit. The wound measures 0.5cm length x 0.5cm width x 0.1cm depth; 0.196cm^2 area and 0.02cm^3 volume. The wound is limited to skin breakdown. There is no tunneling or undermining noted. There is a none present amount of drainage noted. The wound margin is flat and intact. There is no granulation within the wound bed. There is a large (67-100%) amount of necrotic tissue within the wound bed. The periwound skin appearance exhibited: Dry/Scaly. The periwound skin appearance did not exhibit: Callus, Crepitus, Excoriation, Fluctuance, Friable, Induration, Localized Edema, Rash, Scarring, Maceration, Moist, Atrophie Blanche, Cyanosis, Ecchymosis, Hemosiderin Staining, Mottled, Pallor, Rubor, Erythema. Periwound temperature was noted as No Abnormality. Wound #14 status is Open. Original cause of wound was Trauma. The  wound is located on the Left Hand - 1st Digit. The wound measures 1cm length x 1cm width x 0.1cm depth; 0.785cm^2 area and 0.079cm^3 volume. The wound is limited to skin breakdown. There is no tunneling or undermining noted. There is a none present amount of drainage noted. The wound margin is flat and intact. There is small (1-33%) granulation within the wound bed. There is a large (67-100%) amount of necrotic tissue within the wound bed including Eschar. The periwound skin appearance exhibited: Dry/Scaly. The periwound skin appearance did not exhibit: Callus, Crepitus, Excoriation, Fluctuance, Friable, Induration, Localized Edema, Rash, Scarring, Maceration, Moist, Atrophie Blanche, Cyanosis, Ecchymosis, Hemosiderin Staining, Mottled, Pallor, Rubor, Erythema. Periwound temperature was noted as No Abnormality. Wound #15 status is Open. Original cause of wound was Trauma. The wound is located on the Left Hand - 2nd Digit. The wound measures 1cm length x 1.4cm width x 0.1cm depth; 1.1cm^2 area and 0.11cm^3 volume. Wound #16 status is Open. Original cause of wound was Trauma. The wound is located on the Left Hand - 3rd Digit. The wound measures 1.3cm length x 0.8cm width x 0.2cm depth; 0.817cm^2 area and 0.163cm^3 volume. Wound #17 status is Open. Original cause of wound was Trauma. The wound is located on the Left Hand - 4th Digit. The wound measures 0.8cm length x 1cm width x 0.1cm depth; 0.628cm^2 area and 0.063cm^3 [name], Tonya R. ([id number]) volume. Wound #18 status is Open. Original cause of wound was Trauma. The wound is located on the Right Hand - 3rd Digit. The wound measures 0.6cm length x 1cm width x 0.1cm depth; 0.471cm^2 area and 0.047cm^3 volume. Assessment Active Problems ICD-10 E11.40 - Type 2 diabetes mellitus with diabetic neuropathy, unspecified Z99.2 - Dependence on renal dialysis L89.310 - Pressure ulcer of right buttock, unstageable S61.300A - Unspecified open  wound of right index finger with damage to nail, initial encounter S61.301A - Unspecified open wound of left index finger with damage to nail, initial encounter S61.307A - Unspecified open wound of left little finger with damage to nail, initial encounter S61.303A - Unspecified open wound of left  middle finger with damage to nail, initial encounter Plan Wound Cleansing: Wound #10 Right Sacrum: Clean wound with Normal Saline. Wound #12 Right Hand - 1st Digit: Clean wound with Normal Saline. Wound #13 Right Hand - 2nd Digit: Clean wound with Normal Saline. Wound #14 Left Hand - 1st Digit: Clean wound with Normal Saline. Wound #15 Left Hand - 2nd Digit: Clean wound with Normal Saline. Wound #16 Left Hand - 3rd Digit: Clean wound with Normal Saline. Wound #17 Left Hand - 4th Digit: Clean wound with Normal Saline. Wound #18 Right Hand - 3rd Digit: Clean wound with Normal Saline. Primary Wound Dressing: Wound #10 Right Sacrum: Other: - betadine Wound #12 Right Hand - 1st Digit: [name], [name]. ([id number]) Other: - betadine Wound #13 Right Hand - 2nd Digit: Other: - betadine Wound #14 Left Hand - 1st Digit: Other: - betadine Wound #15 Left Hand - 2nd Digit: Other: - betadine Wound #16 Left Hand - 3rd Digit: Other: - betadine Wound #17 Left Hand - 4th Digit: Other: - betadine Wound #18 Right Hand - 3rd Digit: Other: - betadine Secondary Dressing: Wound #10 Right Sacrum: Boardered Foam Dressing Dressing Change Frequency: Wound #10 Right Sacrum: Change dressing every day. Wound #12 Right Hand - 1st Digit: Change dressing every day. Wound #13 Right Hand - 2nd Digit: Change dressing every day. Wound #14 Left Hand - 1st Digit: Change dressing every day. Wound #15 Left Hand - 2nd Digit: Change dressing every day. Wound #16 Left Hand - 3rd Digit: Change dressing every day. Wound #17 Left Hand - 4th Digit: Change dressing every day. Wound #18 Right Hand - 3rd  Digit: Change dressing every day. Follow-up Appointments: Wound #10 Right Sacrum: Return Appointment in 1 week. Wound #12 Right Hand - 1st Digit: Return Appointment in 1 week. Wound #13 Right Hand - 2nd Digit: Return Appointment in 1 week. Wound #14 Left Hand - 1st Digit: Return Appointment in 1 week. Wound #15 Left Hand - 2nd Digit: Return Appointment in 1 week. Wound #16 Left Hand - 3rd Digit: Return Appointment in 1 week. Wound #17 Left Hand - 4th Digit: Return Appointment in 1 week. Wound #18 Right Hand - 3rd Digit: Return Appointment in 1 week. Off-Loading: AMANA, BOUSKA ([id number]) Wound #10 Right Sacrum: Turn and reposition every 2 hours Mattress - appropriate support surface for mattress I have recommended painting the fingertips with Betadine and also the dry eschar on her left gluteal region. Some of the wounds have healed especially on the elbow. She will continue following the treatment of ID and dermatology and will see [place] back next week. Electronic Signature(s) Signed: 05/28/2015 11:54:59 AM By: [name] MD, FACS Previous Signature: 05/28/2015 11:54:48 AM Version By: [name] MD, FACS Entered By: [name] on 05/28/2015 11:54:59 LOUCINDA, CROY ([id number]) -------------------------------------------------------------------------------- SuperBill Details Patient Name: [name] Date of Service: 05/28/2015 Medical Record Number: [id number] Patient Account Number: [id number] Date of Birth/Sex: [birth year] (49 y.o. Female) Treating RN: Afful, RN, BSN, [place] Primary Care Physician: Arnette Norris Other Clinician: Referring Physician: Arnette Norris Treating Physician/Extender: Frann Rider in Treatment: 5 Diagnosis Coding ICD-10 Codes Code Description E11.40 Type 2 diabetes mellitus with diabetic neuropathy, unspecified Z99.2 Dependence on renal dialysis L89.310 Pressure ulcer of right buttock, unstageable S61.300A Unspecified  open wound of right index finger with damage to nail, initial encounter S61.301A Unspecified open wound of left index finger with damage to nail, initial encounter S61.307A Unspecified open wound of left little finger with damage to  nail, initial encounter S61.303A Unspecified open wound of left middle finger with damage to nail, initial encounter Facility Procedures CPT4 Code: [id number] Description: [phone] - WOUND CARE VISIT-LEV 5 EST PT Modifier: Quantity: 1 Physician Procedures CPT4: Description Modifier Quantity Code [id number] - WC PHYS LEVEL 3 - EST PT 1 ICD-10 Description Diagnosis E11.40 Type 2 diabetes mellitus with diabetic neuropathy, unspecified L89.310 Pressure ulcer of right buttock, unstageable S61.300A  Unspecified open wound of right index finger with damage to nail, initial encounter S61.301A Unspecified open wound of left index finger with damage to nail, initial encounter Electronic Signature(s) Signed: 05/28/2015 11:55:17 AM By: [name] MD, FACS Entered By: [name] on 05/28/2015 11:55:16

## 2015-05-30 NOTE — Progress Notes (Signed)
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 Undermining: No Wound Description Classification: Partial Thickness Wound Margin: Flat and Intact Exudate Amount: None Present Foul Odor After Cleansing: No Wound Bed Granulation Amount: None Present (0%) Exposed Structure Necrotic Amount: Large (67-100%) Fascia Exposed: No Fat Layer Exposed: No Tendon Exposed: No Muscle Exposed: No Joint Exposed: No [name], Tonya R. ([id number]) Bone Exposed: No Limited to Skin Breakdown Periwound Skin Texture Texture Color No Abnormalities Noted: No No Abnormalities Noted: No Callus: No Atrophie Blanche: No Crepitus: No Cyanosis: No Excoriation: No Ecchymosis:  No Fluctuance: No Erythema: No Friable: No Hemosiderin Staining: No Induration: No Mottled: No Localized Edema: No Pallor: No Rash: No Rubor: No Scarring: No Temperature / Pain Moisture Temperature: No Abnormality No Abnormalities Noted: No Dry / Scaly: Yes Maceration: No Moist: No Wound Preparation Ulcer Cleansing: Rinsed/Irrigated with Saline Topical Anesthetic Applied: None Treatment Notes Wound #13 (Right Hand - 2nd Digit) 1. Cleansed with: Clean wound with Normal Saline 4. Dressing Applied: Other dressing (specify in notes) 5. Secondary Dressing Applied Bordered Foam Dressing Notes betadine paint, BFD to sacrum only Electronic Signature(s) Signed: 05/29/2015 4:41:51 PM By: Regan Lemming BSN, RN Entered By: Regan Lemming on 05/28/2015 11:40:16 [name] ([id number]) -------------------------------------------------------------------------------- Wound Assessment Details Patient Name: [name] Date of Service: 05/28/2015 11:30 AM Medical Record Number: [id number] Patient Account Number: [id number] Date of Birth/Sex: 14-Aug-1966 (49 y.o. Female) Treating RN: Baruch Gouty, RN, BSN, Velva Harman Primary Care Physician: Arnette Norris Other Clinician: Referring Physician: Arnette Norris Treating Physician/Extender: Frann Rider in Treatment: 5 Wound Status Wound  Number: 14 Primary Trauma, Other Etiology: Wound Location: Left Hand - 1st Digit Wound Open Wounding Event: Trauma Status: Date Acquired: 12/04/2014 Comorbid Cataracts, Anemia, Type II Diabetes, Weeks Of Treatment: 5 History: End Stage Renal Disease, History of Clustered Wound: No Burn, Neuropathy Photos Photo Uploaded By: Regan Lemming on 05/28/2015 13:24:05 Wound Measurements Length: (cm) 1 Width: (cm) 1 Depth: (cm) 0.1 Area: (cm) 0.785 Volume: (cm) 0.079 % Reduction in Area: -85.1% % Reduction in Volume: -88.1% Epithelialization: None Tunneling: No Undermining: No Wound  Description Classification: Partial Thickness Wound Margin: Flat and Intact Exudate Amount: None Present Foul Odor After Cleansing: No Wound Bed Granulation Amount: Small (1-33%) Exposed Structure Necrotic Amount: Large (67-100%) Fascia Exposed: No Necrotic Quality: Eschar Fat Layer Exposed: No Tendon Exposed: No Muscle Exposed: No Joint Exposed: No [name], Tonya R. ([id number]) Bone Exposed: No Limited to Skin Breakdown Periwound Skin Texture Texture Color No Abnormalities Noted: No No Abnormalities Noted: No Callus: No Atrophie Blanche: No Crepitus: No Cyanosis: No Excoriation: No Ecchymosis: No Fluctuance: No Erythema: No Friable: No Hemosiderin Staining: No Induration: No Mottled: No Localized Edema: No Pallor: No Rash: No Rubor: No Scarring: No Temperature / Pain Moisture Temperature: No Abnormality No Abnormalities Noted: No Dry / Scaly: Yes Maceration: No Moist: No Wound Preparation Ulcer Cleansing: Rinsed/Irrigated with Saline Topical Anesthetic Applied: None Treatment Notes Wound #14 (Left Hand - 1st Digit) 1. Cleansed with: Clean wound with Normal Saline 4. Dressing Applied: Other dressing (specify in notes) 5. Secondary Dressing Applied Bordered Foam Dressing Notes betadine paint, BFD to sacrum only Electronic Signature(s) Signed: 05/29/2015 4:41:51 PM By: Regan Lemming BSN, RN Entered By: Regan Lemming on 05/28/2015 11:42:10 [name] ([id number]) -------------------------------------------------------------------------------- Wound Assessment Details Patient Name: [name] Date of Service: 05/28/2015 11:30 AM Medical Record Number: [id number] Patient Account Number: [id number] Date of Birth/Sex: 10-Jul-1966 (49 y.o. Female) Treating RN: Afful, RN, BSN, [place] Primary Care Physician: Arnette Norris Other Clinician: Referring Physician: Arnette Norris Treating Physician/Extender: Frann Rider in Treatment: 5 Wound  Status Wound Number: 15 Primary Etiology: Trauma, Other Wound Location: Left Hand - 2nd Digit Wound Status: Open Wounding Event: Trauma Date Acquired: 12/04/2014 Weeks Of Treatment: 5 Clustered Wound: No Photos Photo Uploaded By: Regan Lemming on 05/28/2015 13:24:05 Wound Measurements Length: (cm) 1 Width: (cm) 1.4 Depth: (cm) 0.1 Area: (cm) 1.1 Volume: (cm) 0.11 % Reduction in Area: 55.1% % Reduction in Volume: 55.1% Wound Description Classification: Partial Thickness Periwound Skin Texture Texture Color No Abnormalities Noted: No No Abnormalities Noted: No Moisture No Abnormalities Noted: No Treatment Notes Wound #15 (Left Hand - 2nd Digit) 1. Cleansed with: LEIYAH, MAULTSBY R. ([id number]) Clean wound with Normal Saline 4. Dressing Applied: Other dressing (specify in notes) 5. Secondary Dressing Applied Bordered Foam Dressing Notes betadine paint, BFD to sacrum only Electronic Signature(s) Signed: 05/29/2015 4:41:51 PM By: Regan Lemming BSN, RN Entered By: Regan Lemming on 05/28/2015 11:38:21 MAIRELY, FOXWORTH ([id number]) -------------------------------------------------------------------------------- Wound Assessment Details Patient Name: [name] Date of Service: 05/28/2015 11:30 AM Medical Record Number: [id number] Patient Account Number: [id number] Date of Birth/Sex: 1966-04-24 (49 y.o. Female) Treating RN: Baruch Gouty, RN, BSN, Velva Harman Primary Care Physician: Arnette Norris Other Clinician: Referring Physician: Arnette Norris Treating Physician/Extender: Frann Rider in Treatment: 5 Wound Status Wound Number: 16 Primary Etiology: Trauma, Other Wound Location: Left Hand - 3rd Digit Wound Status: Open Wounding Event: Trauma Date Acquired: 12/04/2014 Weeks Of Treatment: 5 Clustered Wound: No Photos Photo Uploaded By: Regan Lemming on 05/28/2015 13:25:09 Wound Measurements Length: (  cm) 1.3 Width: (cm) 0.8 Depth: (cm) 0.2 Area: (cm) 0.817 Volume:  (cm) 0.163 % Reduction in Area: -285.4% % Reduction in Volume: -676.2% Wound Description Classification: Partial Thickness Periwound Skin Texture Texture Color No Abnormalities Noted: No No Abnormalities Noted: No Moisture No Abnormalities Noted: No Treatment Notes Wound #16 (Left Hand - 3rd Digit) 1. Cleansed with: MOSSIE, GILDER R. ([id number]) Clean wound with Normal Saline 4. Dressing Applied: Other dressing (specify in notes) 5. Secondary Dressing Applied Bordered Foam Dressing Notes betadine paint, BFD to sacrum only Electronic Signature(s) Signed: 05/29/2015 4:41:51 PM By: Regan Lemming BSN, RN Entered By: Regan Lemming on 05/28/2015 11:38:22 YAMILETT, ANASTOS ([id number]) -------------------------------------------------------------------------------- Wound Assessment Details Patient Name: [name] Date of Service: 05/28/2015 11:30 AM Medical Record Number: [id number] Patient Account Number: [id number] Date of Birth/Sex: 10-05-1965 (49 y.o. Female) Treating RN: Afful, RN, BSN, [place] Primary Care Physician: Arnette Norris Other Clinician: Referring Physician: Arnette Norris Treating Physician/Extender: Frann Rider in Treatment: 5 Wound Status Wound Number: 17 Primary Etiology: Trauma, Other Wound Location: Left Hand - 4th Digit Wound Status: Open Wounding Event: Trauma Date Acquired: 12/04/2014 Weeks Of Treatment: 5 Clustered Wound: No Photos Photo Uploaded By: Regan Lemming on 05/28/2015 13:25:10 Wound Measurements Length: (cm) 0.8 Width: (cm) 1 Depth: (cm) 0.1 Area: (cm) 0.628 Volume: (cm) 0.063 % Reduction in Area: 0% % Reduction in Volume: 0% Wound Description Classification: Partial Thickness Periwound Skin Texture Texture Color No Abnormalities Noted: No No Abnormalities Noted: No Moisture No Abnormalities Noted: No Treatment Notes Wound #17 (Left Hand - 4th Digit) 1. Cleansed with: SHAQUNA, GEIGLE R. ([id number]) Clean  wound with Normal Saline 4. Dressing Applied: Other dressing (specify in notes) 5. Secondary Dressing Applied Bordered Foam Dressing Notes betadine paint, BFD to sacrum only Electronic Signature(s) Signed: 05/29/2015 4:41:51 PM By: Regan Lemming BSN, RN Entered By: Regan Lemming on 05/28/2015 11:38:22 MALAI, LADY ([id number]) -------------------------------------------------------------------------------- Wound Assessment Details Patient Name: [name] Date of Service: 05/28/2015 11:30 AM Medical Record Number: [id number] Patient Account Number: [id number] Date of Birth/Sex: 07-22-1966 (49 y.o. Female) Treating RN: Afful, RN, BSN, [place] Primary Care Physician: Arnette Norris Other Clinician: Referring Physician: Arnette Norris Treating Physician/Extender: Frann Rider in Treatment: 5 Wound Status Wound Number: 18 Primary Etiology: Trauma, Other Wound Location: Right Hand - 3rd Digit Wound Status: Open Wounding Event: Trauma Date Acquired: 04/29/2015 Weeks Of Treatment: 4 Clustered Wound: No Photos Photo Uploaded By: Regan Lemming on 05/28/2015 13:25:10 Wound Measurements Length: (cm) 0.6 Width: (cm) 1 Depth: (cm) 0.1 Area: (cm) 0.471 Volume: (cm) 0.047 % Reduction in Area: 14.4% % Reduction in Volume: 14.5% Wound Description Classification: Partial Thickness Periwound Skin Texture Texture Color No Abnormalities Noted: No No Abnormalities Noted: No Moisture No Abnormalities Noted: No Treatment Notes Wound #18 (Right Hand - 3rd Digit) 1. Cleansed with: FUMIKO, CHAM R. ([id number]) Clean wound with Normal Saline 4. Dressing Applied: Other dressing (specify in notes) 5. Secondary Dressing Applied Bordered Foam Dressing Notes betadine paint, BFD to sacrum only Electronic Signature(s) Signed: 05/29/2015 4:41:51 PM By: Regan Lemming BSN, RN Entered By: Regan Lemming on 05/28/2015 11:38:22 ABRIL, CAPPIELLO  ([id number]) -------------------------------------------------------------------------------- Vitals Details Patient Name: [name] Date of Service: 05/28/2015 11:30 AM Medical Record Number: [id number] Patient Account Number: [id number] Date of Birth/Sex: 1966-04-01 (49 y.o. Female) Treating RN: Baruch Gouty, RN, BSN, Velva Harman Primary Care Physician: Arnette Norris Other Clinician: Referring Physician: Arnette Norris Treating Physician/Extender: Frann Rider in Treatment: 5 Vital Signs Time Taken: 11:32 Temperature (F): 98.2 Height (  in): 68 Pulse (bpm): 74 Weight (lbs): 220 Respiratory Rate (breaths/min): 16 Body Mass Index (BMI): 33.4 Blood Pressure (mmHg): 124/59 Reference Range: 80 - 120 mg / dl Electronic Signature(s) Signed: 05/29/2015 4:41:51 PM By: Regan Lemming BSN, RN Entered By: Regan Lemming on 05/28/2015 11:32:47

## 2015-05-31 ENCOUNTER — Encounter: Payer: Self-pay | Admitting: Podiatry

## 2015-06-04 ENCOUNTER — Encounter: Payer: Self-pay | Admitting: Podiatry

## 2015-06-04 ENCOUNTER — Ambulatory Visit (INDEPENDENT_AMBULATORY_CARE_PROVIDER_SITE_OTHER): Payer: Medicare Other | Admitting: Podiatry

## 2015-06-04 VITALS — BP 100/84 | HR 67 | Temp 95.4°F | Resp 18

## 2015-06-04 DIAGNOSIS — L97511 Non-pressure chronic ulcer of other part of right foot limited to breakdown of skin: Secondary | ICD-10-CM | POA: Diagnosis not present

## 2015-06-06 NOTE — Progress Notes (Signed)
Patient ID: [name], female   DOB: [birth year], 49 y.o.   MRN: [id number]  Subjective: 49 year old female presents the office they for evaluation of right third toe ulceration. She states that she does continue to change the dressing daily. She is applying antibiotic ointment and a bandage over the area. She states that she is healing although slowly. She denies any surrounding redness or any red streaks. Denies any drainage or purulence. She does have an up coming appointment with infectious disease. She has numerous ulcerations in other areas of her body. She states that she grows used from all the wounds. No other complaints at this time. She currently denies any systemic complaints as fevers, chills, nausea, vomiting. No calf pain, chest pain, shortness of breath.  Objective: AAO 3, NAD DP/PT pulses decreased bilaterally Protective sensation decreased with Simms Weinstein monofilament  The dorsal aspect of the right third toe on the PIPJ visit annular hyperkeratotic lesion. After debridement the wound measures 0.2 x 0.1 cm and is superficial. There is no probing, undermining, tunneling. There is no surrounding erythema, ascending cellulitis, fluctuance, crepitus, malodor. Hammertoe contractures are present bilaterally. There is previous amputations of the right first and second toes as well as the left second and third toes. There are no open lesions or pre-ulcerative lesions identified bilateral lower extremity is otherwise. There is no pain with calf compression, swelling, warmth, erythema.  Assessment: 49 year old female healing right third toe ulceration  Plan: -Treatment options discussed including all alternatives, risks, and complications -Wound was sharp debrided without complications to healthy tissue. The area was cleaned. Silvadene was applied followed by dry so dressing. Continue with daily dressing changes. -Monitor for any clinical signs or symptoms of infection and  directed to call the office immediately should any occur or go to the ER. -Follow-up with infectious disease. Although this wound is healing in the infection has resolved she does go to the wound care center for other issues. She states that her other cultures did grow yeast as well as the toe. She has not been on any treatment for this. -Follow-up in 3 weeks or sooner if any problems arise. In the meantime, encouraged to call the office with any questions, concerns, change in symptoms.   Celesta Gentile, DPM

## 2015-06-10 ENCOUNTER — Telehealth: Payer: Self-pay | Admitting: *Deleted

## 2015-06-10 NOTE — Telephone Encounter (Signed)
Problem list printed and faxed as requested

## 2015-06-10 NOTE — Telephone Encounter (Signed)
Yes ok to fax problem list as requested.

## 2015-06-10 NOTE — Telephone Encounter (Signed)
Spoke to [name] from [facility], who states that the pts claim for a wheelchair has been denied, due to her not meeting the necessary requirements. [name] is wanting to know if a copy of the pts problem list can be sent so that they can review it, and determine if any of her Dx would qualify for her. They are hoping so, but if not, she may be required to have an additional face to face appt. Problem list can be faxed to [phone]

## 2015-06-11 ENCOUNTER — Ambulatory Visit: Payer: Medicare Other | Admitting: Surgery

## 2015-06-13 ENCOUNTER — Ambulatory Visit (INDEPENDENT_AMBULATORY_CARE_PROVIDER_SITE_OTHER): Payer: Medicare Other | Admitting: Infectious Disease

## 2015-06-13 ENCOUNTER — Encounter: Payer: Medicare Other | Attending: Surgery | Admitting: Surgery

## 2015-06-13 ENCOUNTER — Encounter: Payer: Self-pay | Admitting: Infectious Disease

## 2015-06-13 VITALS — BP 114/71 | HR 72 | Temp 98.1°F | Wt 237.0 lb

## 2015-06-13 DIAGNOSIS — L97509 Non-pressure chronic ulcer of other part of unspecified foot with unspecified severity: Secondary | ICD-10-CM

## 2015-06-13 DIAGNOSIS — R031 Nonspecific low blood-pressure reading: Secondary | ICD-10-CM | POA: Insufficient documentation

## 2015-06-13 DIAGNOSIS — Z794 Long term (current) use of insulin: Secondary | ICD-10-CM | POA: Insufficient documentation

## 2015-06-13 DIAGNOSIS — I2510 Atherosclerotic heart disease of native coronary artery without angina pectoris: Secondary | ICD-10-CM | POA: Diagnosis not present

## 2015-06-13 DIAGNOSIS — E0841 Diabetes mellitus due to underlying condition with diabetic mononeuropathy: Secondary | ICD-10-CM

## 2015-06-13 DIAGNOSIS — E1149 Type 2 diabetes mellitus with other diabetic neurological complication: Secondary | ICD-10-CM

## 2015-06-13 DIAGNOSIS — S98132D Complete traumatic amputation of one left lesser toe, subsequent encounter: Secondary | ICD-10-CM

## 2015-06-13 DIAGNOSIS — Z992 Dependence on renal dialysis: Secondary | ICD-10-CM | POA: Diagnosis not present

## 2015-06-13 DIAGNOSIS — S68119A Complete traumatic metacarpophalangeal amputation of unspecified finger, initial encounter: Secondary | ICD-10-CM

## 2015-06-13 DIAGNOSIS — E11621 Type 2 diabetes mellitus with foot ulcer: Secondary | ICD-10-CM

## 2015-06-13 DIAGNOSIS — E1140 Type 2 diabetes mellitus with diabetic neuropathy, unspecified: Secondary | ICD-10-CM | POA: Diagnosis not present

## 2015-06-13 DIAGNOSIS — S61307A Unspecified open wound of left little finger with damage to nail, initial encounter: Secondary | ICD-10-CM | POA: Insufficient documentation

## 2015-06-13 DIAGNOSIS — L89310 Pressure ulcer of right buttock, unstageable: Secondary | ICD-10-CM | POA: Diagnosis present

## 2015-06-13 DIAGNOSIS — S61303A Unspecified open wound of left middle finger with damage to nail, initial encounter: Secondary | ICD-10-CM | POA: Diagnosis not present

## 2015-06-13 DIAGNOSIS — E10621 Type 1 diabetes mellitus with foot ulcer: Secondary | ICD-10-CM

## 2015-06-13 DIAGNOSIS — S61259A Open bite of unspecified finger without damage to nail, initial encounter: Secondary | ICD-10-CM | POA: Insufficient documentation

## 2015-06-13 DIAGNOSIS — B49 Unspecified mycosis: Secondary | ICD-10-CM

## 2015-06-13 DIAGNOSIS — S61300A Unspecified open wound of right index finger with damage to nail, initial encounter: Secondary | ICD-10-CM | POA: Insufficient documentation

## 2015-06-13 DIAGNOSIS — E1122 Type 2 diabetes mellitus with diabetic chronic kidney disease: Secondary | ICD-10-CM | POA: Diagnosis not present

## 2015-06-13 DIAGNOSIS — S61301A Unspecified open wound of left index finger with damage to nail, initial encounter: Secondary | ICD-10-CM | POA: Insufficient documentation

## 2015-06-13 DIAGNOSIS — N186 End stage renal disease: Secondary | ICD-10-CM | POA: Insufficient documentation

## 2015-06-13 DIAGNOSIS — G4733 Obstructive sleep apnea (adult) (pediatric): Secondary | ICD-10-CM | POA: Insufficient documentation

## 2015-06-13 HISTORY — DX: Open bite of unspecified finger without damage to nail, initial encounter: S61.259A

## 2015-06-13 HISTORY — DX: Type 2 diabetes mellitus with foot ulcer: E11.621

## 2015-06-13 HISTORY — DX: Complete traumatic metacarpophalangeal amputation of unspecified finger, initial encounter: S68.119A

## 2015-06-13 HISTORY — DX: Unspecified mycosis: B49
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
## 2015-06-13 NOTE — Progress Notes (Signed)
IVELISSE, CULVERHOUSE ([id number]) Visit Report for 06/13/2015 Chief Complaint Document Details EMMALEAH, MERONEY 06/13/2015 11:30 Patient Name: Date of Service: [name] Medical Record Patient Account Number: [id number] Number: Treating RN: Baruch Gouty, RN, BSN, Velva Harman Date of Birth/Sex: [birth year] (49 y.o. Female) Other Clinician: Primary Care Physician: Arnette Norris Treating [name] Referring Physician: Arnette Norris Physician/Extender: Suella Grove in Treatment: 7 Information Obtained from: Patient Chief Complaint Patient presents to the wound care center for a consult due non healing wound The patient has one area on her right gluteal region and several fingers involved with ulcerations. This has been there for about 3 months. Electronic Signature(s) Signed: 06/13/2015 12:09:33 PM By: [name] MD, FACS Entered By: [name] on 06/13/2015 12:09:33 SHERRIE, MARSAN ([id number]) -------------------------------------------------------------------------------- Debridement Details Sutherlin, Gladine 06/13/2015 11:30 Patient Name: Date of Service: [name] Medical Record Patient Account Number: [id number] Number: Treating RN: Baruch Gouty RN, BSN, Velva Harman Date of Birth/Sex: [birth year] (49 y.o. Female) Other Clinician: Primary Care Physician: Arnette Norris Treating [name] Referring Physician: Arnette Norris Physician/Extender: Suella Grove in Treatment: 7 Debridement Performed for Wound #10 Right Sacrum Assessment: Performed By: Physician [name], MD Debridement: Debridement Pre-procedure Yes Verification/Time Out Taken: Start Time: 11:45 Pain Control: Lidocaine 5% topical ointment Level: Skin/Subcutaneous Tissue Total Area Debrided (L x 1 (cm) x 2.5 (cm) = 2.5 (cm) W): Tissue and other Viable, Non-Viable, Eschar, Skin, Subcutaneous material debrided: Bleeding: Minimum Hemostasis Achieved: Pressure End Time: 11:48 Procedural Pain: 2 Post Procedural Pain:  0 Response to Treatment: Procedure was tolerated well Post Debridement Measurements of Total Wound Length: (cm) 1 Stage: Category/Stage III Width: (cm) 2.5 Depth: (cm) 0.3 Volume: (cm) 0.589 Post Procedure Diagnosis Same as Pre-procedure Electronic Signature(s) Signed: 06/13/2015 12:09:27 PM By: [name] MD, FACS Signed: 06/13/2015 4:15:02 PM By: Regan Lemming BSN, RN Entered By: [name] on 06/13/2015 12:09:27 CRISTAN, HOUT ([id number]) -------------------------------------------------------------------------------- HPI Details Arana, Shyah 06/13/2015 11:30 Patient Name: Date of Service: [name] Medical Record Patient Account Number: [id number] Number: Treating RN: Baruch Gouty RN, BSN, Velva Harman Date of Birth/Sex: Nov 12, 1965 (49 y.o. Female) Other Clinician: Primary Care Physician: Arnette Norris Treating [name] Referring Physician: Arnette Norris Physician/Extender: Suella Grove in Treatment: 7 History of Present Illness Location: right gluteal area and 3 fingers on her right hand and 4 fingers on her left hand involved with ulceration. Quality: Patient reports No Pain. Severity: Patient states wound are getting worse. Duration: Patient has had the wound for > 3 months prior to seeking treatment at the wound center Context: The wound appeared gradually over time Modifying Factors: Patient is currently on renal dialysis and receives treatments 3 times weekly HPI Description: Pleasant 49 year old patient who is known to the wound clinic from previous visits. She has significant diabetic neuropathy and has no sensation in her hands and feet. She is wheelchair-bound and takes dialysis on a regular basis 3 times a week. 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 Past medical history significant for diabetes mellitus,end-stage renal  disease on hemodialysis, coronary artery disease, multiple amputations, sleep apnea, wheelchair bound due to severe peripheral neuropathy. She has now declined quite significantly and is wheelchair-bound all the time. She has developed decubitus ulcers of the skin and has been referred to [place] by her PCP. She was also recently seen by Dr. Celesta Gentile for her feet and he noticed that she had a ulceration of the  right third toe and put her on Levaquin and local care. She says she is laying down in bed much more now and has been trying to offload as much as possible. Due to a psychiatric issue she has been biting her fingernails and has caused much ulceration to both hands. Part of it may be related to the medication she was on. 05/14/2015 -- she says she is very sore around her anus and the vagina and has a lot of redness and problems there. I have reviewed her x-rays which were done of both hands and they show: X-ray of the left hand shows no acute fracture, dislocation or convincing osteomyelitis. X-ray of the right hand shows deformities of the distal phalanx of the first second and third digits with peripheral vascular disease but no evidence of acute fracture dislocation or osteomyelitis. 05/28/2015 -- she has seen the dermatologist and will also be seeing the infectious disease doctor soon and is on a proper regimen for her multiple ailments. Electronic Signature(s) Signed: 06/13/2015 12:09:37 PM By: [name] MD, FACS Entered By: [name] on 06/13/2015 12:09:37 ZYLIE, MUMAW ([id number], ENSLIN ([id number]) -------------------------------------------------------------------------------- Physical Exam Details MARKEITA, ALICIA 06/13/2015 11:30 Patient Name: Date of Service: [name] Medical Record Patient Account Number: [id number] Number: Treating RN: Baruch Gouty RN, BSN, Velva Harman Date of Birth/Sex: 1965-11-06 (49 y.o. Female) Other Clinician: Primary  Care Physician: Arnette Norris Treating [name] Referring Physician: Arnette Norris Physician/Extender: Suella Grove in Treatment: 7 Constitutional . Pulse regular. Respirations normal and unlabored. Afebrile. . Eyes Nonicteric. Reactive to light. Ears, Nose, Mouth, and Throat Lips, teeth, and gums WNL.Marland Kitchen Moist mucosa without lesions . Neck supple and nontender. No palpable supraclavicular or cervical adenopathy. Normal sized without goiter. Respiratory WNL. No retractions.. Cardiovascular Pedal Pulses WNL. No clubbing, cyanosis or edema. Chest Breasts symmetical and no nipple discharge.. Breast tissue WNL, no masses, lumps, or tenderness.. Lymphatic No adneopathy. No adenopathy. No adenopathy. Musculoskeletal Adexa without tenderness or enlargement.. Digits and nails w/o clubbing, cyanosis, infection, petechiae, ischemia, or inflammatory conditions.. Integumentary (Hair, Skin) No suspicious lesions. No crepitus or fluctuance. No peri-wound warmth or erythema. No masses.Marland Kitchen Psychiatric Judgement and insight Intact.. No evidence of depression, anxiety, or agitation.. Notes her right thumb and the left forefinger still continued to have active ulcerations due to her chewing on her nails. The eschar on the right gluteal area was sharply debrided down to the subcutaneous tissue. Electronic Signature(s) Signed: 06/13/2015 12:10:30 PM By: [name] MD, FACS Entered By: [name] on 06/13/2015 12:10:30 CARROLL, RANNEY ([id number]) -------------------------------------------------------------------------------- Physician Orders Details LABRIA, WOS 06/13/2015 11:30 Patient Name: Date of Service: [name] Medical Record Patient Account Number: [id number] Number: Treating RN: Baruch Gouty RN, BSN, Velva Harman Date of Birth/Sex: 1966-04-25 (49 y.o. Female) Other Clinician: Primary Care Physician: Arnette Norris Treating [name] Referring Physician: Arnette Norris Physician/Extender: Suella Grove in Treatment: 7 Verbal / Phone Orders: Yes Clinician: Afful, RN, BSN, Rita Read Back and Verified: Yes Diagnosis Coding Wound Cleansing Wound #10 Right Sacrum o Clean wound with Normal Saline. Wound #12 Right Hand - 1st Digit o Clean wound with Normal Saline. Wound #14 Left Hand - 1st Digit o Clean wound with Normal Saline. Wound #15 Left Hand - 2nd Digit o Clean wound with Normal Saline. Wound #16 Left Hand - 3rd Digit o Clean wound with Normal Saline. Wound #17 Left Hand - 4th Digit o Clean wound with Normal Saline. Primary Wound Dressing Wound #10 Right Sacrum o Hydrogel Wound #12 Right Hand -  1st Digit o Other: - betadine Wound #14 Left Hand - 1st Digit o Other: - betadine Wound #15 Left Hand - 2nd Digit o Other: - betadine Wound #16 Left Hand - 3rd Digit o Other: - betadine Wound #17 Left Hand - 4th Digit Detamore, Britnee R. ([id number]) o Other: - betadine Secondary Dressing Wound #10 Right Sacrum o Boardered Foam Dressing Dressing Change Frequency Wound #10 Right Sacrum o Change dressing every day. Wound #12 Right Hand - 1st Digit o Change dressing every day. Wound #14 Left Hand - 1st Digit o Change dressing every day. Wound #15 Left Hand - 2nd Digit o Change dressing every day. Wound #16 Left Hand - 3rd Digit o Change dressing every day. Wound #17 Left Hand - 4th Digit o Change dressing every day. Follow-up Appointments Wound #10 Right Sacrum o Return Appointment in 1 week. Wound #12 Right Hand - 1st Digit o Return Appointment in 1 week. Wound #14 Left Hand - 1st Digit o Return Appointment in 1 week. Wound #15 Left Hand - 2nd Digit o Return Appointment in 1 week. Wound #16 Left Hand - 3rd Digit o Return Appointment in 1 week. Wound #17 Left Hand - 4th Digit o Return Appointment in 1 week. Off-Loading Wound #10 Right Sacrum o Turn and reposition every 2  hours o Mattress - appropriate support surface for mattress JAYDYN, MENON ([id number]) Electronic Signature(s) Signed: 06/13/2015 3:40:39 PM By: [name] MD, FACS Signed: 06/13/2015 4:15:02 PM By: Regan Lemming BSN, RN Entered By: Regan Lemming on 06/13/2015 11:57:56 BESS, SALTZMAN ([id number]) -------------------------------------------------------------------------------- Problem List Details NASHIKA, COKER 06/13/2015 11:30 Patient Name: Date of Service: [name] Medical Record Patient Account Number: [id number] Number: Treating RN: Baruch Gouty RN, BSN, Velva Harman Date of Birth/Sex: Sep 27, 1965 (49 y.o. Female) Other Clinician: Primary Care Physician: Arnette Norris Treating [name] Referring Physician: Arnette Norris Physician/Extender: Suella Grove in Treatment: 7 Active Problems ICD-10 Encounter Code Description Active Date Diagnosis E11.40 Type 2 diabetes mellitus with diabetic neuropathy, 04/23/2015 Yes unspecified Z99.2 Dependence on renal dialysis 04/23/2015 Yes L89.310 Pressure ulcer of right buttock, unstageable 04/23/2015 Yes S61.300A Unspecified open wound of right index finger with damage 04/23/2015 Yes to nail, initial encounter S61.301A Unspecified open wound of left index finger with damage 04/23/2015 Yes to nail, initial encounter S61.307A Unspecified open wound of left little finger with damage to 04/23/2015 Yes nail, initial encounter S61.303A Unspecified open wound of left middle finger with damage 04/23/2015 Yes to nail, initial encounter Inactive Problems Resolved Problems Electronic Signature(s) DAMETRIA, TUZZOLINO ([id number]) Signed: 06/13/2015 12:08:12 PM By: [name] MD, FACS Entered By: [name] on 06/13/2015 12:08:12 [name] ([id number]) -------------------------------------------------------------------------------- Progress Note Details Zobel, [place] 06/13/2015 11:30 Patient Name: Date of Service: R.  AM Medical Record Patient Account Number: [id number] Number: Treating RN: Baruch Gouty RN, BSN, Velva Harman Date of Birth/Sex: [birth year] (49 y.o. Female) Other Clinician: Primary Care Physician: Arnette Norris Treating [name] Referring Physician: Arnette Norris Physician/Extender: Suella Grove in Treatment: 7 Subjective Chief Complaint Information obtained from Patient Patient presents to the wound care center for a consult due non healing wound The patient has one area on her right gluteal region and several fingers involved with ulcerations. This has been there for about 3 months. History of Present Illness (HPI) The following HPI elements were documented for the patient's wound: Location: right gluteal area and 3 fingers on her right hand and 4 fingers on her left hand involved with ulceration. Quality: Patient reports No Pain. Severity: Patient states wound  are getting worse. Duration: Patient has had the wound for > 3 months prior to seeking treatment at the wound center Context: The wound appeared gradually over time Modifying Factors: Patient is currently on renal dialysis and receives treatments 3 times weekly Pleasant 49 year old patient who is known to the wound clinic from previous visits. She has significant diabetic neuropathy and has no sensation in her hands and feet. She is wheelchair-bound and takes dialysis on a regular basis 3 times a week. 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 Past medical history significant for diabetes mellitus,end-stage renal disease on hemodialysis, coronary artery disease, multiple amputations, sleep apnea, wheelchair bound due to severe peripheral neuropathy. She has now declined quite significantly and is wheelchair-bound all the time. She has developed decubitus ulcers of the skin and has been referred to [place] by her  PCP. She was also recently seen by Dr. Celesta Gentile for her feet and he noticed that she had a ulceration of the right third toe and put her on Levaquin and local care. She says she is laying down in bed much more now and has been trying to offload as much as possible. Due to a psychiatric issue she has been biting her fingernails and has caused much ulceration to both hands. Part of it may be related to the medication she was on. 05/14/2015 -- she says she is very sore around her anus and the vagina and has a lot of redness and problems there. I have reviewed her x-rays which were done of both hands and they show: X-ray of the left hand shows no acute fracture, dislocation or convincing osteomyelitis. X-ray of the right hand shows deformities of the ELINORE, SHULTS. ([id number]) distal phalanx of the first second and third digits with peripheral vascular disease but no evidence of acute fracture dislocation or osteomyelitis. 05/28/2015 -- she has seen the dermatologist and will also be seeing the infectious disease doctor soon and is on a proper regimen for her multiple ailments. Objective Constitutional Pulse regular. Respirations normal and unlabored. Afebrile. Vitals Time Taken: 11:35 AM, Height: 68 in, Weight: 220 lbs, BMI: 33.4, Temperature: 98.0 F, Pulse: 76 bpm, Respiratory Rate: 18 breaths/min, Blood Pressure: 116/68 mmHg. Eyes Nonicteric. Reactive to light. Ears, Nose, Mouth, and Throat Lips, teeth, and gums WNL.Marland Kitchen Moist mucosa without lesions . Neck supple and nontender. No palpable supraclavicular or cervical adenopathy. Normal sized without goiter. Respiratory WNL. No retractions.. Cardiovascular Pedal Pulses WNL. No clubbing, cyanosis or edema. Chest Breasts symmetical and no nipple discharge.. Breast tissue WNL, no masses, lumps, or tenderness.. Lymphatic No adneopathy. No adenopathy. No adenopathy. Musculoskeletal Adexa without tenderness or enlargement..  Digits and nails w/o clubbing, cyanosis, infection, petechiae, ischemia, or inflammatory conditions.Marland Kitchen Psychiatric Judgement and insight Intact.. No evidence of depression, anxiety, or agitation.. General Notes: her right thumb and the left forefinger still continued to have active ulcerations due to her chewing on her nails. The eschar on the right gluteal area was sharply debrided down to the subcutaneous Labra, Verneal R. ([id number]) tissue. Integumentary (Hair, Skin) No suspicious lesions. No crepitus or fluctuance. No peri-wound warmth or erythema. No masses.. Wound #10 status is Open. Original cause of wound was Pressure Injury. The wound is located on the Right Sacrum. The wound measures 1cm length x 2.5cm width x 0.1cm depth; 1.963cm^2 area and 0.196cm^3 volume. The wound is limited to skin  breakdown. There is no tunneling or undermining noted. There is a none present amount of drainage noted. The wound margin is distinct with the outline attached to the wound base. There is no granulation within the wound bed. There is a large (67-100%) amount of necrotic tissue within the wound bed including Eschar. The periwound skin appearance exhibited: Dry/Scaly. The periwound skin appearance did not exhibit: Callus, Crepitus, Excoriation, Fluctuance, Friable, Induration, Localized Edema, Rash, Scarring, Maceration, Moist, Atrophie Blanche, Cyanosis, Ecchymosis, Hemosiderin Staining, Mottled, Pallor, Rubor, Erythema. Periwound temperature was noted as No Abnormality. Wound #12 status is Open. Original cause of wound was Trauma. The wound is located on the Right Hand - 1st Digit. The wound measures 1cm length x 1.5cm width x 0.1cm depth; 1.178cm^2 area and 0.118cm^3 volume. The wound is limited to skin breakdown. There is no tunneling or undermining noted. There is a none present amount of drainage noted. The wound margin is flat and intact. There is medium (34-66%) red granulation within the  wound bed. There is a medium (34-66%) amount of necrotic tissue within the wound bed including Eschar. The periwound skin appearance exhibited: Dry/Scaly. The periwound skin appearance did not exhibit: Callus, Crepitus, Excoriation, Fluctuance, Friable, Induration, Localized Edema, Rash, Scarring, Maceration, Moist, Atrophie Blanche, Cyanosis, Ecchymosis, Hemosiderin Staining, Mottled, Pallor, Rubor, Erythema. Periwound temperature was noted as No Abnormality. Wound #13 status is Healed - Epithelialized. Original cause of wound was Trauma. The wound is located on the Right Hand - 2nd Digit. The wound measures 0cm length x 0cm width x 0cm depth; 0cm^2 area and 0cm^3 volume. Wound #14 status is Open. Original cause of wound was Trauma. The wound is located on the Left Hand - 1st Digit. The wound measures 0.8cm length x 1.2cm width x 0.1cm depth; 0.754cm^2 area and 0.075cm^3 volume. The wound is limited to skin breakdown. There is no tunneling or undermining noted. There is a none present amount of drainage noted. The wound margin is flat and intact. There is small (1-33%) granulation within the wound bed. There is a large (67-100%) amount of necrotic tissue within the wound bed including Eschar. The periwound skin appearance exhibited: Dry/Scaly. The periwound skin appearance did not exhibit: Callus, Crepitus, Excoriation, Fluctuance, Friable, Induration, Localized Edema, Rash, Scarring, Maceration, Moist, Atrophie Blanche, Cyanosis, Ecchymosis, Hemosiderin Staining, Mottled, Pallor, Rubor, Erythema. Periwound temperature was noted as No Abnormality. Wound #15 status is Open. Original cause of wound was Trauma. The wound is located on the Left Hand - 2nd Digit. The wound measures 0.5cm length x 1cm width x 0.1cm depth; 0.393cm^2 area and 0.039cm^3 volume. The wound is limited to skin breakdown. There is no tunneling or undermining noted. There is a none present amount of drainage noted. The wound  margin is distinct with the outline attached to the wound base. There is no granulation within the wound bed. There is a large (67-100%) amount of necrotic tissue within the wound bed including Eschar. The periwound skin appearance exhibited: Dry/Scaly. The periwound skin appearance did not exhibit: Callus, Crepitus, Excoriation, Fluctuance, Friable, Induration, Localized Edema, Rash, Scarring, Maceration, Moist, Atrophie Blanche, Cyanosis, Ecchymosis, Hemosiderin Staining, Mottled, Pallor, Rubor, Erythema. Periwound temperature was noted as No Abnormality. SARA, KEYS R. ([id number]) Wound #16 status is Open. Original cause of wound was Trauma. The wound is located on the Left Hand - 3rd Digit. The wound measures 1cm length x 1.2cm width x 0.2cm depth; 0.942cm^2 area and 0.188cm^3 volume. The wound is limited to skin breakdown. There is no tunneling or undermining  noted. There is a none present amount of drainage noted. The wound margin is distinct with the outline attached to the wound base. There is no granulation within the wound bed. There is a large (67-100%) amount of necrotic tissue within the wound bed including Eschar. The periwound skin appearance exhibited: Dry/Scaly. The periwound skin appearance did not exhibit: Callus, Crepitus, Excoriation, Fluctuance, Friable, Induration, Localized Edema, Rash, Scarring, Maceration, Moist, Atrophie Blanche, Cyanosis, Ecchymosis, Hemosiderin Staining, Mottled, Pallor, Rubor, Erythema. Periwound temperature was noted as No Abnormality. Wound #17 status is Open. Original cause of wound was Trauma. The wound is located on the Left Hand - 4th Digit. The wound measures 0.8cm length x 1cm width x 0.1cm depth; 0.628cm^2 area and 0.063cm^3 volume. The wound is limited to skin breakdown. There is no tunneling or undermining noted. There is a none present amount of drainage noted. The wound margin is distinct with the outline attached to the wound  base. There is no granulation within the wound bed. There is a large (67-100%) amount of necrotic tissue within the wound bed including Eschar. The periwound skin appearance exhibited: Dry/Scaly. The periwound skin appearance did not exhibit: Callus, Crepitus, Excoriation, Fluctuance, Friable, Induration, Localized Edema, Rash, Scarring, Maceration, Moist, Atrophie Blanche, Cyanosis, Ecchymosis, Hemosiderin Staining, Mottled, Pallor, Rubor, Erythema. Periwound temperature was noted as No Abnormality. Wound #18 status is Healed - Epithelialized. Original cause of wound was Trauma. The wound is located on the Right Hand - 3rd Digit. The wound measures 0cm length x 0cm width x 0cm depth; 0cm^2 area and 0cm^3 volume. Assessment Active Problems ICD-10 E11.40 - Type 2 diabetes mellitus with diabetic neuropathy, unspecified Z99.2 - Dependence on renal dialysis L89.310 - Pressure ulcer of right buttock, unstageable S61.300A - Unspecified open wound of right index finger with damage to nail, initial encounter S61.301A - Unspecified open wound of left index finger with damage to nail, initial encounter S61.307A - Unspecified open wound of left little finger with damage to nail, initial encounter S61.303A - Unspecified open wound of left middle finger with damage to nail, initial encounter I have recommended hydrogel on the gluteal region with a bordered foam. The rest of the fingertips can be painted with Betadine. She is again encouraged to offload as much as possible and try her best to wear some gloves so that she does not chew her nails and fingertips. We will see her back next week. LUTICIA, TADROS R. ([id number]) Procedures Wound #10 Wound #10 is a Pressure Ulcer located on the Right Sacrum . There was a Skin/Subcutaneous Tissue Debridement ([id number]) debridement with total area of 2.5 sq cm performed by [name], MD. to remove Viable and Non-Viable tissue/material including Eschar,  Skin, and Subcutaneous after achieving pain control using Lidocaine 5% topical ointment. A time out was conducted prior to the start of the procedure. A Minimum amount of bleeding was controlled with Pressure. The procedure was tolerated well with a pain level of 2 throughout and a pain level of 0 following the procedure. Post Debridement Measurements: 1cm length x 2.5cm width x 0.3cm depth; 0.589cm^3 volume. Post debridement Stage noted as Category/Stage III. Post procedure Diagnosis Wound #10: Same as Pre-Procedure Plan Wound Cleansing: Wound #10 Right Sacrum: Clean wound with Normal Saline. Wound #12 Right Hand - 1st Digit: Clean wound with Normal Saline. Wound #14 Left Hand - 1st Digit: Clean wound with Normal Saline. Wound #15 Left Hand - 2nd Digit: Clean wound with Normal Saline. Wound #16 Left Hand - 3rd Digit: Clean wound  with Normal Saline. Wound #17 Left Hand - 4th Digit: Clean wound with Normal Saline. Primary Wound Dressing: Wound #10 Right Sacrum: Hydrogel Wound #12 Right Hand - 1st Digit: Other: - betadine Wound #14 Left Hand - 1st Digit: Other: - betadine Wound #15 Left Hand - 2nd Digit: Other: - betadine Wound #16 Left Hand - 3rd Digit: Other: - betadine Wound #17 Left Hand - 4th Digit: Other: - betadine Secondary Dressing: Wound #10 Right Sacrum: Boardered Foam Dressing Grimaldo, Alcie R. ([id number]) Dressing Change Frequency: Wound #10 Right Sacrum: Change dressing every day. Wound #12 Right Hand - 1st Digit: Change dressing every day. Wound #14 Left Hand - 1st Digit: Change dressing every day. Wound #15 Left Hand - 2nd Digit: Change dressing every day. Wound #16 Left Hand - 3rd Digit: Change dressing every day. Wound #17 Left Hand - 4th Digit: Change dressing every day. Follow-up Appointments: Wound #10 Right Sacrum: Return Appointment in 1 week. Wound #12 Right Hand - 1st Digit: Return Appointment in 1 week. Wound #14 Left Hand - 1st  Digit: Return Appointment in 1 week. Wound #15 Left Hand - 2nd Digit: Return Appointment in 1 week. Wound #16 Left Hand - 3rd Digit: Return Appointment in 1 week. Wound #17 Left Hand - 4th Digit: Return Appointment in 1 week. Off-Loading: Wound #10 Right Sacrum: Turn and reposition every 2 hours Mattress - appropriate support surface for mattress I have recommended hydrogel on the gluteal region with a bordered foam. The rest of the fingertips can be painted with Betadine. She is again encouraged to offload as much as possible and try her best to wear some gloves so that she does not chew her nails and fingertips. We will see her back next week. Electronic Signature(s) Signed: 06/13/2015 12:11:34 PM By: [name] MD, FACS Entered By: [name] on 06/13/2015 12:11:33 DERHONDA, EASTLICK ([id number]) -------------------------------------------------------------------------------- SuperBill Details Patient Name: [name] Date of Service: 06/13/2015 Medical Record Number: [id number] Patient Account Number: [id number] Date of Birth/Sex: December 03, 1965 (49 y.o. Female) Treating RN: Afful, RN, BSN, [place] Primary Care Physician: Arnette Norris Other Clinician: Referring Physician: Arnette Norris Treating Physician/Extender: Frann Rider in Treatment: 7 Diagnosis Coding ICD-10 Codes Code Description E11.40 Type 2 diabetes mellitus with diabetic neuropathy, unspecified Z99.2 Dependence on renal dialysis L89.310 Pressure ulcer of right buttock, unstageable S61.300A Unspecified open wound of right index finger with damage to nail, initial encounter S61.301A Unspecified open wound of left index finger with damage to nail, initial encounter S61.307A Unspecified open wound of left little finger with damage to nail, initial encounter S61.303A Unspecified open wound of left middle finger with damage to nail, initial encounter Facility Procedures CPT4 Code: [id number] Description:  09381 - DEB SUBQ TISSUE 20 SQ CM/< ICD-10 Description Diagnosis E11.40 Type 2 diabetes mellitus with diabetic neuropathy, L89.310 Pressure ulcer of right buttock, unstageable Modifier: unspecified Quantity: 1 Physician Procedures CPT4 Code: [id number] Description: 69678 - WC PHYS SUBQ TISS 20 SQ CM ICD-10 Description Diagnosis E11.40 Type 2 diabetes mellitus with diabetic neuropathy, L89.310 Pressure ulcer of right buttock, unstageable Modifier: unspecified Quantity: 1 Electronic Signature(s) Signed: 06/13/2015 12:11:47 PM By: [name] MD, FACS Entered By: [name] on 06/13/2015 12:11:47

## 2015-06-13 NOTE — Progress Notes (Signed)
Electronic Signature(s) Signed:  06/13/2015 4:15:02 PM By: Regan Lemming BSN, RN Entered By: Regan Lemming on 06/13/2015 11:41:02 [name] ([id number]) -------------------------------------------------------------------------------- Wound Assessment Details Patient Name: [name] Date of Service: 06/13/2015 11:30 AM Medical Record Number: [id number] Patient Account Number: [id number] Date of Birth/Sex: [birth year] (49 y.o. Female) Treating RN: Afful, RN, BSN, [place] Primary Care Physician: Arnette Norris Other Clinician: Referring Physician: Arnette Norris Treating Physician/Extender: Frann Rider in Treatment: 7 Wound Status Wound Number: 16 Primary Trauma, Other Etiology: Wound Location: Left Hand - 3rd Digit Wound Open Wounding Event: Trauma Status: Date Acquired: 12/04/2014 Comorbid Cataracts, Anemia, Type II Diabetes, Weeks Of Treatment: 7 History: End Stage Renal Disease, History of Clustered Wound: No Burn, Neuropathy Photos Photo Uploaded By: Regan Lemming on 06/13/2015 16:05:54 Wound Measurements Length: (cm) 1 Width: (cm) 1.2 Depth: (cm) 0.2 Area: (cm) 0.942 Volume: (cm) 0.188 % Reduction in Area: -344.3% % Reduction in Volume: -795.2% Epithelialization: None Tunneling: No Undermining: No Wound Description Classification: Partial Thickness Wound Margin: Distinct, outline attached Exudate Amount: None Present Foul Odor After Cleansing: No Wound Bed Granulation Amount: None Present (0%) Exposed Structure Necrotic Amount: Large (67-100%) Fascia Exposed: No Necrotic Quality: Eschar Fat Layer Exposed: No Tendon Exposed: No Muscle Exposed: No Joint Exposed: No [name], Tonya R. ([id number]) Bone Exposed: No Limited to Skin Breakdown Periwound Skin Texture Texture Color No Abnormalities Noted: No No Abnormalities Noted: No Callus: No Atrophie Blanche: No Crepitus: No Cyanosis: No Excoriation: No Ecchymosis: No Fluctuance: No Erythema: No Friable:  No Hemosiderin Staining: No Induration: No Mottled: No Localized Edema: No Pallor: No Rash: No Rubor: No Scarring: No Temperature / Pain Moisture Temperature: No Abnormality No Abnormalities Noted: No Dry / Scaly: Yes Maceration: No Moist: No Wound Preparation Ulcer Cleansing: Rinsed/Irrigated with Saline Topical Anesthetic Applied: None Treatment Notes Wound #16 (Left Hand - 3rd Digit) 1. Cleansed with: Clean wound with Normal Saline 4. Dressing Applied: Hydrogel Other dressing (specify in notes) 5. Secondary Dressing Applied Bordered Foam Dressing Notes betadine paint, hydrogel to sacrum and BFD to sacrum only Electronic Signature(s) Signed: 06/13/2015 4:15:02 PM By: Regan Lemming BSN, RN Entered By: Regan Lemming on 06/13/2015 11:41:41 [name] ([id number]) -------------------------------------------------------------------------------- Wound Assessment Details Patient Name: [name] Date of Service: 06/13/2015 11:30 AM Medical Record Number: [id number] Patient Account Number: [id number] Date of Birth/Sex: 18-Nov-1965 (49 y.o. Female) Treating RN: Afful, RN, BSN, [place] Primary Care Physician: Arnette Norris Other Clinician: Referring Physician: Arnette Norris Treating Physician/Extender: Frann Rider in Treatment: 7 Wound Status Wound Number: 17 Primary Trauma, Other Etiology: Wound Location: Left Hand - 4th Digit Wound Open Wounding Event: Trauma Status: Date Acquired: 12/04/2014 Comorbid Cataracts, Anemia, Type II Diabetes, Weeks Of Treatment: 7 History: End Stage Renal Disease, History of Clustered Wound: No Burn, Neuropathy Photos Photo Uploaded By: Regan Lemming on 06/13/2015 16:05:54 Wound Measurements Length: (cm) 0.8 Width: (cm) 1 Depth: (cm) 0.1 Area: (cm) 0.628 Volume: (cm) 0.063 % Reduction in Area: 0% % Reduction in Volume: 0% Epithelialization: None Tunneling: No Undermining: No Wound Description Classification:  Partial Thickness Wound Margin: Distinct, outline attached Exudate Amount: None Present Foul Odor After Cleansing: No Wound Bed Granulation Amount: None Present (0%) Exposed Structure Necrotic Amount: Large (67-100%) Fascia Exposed: No Necrotic Quality: Eschar Fat Layer Exposed: No Tendon Exposed: No Muscle Exposed: No Joint Exposed: No [name], Tonya R. ([id number]) Bone Exposed: No Limited to Skin Breakdown Periwound Skin Texture Texture Color No Abnormalities Noted: No No Abnormalities Noted: No Callus: No Atrophie Blanche:  11:30 AM Medical Record Number: [id number] Patient Account Number: [id number] Date of Birth/Sex: [birth year] (49 y.o. Female) Treating RN: Afful, RN, BSN, [place] Primary Care Physician: Arnette Norris Other Clinician: Referring Physician: Arnette Norris Treating Physician/Extender: Frann Rider in Treatment: 7 Wound Status Wound Number: 12 Primary Trauma, Other Etiology: Wound Location: Right Hand - 1st Digit Wound Open Wounding Event: Trauma Status: Date Acquired: 12/04/2014 Comorbid Cataracts, Anemia, Type II Diabetes, Weeks Of Treatment: 7 History: End Stage Renal Disease, History of Clustered Wound: No Burn, Neuropathy Photos Photo Uploaded By: Regan Lemming on 06/13/2015 16:03:43 Wound Measurements Length: (cm) 1 Width: (cm) 1.5 Depth: (cm) 0.1 Area: (cm) 1.178 Volume: (cm) 0.118 % Reduction in Area: 3.8% % Reduction in Volume: 4.1% Epithelialization: None Tunneling: No Undermining: No Wound Description Classification: Partial Thickness Wound Margin: Flat and Intact Exudate Amount: None Present Foul Odor After Cleansing: No Wound  Bed Granulation Amount: Medium (34-66%) Exposed Structure Granulation Quality: Red Fascia Exposed: No Necrotic Amount: Medium (34-66%) Fat Layer Exposed: No Necrotic Quality: Eschar Tendon Exposed: No Muscle Exposed: No Joint Exposed: No [name], Tonya R. ([id number]) Bone Exposed: No Limited to Skin Breakdown Periwound Skin Texture Texture Color No Abnormalities Noted: No No Abnormalities Noted: No Callus: No Atrophie Blanche: No Crepitus: No Cyanosis: No Excoriation: No Ecchymosis: No Fluctuance: No Erythema: No Friable: No Hemosiderin Staining: No Induration: No Mottled: No Localized Edema: No Pallor: No Rash: No Rubor: No Scarring: No Temperature / Pain Moisture Temperature: No Abnormality No Abnormalities Noted: No Dry / Scaly: Yes Maceration: No Moist: No Wound Preparation Ulcer Cleansing: Rinsed/Irrigated with Saline Topical Anesthetic Applied: None Treatment Notes Wound #12 (Right Hand - 1st Digit) 1. Cleansed with: Clean wound with Normal Saline 4. Dressing Applied: Hydrogel Other dressing (specify in notes) 5. Secondary Dressing Applied Bordered Foam Dressing Notes betadine paint, hydrogel to sacrum and BFD to sacrum only Electronic Signature(s) Signed: 06/13/2015 4:15:02 PM By: Regan Lemming BSN, RN Entered By: Regan Lemming on 06/13/2015 11:40:09 [name] ([id number]) -------------------------------------------------------------------------------- Wound Assessment Details Patient Name: [name] Date of Service: 06/13/2015 11:30 AM Medical Record Number: [id number] Patient Account Number: [id number] Date of Birth/Sex: 03-Apr-1966 (49 y.o. Female) Treating RN: Afful, RN, BSN, [place] Primary Care Physician: Arnette Norris Other Clinician: Referring Physician: Arnette Norris Treating Physician/Extender: Frann Rider in Treatment: 7 Wound Status Wound Number: 13 Primary Etiology: Trauma, Other Wound Location: Right Hand -  2nd Digit Wound Status: Healed - Epithelialized Wounding Event: Trauma Date Acquired: 12/04/2014 Weeks Of Treatment: 7 Clustered Wound: No Photos Photo Uploaded By: Regan Lemming on 06/13/2015 16:03:43 Wound Measurements Length: (cm) 0 Width: (cm) 0 Depth: (cm) 0 Area: (cm) 0 Volume: (cm) 0 % Reduction in Area: 100% % Reduction in Volume: 100% Wound Description Classification: Partial Thickness Periwound Skin Texture Texture Color No Abnormalities Noted: No No Abnormalities Noted: No Moisture No Abnormalities Noted: No Electronic Signature(s) Signed: 06/13/2015 4:15:02 PM By: Regan Lemming BSN, RN [name], Tonya Sewer ([id number]) Entered By: Regan Lemming on 06/13/2015 11:39:27 [name] ([id number]) -------------------------------------------------------------------------------- Wound Assessment Details Patient Name: [name] Date of Service: 06/13/2015 11:30 AM Medical Record Number: [id number] Patient Account Number: [id number] Date of Birth/Sex: 23-May-1966 (49 y.o. Female) Treating RN: Baruch Gouty, RN, BSN, [place] Primary Care Physician: Arnette Norris Other Clinician: Referring Physician: Arnette Norris Treating Physician/Extender: Frann Rider in Treatment: 7 Wound Status Wound Number: 14 Primary Trauma, Other Etiology: Wound Location: Left Hand - 1st Digit Wound Open Wounding Event: Trauma Status: Date Acquired: 12/04/2014 Comorbid Cataracts, Anemia,  Electronic Signature(s) Signed:  06/13/2015 4:15:02 PM By: Regan Lemming BSN, RN Entered By: Regan Lemming on 06/13/2015 11:41:02 [name] ([id number]) -------------------------------------------------------------------------------- Wound Assessment Details Patient Name: [name] Date of Service: 06/13/2015 11:30 AM Medical Record Number: [id number] Patient Account Number: [id number] Date of Birth/Sex: [birth year] (49 y.o. Female) Treating RN: Afful, RN, BSN, [place] Primary Care Physician: Arnette Norris Other Clinician: Referring Physician: Arnette Norris Treating Physician/Extender: Frann Rider in Treatment: 7 Wound Status Wound Number: 16 Primary Trauma, Other Etiology: Wound Location: Left Hand - 3rd Digit Wound Open Wounding Event: Trauma Status: Date Acquired: 12/04/2014 Comorbid Cataracts, Anemia, Type II Diabetes, Weeks Of Treatment: 7 History: End Stage Renal Disease, History of Clustered Wound: No Burn, Neuropathy Photos Photo Uploaded By: Regan Lemming on 06/13/2015 16:05:54 Wound Measurements Length: (cm) 1 Width: (cm) 1.2 Depth: (cm) 0.2 Area: (cm) 0.942 Volume: (cm) 0.188 % Reduction in Area: -344.3% % Reduction in Volume: -795.2% Epithelialization: None Tunneling: No Undermining: No Wound Description Classification: Partial Thickness Wound Margin: Distinct, outline attached Exudate Amount: None Present Foul Odor After Cleansing: No Wound Bed Granulation Amount: None Present (0%) Exposed Structure Necrotic Amount: Large (67-100%) Fascia Exposed: No Necrotic Quality: Eschar Fat Layer Exposed: No Tendon Exposed: No Muscle Exposed: No Joint Exposed: No [name], Tonya R. ([id number]) Bone Exposed: No Limited to Skin Breakdown Periwound Skin Texture Texture Color No Abnormalities Noted: No No Abnormalities Noted: No Callus: No Atrophie Blanche: No Crepitus: No Cyanosis: No Excoriation: No Ecchymosis: No Fluctuance: No Erythema: No Friable:  No Hemosiderin Staining: No Induration: No Mottled: No Localized Edema: No Pallor: No Rash: No Rubor: No Scarring: No Temperature / Pain Moisture Temperature: No Abnormality No Abnormalities Noted: No Dry / Scaly: Yes Maceration: No Moist: No Wound Preparation Ulcer Cleansing: Rinsed/Irrigated with Saline Topical Anesthetic Applied: None Treatment Notes Wound #16 (Left Hand - 3rd Digit) 1. Cleansed with: Clean wound with Normal Saline 4. Dressing Applied: Hydrogel Other dressing (specify in notes) 5. Secondary Dressing Applied Bordered Foam Dressing Notes betadine paint, hydrogel to sacrum and BFD to sacrum only Electronic Signature(s) Signed: 06/13/2015 4:15:02 PM By: Regan Lemming BSN, RN Entered By: Regan Lemming on 06/13/2015 11:41:41 [name] ([id number]) -------------------------------------------------------------------------------- Wound Assessment Details Patient Name: [name] Date of Service: 06/13/2015 11:30 AM Medical Record Number: [id number] Patient Account Number: [id number] Date of Birth/Sex: 18-Nov-1965 (49 y.o. Female) Treating RN: Afful, RN, BSN, [place] Primary Care Physician: Arnette Norris Other Clinician: Referring Physician: Arnette Norris Treating Physician/Extender: Frann Rider in Treatment: 7 Wound Status Wound Number: 17 Primary Trauma, Other Etiology: Wound Location: Left Hand - 4th Digit Wound Open Wounding Event: Trauma Status: Date Acquired: 12/04/2014 Comorbid Cataracts, Anemia, Type II Diabetes, Weeks Of Treatment: 7 History: End Stage Renal Disease, History of Clustered Wound: No Burn, Neuropathy Photos Photo Uploaded By: Regan Lemming on 06/13/2015 16:05:54 Wound Measurements Length: (cm) 0.8 Width: (cm) 1 Depth: (cm) 0.1 Area: (cm) 0.628 Volume: (cm) 0.063 % Reduction in Area: 0% % Reduction in Volume: 0% Epithelialization: None Tunneling: No Undermining: No Wound Description Classification:  Partial Thickness Wound Margin: Distinct, outline attached Exudate Amount: None Present Foul Odor After Cleansing: No Wound Bed Granulation Amount: None Present (0%) Exposed Structure Necrotic Amount: Large (67-100%) Fascia Exposed: No Necrotic Quality: Eschar Fat Layer Exposed: No Tendon Exposed: No Muscle Exposed: No Joint Exposed: No [name], Tonya R. ([id number]) Bone Exposed: No Limited to Skin Breakdown Periwound Skin Texture Texture Color No Abnormalities Noted: No No Abnormalities Noted: No Callus: No Atrophie Blanche:  11:30 AM Medical Record Number: [id number] Patient Account Number: [id number] Date of Birth/Sex: [birth year] (49 y.o. Female) Treating RN: Afful, RN, BSN, [place] Primary Care Physician: Arnette Norris Other Clinician: Referring Physician: Arnette Norris Treating Physician/Extender: Frann Rider in Treatment: 7 Wound Status Wound Number: 12 Primary Trauma, Other Etiology: Wound Location: Right Hand - 1st Digit Wound Open Wounding Event: Trauma Status: Date Acquired: 12/04/2014 Comorbid Cataracts, Anemia, Type II Diabetes, Weeks Of Treatment: 7 History: End Stage Renal Disease, History of Clustered Wound: No Burn, Neuropathy Photos Photo Uploaded By: Regan Lemming on 06/13/2015 16:03:43 Wound Measurements Length: (cm) 1 Width: (cm) 1.5 Depth: (cm) 0.1 Area: (cm) 1.178 Volume: (cm) 0.118 % Reduction in Area: 3.8% % Reduction in Volume: 4.1% Epithelialization: None Tunneling: No Undermining: No Wound Description Classification: Partial Thickness Wound Margin: Flat and Intact Exudate Amount: None Present Foul Odor After Cleansing: No Wound  Bed Granulation Amount: Medium (34-66%) Exposed Structure Granulation Quality: Red Fascia Exposed: No Necrotic Amount: Medium (34-66%) Fat Layer Exposed: No Necrotic Quality: Eschar Tendon Exposed: No Muscle Exposed: No Joint Exposed: No [name], Tonya R. ([id number]) Bone Exposed: No Limited to Skin Breakdown Periwound Skin Texture Texture Color No Abnormalities Noted: No No Abnormalities Noted: No Callus: No Atrophie Blanche: No Crepitus: No Cyanosis: No Excoriation: No Ecchymosis: No Fluctuance: No Erythema: No Friable: No Hemosiderin Staining: No Induration: No Mottled: No Localized Edema: No Pallor: No Rash: No Rubor: No Scarring: No Temperature / Pain Moisture Temperature: No Abnormality No Abnormalities Noted: No Dry / Scaly: Yes Maceration: No Moist: No Wound Preparation Ulcer Cleansing: Rinsed/Irrigated with Saline Topical Anesthetic Applied: None Treatment Notes Wound #12 (Right Hand - 1st Digit) 1. Cleansed with: Clean wound with Normal Saline 4. Dressing Applied: Hydrogel Other dressing (specify in notes) 5. Secondary Dressing Applied Bordered Foam Dressing Notes betadine paint, hydrogel to sacrum and BFD to sacrum only Electronic Signature(s) Signed: 06/13/2015 4:15:02 PM By: Regan Lemming BSN, RN Entered By: Regan Lemming on 06/13/2015 11:40:09 [name] ([id number]) -------------------------------------------------------------------------------- Wound Assessment Details Patient Name: [name] Date of Service: 06/13/2015 11:30 AM Medical Record Number: [id number] Patient Account Number: [id number] Date of Birth/Sex: 03-Apr-1966 (49 y.o. Female) Treating RN: Afful, RN, BSN, [place] Primary Care Physician: Arnette Norris Other Clinician: Referring Physician: Arnette Norris Treating Physician/Extender: Frann Rider in Treatment: 7 Wound Status Wound Number: 13 Primary Etiology: Trauma, Other Wound Location: Right Hand -  2nd Digit Wound Status: Healed - Epithelialized Wounding Event: Trauma Date Acquired: 12/04/2014 Weeks Of Treatment: 7 Clustered Wound: No Photos Photo Uploaded By: Regan Lemming on 06/13/2015 16:03:43 Wound Measurements Length: (cm) 0 Width: (cm) 0 Depth: (cm) 0 Area: (cm) 0 Volume: (cm) 0 % Reduction in Area: 100% % Reduction in Volume: 100% Wound Description Classification: Partial Thickness Periwound Skin Texture Texture Color No Abnormalities Noted: No No Abnormalities Noted: No Moisture No Abnormalities Noted: No Electronic Signature(s) Signed: 06/13/2015 4:15:02 PM By: Regan Lemming BSN, RN [name], Tonya Sewer ([id number]) Entered By: Regan Lemming on 06/13/2015 11:39:27 [name] ([id number]) -------------------------------------------------------------------------------- Wound Assessment Details Patient Name: [name] Date of Service: 06/13/2015 11:30 AM Medical Record Number: [id number] Patient Account Number: [id number] Date of Birth/Sex: 23-May-1966 (49 y.o. Female) Treating RN: Baruch Gouty, RN, BSN, [place] Primary Care Physician: Arnette Norris Other Clinician: Referring Physician: Arnette Norris Treating Physician/Extender: Frann Rider in Treatment: 7 Wound Status Wound Number: 14 Primary Trauma, Other Etiology: Wound Location: Left Hand - 1st Digit Wound Open Wounding Event: Trauma Status: Date Acquired: 12/04/2014 Comorbid Cataracts, Anemia,  11:30 AM Medical Record Number: [id number] Patient Account Number: [id number] Date of Birth/Sex: [birth year] (49 y.o. Female) Treating RN: Afful, RN, BSN, [place] Primary Care Physician: Arnette Norris Other Clinician: Referring Physician: Arnette Norris Treating Physician/Extender: Frann Rider in Treatment: 7 Wound Status Wound Number: 12 Primary Trauma, Other Etiology: Wound Location: Right Hand - 1st Digit Wound Open Wounding Event: Trauma Status: Date Acquired: 12/04/2014 Comorbid Cataracts, Anemia, Type II Diabetes, Weeks Of Treatment: 7 History: End Stage Renal Disease, History of Clustered Wound: No Burn, Neuropathy Photos Photo Uploaded By: Regan Lemming on 06/13/2015 16:03:43 Wound Measurements Length: (cm) 1 Width: (cm) 1.5 Depth: (cm) 0.1 Area: (cm) 1.178 Volume: (cm) 0.118 % Reduction in Area: 3.8% % Reduction in Volume: 4.1% Epithelialization: None Tunneling: No Undermining: No Wound Description Classification: Partial Thickness Wound Margin: Flat and Intact Exudate Amount: None Present Foul Odor After Cleansing: No Wound  Bed Granulation Amount: Medium (34-66%) Exposed Structure Granulation Quality: Red Fascia Exposed: No Necrotic Amount: Medium (34-66%) Fat Layer Exposed: No Necrotic Quality: Eschar Tendon Exposed: No Muscle Exposed: No Joint Exposed: No [name], Tonya R. ([id number]) Bone Exposed: No Limited to Skin Breakdown Periwound Skin Texture Texture Color No Abnormalities Noted: No No Abnormalities Noted: No Callus: No Atrophie Blanche: No Crepitus: No Cyanosis: No Excoriation: No Ecchymosis: No Fluctuance: No Erythema: No Friable: No Hemosiderin Staining: No Induration: No Mottled: No Localized Edema: No Pallor: No Rash: No Rubor: No Scarring: No Temperature / Pain Moisture Temperature: No Abnormality No Abnormalities Noted: No Dry / Scaly: Yes Maceration: No Moist: No Wound Preparation Ulcer Cleansing: Rinsed/Irrigated with Saline Topical Anesthetic Applied: None Treatment Notes Wound #12 (Right Hand - 1st Digit) 1. Cleansed with: Clean wound with Normal Saline 4. Dressing Applied: Hydrogel Other dressing (specify in notes) 5. Secondary Dressing Applied Bordered Foam Dressing Notes betadine paint, hydrogel to sacrum and BFD to sacrum only Electronic Signature(s) Signed: 06/13/2015 4:15:02 PM By: Regan Lemming BSN, RN Entered By: Regan Lemming on 06/13/2015 11:40:09 [name] ([id number]) -------------------------------------------------------------------------------- Wound Assessment Details Patient Name: [name] Date of Service: 06/13/2015 11:30 AM Medical Record Number: [id number] Patient Account Number: [id number] Date of Birth/Sex: 03-Apr-1966 (49 y.o. Female) Treating RN: Afful, RN, BSN, [place] Primary Care Physician: Arnette Norris Other Clinician: Referring Physician: Arnette Norris Treating Physician/Extender: Frann Rider in Treatment: 7 Wound Status Wound Number: 13 Primary Etiology: Trauma, Other Wound Location: Right Hand -  2nd Digit Wound Status: Healed - Epithelialized Wounding Event: Trauma Date Acquired: 12/04/2014 Weeks Of Treatment: 7 Clustered Wound: No Photos Photo Uploaded By: Regan Lemming on 06/13/2015 16:03:43 Wound Measurements Length: (cm) 0 Width: (cm) 0 Depth: (cm) 0 Area: (cm) 0 Volume: (cm) 0 % Reduction in Area: 100% % Reduction in Volume: 100% Wound Description Classification: Partial Thickness Periwound Skin Texture Texture Color No Abnormalities Noted: No No Abnormalities Noted: No Moisture No Abnormalities Noted: No Electronic Signature(s) Signed: 06/13/2015 4:15:02 PM By: Regan Lemming BSN, RN [name], Tonya Sewer ([id number]) Entered By: Regan Lemming on 06/13/2015 11:39:27 [name] ([id number]) -------------------------------------------------------------------------------- Wound Assessment Details Patient Name: [name] Date of Service: 06/13/2015 11:30 AM Medical Record Number: [id number] Patient Account Number: [id number] Date of Birth/Sex: 23-May-1966 (49 y.o. Female) Treating RN: Baruch Gouty, RN, BSN, [place] Primary Care Physician: Arnette Norris Other Clinician: Referring Physician: Arnette Norris Treating Physician/Extender: Frann Rider in Treatment: 7 Wound Status Wound Number: 14 Primary Trauma, Other Etiology: Wound Location: Left Hand - 1st Digit Wound Open Wounding Event: Trauma Status: Date Acquired: 12/04/2014 Comorbid Cataracts, Anemia,  Tonya, [name] ([id number]) Visit Report for 06/13/2015 Arrival Information Details Patient Name: Tonya, [name] Date of Service: 06/13/2015 11:30 AM Medical Record Number: [id number] Patient Account Number: [id number] Date of Birth/Sex: [birth year] (49 y.o. Female) Treating RN: Baruch Gouty, RN, BSN, [place] Primary Care Physician: Arnette Norris Other Clinician: Referring Physician: Arnette Norris Treating Physician/Extender: Frann Rider in Treatment: 7 Visit Information History Since Last Visit Added or deleted any medications: No Patient Arrived: Wheel Chair Any new allergies or adverse reactions: No Arrival Time: 11:34 Hospitalized since last visit: No Accompanied By: husband Has Dressing in Place as Prescribed: Yes Transfer Assistance: None Pain Present Now: No Patient Identification Verified: Yes Secondary Verification Process Yes Completed: Patient Has Alerts: Yes Patient Alerts: DMII Electronic Signature(s) Signed: 06/13/2015 4:15:02 PM By: Regan Lemming BSN, RN Entered By: Regan Lemming on 06/13/2015 11:34:35 [name] ([id number]) -------------------------------------------------------------------------------- Encounter Discharge Information Details Patient Name: [name] Date of Service: 06/13/2015 11:30 AM Medical Record Number: [id number] Patient Account Number: [id number] Date of Birth/Sex: 11/16/1965 (49 y.o. Female) Treating RN: Baruch Gouty, RN, BSN, [place] Primary Care Physician: Arnette Norris Other Clinician: Referring Physician: Arnette Norris Treating Physician/Extender: Frann Rider in Treatment: 7 Encounter Discharge Information Items Discharge Pain Level: 0 Discharge Condition: Stable Ambulatory Status: Wheelchair Discharge Destination: Home Private Transportation: Auto Accompanied By: husband Schedule Follow-up Appointment: No Medication Reconciliation completed and No provided to Patient/Care Alliene Klugh: Clinical Summary of  Care: Electronic Signature(s) Signed: 06/13/2015 12:14:20 PM By: Regan Lemming BSN, RN Entered By: Regan Lemming on 06/13/2015 12:14:20 [name] ([id number]) -------------------------------------------------------------------------------- Lower Extremity Assessment Details Patient Name: [name] Date of Service: 06/13/2015 11:30 AM Medical Record Number: [id number] Patient Account Number: [id number] Date of Birth/Sex: [birth year] (49 y.o. Female) Treating RN: Afful, RN, BSN, [place] Primary Care Physician: Arnette Norris Other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

## 2015-06-14 ENCOUNTER — Telehealth: Payer: Self-pay | Admitting: Infectious Disease

## 2015-06-14 ENCOUNTER — Telehealth: Payer: Self-pay | Admitting: *Deleted

## 2015-06-14 NOTE — Telephone Encounter (Signed)
[facility] [phone] request copy of the wound culture results with yeast.  Left message informing Tamika, the results could be pulled up in EPIC or I could fax if she would call the fax number to me.  I faxed to the [facility] number on their referral form, in case Tamika missed my call.  Later faxed to [phone].

## 2015-06-14 NOTE — Telephone Encounter (Signed)
Left message for the nurse Mateo Flow at [facility] in [place] to fax culture resluts that may have shown yeast present. Dr. Tommy Medal would like these sent attention to him.

## 2015-06-17 ENCOUNTER — Telehealth: Payer: Self-pay | Admitting: *Deleted

## 2015-06-17 NOTE — Telephone Encounter (Signed)
Patient returned call and advised he of MRI appt. Patient states she can make the appt.

## 2015-06-17 NOTE — Telephone Encounter (Signed)
MRI Foot scheduled for 10/27 at 5:00 (4:45 arrival) at [facility].  Prior authorization received # [id number].  RN left message for patient with scheduling information and phone number to reschedule if she needs [phone]). Landis Gandy, RN

## 2015-06-26 NOTE — Telephone Encounter (Signed)
The issue is I dont know if it is candida (which it probably is. Bigger issue is when and if surgery of any kind planned? And when is her return appt?

## 2015-06-26 NOTE — Telephone Encounter (Signed)
Yeast Culture results received.  RN shared results with Dr. Tommy Medal this AM.  Culture results from 05/11/15.  Dr. Tommy Medal shared that the sample was old and has probably been destroyed.

## 2015-06-27 ENCOUNTER — Inpatient Hospital Stay (HOSPITAL_COMMUNITY): Payer: Medicare Other | Admitting: Anesthesiology

## 2015-06-27 ENCOUNTER — Other Ambulatory Visit: Payer: Self-pay

## 2015-06-27 ENCOUNTER — Encounter: Payer: Medicare Other | Admitting: Surgery

## 2015-06-27 ENCOUNTER — Ambulatory Visit (HOSPITAL_COMMUNITY): Admission: RE | Admit: 2015-06-27 | Payer: Medicare Other | Source: Ambulatory Visit

## 2015-06-27 ENCOUNTER — Encounter (HOSPITAL_COMMUNITY): Payer: Self-pay | Admitting: *Deleted

## 2015-06-27 ENCOUNTER — Encounter (HOSPITAL_COMMUNITY)
Admission: EM | Disposition: A | Discharge status: Home / Self Care | Payer: Self-pay | Source: Home / Self Care | Attending: Internal Medicine

## 2015-06-27 ENCOUNTER — Emergency Department (HOSPITAL_COMMUNITY): Payer: Medicare Other

## 2015-06-27 ENCOUNTER — Inpatient Hospital Stay (HOSPITAL_COMMUNITY)
Admission: EM | Admit: 2015-06-27 | Discharge: 2015-07-03 | DRG: 853 | Disposition: A | Discharge status: Home / Self Care | Payer: Medicare Other | Attending: Internal Medicine | Admitting: Internal Medicine

## 2015-06-27 DIAGNOSIS — Z89411 Acquired absence of right great toe: Secondary | ICD-10-CM

## 2015-06-27 DIAGNOSIS — R0603 Acute respiratory distress: Secondary | ICD-10-CM

## 2015-06-27 DIAGNOSIS — Z993 Dependence on wheelchair: Secondary | ICD-10-CM

## 2015-06-27 DIAGNOSIS — Z992 Dependence on renal dialysis: Secondary | ICD-10-CM

## 2015-06-27 DIAGNOSIS — M898X9 Other specified disorders of bone, unspecified site: Secondary | ICD-10-CM | POA: Diagnosis present

## 2015-06-27 DIAGNOSIS — Z792 Long term (current) use of antibiotics: Secondary | ICD-10-CM | POA: Diagnosis not present

## 2015-06-27 DIAGNOSIS — Z8614 Personal history of Methicillin resistant Staphylococcus aureus infection: Secondary | ICD-10-CM

## 2015-06-27 DIAGNOSIS — Z89029 Acquired absence of unspecified finger(s): Secondary | ICD-10-CM | POA: Diagnosis not present

## 2015-06-27 DIAGNOSIS — Z86718 Personal history of other venous thrombosis and embolism: Secondary | ICD-10-CM | POA: Diagnosis not present

## 2015-06-27 DIAGNOSIS — N186 End stage renal disease: Secondary | ICD-10-CM

## 2015-06-27 DIAGNOSIS — E119 Type 2 diabetes mellitus without complications: Secondary | ICD-10-CM

## 2015-06-27 DIAGNOSIS — T360X5A Adverse effect of penicillins, initial encounter: Secondary | ICD-10-CM | POA: Diagnosis present

## 2015-06-27 DIAGNOSIS — Z7982 Long term (current) use of aspirin: Secondary | ICD-10-CM

## 2015-06-27 DIAGNOSIS — Z915 Personal history of self-harm: Secondary | ICD-10-CM

## 2015-06-27 DIAGNOSIS — A419 Sepsis, unspecified organism: Secondary | ICD-10-CM | POA: Diagnosis present

## 2015-06-27 DIAGNOSIS — Z6837 Body mass index (BMI) 37.0-37.9, adult: Secondary | ICD-10-CM | POA: Diagnosis not present

## 2015-06-27 DIAGNOSIS — T23009A Burn of unspecified degree of unspecified hand, unspecified site, initial encounter: Secondary | ICD-10-CM | POA: Diagnosis present

## 2015-06-27 DIAGNOSIS — Z899 Acquired absence of limb, unspecified: Secondary | ICD-10-CM

## 2015-06-27 DIAGNOSIS — E1152 Type 2 diabetes mellitus with diabetic peripheral angiopathy with gangrene: Secondary | ICD-10-CM | POA: Diagnosis present

## 2015-06-27 DIAGNOSIS — R0602 Shortness of breath: Secondary | ICD-10-CM | POA: Diagnosis present

## 2015-06-27 DIAGNOSIS — N2581 Secondary hyperparathyroidism of renal origin: Secondary | ICD-10-CM | POA: Diagnosis present

## 2015-06-27 DIAGNOSIS — L97509 Non-pressure chronic ulcer of other part of unspecified foot with unspecified severity: Secondary | ICD-10-CM | POA: Diagnosis present

## 2015-06-27 DIAGNOSIS — K219 Gastro-esophageal reflux disease without esophagitis: Secondary | ICD-10-CM | POA: Diagnosis present

## 2015-06-27 DIAGNOSIS — Z89421 Acquired absence of other right toe(s): Secondary | ICD-10-CM

## 2015-06-27 DIAGNOSIS — T23002D Burn of unspecified degree of left hand, unspecified site, subsequent encounter: Secondary | ICD-10-CM | POA: Diagnosis not present

## 2015-06-27 DIAGNOSIS — E11621 Type 2 diabetes mellitus with foot ulcer: Secondary | ICD-10-CM | POA: Diagnosis present

## 2015-06-27 DIAGNOSIS — Z794 Long term (current) use of insulin: Secondary | ICD-10-CM | POA: Diagnosis not present

## 2015-06-27 DIAGNOSIS — M86141 Other acute osteomyelitis, right hand: Secondary | ICD-10-CM | POA: Diagnosis not present

## 2015-06-27 DIAGNOSIS — F419 Anxiety disorder, unspecified: Secondary | ICD-10-CM | POA: Diagnosis present

## 2015-06-27 DIAGNOSIS — I9589 Other hypotension: Secondary | ICD-10-CM | POA: Diagnosis present

## 2015-06-27 DIAGNOSIS — Z8249 Family history of ischemic heart disease and other diseases of the circulatory system: Secondary | ICD-10-CM | POA: Diagnosis not present

## 2015-06-27 DIAGNOSIS — E0841 Diabetes mellitus due to underlying condition with diabetic mononeuropathy: Secondary | ICD-10-CM | POA: Diagnosis not present

## 2015-06-27 DIAGNOSIS — B9689 Other specified bacterial agents as the cause of diseases classified elsewhere: Secondary | ICD-10-CM | POA: Diagnosis not present

## 2015-06-27 DIAGNOSIS — Z833 Family history of diabetes mellitus: Secondary | ICD-10-CM

## 2015-06-27 DIAGNOSIS — M868X4 Other osteomyelitis, hand: Secondary | ICD-10-CM | POA: Diagnosis present

## 2015-06-27 DIAGNOSIS — R197 Diarrhea, unspecified: Secondary | ICD-10-CM | POA: Diagnosis not present

## 2015-06-27 DIAGNOSIS — L089 Local infection of the skin and subcutaneous tissue, unspecified: Secondary | ICD-10-CM

## 2015-06-27 DIAGNOSIS — D649 Anemia, unspecified: Secondary | ICD-10-CM | POA: Diagnosis present

## 2015-06-27 DIAGNOSIS — E1122 Type 2 diabetes mellitus with diabetic chronic kidney disease: Secondary | ICD-10-CM | POA: Diagnosis present

## 2015-06-27 DIAGNOSIS — L89899 Pressure ulcer of other site, unspecified stage: Secondary | ICD-10-CM | POA: Diagnosis present

## 2015-06-27 DIAGNOSIS — L8990 Pressure ulcer of unspecified site, unspecified stage: Secondary | ICD-10-CM | POA: Diagnosis not present

## 2015-06-27 DIAGNOSIS — L89310 Pressure ulcer of right buttock, unstageable: Secondary | ICD-10-CM | POA: Diagnosis not present

## 2015-06-27 DIAGNOSIS — L89312 Pressure ulcer of right buttock, stage 2: Secondary | ICD-10-CM | POA: Diagnosis present

## 2015-06-27 DIAGNOSIS — Z89021 Acquired absence of right finger(s): Secondary | ICD-10-CM | POA: Diagnosis not present

## 2015-06-27 DIAGNOSIS — E1140 Type 2 diabetes mellitus with diabetic neuropathy, unspecified: Secondary | ICD-10-CM | POA: Diagnosis present

## 2015-06-27 DIAGNOSIS — E0840 Diabetes mellitus due to underlying condition with diabetic neuropathy, unspecified: Secondary | ICD-10-CM | POA: Diagnosis present

## 2015-06-27 DIAGNOSIS — L97519 Non-pressure chronic ulcer of other part of right foot with unspecified severity: Secondary | ICD-10-CM

## 2015-06-27 DIAGNOSIS — R0600 Dyspnea, unspecified: Secondary | ICD-10-CM | POA: Diagnosis present

## 2015-06-27 DIAGNOSIS — E113599 Type 2 diabetes mellitus with proliferative diabetic retinopathy without macular edema, unspecified eye: Secondary | ICD-10-CM

## 2015-06-27 HISTORY — PX: INCISION AND DRAINAGE OF WOUND: SHX1803

## 2015-06-27 LAB — I-STAT ARTERIAL BLOOD GAS, ED
ACID-BASE EXCESS: 3 mmol/L — AB (ref 0.0–2.0)
BICARBONATE: 27.8 meq/L — AB (ref 20.0–24.0)
O2 Saturation: 100 %
PH ART: 7.42 (ref 7.350–7.450)
PO2 ART: 244 mmHg — AB (ref 80.0–100.0)
TCO2: 29 mmol/L (ref 0–100)
pCO2 arterial: 43.3 mmHg (ref 35.0–45.0)

## 2015-06-27 LAB — CBC WITH DIFFERENTIAL/PLATELET
BASOS PCT: 0 %
Basophils Absolute: 0 10*3/uL (ref 0.0–0.1)
EOS ABS: 0.1 10*3/uL (ref 0.0–0.7)
Eosinophils Relative: 1 %
HCT: 43.6 % (ref 36.0–46.0)
HEMOGLOBIN: 12.8 g/dL (ref 12.0–15.0)
Lymphocytes Relative: 8 %
Lymphs Abs: 0.8 10*3/uL (ref 0.7–4.0)
MCH: 27.6 pg (ref 26.0–34.0)
MCHC: 29.4 g/dL — AB (ref 30.0–36.0)
MCV: 94.2 fL (ref 78.0–100.0)
Monocytes Absolute: 0.8 10*3/uL (ref 0.1–1.0)
Monocytes Relative: 8 %
NEUTROS PCT: 83 %
Neutro Abs: 8.4 10*3/uL — ABNORMAL HIGH (ref 1.7–7.7)
Platelets: 204 10*3/uL (ref 150–400)
RBC: 4.63 MIL/uL (ref 3.87–5.11)
RDW: 18.1 % — ABNORMAL HIGH (ref 11.5–15.5)
WBC: 10.2 10*3/uL (ref 4.0–10.5)

## 2015-06-27 LAB — COMPREHENSIVE METABOLIC PANEL
ALK PHOS: 284 U/L — AB (ref 38–126)
ALT: 20 U/L (ref 14–54)
AST: 25 U/L (ref 15–41)
Albumin: 3.1 g/dL — ABNORMAL LOW (ref 3.5–5.0)
Anion gap: 21 — ABNORMAL HIGH (ref 5–15)
BUN: 36 mg/dL — ABNORMAL HIGH (ref 6–20)
CALCIUM: 9.1 mg/dL (ref 8.9–10.3)
CO2: 27 mmol/L (ref 22–32)
CREATININE: 5.71 mg/dL — AB (ref 0.44–1.00)
Chloride: 89 mmol/L — ABNORMAL LOW (ref 101–111)
GFR, EST AFRICAN AMERICAN: 9 mL/min — AB (ref >60)
GFR, EST NON AFRICAN AMERICAN: 8 mL/min — AB (ref >60)
Glucose, Bld: 286 mg/dL — ABNORMAL HIGH (ref 65–99)
Potassium: 4.6 mmol/L (ref 3.5–5.1)
Sodium: 137 mmol/L (ref 135–145)
Total Bilirubin: 0.9 mg/dL (ref 0.3–1.2)
Total Protein: 6.9 g/dL (ref 6.5–8.1)

## 2015-06-27 LAB — I-STAT TROPONIN, ED: TROPONIN I, POC: 0.02 ng/mL (ref 0.00–0.08)

## 2015-06-27 LAB — PROTIME-INR
INR: 1.31 (ref 0.00–1.49)
PROTHROMBIN TIME: 16.5 s — AB (ref 11.6–15.2)

## 2015-06-27 LAB — SURGICAL PCR SCREEN
MRSA, PCR: NEGATIVE
STAPHYLOCOCCUS AUREUS: NEGATIVE

## 2015-06-27 LAB — PROCALCITONIN: PROCALCITONIN: 2 ng/mL

## 2015-06-27 LAB — I-STAT CG4 LACTIC ACID, ED
LACTIC ACID, VENOUS: 5.02 mmol/L — AB (ref 0.5–2.0)
Lactic Acid, Venous: 4.61 mmol/L (ref 0.5–2.0)

## 2015-06-27 LAB — GLUCOSE, CAPILLARY: Glucose-Capillary: 158 mg/dL — ABNORMAL HIGH (ref 65–99)

## 2015-06-27 LAB — LACTIC ACID, PLASMA
LACTIC ACID, VENOUS: 2.6 mmol/L — AB (ref 0.5–2.0)
Lactic Acid, Venous: 2.3 mmol/L (ref 0.5–2.0)

## 2015-06-27 LAB — APTT: aPTT: 35 seconds (ref 24–37)

## 2015-06-27 LAB — TROPONIN I

## 2015-06-27 SURGERY — IRRIGATION AND DEBRIDEMENT WOUND
Anesthesia: General | Site: Finger | Laterality: Left

## 2015-06-27 SURGERY — Surgical Case
Anesthesia: *Unknown

## 2015-06-27 MED ORDER — ASPIRIN 81 MG PO CHEW
324.0000 mg | CHEWABLE_TABLET | Freq: Once | ORAL | Status: AC
  Administered 2015-06-27: 324 mg via ORAL
  Filled 2015-06-27: qty 4

## 2015-06-27 MED ORDER — IBUPROFEN 200 MG PO TABS
600.0000 mg | ORAL_TABLET | Freq: Once | ORAL | Status: AC
Start: 2015-06-27 — End: 2015-06-27
  Administered 2015-06-27: 600 mg via ORAL
  Filled 2015-06-27: qty 3

## 2015-06-27 MED ORDER — INSULIN ASPART 100 UNIT/ML ~~LOC~~ SOLN
20.0000 [IU] | Freq: Three times a day (TID) | SUBCUTANEOUS | Status: DC
  Administered 2015-06-28 – 2015-07-03 (×9): 20 [IU] via SUBCUTANEOUS

## 2015-06-27 MED ORDER — PIPERACILLIN-TAZOBACTAM 3.375 G IVPB 30 MIN
3.3750 g | Freq: Once | INTRAVENOUS | Status: AC
Start: 2015-06-27 — End: 2015-06-27
  Administered 2015-06-27: 3.375 g via INTRAVENOUS
  Filled 2015-06-27: qty 50

## 2015-06-27 MED ORDER — RENA-VITE PO TABS
1.0000 | ORAL_TABLET | Freq: Every day | ORAL | Status: DC
  Administered 2015-06-27 – 2015-07-02 (×6): 1 via ORAL
  Filled 2015-06-27 (×6): qty 1

## 2015-06-27 MED ORDER — SEVELAMER CARBONATE 800 MG PO TABS
4000.0000 mg | ORAL_TABLET | Freq: Three times a day (TID) | ORAL | Status: DC
  Administered 2015-06-28 – 2015-07-03 (×10): 4000 mg via ORAL
  Filled 2015-06-27 (×13): qty 5

## 2015-06-27 MED ORDER — SODIUM CHLORIDE 0.9 % IV SOLN
INTRAVENOUS | Status: DC | PRN
  Administered 2015-06-27: 20:00:00 via INTRAVENOUS

## 2015-06-27 MED ORDER — MIDODRINE HCL 5 MG PO TABS: 20.0000 mg | ORAL_TABLET | Freq: Three times a day (TID) | ORAL | Status: DC

## 2015-06-27 MED ORDER — LIDOCAINE-EPINEPHRINE (PF) 1.5 %-1:200000 IJ SOLN
INTRAMUSCULAR | Status: DC | PRN
  Administered 2015-06-27: 20 mL via PERINEURAL

## 2015-06-27 MED ORDER — PANTOPRAZOLE SODIUM 40 MG PO TBEC
40.0000 mg | DELAYED_RELEASE_TABLET | Freq: Every day | ORAL | Status: DC
  Administered 2015-06-28 – 2015-07-03 (×6): 40 mg via ORAL
  Filled 2015-06-27 (×6): qty 1

## 2015-06-27 MED ORDER — METRONIDAZOLE IN NACL 5-0.79 MG/ML-% IV SOLN
500.0000 mg | Freq: Once | INTRAVENOUS | Status: AC
  Administered 2015-06-27: 500 mg via INTRAVENOUS
  Filled 2015-06-27: qty 100

## 2015-06-27 MED ORDER — METRONIDAZOLE IN NACL 5-0.79 MG/ML-% IV SOLN
500.0000 mg | Freq: Three times a day (TID) | INTRAVENOUS | Status: DC
  Administered 2015-06-27 – 2015-07-02 (×14): 500 mg via INTRAVENOUS
  Filled 2015-06-27 (×15): qty 100

## 2015-06-27 MED ORDER — AZTREONAM 2 G IJ SOLR: 2.0000 g | Freq: Once | INTRAMUSCULAR | Status: DC

## 2015-06-27 MED ORDER — ASPIRIN EC 81 MG PO TBEC
81.0000 mg | DELAYED_RELEASE_TABLET | Freq: Every day | ORAL | Status: DC
  Administered 2015-06-27 – 2015-07-02 (×6): 81 mg via ORAL
  Filled 2015-06-27 (×6): qty 1

## 2015-06-27 MED ORDER — ALIGN PO CAPS: 1.0000 | ORAL_CAPSULE | Freq: Every day | ORAL | Status: DC

## 2015-06-27 MED ORDER — MIDODRINE HCL 5 MG PO TABS
20.0000 mg | ORAL_TABLET | Freq: Three times a day (TID) | ORAL | Status: DC
Start: 2015-06-27 — End: 2015-07-03
  Administered 2015-06-27 – 2015-07-03 (×19): 20 mg via ORAL
  Filled 2015-06-27 (×19): qty 4

## 2015-06-27 MED ORDER — LIDOCAINE HCL 1 % IJ SOLN
INTRAMUSCULAR | Status: DC | PRN
  Administered 2015-06-27: 2.5 mL

## 2015-06-27 MED ORDER — RISAQUAD PO CAPS
1.0000 | ORAL_CAPSULE | Freq: Every day | ORAL | Status: DC
  Administered 2015-06-27 – 2015-07-02 (×6): 1 via ORAL
  Filled 2015-06-27 (×8): qty 1

## 2015-06-27 MED ORDER — SODIUM CHLORIDE 0.9 % IR SOLN
Status: DC | PRN
  Administered 2015-06-27: 1000 mL

## 2015-06-27 MED ORDER — PROPOFOL 10 MG/ML IV BOLUS
INTRAVENOUS | Status: AC
  Filled 2015-06-27: qty 20

## 2015-06-27 MED ORDER — HEPARIN SODIUM (PORCINE) 5000 UNIT/ML IJ SOLN
5000.0000 [IU] | Freq: Three times a day (TID) | INTRAMUSCULAR | Status: DC
  Administered 2015-06-27 – 2015-07-02 (×13): 5000 [IU] via SUBCUTANEOUS
  Filled 2015-06-27 (×13): qty 1

## 2015-06-27 MED ORDER — VANCOMYCIN HCL 10 G IV SOLR
1750.0000 mg | Freq: Once | INTRAVENOUS | Status: AC
  Administered 2015-06-27: 1750 mg via INTRAVENOUS
  Filled 2015-06-27: qty 1750

## 2015-06-27 MED ORDER — BUPIVACAINE HCL (PF) 0.25 % IJ SOLN
INTRAMUSCULAR | Status: AC
  Filled 2015-06-27: qty 30

## 2015-06-27 MED ORDER — INSULIN GLARGINE 100 UNIT/ML ~~LOC~~ SOLN
50.0000 [IU] | Freq: Every day | SUBCUTANEOUS | Status: DC
  Administered 2015-06-27 – 2015-07-01 (×5): 50 [IU] via SUBCUTANEOUS
  Administered 2015-07-02: 25 [IU] via SUBCUTANEOUS
  Filled 2015-06-27 (×7): qty 0.5

## 2015-06-27 MED ORDER — PIPERACILLIN-TAZOBACTAM IN DEX 2-0.25 GM/50ML IV SOLN
2.2500 g | Freq: Three times a day (TID) | INTRAVENOUS | Status: DC
  Filled 2015-06-27 (×2): qty 50

## 2015-06-27 MED ORDER — MIDAZOLAM HCL 2 MG/2ML IJ SOLN
INTRAMUSCULAR | Status: AC
  Filled 2015-06-27: qty 4

## 2015-06-27 MED ORDER — VANCOMYCIN HCL IN DEXTROSE 1-5 GM/200ML-% IV SOLN: 1000.0000 mg | Freq: Once | INTRAVENOUS | Status: DC

## 2015-06-27 MED ORDER — LOPERAMIDE HCL 2 MG PO CAPS
2.0000 mg | ORAL_CAPSULE | ORAL | Status: DC | PRN
  Administered 2015-06-28 (×2): 2 mg via ORAL
  Filled 2015-06-27 (×2): qty 1

## 2015-06-27 MED ORDER — BUPIVACAINE HCL (PF) 0.25 % IJ SOLN
INTRAMUSCULAR | Status: DC | PRN
  Administered 2015-06-27: 2.5 mL

## 2015-06-27 MED ORDER — DEXTROSE 5 % IV SOLN
1.0000 g | INTRAVENOUS | Status: DC
  Administered 2015-06-27 – 2015-07-02 (×5): 1 g via INTRAVENOUS
  Filled 2015-06-27 (×8): qty 1

## 2015-06-27 MED ORDER — LIDOCAINE HCL (PF) 1 % IJ SOLN
INTRAMUSCULAR | Status: AC
  Filled 2015-06-27: qty 30

## 2015-06-27 MED ORDER — SODIUM CHLORIDE 0.9 % IV BOLUS (SEPSIS)
500.0000 mL | Freq: Once | INTRAVENOUS | Status: AC
  Administered 2015-06-27: 500 mL via INTRAVENOUS

## 2015-06-27 MED ORDER — VANCOMYCIN HCL IN DEXTROSE 1-5 GM/200ML-% IV SOLN
1000.0000 mg | INTRAVENOUS | Status: DC
  Administered 2015-06-28 – 2015-07-03 (×3): 1000 mg via INTRAVENOUS
  Filled 2015-06-27 (×6): qty 200

## 2015-06-27 MED ORDER — GABAPENTIN 100 MG PO CAPS
100.0000 mg | ORAL_CAPSULE | Freq: Every day | ORAL | Status: DC
  Administered 2015-06-27 – 2015-07-02 (×6): 100 mg via ORAL
  Filled 2015-06-27 (×6): qty 1

## 2015-06-27 MED ORDER — SODIUM CHLORIDE 0.9 % IV BOLUS (SEPSIS): 1000.0000 mL | Freq: Once | INTRAVENOUS | Status: DC

## 2015-06-27 MED ORDER — ONDANSETRON HCL 4 MG/2ML IJ SOLN
4.0000 mg | Freq: Four times a day (QID) | INTRAMUSCULAR | Status: DC | PRN
  Administered 2015-06-28 – 2015-07-02 (×3): 4 mg via INTRAVENOUS
  Filled 2015-06-27 (×4): qty 2

## 2015-06-27 MED ORDER — GABAPENTIN 100 MG PO CAPS
200.0000 mg | ORAL_CAPSULE | Freq: Every day | ORAL | Status: DC
  Administered 2015-06-28 – 2015-07-03 (×6): 200 mg via ORAL
  Filled 2015-06-27 (×6): qty 2

## 2015-06-27 MED ORDER — CINACALCET HCL 30 MG PO TABS
60.0000 mg | ORAL_TABLET | Freq: Every day | ORAL | Status: DC
  Administered 2015-06-28 – 2015-07-03 (×6): 60 mg via ORAL
  Filled 2015-06-27 (×7): qty 2

## 2015-06-27 MED ORDER — FENTANYL CITRATE (PF) 250 MCG/5ML IJ SOLN
INTRAMUSCULAR | Status: AC
  Filled 2015-06-27: qty 5

## 2015-06-27 SURGICAL SUPPLY — 46 items
BAG DECANTER FOR FLEXI CONT (MISCELLANEOUS) ×3 IMPLANT
BANDAGE ELASTIC 3 VELCRO ST LF (GAUZE/BANDAGES/DRESSINGS) ×2 IMPLANT
BANDAGE ELASTIC 4 VELCRO ST LF (GAUZE/BANDAGES/DRESSINGS) IMPLANT
BNDG CMPR 9X4 STRL LF SNTH (GAUZE/BANDAGES/DRESSINGS) ×1
BNDG ESMARK 4X9 LF (GAUZE/BANDAGES/DRESSINGS) ×2 IMPLANT
BNDG GAUZE ELAST 4 BULKY (GAUZE/BANDAGES/DRESSINGS) ×2 IMPLANT
CORDS BIPOLAR (ELECTRODE) ×2 IMPLANT
CUFF TOURNIQUET SINGLE 18IN (TOURNIQUET CUFF) ×2 IMPLANT
DRAPE SURG 17X23 STRL (DRAPES) ×3 IMPLANT
ELECT REM PT RETURN 9FT ADLT (ELECTROSURGICAL)
ELECTRODE REM PT RTRN 9FT ADLT (ELECTROSURGICAL) IMPLANT
GAUZE PACKING IODOFORM 1/4X15 (GAUZE/BANDAGES/DRESSINGS) ×2 IMPLANT
GAUZE PACKING IODOFORM 1/4X5 (PACKING) IMPLANT
GAUZE SPONGE 4X4 12PLY STRL (GAUZE/BANDAGES/DRESSINGS) ×3 IMPLANT
GAUZE XEROFORM 1X8 LF (GAUZE/BANDAGES/DRESSINGS) ×3 IMPLANT
GLOVE BIOGEL M STRL SZ7.5 (GLOVE) ×3 IMPLANT
GOWN STRL REUS W/ TWL LRG LVL3 (GOWN DISPOSABLE) ×2 IMPLANT
GOWN STRL REUS W/TWL LRG LVL3 (GOWN DISPOSABLE) ×6
HANDPIECE INTERPULSE COAX TIP (DISPOSABLE)
KIT BASIN OR (CUSTOM PROCEDURE TRAY) ×3 IMPLANT
KIT ROOM TURNOVER OR (KITS) ×3 IMPLANT
MANIFOLD NEPTUNE II (INSTRUMENTS) ×3 IMPLANT
NDL HYPO 25GX1X1/2 BEV (NEEDLE) IMPLANT
NEEDLE HYPO 25GX1X1/2 BEV (NEEDLE) ×3 IMPLANT
NS IRRIG 1000ML POUR BTL (IV SOLUTION) ×3 IMPLANT
PACK ORTHO EXTREMITY (CUSTOM PROCEDURE TRAY) ×3 IMPLANT
PAD ARMBOARD 7.5X6 YLW CONV (MISCELLANEOUS) ×6 IMPLANT
PAD CAST 4YDX4 CTTN HI CHSV (CAST SUPPLIES) IMPLANT
PADDING CAST COTTON 4X4 STRL (CAST SUPPLIES)
SCRUB POVIDONE IODINE 4 OZ (MISCELLANEOUS) ×2 IMPLANT
SET HNDPC FAN SPRY TIP SCT (DISPOSABLE) IMPLANT
SOAP 2 % CHG 4 OZ (WOUND CARE) ×1 IMPLANT
SOL PREP POV-IOD 4OZ 10% (MISCELLANEOUS) ×2 IMPLANT
SPONGE LAP 18X18 X RAY DECT (DISPOSABLE) IMPLANT
SPONGE LAP 4X18 X RAY DECT (DISPOSABLE) ×1 IMPLANT
SUCTION FRAZIER TIP 8 FR DISP (SUCTIONS) ×2
SUCTION TUBE FRAZIER 8FR DISP (SUCTIONS) IMPLANT
SUT ETHILON 4 0 PS 2 18 (SUTURE) ×2 IMPLANT
SYR CONTROL 10ML LL (SYRINGE) ×4 IMPLANT
TOWEL OR 17X24 6PK STRL BLUE (TOWEL DISPOSABLE) ×3 IMPLANT
TOWEL OR 17X26 10 PK STRL BLUE (TOWEL DISPOSABLE) ×3 IMPLANT
TUBE ANAEROBIC SPECIMEN COL (MISCELLANEOUS) IMPLANT
TUBE CONNECTING 12'X1/4 (SUCTIONS) ×1
TUBE CONNECTING 12X1/4 (SUCTIONS) ×2 IMPLANT
WATER STERILE IRR 1000ML POUR (IV SOLUTION) ×3 IMPLANT
YANKAUER SUCT BULB TIP NO VENT (SUCTIONS) ×3 IMPLANT

## 2015-06-27 NOTE — Progress Notes (Signed)
CRITICAL VALUE ALERT  Critical value received:  Lactic Acid 2.6  Date of notification:  06/27/2015  Time of notification:  2024  Critical value read back:Yes.    Nurse who received alert:  Annice Pih  MD notified (1st page):  Chaney Malling  Time of first page:  2025  MD notified (2nd page):  Time of second page:  Responding MD:    Time MD responded:
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
## 2015-06-27 NOTE — Anesthesia Postprocedure Evaluation (Signed)
  Anesthesia Post-op Note  Patient: [name]  Procedure(s) Performed: Procedure(s) (LRB): Irrigation and Debridement Left Middle finger,  Partial Amputation of left middle finger (Left)  Patient Location: PACU  Anesthesia Type: MAC  Level of Consciousness: awake and alert   Airway and Oxygen Therapy: Patient Spontanous Breathing  Post-op Pain: mild  Post-op Assessment: Post-op Vital signs reviewed, Patient's Cardiovascular Status Stable, Respiratory Function Stable, Patent Airway and No signs of Nausea or vomiting  Last Vitals:  Filed Vitals:   06/27/15 2046  BP: 77/30  Pulse: 65  Temp: 37.4 C  Resp: 17    Post-op Vital Signs: stable   Complications: No apparent anesthesia complications

## 2015-06-27 NOTE — Anesthesia Procedure Notes (Signed)
Anesthesia Regional Block:  Digital block  Pre-Anesthetic Checklist: ,, timeout performed, Correct Patient, Correct Site, Correct Laterality, Correct Procedure, Correct Position, site marked, Risks and benefits discussed,  Surgical consent,  Pre-op evaluation,  At surgeon's request and post-op pain management   Prep: chloraprep       Needles:  Injection technique: Single-shot  Needle Type: Other   (25G)        Additional Needles:  Procedures: other Digital block Narrative:   Performed by: Personally   Additional Notes: Patient tolerated the procedure well without complications

## 2015-06-27 NOTE — ED Notes (Addendum)
Patient reports splint to left middle finger x 6 weeks, patient reports there was a blood blister underneath and was noted by physicians. Physician drained it and then patient followed up with wound care today. Patient states area is extremely infected. Patient with noted with fever. Patient with several open wounds to hands and feed. Patient is wheelchair bound and dialysis 3 days a week left thigh. Patient was also to have MRI of foot today. Pt reports she is being worked up with infectious disease for abnormal culture.

## 2015-06-27 NOTE — ED Notes (Signed)
Attempted report 

## 2015-06-27 NOTE — Telephone Encounter (Addendum)
Patient is currently in [facility] ED with additional "spots of infection" on her finger.  She is not scheduled for follow up with Dr. Tommy Medal until 11/28. No note of upcoming surgery. Please advise if you'd like anything drawn/cultured. Landis Gandy, RN

## 2015-06-27 NOTE — Progress Notes (Signed)
Pt taken to holding for OR per bed & monitored
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
## 2015-06-27 NOTE — ED Notes (Signed)
Per Lonna Cobb, PA-C zosyn stopped because pt became dizzy in X-ray with brief unresponsiveness and difficulty breathing.

## 2015-06-27 NOTE — Transfer of Care (Signed)
Immediate Anesthesia Transfer of Care Note  Patient: [name]  Procedure(s) Performed: Procedure(s): Irrigation and Debridement Left Middle finger,  Partial Amputation of left middle finger (Left)  Patient Location: Nursing Unit  Anesthesia Type:Regional  Level of Consciousness: awake, alert  and oriented  Airway & Oxygen Therapy: Patient Spontanous Breathing  Post-op Assessment: Report given to RN and Post -op Vital signs reviewed and stable  Post vital signs: Reviewed and stable  Last Vitals:  Filed Vitals:   06/27/15 1839  BP: 98/30  Pulse: 98  Temp: 37.6 C  Resp: 20    Complications: No apparent anesthesia complications

## 2015-06-27 NOTE — Anesthesia Preprocedure Evaluation (Addendum)
Anesthesia Evaluation  Patient identified by MRN, date of birth, ID band Patient awake    Reviewed: Allergy & Precautions, NPO status , Patient's Chart, lab work & pertinent test results  History of Anesthesia Complications Negative for: history of anesthetic complications  Airway Mallampati: II  TM Distance: >3 FB Neck ROM: Full    Dental no notable dental hx.    Pulmonary neg pulmonary ROS,    Pulmonary exam normal breath sounds clear to auscultation       Cardiovascular negative cardio ROS Normal cardiovascular exam Rhythm:Regular Rate:Normal     Neuro/Psych negative neurological ROS  negative psych ROS   GI/Hepatic negative GI ROS, Neg liver ROS,   Endo/Other  diabetes, Type 2, Insulin DependentMorbid obesity  Renal/GU Dialysis and ESRFRenal diseasenegative Renal ROS  negative genitourinary   Musculoskeletal negative musculoskeletal ROS (+)   Abdominal   Peds negative pediatric ROS (+)  Hematology negative hematology ROS (+)   Anesthesia Other Findings   Reproductive/Obstetrics negative OB ROS                          Anesthesia Physical Anesthesia Plan  ASA: III  Anesthesia Plan: MAC and Regional   Post-op Pain Management:    Induction: Intravenous  Airway Management Planned:   Additional Equipment:   Intra-op Plan:   Post-operative Plan: Extubation in OR  Informed Consent: I have reviewed the patients History and Physical, chart, labs and discussed the procedure including the risks, benefits and alternatives for the proposed anesthesia with the patient or authorized representative who has indicated his/her understanding and acceptance.   Dental advisory given  Plan Discussed with: CRNA and Surgeon  Anesthesia Plan Comments:        Anesthesia Quick Evaluation
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
## 2015-06-27 NOTE — Progress Notes (Signed)
Pt received from ED. Alert/oriented. Aware that she has pending surgery planned for Lt long finger.  Pt oriented to room CHG bath done and MRSA /Staph swab done

## 2015-06-28 ENCOUNTER — Encounter (HOSPITAL_COMMUNITY): Payer: Self-pay | Admitting: Radiology

## 2015-06-28 ENCOUNTER — Inpatient Hospital Stay (HOSPITAL_COMMUNITY): Payer: Medicare Other

## 2015-06-28 LAB — CBC
HCT: 39.6 % (ref 36.0–46.0)
Hemoglobin: 11.5 g/dL — ABNORMAL LOW (ref 12.0–15.0)
MCH: 27.1 pg (ref 26.0–34.0)
MCHC: 29 g/dL — ABNORMAL LOW (ref 30.0–36.0)
MCV: 93.2 fL (ref 78.0–100.0)
Platelets: 163 10*3/uL (ref 150–400)
RBC: 4.25 MIL/uL (ref 3.87–5.11)
RDW: 17.9 % — ABNORMAL HIGH (ref 11.5–15.5)
WBC: 7.7 10*3/uL (ref 4.0–10.5)

## 2015-06-28 LAB — GLUCOSE, CAPILLARY
Glucose-Capillary: 348 mg/dL — ABNORMAL HIGH (ref 65–99)
Glucose-Capillary: 210 mg/dL — ABNORMAL HIGH (ref 65–99)
Glucose-Capillary: 193 mg/dL — ABNORMAL HIGH (ref 65–99)

## 2015-06-28 LAB — RENAL FUNCTION PANEL
Albumin: 2.8 g/dL — ABNORMAL LOW (ref 3.5–5.0)
Anion gap: 14 (ref 5–15)
BUN: 12 mg/dL (ref 6–20)
CO2: 30 mmol/L (ref 22–32)
Calcium: 8.8 mg/dL — ABNORMAL LOW (ref 8.9–10.3)
Chloride: 95 mmol/L — ABNORMAL LOW (ref 101–111)
Creatinine, Ser: 2.84 mg/dL — ABNORMAL HIGH (ref 0.44–1.00)
GFR calc Af Amer: 21 mL/min — ABNORMAL LOW (ref >60)
GFR calc non Af Amer: 19 mL/min — ABNORMAL LOW (ref >60)
Glucose, Bld: 129 mg/dL — ABNORMAL HIGH (ref 65–99)
Phosphorus: 3.4 mg/dL (ref 2.5–4.6)
Potassium: 3.4 mmol/L — ABNORMAL LOW (ref 3.5–5.1)
Sodium: 139 mmol/L (ref 135–145)

## 2015-06-28 LAB — PHOSPHORUS: Phosphorus: 6.9 mg/dL — ABNORMAL HIGH (ref 2.5–4.6)

## 2015-06-28 MED ORDER — MIDODRINE HCL 5 MG PO TABS
ORAL_TABLET | ORAL | Status: AC
  Filled 2015-06-28: qty 4

## 2015-06-28 MED ORDER — HEPARIN SODIUM (PORCINE) 1000 UNIT/ML DIALYSIS: 1000.0000 [IU] | INTRAMUSCULAR | Status: DC | PRN

## 2015-06-28 MED ORDER — SODIUM CHLORIDE 0.9 % IV SOLN: 100.0000 mL | INTRAVENOUS | Status: DC | PRN

## 2015-06-28 MED ORDER — SODIUM CHLORIDE 0.9 % IV BOLUS (SEPSIS)
1000.0000 mL | Freq: Once | INTRAVENOUS | Status: DC
Start: 2015-06-28 — End: 2015-06-29

## 2015-06-28 MED ORDER — SILVER SULFADIAZINE 1 % EX CREA
TOPICAL_CREAM | Freq: Every day | CUTANEOUS | Status: DC
  Administered 2015-06-28 – 2015-06-29 (×2): via TOPICAL
  Administered 2015-06-30: 1 via TOPICAL
  Administered 2015-07-01 – 2015-07-03 (×3): via TOPICAL
  Filled 2015-06-28: qty 85

## 2015-06-28 MED ORDER — CALCITRIOL 0.5 MCG PO CAPS
0.7500 ug | ORAL_CAPSULE | ORAL | Status: DC
  Administered 2015-07-03: 0.75 ug via ORAL
  Filled 2015-06-28 (×4): qty 1

## 2015-06-28 MED ORDER — ALTEPLASE 2 MG IJ SOLR
2.0000 mg | Freq: Once | INTRAMUSCULAR | Status: DC | PRN
  Filled 2015-06-28: qty 2

## 2015-06-28 MED ORDER — LIDOCAINE HCL (PF) 1 % IJ SOLN: 5.0000 mL | INTRAMUSCULAR | Status: DC | PRN

## 2015-06-28 MED ORDER — MIDODRINE HCL 5 MG PO TABS
20.0000 mg | ORAL_TABLET | ORAL | Status: DC | PRN
  Administered 2015-06-28 – 2015-07-03 (×3): 20 mg via ORAL
  Filled 2015-06-28 (×2): qty 4

## 2015-06-28 MED ORDER — PNEUMOCOCCAL VAC POLYVALENT 25 MCG/0.5ML IJ INJ
0.5000 mL | INJECTION | INTRAMUSCULAR | Status: DC
  Filled 2015-06-28: qty 0.5

## 2015-06-28 MED ORDER — PENTAFLUOROPROP-TETRAFLUOROETH EX AERO: 1.0000 "application " | INHALATION_SPRAY | CUTANEOUS | Status: DC | PRN

## 2015-06-28 MED ORDER — LIDOCAINE-PRILOCAINE 2.5-2.5 % EX CREA
1.0000 "application " | TOPICAL_CREAM | CUTANEOUS | Status: DC | PRN
  Filled 2015-06-28: qty 5

## 2015-06-28 MED ORDER — SODIUM CHLORIDE 0.9 % IV SOLN: INTRAVENOUS | Status: DC
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
## 2015-06-28 NOTE — Progress Notes (Addendum)
eLink Physician-Brief Progress Note Patient Name: [name] DOB: 1965/11/28 MRN: [id number]   Date of Service  06/28/2015  HPI/Events of Note  Hypotension.    eICU Interventions  Will give fluid bolus NS 1 liter, and start NS 50 ml/hr.      Intervention Category Major Interventions: Other:  Akaysha Cobern 06/28/2015, 12:09 AM

## 2015-06-28 NOTE — Progress Notes (Signed)
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 Patient with a lactic acid initially of 5.02 with a fever of 101F. Plastic surgery hand was consult in the ED and will likely take patient for amputation once medically stable to do so. -Blood cultures 2 -Patient is on vancomycin, aztreonam and Flagyl continue current medications. -Incision and drainage done by Dr. Lenon Curt for the left third digit.  Diabetic foot ulcer  Patient was previously seen by infectious disease on 06/13/2015 and it was ordered that the patient were to have a MRI without contrast of the right foot tomorrow as an outpatient. However patient is hospitalized. It appears they were looking for possible signs of osteomyelitis -Consult infectious disease for antibiotic recommendations.  -MRI without contrast of the right foot ordered  End stage renal failure on dialysis [facility]): Patient dialyzed M/W/F -Consulted nephrology to continue patient dialysis tomorrow consult called to Dr. 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 Zosyn was placed on patient's allergy list as it appears that she had an respiratory distress after receiving this medicine.   History of amputations    MORBID OBESITY: Stable  Dependent for wheelchair mobility  Code Status: Full Code Family Communication: Plan discussed with the patient. Disposition Plan: Remains inpatient Diet: Diet renal/carb modified with fluid restriction Diet-HS Snack?: Nothing; Room service appropriate?: Yes; Fluid consistency:: Thin  Consultants:  nephrology  Procedures:  None  Antibiotics:  Vancomycin, aztreonam and Flagyl.   Objective: Filed Vitals:   06/28/15 1206  BP: 120/72  Pulse:   Temp: 100.2 F (37.9 C)  Resp:     Intake/Output Summary (Last 24 hours) at 06/28/15 1332 Last data filed at 06/28/15 1300  Gross per 24 hour  Intake   1380 ml  Output      0 ml  Net   1380 ml   Filed Weights   06/27/15 1839  Weight: 108.6 kg (239 lb 6.7 oz)    Exam: General: Alert and awake, oriented x3, not in any acute distress. HEENT: anicteric sclera, pupils reactive to light and accommodation, EOMI CVS: S1-S2 clear, no murmur rubs or gallops Chest: clear to auscultation bilaterally, no wheezing, rales or rhonchi Abdomen: soft nontender, nondistended, normal bowel sounds, no organomegaly Extremities: no cyanosis, clubbing or edema noted bilaterally Neuro: Cranial nerves II-XII intact, no focal neurological deficits  Data Reviewed: Basic Metabolic Panel:  Recent Labs Lab 06/27/15 1414 06/28/15 0540  NA 137  --   K 4.6  --  CL 89*  --   CO2 27  --   GLUCOSE 286*  --   BUN 36*  --   CREATININE 5.71*  --   CALCIUM 9.1  --   PHOS  --  6.9*   Liver Function Tests:  Recent Labs Lab 06/27/15 1414  AST 25  ALT 20  ALKPHOS 284*  BILITOT 0.9  PROT 6.9  ALBUMIN  3.1*   No results for input(s): LIPASE, AMYLASE in the last 168 hours. No results for input(s): AMMONIA in the last 168 hours. CBC:  Recent Labs Lab 06/27/15 1414  WBC 10.2  NEUTROABS 8.4*  HGB 12.8  HCT 43.6  MCV 94.2  PLT 204   Cardiac Enzymes:  Recent Labs Lab 06/27/15 1645  TROPONINI <0.03   BNP (last 3 results) No results for input(s): BNP in the last 8760 hours.  ProBNP (last 3 results) No results for input(s): PROBNP in the last 8760 hours.  CBG:  Recent Labs Lab 06/27/15 2204 06/28/15 0820 06/28/15 1204  GLUCAP 158* 348* 210*    Micro Recent Results (from the past 240 hour(s))  Blood Culture (routine x 2)     Status: None (Preliminary result)   Collection Time: 06/27/15  3:05 PM  Result Value Ref Range Status   Specimen Description BLOOD RIGHT FOREARM  Final   Special Requests BOTTLES DRAWN AEROBIC AND ANAEROBIC 5CC  Final   Culture NO GROWTH < 24 HOURS  Final   Report Status PENDING  Incomplete  Blood Culture (routine x 2)     Status: None (Preliminary result)   Collection Time: 06/27/15  4:45 PM  Result Value Ref Range Status   Specimen Description BLOOD LEFT ANTECUBITAL  Final   Special Requests BOTTLES DRAWN AEROBIC AND ANAEROBIC 5CC  Final   Culture NO GROWTH < 24 HOURS  Final   Report Status PENDING  Incomplete  Surgical pcr screen     Status: None   Collection Time: 06/27/15  7:27 PM  Result Value Ref Range Status   MRSA, PCR NEGATIVE NEGATIVE Final   Staphylococcus aureus NEGATIVE NEGATIVE Final    Comment:        The Xpert SA Assay (FDA approved for NASAL specimens in patients over 86 years of age), is one component of a comprehensive surveillance program.  Test performance has been validated by [facility] for patients greater than or equal to 38 year old. It is not intended to diagnose infection nor to guide or monitor treatment.      Studies: Mr Foot Right Wo Contrast  06/28/2015  CLINICAL DATA:  Foot ulceration of the  third toe in a diabetic patient. History of prior amputations. Initial encounter. EXAM: MRI OF THE RIGHT FOREFOOT WITHOUT CONTRAST TECHNIQUE: Multiplanar, multisequence MR imaging was performed. No intravenous contrast was administered. COMPARISON:  Outside plain films of the right foot 05/21/2015. FINDINGS: Amputation at the level of the base of the first metatarsal and midshaft of the second metatarsal is seen as on the comparison plain films. There is no bone marrow signal abnormality or fluid collection about the third toe to suggest osteomyelitis or abscess. Mild subcutaneous edema is present over the dorsum of the foot. Marrow edema is seen at the base of the fifth metatarsal with an associated T1 hypo intense line consistent with nondisplaced fracture. Marrow edema is also seen in the calcaneus at the calcaneocuboid joint and base of the first metatarsal remnant most consistent with degenerative disease. No mass is identified. Intrinsic musculature  of the foot is fatty replaced. IMPRESSION: Negative for osteomyelitis or abscess. Subacute appearing nondisplaced fracture base of the fifth metatarsal. Status post amputation at the base of first metatarsal and midshaft of the second metatarsal. Electronically Signed   By: Inge Rise M.D.   On: 06/28/2015 07:19   Dg Chest Portable 1 View  06/27/2015  CLINICAL DATA:  Chest pain for 1 day EXAM: PORTABLE CHEST 1 VIEW COMPARISON:  12/21/2014 FINDINGS: Moderate cardiomegaly. Lungs under aerated and grossly clear. No pneumothorax. No pleural effusion. IMPRESSION: Cardiomegaly without decompensation. Electronically Signed   By: Marybelle Killings M.D.   On: 06/27/2015 16:16   Dg Finger Middle Left  06/27/2015  CLINICAL DATA:  Infected left middle finger wound for 1 week. EXAM: LEFT MIDDLE FINGER 2+V COMPARISON:  04/25/2015 FINDINGS: There is a comminuted fracture of the distal left third phalanx, with complete separation of the 2 fracture fragments. The third  distal interphalangeal joint is disrupted with the proximal shaft of the distal third phalanx situated within the palmar soft tissues proximal to the DIP joint, and the tip of the third distal phalanx within the dorsal soft tissues at the level of the DIP joint. There is severe soft tissue swelling and soft tissue emphysema. Amputation of the second distal phalanx as seen. IMPRESSION: Comminuted fracture of the distal third phalanx with complete separation of the 2 fracture fragments, and severe dislocation at the DIP joint. Severe soft tissue swelling and soft tissue emphysema. Electronically Signed   By: Fidela Salisbury M.D.   On: 06/27/2015 15:57    Scheduled Meds: . acidophilus  1 capsule Oral QHS  . aspirin EC  81 mg Oral QHS  . aztreonam  1 g Intravenous Q24H  . calcitRIOL  0.75 mcg Oral Q M,W,F-HD  . cinacalcet  60 mg Oral Q breakfast  . gabapentin  100 mg Oral QHS  . gabapentin  200 mg Oral Daily  . heparin  5,000 Units Subcutaneous 3 times per day  . insulin aspart  20 Units Subcutaneous TID WC  . insulin glargine  50 Units Subcutaneous QHS  . metronidazole  500 mg Intravenous Q8H  . midodrine  20 mg Oral TID WC  . multivitamin  1 tablet Oral QHS  . pantoprazole  40 mg Oral Daily  . sevelamer carbonate  4,000 mg Oral TID WC  . silver sulfADIAZINE   Topical Daily  . sodium chloride  1,000 mL Intravenous Once  . vancomycin  1,000 mg Intravenous Q M,W,F-HD   Continuous Infusions: . sodium chloride         Time spent: 35 minutes    [facility]  [facility] Pager [phone] If 7PM-7AM, please contact night-coverage at [email or web address], password [facility] 06/28/2015, 1:32 PM  LOS: 1 day

## 2015-06-28 NOTE — Procedures (Signed)
I have personally attended this patient's dialysis session.  2K bath.  Midodrine for BP support. Thigh AVG 400  Jamal Maes, MD [facility] [phone] Pager 06/28/2015, 2:56 PM

## 2015-06-28 NOTE — Consult Note (Addendum)
WOC wound consult note Reason for Consult: Consult requested for buttocks, left finger, and right toe.  Pt is now followed by hand surgery for left finger post-op wound; refer to their team for further questions. 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  Full thickness, .5X.5cm, 100% dry slough, no odor or drainage.  MRI does not indicate abscess or osteomyelitis.  Continue present plan of care with Silvadene to assist with enzymatic debridement of nonviable tissue. Wound type: Right buttock with chronic unstageable wound; .5X.5cm, 100% slough, no odor or drainage. Pressure Ulcer POA: Yes Pt states she is followed as an outpatient by the wound clinic at [facility] and they have ordered betadine and adherent dressing Q day. Pt states she is fine with substituting Silvadene to this location while in the hospital for further debridement, with foam dressing to protect site.  She is very well-informed regarding topical treatment and plan of care and states she will resume follow-up after discharge with her podiatrist and the wound clinic. Please re-consult if further assistance is needed.  Thank-you,  Julien Girt MSN, [place], [place], [place], [place]

## 2015-06-28 NOTE — Consult Note (Signed)
[facility] Renal Consultation Note    Indication for Consultation:  Management of ESRD/hemodialysis; anemia, hypertension/volume and secondary hyperparathyroidism  HPI: [name] is a 49 y.o. female with ESRD secondary to DM on MWF HD for the past 13 years, chronic hypotension, history of multiple vascular access issues, multiple infections (mostly MRSA) over the years who most recently has been followed by [facility] wound center for wounds on fingers, right buttock and 3rd toe. [facility] had also been following her. She had recent I&D and placed on clindamycin, but area became more swollen and red when seen at [facility] yesterday (had developed a "blood blister" from a splint that had been placed on her middle finger to prevent contracture related to a burn she previously sustained in the palm of her hand - this had been opened up and the [facility] folks had given her xeroform gauze to use on it but it became more swollen and gangrenous)).Marland Kitchen  She was advised to go to the ED for admission and elected to come to [facility] because "that is where her kidney docs are".  In the ED she was started on empiric Vanc and Zosyn and subsequently developed acute SOB with unresponsiveness , but no LOC per admission H and P, after Zosyn was started. She states this happened while getting her MRI and she felt dizzy like she was going to pass out.  She was on a nonrebreather, felt like she had a elephant on her chest for a short time and admitted to step down with resolutions of symptoms.  She has had chills as an outpt at dialysis and home but no temperature per se.  Temp on admission was 99.3 and Tmax has been 101, WBC10.2 , CXR neg, finger xray shows 3rd distal comminuted fx and tissue emphysema. Dr. Lenon Curt evaluated pt and took her for amputation of the tip of the same finber due to wet gangrene. Today she feels fine. She is upset that she got a heart healthy diet for breakfast and feels like she was pumped full of  IVF (900cc per EPIC and per RN in stepdown at least 1 500 cc bolus, and she refused the 2nd one - BP normally runs about 80 on TID midodrine)). She has been gradually losing weight (she doesn't know why"). She has no N or V. She has chronic  neuropathy and problems with leg cramps. She chews her fingers due to anxiety. She is not SOB at present. She is due for dialysis today.  Past Medical History  Diagnosis Date  . Idiopathic parathyroidism ([place])     secondary, hyper  . Lung nodule     left lung  . Sinus problem   . Neuropathy ([place])   . Hyperkalemia   . Allergy     seasonal  . Cataract     surgery  . GERD (gastroesophageal reflux disease)   . Low blood pressure   . Neuromuscular disorder ([place])   . Gait difficulty     "uses motor or standard wheelchair" -unsteady on feet.  . Arteriovenous fistula ([place])     Rt. upper arm, 02-08-14 now has AV Goretex graft Lt. thigh  . ESRD (end stage renal disease) ([place])     end stage, on dialysis since 05/2002. dry wt. 113 kg.M-W-F, [place] [address].  Marland Kitchen Complication of anesthesia     BP runs usually low. Had an issue with "twilight sleep" in past.  . 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 Personal history of colonic polyps - adenomas 02/15/2014    02/15/2014 2 small polyps , max 7 mm transverse colon    . Diabetes mellitus     type 2  . Hypotension   . DVT (deep venous thrombosis) ([place]) 05/2009  . Unable to bear weight   . Third degree burn     Hand  March- follwed at [facility]  . Diabetic foot ulcer ([place]) 06/13/2015  . Fungal infection 06/13/2015  . 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 Bite wound of finger 06/13/2015   Past Surgical History  Procedure Laterality Date  . Cataract extraction Bilateral   . Toe amputation Right     Great toe and second toe  . Fistula surgery      multiple graft  . Knee arthroscopy Left   . Arteriovenous graft placement Left     lt. thigh at present  . Av fistula placement Right      right upper arm  . Colonoscopy with propofol N/A 02/15/2014    Procedure: COLONOSCOPY WITH PROPOFOL;  Surgeon: Gatha Mayer, MD;  Location: [facility] ENDOSCOPY;  Service: Endoscopy;  Laterality: N/A;  . Breast surgery Left     removal of  precancerous.  Lumpectomy  . Toe amputation Left     2nd toe and 1/2 3rd toe  . Flair stent graft in lt leg      MR conditional 3T & 1.5T  3000-Gauss/cm or less   Family History  Problem Relation Age of Onset  . Hypertension Mother   . Liver disease Mother   . Hypertension Father   . Colon polyps Father   . Heart disease Father   . Diabetes Maternal Grandmother   . Diabetes Maternal Grandfather   . Diabetes Paternal Grandmother   . Diabetes Paternal Grandfather   . Colon cancer Neg Hx    Social History:  reports that she has never smoked. She has never used smokeless tobacco. She reports that she drinks alcohol. She reports that she does not use illicit drugs. Allergies  Allergen Reactions  . Zosyn [Piperacillin Sod-Tazobactam So] Shortness Of Breath    Passed out  . Amitriptyline Other (See Comments)    sleepy  . Benadryl [Diphenhydramine Hcl (Sleep)] Other (See Comments)    LBP  . Benadryl [Diphenhydramine Hcl]     Drop in BP  . Morphine     Pt is unsure   . Percocet [Oxycodone-Acetaminophen] Nausea And Vomiting  . Tylenol [Acetaminophen] Other (See Comments)    LBP  . Wellbutrin [Bupropion] Other (See Comments)    Bites nails .  Hallucinations    Prior to Admission medications   Medication Sig Start Date End Date Taking? Authorizing Provider  aspirin EC 81 MG tablet Take 81 mg by mouth at bedtime.   Yes Historical Provider, MD  cinacalcet (SENSIPAR) 60 MG tablet Take 60 mg by mouth daily.     Yes Historical Provider, MD  clindamycin (CLEOCIN) 300 MG capsule Take 1 capsule (300 mg total) by mouth 3 (three) times daily. 05/07/15  Yes Trula Slade, DPM  doxycycline (VIBRAMYCIN) 100 MG capsule Take 100 mg by mouth at bedtime.  08/09/13   Yes Historical Provider, MD  gabapentin (NEURONTIN) 100 MG tablet Take 100-200 mg by mouth 2 (two) times daily. 200 mg  in the a.m. And 100 mg  at night   Yes Historical Provider, MD  insulin aspart (NOVOLOG FLEXPEN) 100 UNIT/ML SOPN FlexPen Inject 25 Units into the skin  3 (three) times daily with meals. And pen needles 3/day Patient taking differently: Inject 20 Units into the skin 3 (three) times daily with meals. And pen needles 3/day 03/12/13  Yes Renato Shin, MD  LANTUS SOLOSTAR 100 UNIT/ML SOPN Inject 50 Units into the skin at bedtime.  08/09/13  Yes Historical Provider, MD  loperamide (IMODIUM) 2 MG capsule Take 2 mg by mouth as needed for diarrhea or loose stools.   Yes Historical Provider, MD  midodrine (PROAMATINE) 10 MG tablet Take 20 mg by mouth 3 (three) times daily. Take two 10 mg tabs 3 times a day   Yes Historical Provider, MD  Multiple Vitamin (RENAL MULTIVITAMIN/ZINC) TABS Take 1 tablet by mouth at bedtime.    Yes Historical Provider, MD  omeprazole (PRILOSEC) 20 MG capsule Take 20 mg by mouth at bedtime.    Yes Historical Provider, MD  Probiotic Product (ALIGN PO) Take 1 capsule by mouth at bedtime.    Yes Historical Provider, MD  sevelamer carbonate (RENVELA) 800 MG tablet Take 4,000 mg by mouth 3 (three) times daily with meals.   Yes Historical Provider, MD   Current Facility-Administered Medications  Medication Dose Route Frequency Provider Last Rate Last Dose  . 0.9 %  sodium chloride infusion   Intravenous Continuous Chesley Mires, MD      . acidophilus (RISAQUAD) capsule 1 capsule  1 capsule Oral QHS Norval Morton, MD   1 capsule at 06/27/15 2249  . aspirin EC tablet 81 mg  81 mg Oral QHS Norval Morton, MD   81 mg at 06/27/15 2205  . aztreonam (AZACTAM) 1 g in dextrose 5 % 50 mL IVPB  1 g Intravenous Q24H Rebecka Apley, RPH   1 g at 06/27/15 2152  . calcitRIOL (ROCALTROL) capsule 0.75 mcg  0.75 mcg Oral Q M,W,F-HD Alric Seton, PA-C      . cinacalcet (SENSIPAR) tablet  60 mg  60 mg Oral Q breakfast Norval Morton, MD   60 mg at 06/28/15 0859  . gabapentin (NEURONTIN) capsule 100 mg  100 mg Oral QHS Norval Morton, MD   100 mg at 06/27/15 2205  . gabapentin (NEURONTIN) capsule 200 mg  200 mg Oral Daily Norval Morton, MD   200 mg at 06/28/15 0843  . heparin injection 5,000 Units  5,000 Units Subcutaneous 3 times per day Norval Morton, MD   5,000 Units at 06/28/15 0526  . insulin aspart (novoLOG) injection 20 Units  20 Units Subcutaneous TID WC Norval Morton, MD   20 Units at 06/28/15 0857  . insulin glargine (LANTUS) injection 50 Units  50 Units Subcutaneous QHS Norval Morton, MD   50 Units at 06/27/15 2205  . loperamide (IMODIUM) capsule 2 mg  2 mg Oral PRN Norval Morton, MD      . metroNIDAZOLE (FLAGYL) IVPB 500 mg  500 mg Intravenous Q8H Rebecka Apley, RPH   500 mg at 06/28/15 0846  . midodrine (PROAMATINE) tablet 20 mg  20 mg Oral TID WC Norval Morton, MD   20 mg at 06/28/15 0847  . multivitamin (RENA-VIT) tablet 1 tablet  1 tablet Oral QHS Norval Morton, MD   1 tablet at 06/27/15 2205  . ondansetron (ZOFRAN) injection 4 mg  4 mg Intravenous Q6H PRN Rondell A Tamala Julian, MD      . pantoprazole (PROTONIX) EC tablet 40 mg  40 mg Oral Daily Rondell Charmayne Sheer, MD   40 mg at  06/28/15 0845  . sevelamer carbonate (RENVELA) tablet 4,000 mg  4,000 mg Oral TID WC Norval Morton, MD   4,000 mg at 06/28/15 0843  . silver sulfADIAZINE (SILVADENE) 1 % cream   Topical Daily Verlee Monte, MD      . sodium chloride 0.9 % bolus 1,000 mL  1,000 mL Intravenous Once Chesley Mires, MD   1,000 mL at 06/28/15 0015  . vancomycin (VANCOCIN) IVPB 1000 mg/200 mL premix  1,000 mg Intravenous Q M,W,F-HD Rebecka Apley, [facility]       Labs: Basic Metabolic Panel:  Recent Labs Lab 06/27/15 1414 06/28/15 0540  NA 137  --   K 4.6  --   CL 89*  --   CO2 27  --   GLUCOSE 286*  --   BUN 36*  --   CREATININE 5.71*  --   CALCIUM 9.1  --   PHOS  --  6.9*   Liver Function  Tests:  Recent Labs Lab 06/27/15 1414  AST 25  ALT 20  ALKPHOS 284*  BILITOT 0.9  PROT 6.9  ALBUMIN 3.1*   CBC:  Recent Labs Lab 06/27/15 1414  WBC 10.2  NEUTROABS 8.4*  HGB 12.8  HCT 43.6  MCV 94.2  PLT 204   Cardiac Enzymes:  Recent Labs Lab 06/27/15 1645  TROPONINI <0.03   CBG:  Recent Labs Lab 06/27/15 2204 06/28/15 0820  GLUCAP 158* 348*   Studies/Results: Mr Foot Right Wo Contrast  06/28/2015  CLINICAL DATA:  Foot ulceration of the third toe in a diabetic patient. History of prior amputations. Initial encounter. EXAM: MRI OF THE RIGHT FOREFOOT WITHOUT CONTRAST TECHNIQUE: Multiplanar, multisequence MR imaging was performed. No intravenous contrast was administered. COMPARISON:  Outside plain films of the right foot 05/21/2015. FINDINGS: Amputation at the level of the base of the first metatarsal and midshaft of the second metatarsal is seen as on the comparison plain films. There is no bone marrow signal abnormality or fluid collection about the third toe to suggest osteomyelitis or abscess. Mild subcutaneous edema is present over the dorsum of the foot. Marrow edema is seen at the base of the fifth metatarsal with an associated T1 hypo intense line consistent with nondisplaced fracture. Marrow edema is also seen in the calcaneus at the calcaneocuboid joint and base of the first metatarsal remnant most consistent with degenerative disease. No mass is identified. Intrinsic musculature of the foot is fatty replaced. IMPRESSION: Negative for osteomyelitis or abscess. Subacute appearing nondisplaced fracture base of the fifth metatarsal. Status post amputation at the base of first metatarsal and midshaft of the second metatarsal. Electronically Signed   By: Inge Rise M.D.   On: 06/28/2015 07:19   Dg Chest Portable 1 View  06/27/2015  CLINICAL DATA:  Chest pain for 1 day EXAM: PORTABLE CHEST 1 VIEW COMPARISON:  12/21/2014 FINDINGS: Moderate cardiomegaly. Lungs  under aerated and grossly clear. No pneumothorax. No pleural effusion. IMPRESSION: Cardiomegaly without decompensation. Electronically Signed   By: Marybelle Killings M.D.   On: 06/27/2015 16:16   Dg Finger Middle Left  06/27/2015  CLINICAL DATA:  Infected left middle finger wound for 1 week. EXAM: LEFT MIDDLE FINGER 2+V COMPARISON:  04/25/2015 FINDINGS: There is a comminuted fracture of the distal left third phalanx, with complete separation of the 2 fracture fragments. The third distal interphalangeal joint is disrupted with the proximal shaft of the distal third phalanx situated within the palmar soft tissues proximal to the DIP joint, and the  tip of the third distal phalanx within the dorsal soft tissues at the level of the DIP joint. There is severe soft tissue swelling and soft tissue emphysema. Amputation of the second distal phalanx as seen. IMPRESSION: Comminuted fracture of the distal third phalanx with complete separation of the 2 fracture fragments, and severe dislocation at the DIP joint. Severe soft tissue swelling and soft tissue emphysema. Electronically Signed   By: Fidela Salisbury M.D.   On: 06/27/2015 15:57   ROS: As per HPI otherwise negative. Physical Exam: Filed Vitals:   06/27/15 2046 06/28/15 0000 06/28/15 0400 06/28/15 0822  BP: 77/30 79/32 85/25  101/37  Pulse: 65 70 74   Temp: 99.3 F (37.4 C) 99.4 F (37.4 C)  98.8 F (37.1 C)  TempSrc: Oral Oral  Oral  Resp: 17 21 18    Height:      Weight:      SpO2: 97% 93% 95%      General: chronically ill appearing WF, hygiene marginal Head: Normocephalic, atraumatic, sclera non-icteric, mucus membranes are moist Neck: Supple. JVD not elevated. Lungs: Clear bilaterally to auscultation without wheezes, rales, or rhonchi. Breathing is unlabored. Heart: RRR with S1 S2.  Abdomen: Obese Soft, non-tender, non-distended with normoactive bowel sounds. Buttock: not examined Lower extremities: muscle wasting 1+ LE brawny edema,  multiple toe and partial foot amputations Neuro: Alert and oriented X 3. Moves all extremities spontaneously. Psych:  Responds to questions appropriately with a normal affect, slightly anxious Dialysis Access: left thigh AVGG + bruit  Dialysis Orders:  [facility] 5 hr MWF 180 400/800 EDW 108 - leaving below 107.5 or lower 2 K 2 Ca no profile Left thigh graft heparin 10000 with 7500 mid treatment (VERY HIGH) calcitriol 0.75 just increased. No ESA or Fe Recent labs:  Hgb 12.2 11% sat ferritin 123 (surprisingly low) iPTH 796    Assessment/Plan: 1. Finger gangrene- s/p amp of tip of left long finger 10/27 now on Vanc, flagyl and aztreonam  hx RXN to zosyn on admission- need to inform HD ctr of RXN at d/c 2. ESRD -  MWF - HD today - normally on no heparin post surgery but she requires very high dose heparin at outpt center - will give hourly dose and watch to make sure system doesn't clot 3. Hypotension/volume  - has chronic low BP and generally high IDWG, but not so much lately - even leaving below EDW- midodrine for BP support- goal 4 L today Says she takes extra 20 of midodrine mid TMT in addition to her preHD dose - order written 4. Anemia  - Hgb 12 outpt - no ESA or Fe for months depsite low tsat and even ferritin of 123, which is low considering chronic wounds- follow Hgb 5. Metabolic bone disease - P runs high - ? Diet +/- c/w binders- Calcitriol just increased, sensipar 60 and 5 renvela ac 6. Nutrition - change to renal carb mod diet due to ^ K content of heart healthy diet/vitamin 7. DM - per primary 8. Chronic wounds - left 3rd toe, right buttock 5 x 5 cm unstagable per WOC RN- followed by [facility] wound clinic- care as outlined by RN 9. Disp - possible d/c tomorrow 10. 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  Current issue with gangrenous finger requiring distal amputation was sequella of a finger splint applied to 3rd finger to prevent contracture - "blood blister" with subsequent infection (related to a palmar burn she sustained more remotely that [facility] was managing) Now getting vanco and aztreonam after a presumed allergic reaction to zosyn rec'd afer arrival in the ED. Plan for usual HD today, midodrine pre (and prn mid treatment as she usually does).   Regina Ganci B,MD 06/28/2015 1:38 PM

## 2015-06-28 NOTE — Progress Notes (Signed)
[name], [name] ([id number]) Visit Report for 06/27/2015 Chief Complaint Document Details Tonya, [name] 06/27/2015 11:00 Patient Name: Date of Service: [name] Medical Record Patient Account Number: [id number] Number: Treating RN: Date of Birth/Sex: 1965-12-24 (49 y.o. Female) Other Clinician: Primary Care Physician: Arnette Norris Treating [name] Referring Physician: Arnette Norris Physician/Extender: Suella Grove in Treatment: 9 Information Obtained from: Patient Chief Complaint Patient presents to the wound care center for a consult due non healing wound The patient has one area on her right gluteal region and several fingers involved with ulcerations. This has been there for about 3 months. Electronic Signature(s) Signed: 06/27/2015 11:51:18 AM By: [name] MD, FACS Entered By: [name] on 06/27/2015 11:51:18 CHEYLA, DUCHEMIN ([id number]) -------------------------------------------------------------------------------- HPI Details Tonya, [name] 06/27/2015 11:00 Patient Name: Date of Service: [name] Medical Record Patient Account Number: [id number] Number: Treating RN: Date of Birth/Sex: 12/27/1965 (49 y.o. Female) Other Clinician: Primary Care Physician: Arnette Norris Treating [name] Referring Physician: Arnette Norris Physician/Extender: Suella Grove in Treatment: 9 History of Present Illness Location: right gluteal area and 3 fingers on her right hand and 4 fingers on her left hand involved with ulceration. Quality: Patient reports No Pain. Severity: Patient states wound are getting worse. Duration: Patient has had the wound for > 3 months prior to seeking treatment at the wound center Context: The wound appeared gradually over time Modifying Factors: Patient is currently on renal dialysis and receives treatments 3 times weekly HPI Description: Pleasant 49 year old patient who is known to the wound clinic from previous visits. She has  significant diabetic neuropathy and has no sensation in her hands and feet. She is wheelchair-bound and takes dialysis on a regular basis 3 times a week. 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 Past medical history significant for diabetes mellitus,end-stage renal disease on hemodialysis, coronary artery disease, multiple amputations, sleep apnea, wheelchair bound due to severe peripheral neuropathy. She has now declined quite significantly and is wheelchair-bound all the time. She has developed decubitus ulcers of the skin and has been referred to [place] by her PCP. She was also recently seen by Dr. Celesta Gentile for her feet and he noticed that she had a ulceration of the right third toe and put her on Levaquin and local care. She says she is laying down in bed much more now and has been trying to offload as much as possible. Due to a psychiatric issue she has been biting her fingernails and has caused much ulceration to both hands. Part of it may be related to the medication she was on. 05/14/2015 -- she says she is very sore around her anus and the vagina and has a lot of redness and problems there. I have reviewed her x-rays which were done of both hands and they show: X-ray of the left hand shows no acute fracture, dislocation or convincing osteomyelitis. X-ray of the right hand shows deformities of the distal phalanx of the first second and third digits with peripheral vascular disease but no evidence of acute fracture dislocation or osteomyelitis. 05/28/2015 -- she has seen the dermatologist and will also be seeing the infectious disease doctor soon and is on a proper regimen for her multiple ailments. 06/27/2015 -- she has been wearing a splint on her left hand and last week she had a blister and bleb on the left middle finger which was debrided  by the burn  center at [facility]. Since then she's been having a lot of pain and swelling. She has been worse for the last 3-4 days. KENDALYNN, WIDEMAN ([id number]) Electronic Signature(s) Signed: 06/27/2015 11:52:44 AM By: [name] MD, FACS Entered By: [name] on 06/27/2015 11:52:44 MARCHETA, HORSEY ([id number]) -------------------------------------------------------------------------------- Physical Exam Details [name], [name] 06/27/2015 11:00 Patient Name: Date of Service: [name] Medical Record Patient Account Number: [id number] Number: Treating RN: Date of Birth/Sex: 1965-11-15 (49 y.o. Female) Other Clinician: Primary Care Physician: Arnette Norris Treating [name] Referring Physician: Arnette Norris Physician/Extender: Suella Grove in Treatment: 9 Constitutional . Pulse regular. Respirations normal and unlabored. Afebrile. . Eyes Nonicteric. Reactive to light. Ears, Nose, Mouth, and Throat Lips, teeth, and gums WNL.[name] Moist mucosa without lesions . Neck supple and nontender. No palpable supraclavicular or cervical adenopathy. Normal sized without goiter. Respiratory WNL. No retractions.. Cardiovascular Pedal Pulses WNL. No clubbing, cyanosis or edema. Gastrointestinal (GI) Abdomen without masses or tenderness.. No liver or spleen enlargement or tenderness.. Genitourinary (GU) No hydrocele, spermatocele, tenderness of the cord, or testicular mass.[name] Penis without lesions.[name] without lesions. No cystocele, or rectocele. Pelvic support intact, no discharge. [name] Urethra without masses, tenderness or scarring.[name] Lymphatic No adneopathy. No adenopathy. No adenopathy. Musculoskeletal Adexa without tenderness or enlargement.. Digits and nails w/o clubbing, cyanosis, infection, petechiae, ischemia, or inflammatory conditions.. Integumentary (Hair, Skin) No suspicious lesions. No crepitus or fluctuance. No peri-wound warmth or erythema. No  masses.[name] Psychiatric Judgement and insight Intact.. No evidence of depression, anxiety, or agitation.. Notes The left middle finger has a large necrotic area on the palmar aspect of the middle phalanx. This is full- thickness necrosis with significant swelling and possible tenosynovitis going down to her proximal metacarpophalangeal region. the rest of the fingertips are in its usual states of distal eschar and dry gangrene. The eschar on the right gluteal area is looking very good and is minimal. NADALIE, LAUGHNER ([id number]) Electronic Signature(s) Signed: 06/27/2015 11:54:44 AM By: [name] MD, FACS Entered By: [name] on 06/27/2015 11:54:43 JULLIA, MULLIGAN ([id number]) -------------------------------------------------------------------------------- Physician Orders Details AFTAN, VINT 06/27/2015 11:00 Patient Name: Date of Service: [name] Medical Record Patient Account Number: [id number] Number: Treating RN: Montey Hora Date of Birth/Sex: [birth year] (49 y.o. Female) Other Clinician: Primary Care Physician: Arnette Norris Treating [name] Referring Physician: Arnette Norris Physician/Extender: Suella Grove in Treatment: 9 Verbal / Phone Orders: Yes Clinician: Montey Hora Read Back and Verified: Yes Diagnosis Coding Wound Cleansing Wound #10 Right Sacrum o Clean wound with Normal Saline. Wound #12 Right Hand - 1st Digit o Clean wound with Normal Saline. Wound #14 Left Hand - 1st Digit o Clean wound with Normal Saline. Wound #15 Left Hand - 2nd Digit o Clean wound with Normal Saline. Wound #16 Left Hand - 3rd Digit o Clean wound with Normal Saline. Wound #17 Left Hand - 4th Digit o Clean wound with Normal Saline. Wound #19 Right Hand - 4th Digit o Clean wound with Normal Saline. Wound #20 Right Hand - 5th Digit o Clean wound with Normal Saline. Primary Wound Dressing Wound #10 Right Sacrum o Hydrogel Wound #12  Right Hand - 1st Digit o Other: - betadine Wound #14 Left Hand - 1st Digit o Other: - betadine Wound #15 Left Hand - 2nd Digit Coccia, Anastazja R. ([id number]) o Other: - betadine Wound #17 Left Hand - 4th Digit o Other: - betadine Wound #19 Right Hand - 4th Digit o Other: - betadine Wound #20  Right Hand - 5th Digit o Other: - betadine Wound #16 Left Hand - 3rd Digit o Non-adherent pad Secondary Dressing Wound #10 Right Sacrum o Boardered Foam Dressing Dressing Change Frequency Wound #10 Right Sacrum o Change dressing every day. Wound #12 Right Hand - 1st Digit o Change dressing every day. Wound #14 Left Hand - 1st Digit o Change dressing every day. Wound #15 Left Hand - 2nd Digit o Change dressing every day. Wound #16 Left Hand - 3rd Digit o Change dressing every day. Wound #17 Left Hand - 4th Digit o Change dressing every day. Wound #19 Right Hand - 4th Digit o Change dressing every day. Wound #20 Right Hand - 5th Digit o Change dressing every day. Follow-up Appointments Wound #10 Right Sacrum o Return Appointment in 1 week. Wound #12 Right Hand - 1st Digit APPOLLONIA, KLEE R. ([id number]) o Return Appointment in 1 week. Wound #14 Left Hand - 1st Digit o Return Appointment in 1 week. Wound #15 Left Hand - 2nd Digit o Return Appointment in 1 week. Wound #16 Left Hand - 3rd Digit o Return Appointment in 1 week. Wound #17 Left Hand - 4th Digit o Return Appointment in 1 week. Wound #19 Right Hand - 4th Digit o Return Appointment in 1 week. Wound #20 Right Hand - 5th Digit o Return Appointment in 1 week. Off-Loading Wound #10 Right Sacrum o Turn and reposition every 2 hours o Mattress - appropriate support surface for mattress Notes Go straight to [facility] for Left Hand 3rd Digit Wound Electronic Signature(s) Signed: 06/27/2015 3:59:19 PM By: [name] MD, FACS Signed: 06/27/2015 5:54:13 PM By:  Montey Hora Entered By: Montey Hora on 06/27/2015 11:45:47 Caudle, Danton Sewer ([id number]) -------------------------------------------------------------------------------- Problem List Details BRIEL, GALLICCHIO 06/27/2015 11:00 Patient Name: Date of Service: [name] Medical Record Patient Account Number: [id number] Number: Treating RN: Date of Birth/Sex: Apr 15, 1966 (49 y.o. Female) Other Clinician: Primary Care Physician: Arnette Norris Treating [name] Referring Physician: Arnette Norris Physician/Extender: Suella Grove in Treatment: 9 Active Problems ICD-10 Encounter Code Description Active Date Diagnosis E11.40 Type 2 diabetes mellitus with diabetic neuropathy, 04/23/2015 Yes unspecified Z99.2 Dependence on renal dialysis 04/23/2015 Yes L89.310 Pressure ulcer of right buttock, unstageable 04/23/2015 Yes S61.300A Unspecified open wound of right index finger with damage 04/23/2015 Yes to nail, initial encounter S61.301A Unspecified open wound of left index finger with damage 04/23/2015 Yes to nail, initial encounter S61.307A Unspecified open wound of left little finger with damage to 04/23/2015 Yes nail, initial encounter S61.303A Unspecified open wound of left middle finger with damage 04/23/2015 Yes to nail, initial encounter Inactive Problems Resolved Problems Electronic Signature(s) JAMEAH, ROUSER ([id number]) Signed: 06/27/2015 11:51:09 AM By: [name] MD, FACS Entered By: [name] on 06/27/2015 11:51:09 [name] ([id number]) -------------------------------------------------------------------------------- Progress Note Details Postiglione, Rodney 06/27/2015 11:00 Patient Name: Date of Service: [name] Medical Record Patient Account Number: [id number] Number: Treating RN: Date of Birth/Sex: 09/25/1965 (49 y.o. Female) Other Clinician: Primary Care Physician: Arnette Norris Treating [name] Referring Physician: Arnette Norris Physician/Extender: Suella Grove in Treatment: 9 Subjective Chief Complaint Information obtained from Patient Patient presents to the wound care center for a consult due non healing wound The patient has one area on her right gluteal region and several fingers involved with ulcerations. This has been there for about 3 months. History of Present Illness (HPI) The following HPI elements were documented for the patient's wound: Location: right gluteal area and 3 fingers on her right hand and  4 fingers on her left hand involved with ulceration. Quality: Patient reports No Pain. Severity: Patient states wound are getting worse. Duration: Patient has had the wound for > 3 months prior to seeking treatment at the wound center Context: The wound appeared gradually over time Modifying Factors: Patient is currently on renal dialysis and receives treatments 3 times weekly Pleasant 49 year old patient who is known to the wound clinic from previous visits. She has significant diabetic neuropathy and has no sensation in her hands and feet. She is wheelchair-bound and takes dialysis on a regular basis 3 times a week. 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 Past medical history significant for diabetes mellitus,end-stage renal disease on hemodialysis, coronary artery disease, multiple amputations, sleep apnea, wheelchair bound due to severe peripheral neuropathy. She has now declined quite significantly and is wheelchair-bound all the time. She has developed decubitus ulcers of the skin and has been referred to [place] by her PCP. She was also recently seen by Dr. Celesta Gentile for her feet and he noticed that she had a ulceration of the right third toe and put her on Levaquin and local care. She says she is laying down in bed much more now and has been trying to offload as  much as possible. Due to a psychiatric issue she has been biting her fingernails and has caused much ulceration to both hands. Part of it may be related to the medication she was on. 05/14/2015 -- she says she is very sore around her anus and the vagina and has a lot of redness and problems there. I have reviewed her x-rays which were done of both hands and they show: X-ray of the left hand shows no acute fracture, dislocation or convincing osteomyelitis. X-ray of the right hand shows deformities of the KENADIE, ROYCE. ([id number]) distal phalanx of the first second and third digits with peripheral vascular disease but no evidence of acute fracture dislocation or osteomyelitis. 05/28/2015 -- she has seen the dermatologist and will also be seeing the infectious disease doctor soon and is on a proper regimen for her multiple ailments. 06/27/2015 -- she has been wearing a splint on her left hand and last week she had a blister and bleb on the left middle finger which was debrided by the burn center at [facility]. Since then she's been having a lot of pain and swelling. She has been worse for the last 3-4 days. Objective Constitutional Pulse regular. Respirations normal and unlabored. Afebrile. Vitals Time Taken: 11:12 AM, Height: 68 in, Weight: 220 lbs, BMI: 33.4, Temperature: 98.3 F, Pulse: 79 bpm, Respiratory Rate: 18 breaths/min, Blood Pressure: 102/42 mmHg. Eyes Nonicteric. Reactive to light. Ears, Nose, Mouth, and Throat Lips, teeth, and gums WNL.[name] Moist mucosa without lesions . Neck supple and nontender. No palpable supraclavicular or cervical adenopathy. Normal sized without goiter. Respiratory WNL. No retractions.. Cardiovascular Pedal Pulses WNL. No clubbing, cyanosis or edema. Gastrointestinal (GI) Abdomen without masses or tenderness.. No liver or spleen enlargement or tenderness.. Genitourinary (GU) No hydrocele, spermatocele, tenderness of the cord, or testicular  mass.[name] Penis without lesions.[name] without lesions. No cystocele, or rectocele. Pelvic support intact, no discharge. [name] Urethra without masses, tenderness or scarring.[name] Lymphatic No adneopathy. No adenopathy. No adenopathy. Musculoskeletal CLARRISA, KAYLOR R. ([id number]) Adexa without tenderness or enlargement.. Digits and nails w/o clubbing, cyanosis, infection, petechiae, ischemia, or inflammatory conditions.[name]  Psychiatric Judgement and insight Intact.. No evidence of depression, anxiety, or agitation.. General Notes: The left middle finger has a large necrotic area on the palmar aspect of the middle phalanx. This is full-thickness necrosis with significant swelling and possible tenosynovitis going down to her proximal metacarpophalangeal region. the rest of the fingertips are in its usual states of distal eschar and dry gangrene. The eschar on the right gluteal area is looking very good and is minimal. Integumentary (Hair, Skin) No suspicious lesions. No crepitus or fluctuance. No peri-wound warmth or erythema. No masses.. Wound #10 status is Open. Original cause of wound was Pressure Injury. The wound is located on the Right Sacrum. The wound measures 0.8cm length x 1.7cm width x 0.1cm depth; 1.068cm^2 area and 0.107cm^3 volume. Wound #12 status is Open. Original cause of wound was Trauma. The wound is located on the Right Hand - 1st Digit. The wound measures 0.7cm length x 1.3cm width x 0.1cm depth; 0.715cm^2 area and 0.071cm^3 volume. The wound is limited to skin breakdown. There is no tunneling or undermining noted. There is a none present amount of drainage noted. The wound margin is flat and intact. There is medium (34-66%) red granulation within the wound bed. There is a medium (34-66%) amount of necrotic tissue within the wound bed including Eschar. The periwound skin appearance exhibited: Dry/Scaly. The periwound skin appearance did not exhibit: Callus, Crepitus,  Excoriation, Fluctuance, Friable, Induration, Localized Edema, Rash, Scarring, Maceration, Moist, Atrophie Blanche, Cyanosis, Ecchymosis, Hemosiderin Staining, Mottled, Pallor, Rubor, Erythema. Periwound temperature was noted as No Abnormality. Wound #14 status is Open. Original cause of wound was Trauma. The wound is located on the Left Hand - 1st Digit. The wound measures 0.9cm length x 1.2cm width x 0.1cm depth; 0.848cm^2 area and 0.085cm^3 volume. The wound is limited to skin breakdown. There is no tunneling or undermining noted. There is a none present amount of drainage noted. The wound margin is flat and intact. There is small (1-33%) granulation within the wound bed. There is a large (67-100%) amount of necrotic tissue within the wound bed including Eschar. The periwound skin appearance exhibited: Dry/Scaly. The periwound skin appearance did not exhibit: Callus, Crepitus, Excoriation, Fluctuance, Friable, Induration, Localized Edema, Rash, Scarring, Maceration, Moist, Atrophie Blanche, Cyanosis, Ecchymosis, Hemosiderin Staining, Mottled, Pallor, Rubor, Erythema. Periwound temperature was noted as No Abnormality. Wound #15 status is Open. Original cause of wound was Trauma. The wound is located on the Left Hand - 2nd Digit. The wound measures 0.3cm length x 0.7cm width x 0.1cm depth; 0.165cm^2 area and 0.016cm^3 volume. The wound is limited to skin breakdown. There is no tunneling or undermining noted. There is a none present amount of drainage noted. The wound margin is distinct with the outline attached to the wound base. There is no granulation within the wound bed. There is a large (67-100%) amount of necrotic tissue within the wound bed including Eschar. The periwound skin appearance exhibited: Dry/Scaly. The periwound skin appearance did not exhibit: Callus, Crepitus, Excoriation, Fluctuance, Friable, Induration, Localized Edema, Rash, Scarring, Maceration, Moist, Atrophie Blanche,  Cyanosis, Ecchymosis, Hemosiderin Staining, Mottled, Pallor, Rubor, Erythema. Periwound temperature was noted as No Abnormality. VLASTA, BASKIN R. ([id number]) Wound #16 status is Open. Original cause of wound was Trauma. The wound is located on the Left Hand - 3rd Digit. The wound measures 1.5cm length x 7.5cm width x 0.1cm depth; 8.836cm^2 area and 0.884cm^3 volume. The wound is limited to skin breakdown. There is no tunneling or undermining noted. There is a  none present amount of drainage noted. The wound margin is distinct with the outline attached to the wound base. There is no granulation within the wound bed. There is a large (67-100%) amount of necrotic tissue within the wound bed including Eschar. The periwound skin appearance exhibited: Localized Edema, Maceration, Erythema. The periwound skin appearance did not exhibit: Callus, Crepitus, Excoriation, Fluctuance, Friable, Induration, Rash, Scarring, Dry/Scaly, Moist, Atrophie Blanche, Cyanosis, Ecchymosis, Hemosiderin Staining, Mottled, Pallor, Rubor. The surrounding wound skin color is noted with erythema which is circumferential. Periwound temperature was noted as No Abnormality. Wound #17 status is Open. Original cause of wound was Trauma. The wound is located on the Left Hand - 4th Digit. The wound measures 0.6cm length x 1.1cm width x 0.1cm depth; 0.518cm^2 area and 0.052cm^3 volume. The wound is limited to skin breakdown. There is no tunneling or undermining noted. There is a none present amount of drainage noted. The wound margin is distinct with the outline attached to the wound base. There is no granulation within the wound bed. There is a large (67-100%) amount of necrotic tissue within the wound bed including Eschar. The periwound skin appearance exhibited: Dry/Scaly. The periwound skin appearance did not exhibit: Callus, Crepitus, Excoriation, Fluctuance, Friable, Induration, Localized Edema, Rash, Scarring, Maceration,  Moist, Atrophie Blanche, Cyanosis, Ecchymosis, Hemosiderin Staining, Mottled, Pallor, Rubor, Erythema. Periwound temperature was noted as No Abnormality. Wound #19 status is Open. Original cause of wound was Trauma. The wound is located on the Right Hand - 4th Digit. The wound measures 1cm length x 0.8cm width x 0.1cm depth; 0.628cm^2 area and 0.063cm^3 volume. The wound is limited to skin breakdown. There is no tunneling or undermining noted. There is a none present amount of drainage noted. The wound margin is flat and intact. There is no granulation within the wound bed. There is a large (67-100%) amount of necrotic tissue within the wound bed including Eschar. The periwound skin appearance did not exhibit: Callus, Crepitus, Excoriation, Fluctuance, Friable, Induration, Localized Edema, Rash, Scarring, Dry/Scaly, Maceration, Moist, Atrophie Blanche, Cyanosis, Ecchymosis, Hemosiderin Staining, Mottled, Pallor, Rubor, Erythema. Wound #20 status is Open. Original cause of wound was Trauma. The wound is located on the Right Hand - 5th Digit. The wound measures 0.4cm length x 0.4cm width x 0.1cm depth; 0.126cm^2 area and 0.013cm^3 volume. The wound is limited to skin breakdown. There is no tunneling or undermining noted. There is a none present amount of drainage noted. The wound margin is flat and intact. There is no granulation within the wound bed. There is a large (67-100%) amount of necrotic tissue within the wound bed including Eschar. The periwound skin appearance did not exhibit: Callus, Crepitus, Excoriation, Fluctuance, Friable, Induration, Localized Edema, Rash, Scarring, Dry/Scaly, Maceration, Moist, Atrophie Blanche, Cyanosis, Ecchymosis, Hemosiderin Staining, Mottled, Pallor, Rubor, Erythema. Assessment Active Problems ICD-10 E11.40 - Type 2 diabetes mellitus with diabetic neuropathy, unspecified Z99.2 - Dependence on renal dialysis TONIANNE, FINE. ([id number]) L89.310 -  Pressure ulcer of right buttock, unstageable S61.300A - Unspecified open wound of right index finger with damage to nail, initial encounter S61.301A - Unspecified open wound of left index finger with damage to nail, initial encounter S61.307A - Unspecified open wound of left little finger with damage to nail, initial encounter S61.303A - Unspecified open wound of left middle finger with damage to nail, initial encounter The patient has a serious infection of her left hand middle finger with wet gangrene and Tenosynovitis which needs urgent care inpatient in hospital and the opinion of a  hand surgeon ASAP. She chooses to go to the [facility] in [place] and I have asked her to go there immediately from here. if the ER physician needs to talk to [place] we would be more than happy to speak with him and send our notes. Plan Wound Cleansing: Wound #10 Right Sacrum: Clean wound with Normal Saline. Wound #12 Right Hand - 1st Digit: Clean wound with Normal Saline. Wound #14 Left Hand - 1st Digit: Clean wound with Normal Saline. Wound #15 Left Hand - 2nd Digit: Clean wound with Normal Saline. Wound #16 Left Hand - 3rd Digit: Clean wound with Normal Saline. Wound #17 Left Hand - 4th Digit: Clean wound with Normal Saline. Wound #19 Right Hand - 4th Digit: Clean wound with Normal Saline. Wound #20 Right Hand - 5th Digit: Clean wound with Normal Saline. Primary Wound Dressing: Wound #10 Right Sacrum: Hydrogel Wound #12 Right Hand - 1st Digit: Other: - betadine Wound #14 Left Hand - 1st Digit: Other: - betadine Wound #15 Left Hand - 2nd Digit: Other: - betadine Wound #17 Left Hand - 4th Digit: Other: - betadine Wound #19 Right Hand - 4th Digit: Other: - betadine Wound #20 Right Hand - 5th DigitJANARA, KLETT R. ([id number]) Other: - betadine Wound #16 Left Hand - 3rd Digit: Non-adherent pad Secondary Dressing: Wound #10 Right Sacrum: Boardered Foam Dressing Dressing Change  Frequency: Wound #10 Right Sacrum: Change dressing every day. Wound #12 Right Hand - 1st Digit: Change dressing every day. Wound #14 Left Hand - 1st Digit: Change dressing every day. Wound #15 Left Hand - 2nd Digit: Change dressing every day. Wound #16 Left Hand - 3rd Digit: Change dressing every day. Wound #17 Left Hand - 4th Digit: Change dressing every day. Wound #19 Right Hand - 4th Digit: Change dressing every day. Wound #20 Right Hand - 5th Digit: Change dressing every day. Follow-up Appointments: Wound #10 Right Sacrum: Return Appointment in 1 week. Wound #12 Right Hand - 1st Digit: Return Appointment in 1 week. Wound #14 Left Hand - 1st Digit: Return Appointment in 1 week. Wound #15 Left Hand - 2nd Digit: Return Appointment in 1 week. Wound #16 Left Hand - 3rd Digit: Return Appointment in 1 week. Wound #17 Left Hand - 4th Digit: Return Appointment in 1 week. Wound #19 Right Hand - 4th Digit: Return Appointment in 1 week. Wound #20 Right Hand - 5th Digit: Return Appointment in 1 week. Off-Loading: Wound #10 Right Sacrum: Turn and reposition every 2 hours Mattress - appropriate support surface for mattress General Notes: Go straight to [facility] for Left Hand 3rd Digit Wound Monical, Kyesha R. ([id number]) The patient has a serious infection of her left hand middle finger with wet gangrene and Tenosynovitis which needs urgent care inpatient in hospital and the opinion of a hand surgeon ASAP. She chooses to go to the [facility] in [place] and I have asked her to go there immediately from here. if the ER physician needs to talk to [place] we would be more than happy to speak with him and send our notes. Electronic Signature(s) Signed: 06/27/2015 11:56:40 AM By: [name] MD, FACS Entered By: [name] on 06/27/2015 11:56:40 KERIANN, RANKIN ([id number]) -------------------------------------------------------------------------------- SuperBill  Details Patient Name: [name] Date of Service: 06/27/2015 Medical Record Number: [id number] Patient Account Number: [id number] Date of Birth/Sex: 1965/10/02 (49 y.o. 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 Pressure ulcer of right buttock, unstageable S61.300A Unspecified open wound of right index finger with damage to nail, initial encounter S61.301A Unspecified open wound of left index finger with damage to nail, initial encounter S61.307A Unspecified open wound of left little finger with damage to nail, initial encounter S61.303A Unspecified open wound of left middle finger with damage to nail, initial encounter Physician Procedures CPT4: Description Modifier Quantity Code [id number] - WC PHYS LEVEL 3 - EST PT 1 ICD-10 Description Diagnosis E11.40 Type 2 diabetes mellitus with diabetic neuropathy, unspecified Z99.2 Dependence on renal dialysis S61.300A Unspecified open wound of  right index finger with damage to nail, initial encounter S61.301A Unspecified open wound of left index finger with damage to nail, initial encounter Electronic Signature(s) Signed: 06/27/2015 11:57:06 AM By: [name] MD, FACS Entered By: [name] on 06/27/2015 11:57:05

## 2015-06-28 NOTE — Progress Notes (Signed)
Inpatient Diabetes Program Recommendations  AACE/ADA: New Consensus Statement on Inpatient Glycemic Control (2015)  Target Ranges:  Prepandial:   less than 140 mg/dL      Peak postprandial:   less than 180 mg/dL (1-2 hours)      Critically ill patients:  140 - 180 mg/dL   Review of Glycemic Control  Inpatient Diabetes Program Recommendations:  Correction (SSI): add Novolog resistant scale TID+ HS scale Thank you  Raoul Pitch BSN, RN,CDE Inpatient Diabetes Coordinator [phone] (team pager)

## 2015-06-28 NOTE — Progress Notes (Signed)
0.4x0.4x0.1 N/A (cm) Area (cm) : 0.628 0.126 N/A Volume (cm) : 0.063 0.013 N/A % Reduction in Area: N/A N/A N/A % Reduction in Volume: N/A N/A N/A Classification: Partial Thickness Partial Thickness N/A Exudate Amount: None Present None Present N/A Wound Margin: Flat and Intact Flat and Intact N/A Granulation Amount: None Present (0%) None Present (0%) N/A Granulation Quality: N/A N/A N/A Necrotic Amount: Large (67-100%) Large (67-100%) N/A Necrotic Tissue: Eschar Eschar N/A Exposed Structures: Fascia: No Fascia: No N/A Fat: No Fat: No Tendon: No Tendon: No Muscle: No Muscle: No Joint: No Joint:  No Bone: No Bone: No Limited to Skin Limited to Skin Breakdown Breakdown Epithelialization: None None N/A Periwound Skin Texture: Edema: No Edema: No N/A Excoriation: No Excoriation: No Induration: No Induration: No Callus: No Callus: No Crepitus: No Crepitus: No Fluctuance: No Fluctuance: No Friable: No Friable: No Rash: No Rash: No Scarring: No Scarring: No Periwound Skin Maceration: No Maceration: No N/A Moisture: Moist: No Moist: No Dry/Scaly: No Dry/Scaly: No Periwound Skin Color: Atrophie Blanche: No Atrophie Blanche: No N/A Cyanosis: No Cyanosis: No Ecchymosis: No Ecchymosis: No Erythema: No Erythema: No Hemosiderin Staining: No Hemosiderin Staining: No Mottled: No Mottled: No Pallor: No Pallor: No Rubor: No Rubor: No Erythema Location: N/A N/A N/A Temperature: N/A N/A N/A No No N/A [name], [name] ([id number]) Tenderness on Palpation: Wound Preparation: Ulcer Cleansing: Ulcer Cleansing: N/A Rinsed/Irrigated with Rinsed/Irrigated with Saline Saline Topical Anesthetic Topical Anesthetic Applied: None Applied: None Treatment Notes Electronic Signature(s) Signed: 06/27/2015 5:54:13 PM By: Montey Hora Entered By: Montey Hora on 06/27/2015 11:36:54 [name] ([id number]) -------------------------------------------------------------------------------- [place] Details Patient Name: [name] Date of Service: 06/27/2015 11:00 AM Medical Record Number: [id number] Patient Account Number: [id number] Date of Birth/Sex: [birth year] (49 y.o. Female) Treating RN: Montey Hora Primary Care Physician: Arnette Norris Other Clinician: Referring Physician: Arnette Norris Treating Physician/Extender: Frann Rider in Treatment: 9 Active Inactive Abuse / Safety / Falls / Self Care Management Nursing Diagnoses: Impaired physical mobility Potential for falls Goals: Patient will remain injury free Date  Initiated: 04/23/2015 Goal Status: Active Interventions: Assess fall risk on admission and as needed Notes: Orientation to the Wound Care Program Nursing Diagnoses: Knowledge deficit related to the wound healing center program Goals: Patient/caregiver will verbalize understanding of the [facility] Date Initiated: 04/23/2015 Goal Status: Active Interventions: Provide education on orientation to the wound center Notes: Peripheral Neuropathy Nursing Diagnoses: Potential alteration in peripheral tissue perfusion (select prior to confirmation of diagnosis) Goals: Patient/caregiver will verbalize understanding of disease process and disease management [name] ([id number]) Date Initiated: 04/23/2015 Goal Status: Active Interventions: Assess signs and symptoms of neuropathy upon admission and as needed Notes: Pressure Nursing Diagnoses: Potential for impaired tissue integrity related to pressure, friction, moisture, and shear Goals: Patient will remain free from development of additional pressure ulcers Date Initiated: 04/23/2015 Goal Status: Active Interventions: Assess offloading mechanisms upon admission and as needed Notes: Wound/Skin Impairment Nursing Diagnoses: Impaired tissue integrity Goals: Ulcer/skin breakdown will have a volume reduction of 30% by week 4 Date Initiated: 04/23/2015 Goal Status: Active Ulcer/skin breakdown will have a volume reduction of 50% by week 8 Date Initiated: 04/23/2015 Goal Status: Active Ulcer/skin breakdown will have a volume reduction of 80% by week 12 Date Initiated: 04/23/2015 Goal Status: Active Ulcer/skin breakdown will heal within 14 weeks Date Initiated: 04/23/2015 Goal Status: Active Interventions: Assess patient/caregiver ability to obtain necessary supplies Assess ulceration(s) every visit Notes: [name] ([id number]) Electronic  Treatment: 0 History: End Stage Renal Disease, History of Clustered Wound: No Burn, Neuropathy Photos Photo Uploaded By: Montey Hora on 06/27/2015 16:56:19 Wound Measurements Length: (cm) 0.4 Width: (cm) 0.4 Depth: (cm) 0.1 Area: (cm) 0.126 Volume: (cm) 0.013 % Reduction in Area: % Reduction in Volume: Epithelialization: None Tunneling: No Undermining: No Wound Description Classification: Partial Thickness Wound Margin: Flat and Intact Exudate Amount: None Present Foul Odor After Cleansing: No Wound Bed Granulation Amount: None Present (0%) Exposed Structure Necrotic Amount: Large (67-100%) Fascia Exposed: No Necrotic Quality: Eschar Fat Layer Exposed: No Tendon Exposed: No Muscle Exposed: No Joint Exposed: No Hooley, Tonya R. ([id number]) Bone Exposed: No Limited to Skin Breakdown Periwound Skin Texture Texture Color No Abnormalities Noted: No No Abnormalities Noted: No Callus: No Atrophie Blanche: No Crepitus: No Cyanosis: No Excoriation: No Ecchymosis: No Fluctuance: No Erythema: No Friable: No Hemosiderin Staining: No Induration: No Mottled: No Localized Edema: No Pallor: No Rash: No Rubor: No Scarring: No Moisture No  Abnormalities Noted: No Dry / Scaly: No Maceration: No Moist: No Wound Preparation Ulcer Cleansing: Rinsed/Irrigated with Saline Topical Anesthetic Applied: None Treatment Notes Wound #20 (Right Hand - 5th Digit) 1. Cleansed with: Clean wound with Normal Saline 4. Dressing Applied: Other dressing (specify in notes) Notes betadine paint Electronic Signature(s) Signed: 06/27/2015 5:54:13 PM By: Montey Hora Entered By: Montey Hora on 06/27/2015 11:25:50 [name] ([id number]) -------------------------------------------------------------------------------- Vitals Details Patient Name: [name] Date of Service: 06/27/2015 11:00 AM Medical Record Number: [id number] Patient Account Number: [id number] Date of Birth/Sex: 15-Dec-1965 (49 y.o. Female) Treating RN: Montey Hora Primary Care Physician: Arnette Norris Other Clinician: Referring Physician: Arnette Norris Treating Physician/Extender: Frann Rider in Treatment: 9 Vital Signs Time Taken: 11:12 Temperature (F): 98.3 Height (in): 68 Pulse (bpm): 79 Weight (lbs): 220 Respiratory Rate (breaths/min): 18 Body Mass Index (BMI): 33.4 Blood Pressure (mmHg): 102/42 Reference Range: 80 - 120 mg / dl Electronic Signature(s) Signed: 06/27/2015 5:54:13 PM By: Montey Hora Entered By: Montey Hora on 06/27/2015 11:11:55  Callus: No Atrophie Blanche: No Crepitus: No Cyanosis: No Excoriation: No Ecchymosis: No Fluctuance: No Erythema: No Friable: No Hemosiderin Staining: No Induration: No Mottled: No Localized Edema: No Pallor: No Rash: No Rubor: No Scarring: No Temperature / Pain Moisture Temperature: No Abnormality No Abnormalities Noted: No Dry / Scaly: Yes Maceration: No Moist: No Wound Preparation Ulcer Cleansing: Rinsed/Irrigated with Saline Topical Anesthetic Applied: None Treatment Notes Wound #15 (Left Hand - 2nd  Digit) 1. Cleansed with: Clean wound with Normal Saline 4. Dressing Applied: Other dressing (specify in notes) Notes betadine paint Electronic Signature(s) Signed: 06/27/2015 5:54:13 PM By: Montey Hora Entered By: Montey Hora on 06/27/2015 11:27:39 [name] ([id number]) -------------------------------------------------------------------------------- Wound Assessment Details Patient Name: [name] Date of Service: 06/27/2015 11:00 AM Medical Record Number: [id number] Patient Account Number: [id number] Date of Birth/Sex: [birth year] (49 y.o. Female) Treating RN: Montey Hora Primary Care Physician: Arnette Norris Other Clinician: Referring Physician: Arnette Norris Treating Physician/Extender: Frann Rider in Treatment: 9 Wound Status Wound Number: 16 Primary Trauma, Other Etiology: Wound Location: Left Hand - 3rd Digit Wound Open Wounding Event: Trauma Status: Date Acquired: 12/04/2014 Comorbid Cataracts, Anemia, Type II Diabetes, Weeks Of Treatment: 9 History: End Stage Renal Disease, History of Clustered Wound: No Burn, Neuropathy Photos Photo Uploaded By: Montey Hora on 06/27/2015 16:55:11 Wound Measurements Length: (cm) 1.5 Width: (cm) 7.5 Depth: (cm) 0.1 Area: (cm) 8.836 Volume: (cm) 0.884 % Reduction in Area: -4067.9% % Reduction in Volume: -4109.5% Epithelialization: None Tunneling: No Undermining: No Wound Description Full Thickness Without Exposed Classification: Support Structures Wound Margin: Distinct, outline attached Exudate None Present Amount: Foul Odor After Cleansing: No Wound Bed Granulation Amount: None Present (0%) Exposed Structure Necrotic Amount: Large (67-100%) Fascia Exposed: No Necrotic Quality: Eschar Fat Layer Exposed: No Tendon Exposed: No Dorce, Tonya R. ([id number]) Muscle Exposed: No Joint Exposed: No Bone Exposed: No Limited to Skin Breakdown Periwound Skin Texture Texture  Color No Abnormalities Noted: No No Abnormalities Noted: No Callus: No Atrophie Blanche: No Crepitus: No Cyanosis: No Excoriation: No Ecchymosis: No Fluctuance: No Erythema: Yes Friable: No Erythema Location: Circumferential Induration: No Hemosiderin Staining: No Localized Edema: Yes Mottled: No Rash: No Pallor: No Scarring: No Rubor: No Moisture Temperature / Pain No Abnormalities Noted: No Temperature: No Abnormality Dry / Scaly: No Maceration: Yes Moist: No Wound Preparation Ulcer Cleansing: Rinsed/Irrigated with Saline Topical Anesthetic Applied: None Treatment Notes Wound #16 (Left Hand - 3rd Digit) 1. Cleansed with: Clean wound with Normal Saline 5. Secondary Dressing Applied Non-Adherent pad 7. Secured with Recruitment consultant) Signed: 06/27/2015 5:54:13 PM By: Montey Hora Entered By: Montey Hora on 06/27/2015 11:33:59 [name] ([id number]) -------------------------------------------------------------------------------- Wound Assessment Details Patient Name: [name] Date of Service: 06/27/2015 11:00 AM Medical Record Number: [id number] Patient Account Number: [id number] Date of Birth/Sex: Jan 10, 1966 (49 y.o. Female) Treating RN: Montey Hora Primary Care Physician: Arnette Norris Other Clinician: Referring Physician: Arnette Norris Treating Physician/Extender: Frann Rider in Treatment: 9 Wound Status Wound Number: 17 Primary Trauma, Other Etiology: Wound Location: Left Hand - 4th Digit Wound Open Wounding Event: Trauma Status: Date Acquired: 12/04/2014 Comorbid Cataracts, Anemia, Type II Diabetes, Weeks Of Treatment: 9 History: End Stage Renal Disease, History of Clustered Wound: No Burn, Neuropathy Photos Photo Uploaded By: Montey Hora on 06/27/2015 16:56:02 Wound Measurements Length: (cm) 0.6 Width: (cm) 1.1 Depth: (cm) 0.1 Area: (cm) 0.518 Volume: (cm) 0.052 % Reduction in Area: 17.5% %  Reduction in Volume: 17.5% Epithelialization: None Tunneling: No Undermining: No Wound Description  Treatment: 0 History: End Stage Renal Disease, History of Clustered Wound: No Burn, Neuropathy Photos Photo Uploaded By: Montey Hora on 06/27/2015 16:56:19 Wound Measurements Length: (cm) 0.4 Width: (cm) 0.4 Depth: (cm) 0.1 Area: (cm) 0.126 Volume: (cm) 0.013 % Reduction in Area: % Reduction in Volume: Epithelialization: None Tunneling: No Undermining: No Wound Description Classification: Partial Thickness Wound Margin: Flat and Intact Exudate Amount: None Present Foul Odor After Cleansing: No Wound Bed Granulation Amount: None Present (0%) Exposed Structure Necrotic Amount: Large (67-100%) Fascia Exposed: No Necrotic Quality: Eschar Fat Layer Exposed: No Tendon Exposed: No Muscle Exposed: No Joint Exposed: No Hooley, Tonya R. ([id number]) Bone Exposed: No Limited to Skin Breakdown Periwound Skin Texture Texture Color No Abnormalities Noted: No No Abnormalities Noted: No Callus: No Atrophie Blanche: No Crepitus: No Cyanosis: No Excoriation: No Ecchymosis: No Fluctuance: No Erythema: No Friable: No Hemosiderin Staining: No Induration: No Mottled: No Localized Edema: No Pallor: No Rash: No Rubor: No Scarring: No Moisture No  Abnormalities Noted: No Dry / Scaly: No Maceration: No Moist: No Wound Preparation Ulcer Cleansing: Rinsed/Irrigated with Saline Topical Anesthetic Applied: None Treatment Notes Wound #20 (Right Hand - 5th Digit) 1. Cleansed with: Clean wound with Normal Saline 4. Dressing Applied: Other dressing (specify in notes) Notes betadine paint Electronic Signature(s) Signed: 06/27/2015 5:54:13 PM By: Montey Hora Entered By: Montey Hora on 06/27/2015 11:25:50 [name] ([id number]) -------------------------------------------------------------------------------- Vitals Details Patient Name: [name] Date of Service: 06/27/2015 11:00 AM Medical Record Number: [id number] Patient Account Number: [id number] Date of Birth/Sex: 15-Dec-1965 (49 y.o. Female) Treating RN: Montey Hora Primary Care Physician: Arnette Norris Other Clinician: Referring Physician: Arnette Norris Treating Physician/Extender: Frann Rider in Treatment: 9 Vital Signs Time Taken: 11:12 Temperature (F): 98.3 Height (in): 68 Pulse (bpm): 79 Weight (lbs): 220 Respiratory Rate (breaths/min): 18 Body Mass Index (BMI): 33.4 Blood Pressure (mmHg): 102/42 Reference Range: 80 - 120 mg / dl Electronic Signature(s) Signed: 06/27/2015 5:54:13 PM By: Montey Hora Entered By: Montey Hora on 06/27/2015 11:11:55  Callus: No Atrophie Blanche: No Crepitus: No Cyanosis: No Excoriation: No Ecchymosis: No Fluctuance: No Erythema: No Friable: No Hemosiderin Staining: No Induration: No Mottled: No Localized Edema: No Pallor: No Rash: No Rubor: No Scarring: No Temperature / Pain Moisture Temperature: No Abnormality No Abnormalities Noted: No Dry / Scaly: Yes Maceration: No Moist: No Wound Preparation Ulcer Cleansing: Rinsed/Irrigated with Saline Topical Anesthetic Applied: None Treatment Notes Wound #15 (Left Hand - 2nd  Digit) 1. Cleansed with: Clean wound with Normal Saline 4. Dressing Applied: Other dressing (specify in notes) Notes betadine paint Electronic Signature(s) Signed: 06/27/2015 5:54:13 PM By: Montey Hora Entered By: Montey Hora on 06/27/2015 11:27:39 [name] ([id number]) -------------------------------------------------------------------------------- Wound Assessment Details Patient Name: [name] Date of Service: 06/27/2015 11:00 AM Medical Record Number: [id number] Patient Account Number: [id number] Date of Birth/Sex: [birth year] (49 y.o. Female) Treating RN: Montey Hora Primary Care Physician: Arnette Norris Other Clinician: Referring Physician: Arnette Norris Treating Physician/Extender: Frann Rider in Treatment: 9 Wound Status Wound Number: 16 Primary Trauma, Other Etiology: Wound Location: Left Hand - 3rd Digit Wound Open Wounding Event: Trauma Status: Date Acquired: 12/04/2014 Comorbid Cataracts, Anemia, Type II Diabetes, Weeks Of Treatment: 9 History: End Stage Renal Disease, History of Clustered Wound: No Burn, Neuropathy Photos Photo Uploaded By: Montey Hora on 06/27/2015 16:55:11 Wound Measurements Length: (cm) 1.5 Width: (cm) 7.5 Depth: (cm) 0.1 Area: (cm) 8.836 Volume: (cm) 0.884 % Reduction in Area: -4067.9% % Reduction in Volume: -4109.5% Epithelialization: None Tunneling: No Undermining: No Wound Description Full Thickness Without Exposed Classification: Support Structures Wound Margin: Distinct, outline attached Exudate None Present Amount: Foul Odor After Cleansing: No Wound Bed Granulation Amount: None Present (0%) Exposed Structure Necrotic Amount: Large (67-100%) Fascia Exposed: No Necrotic Quality: Eschar Fat Layer Exposed: No Tendon Exposed: No Dorce, Tonya R. ([id number]) Muscle Exposed: No Joint Exposed: No Bone Exposed: No Limited to Skin Breakdown Periwound Skin Texture Texture  Color No Abnormalities Noted: No No Abnormalities Noted: No Callus: No Atrophie Blanche: No Crepitus: No Cyanosis: No Excoriation: No Ecchymosis: No Fluctuance: No Erythema: Yes Friable: No Erythema Location: Circumferential Induration: No Hemosiderin Staining: No Localized Edema: Yes Mottled: No Rash: No Pallor: No Scarring: No Rubor: No Moisture Temperature / Pain No Abnormalities Noted: No Temperature: No Abnormality Dry / Scaly: No Maceration: Yes Moist: No Wound Preparation Ulcer Cleansing: Rinsed/Irrigated with Saline Topical Anesthetic Applied: None Treatment Notes Wound #16 (Left Hand - 3rd Digit) 1. Cleansed with: Clean wound with Normal Saline 5. Secondary Dressing Applied Non-Adherent pad 7. Secured with Recruitment consultant) Signed: 06/27/2015 5:54:13 PM By: Montey Hora Entered By: Montey Hora on 06/27/2015 11:33:59 [name] ([id number]) -------------------------------------------------------------------------------- Wound Assessment Details Patient Name: [name] Date of Service: 06/27/2015 11:00 AM Medical Record Number: [id number] Patient Account Number: [id number] Date of Birth/Sex: Jan 10, 1966 (49 y.o. Female) Treating RN: Montey Hora Primary Care Physician: Arnette Norris Other Clinician: Referring Physician: Arnette Norris Treating Physician/Extender: Frann Rider in Treatment: 9 Wound Status Wound Number: 17 Primary Trauma, Other Etiology: Wound Location: Left Hand - 4th Digit Wound Open Wounding Event: Trauma Status: Date Acquired: 12/04/2014 Comorbid Cataracts, Anemia, Type II Diabetes, Weeks Of Treatment: 9 History: End Stage Renal Disease, History of Clustered Wound: No Burn, Neuropathy Photos Photo Uploaded By: Montey Hora on 06/27/2015 16:56:02 Wound Measurements Length: (cm) 0.6 Width: (cm) 1.1 Depth: (cm) 0.1 Area: (cm) 0.518 Volume: (cm) 0.052 % Reduction in Area: 17.5% %  Reduction in Volume: 17.5% Epithelialization: None Tunneling: No Undermining: No Wound Description  0.4x0.4x0.1 N/A (cm) Area (cm) : 0.628 0.126 N/A Volume (cm) : 0.063 0.013 N/A % Reduction in Area: N/A N/A N/A % Reduction in Volume: N/A N/A N/A Classification: Partial Thickness Partial Thickness N/A Exudate Amount: None Present None Present N/A Wound Margin: Flat and Intact Flat and Intact N/A Granulation Amount: None Present (0%) None Present (0%) N/A Granulation Quality: N/A N/A N/A Necrotic Amount: Large (67-100%) Large (67-100%) N/A Necrotic Tissue: Eschar Eschar N/A Exposed Structures: Fascia: No Fascia: No N/A Fat: No Fat: No Tendon: No Tendon: No Muscle: No Muscle: No Joint: No Joint:  No Bone: No Bone: No Limited to Skin Limited to Skin Breakdown Breakdown Epithelialization: None None N/A Periwound Skin Texture: Edema: No Edema: No N/A Excoriation: No Excoriation: No Induration: No Induration: No Callus: No Callus: No Crepitus: No Crepitus: No Fluctuance: No Fluctuance: No Friable: No Friable: No Rash: No Rash: No Scarring: No Scarring: No Periwound Skin Maceration: No Maceration: No N/A Moisture: Moist: No Moist: No Dry/Scaly: No Dry/Scaly: No Periwound Skin Color: Atrophie Blanche: No Atrophie Blanche: No N/A Cyanosis: No Cyanosis: No Ecchymosis: No Ecchymosis: No Erythema: No Erythema: No Hemosiderin Staining: No Hemosiderin Staining: No Mottled: No Mottled: No Pallor: No Pallor: No Rubor: No Rubor: No Erythema Location: N/A N/A N/A Temperature: N/A N/A N/A No No N/A [name], [name] ([id number]) Tenderness on Palpation: Wound Preparation: Ulcer Cleansing: Ulcer Cleansing: N/A Rinsed/Irrigated with Rinsed/Irrigated with Saline Saline Topical Anesthetic Topical Anesthetic Applied: None Applied: None Treatment Notes Electronic Signature(s) Signed: 06/27/2015 5:54:13 PM By: Montey Hora Entered By: Montey Hora on 06/27/2015 11:36:54 [name] ([id number]) -------------------------------------------------------------------------------- [place] Details Patient Name: [name] Date of Service: 06/27/2015 11:00 AM Medical Record Number: [id number] Patient Account Number: [id number] Date of Birth/Sex: [birth year] (49 y.o. Female) Treating RN: Montey Hora Primary Care Physician: Arnette Norris Other Clinician: Referring Physician: Arnette Norris Treating Physician/Extender: Frann Rider in Treatment: 9 Active Inactive Abuse / Safety / Falls / Self Care Management Nursing Diagnoses: Impaired physical mobility Potential for falls Goals: Patient will remain injury free Date  Initiated: 04/23/2015 Goal Status: Active Interventions: Assess fall risk on admission and as needed Notes: Orientation to the Wound Care Program Nursing Diagnoses: Knowledge deficit related to the wound healing center program Goals: Patient/caregiver will verbalize understanding of the [facility] Date Initiated: 04/23/2015 Goal Status: Active Interventions: Provide education on orientation to the wound center Notes: Peripheral Neuropathy Nursing Diagnoses: Potential alteration in peripheral tissue perfusion (select prior to confirmation of diagnosis) Goals: Patient/caregiver will verbalize understanding of disease process and disease management [name] ([id number]) Date Initiated: 04/23/2015 Goal Status: Active Interventions: Assess signs and symptoms of neuropathy upon admission and as needed Notes: Pressure Nursing Diagnoses: Potential for impaired tissue integrity related to pressure, friction, moisture, and shear Goals: Patient will remain free from development of additional pressure ulcers Date Initiated: 04/23/2015 Goal Status: Active Interventions: Assess offloading mechanisms upon admission and as needed Notes: Wound/Skin Impairment Nursing Diagnoses: Impaired tissue integrity Goals: Ulcer/skin breakdown will have a volume reduction of 30% by week 4 Date Initiated: 04/23/2015 Goal Status: Active Ulcer/skin breakdown will have a volume reduction of 50% by week 8 Date Initiated: 04/23/2015 Goal Status: Active Ulcer/skin breakdown will have a volume reduction of 80% by week 12 Date Initiated: 04/23/2015 Goal Status: Active Ulcer/skin breakdown will heal within 14 weeks Date Initiated: 04/23/2015 Goal Status: Active Interventions: Assess patient/caregiver ability to obtain necessary supplies Assess ulceration(s) every visit Notes: [name] ([id number]) Electronic  Treatment: 0 History: End Stage Renal Disease, History of Clustered Wound: No Burn, Neuropathy Photos Photo Uploaded By: Montey Hora on 06/27/2015 16:56:19 Wound Measurements Length: (cm) 0.4 Width: (cm) 0.4 Depth: (cm) 0.1 Area: (cm) 0.126 Volume: (cm) 0.013 % Reduction in Area: % Reduction in Volume: Epithelialization: None Tunneling: No Undermining: No Wound Description Classification: Partial Thickness Wound Margin: Flat and Intact Exudate Amount: None Present Foul Odor After Cleansing: No Wound Bed Granulation Amount: None Present (0%) Exposed Structure Necrotic Amount: Large (67-100%) Fascia Exposed: No Necrotic Quality: Eschar Fat Layer Exposed: No Tendon Exposed: No Muscle Exposed: No Joint Exposed: No Hooley, Tonya R. ([id number]) Bone Exposed: No Limited to Skin Breakdown Periwound Skin Texture Texture Color No Abnormalities Noted: No No Abnormalities Noted: No Callus: No Atrophie Blanche: No Crepitus: No Cyanosis: No Excoriation: No Ecchymosis: No Fluctuance: No Erythema: No Friable: No Hemosiderin Staining: No Induration: No Mottled: No Localized Edema: No Pallor: No Rash: No Rubor: No Scarring: No Moisture No  Abnormalities Noted: No Dry / Scaly: No Maceration: No Moist: No Wound Preparation Ulcer Cleansing: Rinsed/Irrigated with Saline Topical Anesthetic Applied: None Treatment Notes Wound #20 (Right Hand - 5th Digit) 1. Cleansed with: Clean wound with Normal Saline 4. Dressing Applied: Other dressing (specify in notes) Notes betadine paint Electronic Signature(s) Signed: 06/27/2015 5:54:13 PM By: Montey Hora Entered By: Montey Hora on 06/27/2015 11:25:50 [name] ([id number]) -------------------------------------------------------------------------------- Vitals Details Patient Name: [name] Date of Service: 06/27/2015 11:00 AM Medical Record Number: [id number] Patient Account Number: [id number] Date of Birth/Sex: 15-Dec-1965 (49 y.o. Female) Treating RN: Montey Hora Primary Care Physician: Arnette Norris Other Clinician: Referring Physician: Arnette Norris Treating Physician/Extender: Frann Rider in Treatment: 9 Vital Signs Time Taken: 11:12 Temperature (F): 98.3 Height (in): 68 Pulse (bpm): 79 Weight (lbs): 220 Respiratory Rate (breaths/min): 18 Body Mass Index (BMI): 33.4 Blood Pressure (mmHg): 102/42 Reference Range: 80 - 120 mg / dl Electronic Signature(s) Signed: 06/27/2015 5:54:13 PM By: Montey Hora Entered By: Montey Hora on 06/27/2015 11:11:55  Callus: No Atrophie Blanche: No Crepitus: No Cyanosis: No Excoriation: No Ecchymosis: No Fluctuance: No Erythema: No Friable: No Hemosiderin Staining: No Induration: No Mottled: No Localized Edema: No Pallor: No Rash: No Rubor: No Scarring: No Temperature / Pain Moisture Temperature: No Abnormality No Abnormalities Noted: No Dry / Scaly: Yes Maceration: No Moist: No Wound Preparation Ulcer Cleansing: Rinsed/Irrigated with Saline Topical Anesthetic Applied: None Treatment Notes Wound #15 (Left Hand - 2nd  Digit) 1. Cleansed with: Clean wound with Normal Saline 4. Dressing Applied: Other dressing (specify in notes) Notes betadine paint Electronic Signature(s) Signed: 06/27/2015 5:54:13 PM By: Montey Hora Entered By: Montey Hora on 06/27/2015 11:27:39 [name] ([id number]) -------------------------------------------------------------------------------- Wound Assessment Details Patient Name: [name] Date of Service: 06/27/2015 11:00 AM Medical Record Number: [id number] Patient Account Number: [id number] Date of Birth/Sex: [birth year] (49 y.o. Female) Treating RN: Montey Hora Primary Care Physician: Arnette Norris Other Clinician: Referring Physician: Arnette Norris Treating Physician/Extender: Frann Rider in Treatment: 9 Wound Status Wound Number: 16 Primary Trauma, Other Etiology: Wound Location: Left Hand - 3rd Digit Wound Open Wounding Event: Trauma Status: Date Acquired: 12/04/2014 Comorbid Cataracts, Anemia, Type II Diabetes, Weeks Of Treatment: 9 History: End Stage Renal Disease, History of Clustered Wound: No Burn, Neuropathy Photos Photo Uploaded By: Montey Hora on 06/27/2015 16:55:11 Wound Measurements Length: (cm) 1.5 Width: (cm) 7.5 Depth: (cm) 0.1 Area: (cm) 8.836 Volume: (cm) 0.884 % Reduction in Area: -4067.9% % Reduction in Volume: -4109.5% Epithelialization: None Tunneling: No Undermining: No Wound Description Full Thickness Without Exposed Classification: Support Structures Wound Margin: Distinct, outline attached Exudate None Present Amount: Foul Odor After Cleansing: No Wound Bed Granulation Amount: None Present (0%) Exposed Structure Necrotic Amount: Large (67-100%) Fascia Exposed: No Necrotic Quality: Eschar Fat Layer Exposed: No Tendon Exposed: No Dorce, Tonya R. ([id number]) Muscle Exposed: No Joint Exposed: No Bone Exposed: No Limited to Skin Breakdown Periwound Skin Texture Texture  Color No Abnormalities Noted: No No Abnormalities Noted: No Callus: No Atrophie Blanche: No Crepitus: No Cyanosis: No Excoriation: No Ecchymosis: No Fluctuance: No Erythema: Yes Friable: No Erythema Location: Circumferential Induration: No Hemosiderin Staining: No Localized Edema: Yes Mottled: No Rash: No Pallor: No Scarring: No Rubor: No Moisture Temperature / Pain No Abnormalities Noted: No Temperature: No Abnormality Dry / Scaly: No Maceration: Yes Moist: No Wound Preparation Ulcer Cleansing: Rinsed/Irrigated with Saline Topical Anesthetic Applied: None Treatment Notes Wound #16 (Left Hand - 3rd Digit) 1. Cleansed with: Clean wound with Normal Saline 5. Secondary Dressing Applied Non-Adherent pad 7. Secured with Recruitment consultant) Signed: 06/27/2015 5:54:13 PM By: Montey Hora Entered By: Montey Hora on 06/27/2015 11:33:59 [name] ([id number]) -------------------------------------------------------------------------------- Wound Assessment Details Patient Name: [name] Date of Service: 06/27/2015 11:00 AM Medical Record Number: [id number] Patient Account Number: [id number] Date of Birth/Sex: Jan 10, 1966 (49 y.o. Female) Treating RN: Montey Hora Primary Care Physician: Arnette Norris Other Clinician: Referring Physician: Arnette Norris Treating Physician/Extender: Frann Rider in Treatment: 9 Wound Status Wound Number: 17 Primary Trauma, Other Etiology: Wound Location: Left Hand - 4th Digit Wound Open Wounding Event: Trauma Status: Date Acquired: 12/04/2014 Comorbid Cataracts, Anemia, Type II Diabetes, Weeks Of Treatment: 9 History: End Stage Renal Disease, History of Clustered Wound: No Burn, Neuropathy Photos Photo Uploaded By: Montey Hora on 06/27/2015 16:56:02 Wound Measurements Length: (cm) 0.6 Width: (cm) 1.1 Depth: (cm) 0.1 Area: (cm) 0.518 Volume: (cm) 0.052 % Reduction in Area: 17.5% %  Reduction in Volume: 17.5% Epithelialization: None Tunneling: No Undermining: No Wound Description

## 2015-06-29 LAB — GLUCOSE, CAPILLARY
GLUCOSE-CAPILLARY: 152 mg/dL — AB (ref 65–99)
GLUCOSE-CAPILLARY: 160 mg/dL — AB (ref 65–99)
GLUCOSE-CAPILLARY: 125 mg/dL — AB (ref 65–99)
Glucose-Capillary: 127 mg/dL — ABNORMAL HIGH (ref 65–99)
Glucose-Capillary: 81 mg/dL (ref 65–99)

## 2015-06-29 LAB — HEMOGLOBIN A1C
HEMOGLOBIN A1C: 7.5 % — AB (ref 4.8–5.6)
MEAN PLASMA GLUCOSE: 169 mg/dL

## 2015-06-29 LAB — HEPATITIS B SURFACE ANTIGEN: Hepatitis B Surface Ag: NEGATIVE

## 2015-06-29 MED ORDER — PROMETHAZINE HCL 25 MG/ML IJ SOLN: 6.2500 mg | INTRAMUSCULAR | Status: DC | PRN

## 2015-06-29 MED ORDER — SODIUM CHLORIDE 0.9 % IV BOLUS (SEPSIS): 500.0000 mL | Freq: Once | INTRAVENOUS | Status: DC

## 2015-06-29 MED ORDER — SODIUM CHLORIDE 0.9 % IV SOLN: INTRAVENOUS | Status: DC

## 2015-06-29 MED ORDER — FENTANYL CITRATE (PF) 100 MCG/2ML IJ SOLN: 25.0000 ug | INTRAMUSCULAR | Status: DC | PRN

## 2015-06-29 NOTE — Progress Notes (Signed)
[facility] Progress Note  Assessment/Plan: 1. 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 ESRD - MWF -- net UF yesterday 3.1 with post weight 106.8- had hourly heparin yesterday post op 3. Hypotension/volume - has chronic low BP and generally high IDWG, but not so much lately - even leaving below EDW- midodrine for BP support pre and mid HD treatment 4. Anemia - Hgb 12 outpt to 11. 5 - no ESA or Fe for months depsite low tsat and even ferritin of 123, which is low considering chronic wounds- follow Hgb 5. Metabolic bone disease - P runs high - ? Diet +/- c/w binders- P here 3.4 with hospital diet and binders !!! Calcitriol just increased. Continue sensipar 60 and 5 renvela ac 6. Nutrition - change to renal carb mod diet due to ^ K content of heart healthy diet/vitamin 7. DM - per primary 8. 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 wound clinic- care as outlined by RN 9. Disp - possible d/c tomorrow 10. 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 Diarrhea vs loose stools - still with fevers - need to quantify - high risk for Cdiff - on clindamycin prior to admission   Myriam Jacobson, PA-C [place] [phone] 06/29/2015,10:32 AM  LOS: 2 days   I have seen and examined this patient and agree with plan and assessment in the above note of MBergman. HD yesterday. BP stable low. On midodrine. Current ATB's vanco/aztreonam/flagyl. Low grade temp 99's.  WBC normal, BC's neg to date.  Will need to decide what outpt ATB's - vancomycin/fortaz/ancef/aminoglycosides all available at outpt HD unit. Adison Reifsteck B,MD 06/29/2015 11:53  AM  Subjective:   Frequent stools - some very loose, some formed. C/o getting renvela 2 hours after breakfast - save it to eat with biscuit her husband brought her. Feels like they didn't take enough fluid off yesterday  Objective Filed Vitals:   06/29/15 0600 06/29/15 0700 06/29/15 0800 06/29/15 0824  BP: 95/40 101/23 97/29 97/29   Pulse: 44 62 54 62  Temp:    99 F (37.2 C)  TempSrc:    Oral  Resp: 20 18 19 23   Height:      Weight:      SpO2: 98% 97% 97% 99%   Physical Exam General: ill appearing, but at baseline Heart: RRR Lungs: No rales Abdomen: obese soft NT Extremities: thin atrophic LE with mild edema, s/p multiple toe amp/part foot amp; left hand unwrapped - sutures intact , still with drainage- erthema, middle finger- swollen Dialysis Access:  Left thigh AVGG + bruit  Dialysis Orders: [facility] 5 hr MWF 180 400/800 EDW 108 - leaving below 107.5 or lower 2 K 2 Ca no profile Left thigh graft heparin 10000 with 7500 mid treatment (VERY HIGH) calcitriol 0.75 just increased. No ESA or Fe Recent labs: Hgb 12.2 11% sat ferritin 123 (surprisingly low) iPTH 796   Additional Objective Labs: Basic Metabolic Panel:  Recent Labs Lab 06/27/15 1414 06/28/15 0540 06/28/15 2106  NA 137  --  139  K 4.6  --  3.4*  CL 89*  --  95*  CO2 27  --  30  GLUCOSE 286*  --  129*  BUN 36*  --  12  CREATININE 5.71*  --  2.84*  CALCIUM 9.1  --  8.8*  PHOS  --  6.9* 3.4   Liver Function Tests:  Recent Labs Lab 06/27/15 1414 06/28/15 2106  AST 25  --   ALT 20  --   ALKPHOS 284*  --   BILITOT 0.9  --   PROT 6.9  --   ALBUMIN 3.1* 2.8*  CBC:  Recent Labs Lab 06/27/15 1414 06/28/15 2106  WBC 10.2 7.7  NEUTROABS 8.4*  --   HGB 12.8 11.5*  HCT 43.6 39.6  MCV 94.2 93.2  PLT 204 163   Blood Culture    Component Value Date/Time   SDES BLOOD LEFT ANTECUBITAL 06/27/2015 1645   SPECREQUEST BOTTLES DRAWN AEROBIC AND ANAEROBIC 5CC 06/27/2015 1645   CULT NO GROWTH < 24 HOURS  06/27/2015 1645   REPTSTATUS PENDING 06/27/2015 1645    Cardiac Enzymes:  Recent Labs Lab 06/27/15 1645  TROPONINI <0.03   CBG:  Recent Labs Lab 06/27/15 2204 06/28/15 0820 06/28/15 1204 06/28/15 2240 06/29/15 0749  GLUCAP 158* 348* 210* 193* 152*   Studies/Results: Mr Foot Right Wo Contrast  06/28/2015  CLINICAL DATA:  Foot ulceration of the third toe in a diabetic patient. History of prior amputations. Initial encounter. EXAM: MRI OF THE RIGHT FOREFOOT WITHOUT CONTRAST TECHNIQUE: Multiplanar, multisequence MR imaging was performed. No intravenous contrast was administered. COMPARISON:  Outside plain films of the right foot 05/21/2015. FINDINGS: Amputation at the level of the base of the first metatarsal and midshaft of the second metatarsal is seen as on the comparison plain films. There is no bone marrow signal abnormality or fluid collection about the third toe to suggest osteomyelitis or abscess. Mild subcutaneous edema is present over the dorsum of the foot. Marrow edema is seen at the base of the fifth metatarsal with an associated T1 hypo intense line consistent with nondisplaced fracture. Marrow edema is also seen in the calcaneus at the calcaneocuboid joint and base of the first metatarsal remnant most consistent with degenerative disease. No mass is identified. Intrinsic musculature of the foot is fatty replaced. IMPRESSION: Negative for osteomyelitis or abscess. Subacute appearing nondisplaced fracture base of the fifth metatarsal. Status post amputation at the base of first metatarsal and midshaft of the second metatarsal. Electronically Signed   By: Inge Rise M.D.   On: 06/28/2015 07:19   Dg Chest Portable 1 View  06/27/2015  CLINICAL DATA:  Chest pain for 1 day EXAM: PORTABLE CHEST 1 VIEW COMPARISON:  12/21/2014 FINDINGS: Moderate cardiomegaly. Lungs under aerated and grossly clear. No pneumothorax. No pleural effusion. IMPRESSION: Cardiomegaly without  decompensation. Electronically Signed   By: Marybelle Killings M.D.   On: 06/27/2015 16:16   Dg Finger Middle Left  06/27/2015  CLINICAL DATA:  Infected left middle finger wound for 1 week. EXAM: LEFT MIDDLE FINGER 2+V COMPARISON:  04/25/2015 FINDINGS: There is a comminuted fracture of the distal left third phalanx, with complete separation of the 2 fracture fragments. The third distal interphalangeal joint is disrupted with the proximal shaft of the distal third phalanx situated within the palmar soft tissues proximal to the DIP joint, and the tip of the third distal phalanx within the dorsal soft tissues at the level of the DIP joint. There is severe soft tissue swelling and soft tissue emphysema. Amputation  of the second distal phalanx as seen. IMPRESSION: Comminuted fracture of the distal third phalanx with complete separation of the 2 fracture fragments, and severe dislocation at the DIP joint. Severe soft tissue swelling and soft tissue emphysema. Electronically Signed   By: Fidela Salisbury M.D.   On: 06/27/2015 15:57   Medications:   . acidophilus  1 capsule Oral QHS  . aspirin EC  81 mg Oral QHS  . aztreonam  1 g Intravenous Q24H  . calcitRIOL  0.75 mcg Oral Q M,W,F-HD  . cinacalcet  60 mg Oral Q breakfast  . gabapentin  100 mg Oral QHS  . gabapentin  200 mg Oral Daily  . heparin  5,000 Units Subcutaneous 3 times per day  . insulin aspart  20 Units Subcutaneous TID WC  . insulin glargine  50 Units Subcutaneous QHS  . metronidazole  500 mg Intravenous Q8H  . midodrine  20 mg Oral TID WC  . multivitamin  1 tablet Oral QHS  . pantoprazole  40 mg Oral Daily  . pneumococcal 23 valent vaccine  0.5 mL Intramuscular Tomorrow-1000  . sevelamer carbonate  4,000 mg Oral TID WC  . silver sulfADIAZINE   Topical Daily  . vancomycin  1,000 mg Intravenous Q M,W,F-HD

## 2015-06-29 NOTE — Progress Notes (Signed)
[facility] PROGRESS NOTE   [name] VVO:[id number] DOB: [birth year] DOA: 06/27/2015 PCP: Arnette Norris, MD  HPI/Subjective: Denies any fever or chills, had dialysis yesterday. Feels she still has some fluid around her waist. Complains about watery diarrhea likely antibiotics related.  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 Patient with a lactic acid initially of 5.02 with a fever of 101F. Plastic surgery hand was consult in the ED and will likely take patient for amputation once medically stable to do so. -Blood cultures 2 -Patient is on vancomycin, aztreonam and Flagyl continue current medications. -Incision and drainage done by Dr. Lenon Curt for the left third digit.  Diabetic foot ulcer  -Patient was previously seen by infectious disease on 06/13/2015 -MRI without contrast of the right foot showed no evidence of osteomyelitis. -Continue IV antibiotics for now.  End stage renal failure on dialysis [facility]): Patient dialyzed M/W/F -Consulted nephrology to continue patient dialysis tomorrow consult called to Dr. 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 Zosyn was placed on patient's allergy list as it appears that she had an respiratory distress after receiving this medicine.   History of amputations    MORBID OBESITY: Stable  Dependent for wheelchair mobility  Code Status: Full Code Family Communication: Plan discussed with the patient. Disposition Plan: Remains inpatient Diet: Diet renal/carb modified with fluid restriction Diet-HS Snack?: Nothing; Room service appropriate?: Yes; Fluid consistency:: Thin  Consultants:  nephrology  Procedures:  None  Antibiotics:  Vancomycin, aztreonam and Flagyl.   Objective: Filed Vitals:   06/29/15 0824  BP: 97/29  Pulse: 62  Temp: 99 F (37.2 C)  Resp: 23    Intake/Output Summary (Last 24 hours) at 06/29/15 1049 Last data filed at 06/29/15 0600  Gross per 24 hour  Intake   1090 ml  Output   3100 ml  Net  -2010 ml   Filed Weights   06/28/15 1430 06/28/15 1945 06/29/15 0500  Weight: 109.9 kg (242 lb 4.6 oz) 106.8 kg (235 lb 7.2 oz) 109.7 kg (241 lb 13.5 oz)    Exam: General: Alert and awake, oriented x3, not in any acute distress. HEENT: anicteric sclera, pupils reactive to light and accommodation, EOMI CVS: S1-S2 clear, no murmur rubs or gallops Chest: clear to auscultation bilaterally, no wheezing, rales or rhonchi Abdomen: soft nontender, nondistended, normal bowel sounds, no organomegaly Extremities: no cyanosis, clubbing or edema noted bilaterally Neuro: Cranial nerves II-XII intact, no focal neurological deficits  Data Reviewed: Basic Metabolic Panel:  Recent Labs Lab 06/27/15 1414 06/28/15 0540 06/28/15 2106  NA 137  --  139  K  4.6  --  3.4*  CL 89*  --  95*  CO2 27  --  30  GLUCOSE 286*  --  129*  BUN 36*  --  12  CREATININE 5.71*  --  2.84*  CALCIUM 9.1  --  8.8*  PHOS  --  6.9* 3.4   Liver Function Tests:  Recent Labs Lab  06/27/15 1414 06/28/15 2106  AST 25  --   ALT 20  --   ALKPHOS 284*  --   BILITOT 0.9  --   PROT 6.9  --   ALBUMIN 3.1* 2.8*   No results for input(s): LIPASE, AMYLASE in the last 168 hours. No results for input(s): AMMONIA in the last 168 hours. CBC:  Recent Labs Lab 06/27/15 1414 06/28/15 2106  WBC 10.2 7.7  NEUTROABS 8.4*  --   HGB 12.8 11.5*  HCT 43.6 39.6  MCV 94.2 93.2  PLT 204 163   Cardiac Enzymes:  Recent Labs Lab 06/27/15 1645  TROPONINI <0.03   BNP (last 3 results) No results for input(s): BNP in the last 8760 hours.  ProBNP (last 3 results) No results for input(s): PROBNP in the last 8760 hours.  CBG:  Recent Labs Lab 06/27/15 2204 06/28/15 0820 06/28/15 1204 06/28/15 2240 06/29/15 0749  GLUCAP 158* 348* 210* 193* 152*    Micro Recent Results (from the past 240 hour(s))  Blood Culture (routine x 2)     Status: None (Preliminary result)   Collection Time: 06/27/15  3:05 PM  Result Value Ref Range Status   Specimen Description BLOOD RIGHT FOREARM  Final   Special Requests BOTTLES DRAWN AEROBIC AND ANAEROBIC 5CC  Final   Culture NO GROWTH < 24 HOURS  Final   Report Status PENDING  Incomplete  Blood Culture (routine x 2)     Status: None (Preliminary result)   Collection Time: 06/27/15  4:45 PM  Result Value Ref Range Status   Specimen Description BLOOD LEFT ANTECUBITAL  Final   Special Requests BOTTLES DRAWN AEROBIC AND ANAEROBIC 5CC  Final   Culture NO GROWTH < 24 HOURS  Final   Report Status PENDING  Incomplete  Surgical pcr screen     Status: None   Collection Time: 06/27/15  7:27 PM  Result Value Ref Range Status   MRSA, PCR NEGATIVE NEGATIVE Final   Staphylococcus aureus NEGATIVE NEGATIVE Final    Comment:        The Xpert SA Assay (FDA approved for NASAL specimens in patients over 50 years of age), is one component of a comprehensive surveillance program.  Test performance has been validated by [facility] for patients  greater than or equal to 36 year old. It is not intended to diagnose infection nor to guide or monitor treatment.      Studies: Mr Foot Right Wo Contrast  06/28/2015  CLINICAL DATA:  Foot ulceration of the third toe in a diabetic patient. History of prior amputations. Initial encounter. EXAM: MRI OF THE RIGHT FOREFOOT WITHOUT CONTRAST TECHNIQUE: Multiplanar, multisequence MR imaging was performed. No intravenous contrast was administered. COMPARISON:  Outside plain films of the right foot 05/21/2015. FINDINGS: Amputation at the level of the base of the first metatarsal and midshaft of the second metatarsal is seen as on the comparison plain films. There is no bone marrow signal abnormality or fluid collection about the third toe to suggest osteomyelitis or abscess. Mild subcutaneous edema is present over the dorsum of the foot. Marrow edema is seen at  the base of the fifth metatarsal with an associated T1 hypo intense line consistent with nondisplaced fracture. Marrow edema is also seen in the calcaneus at the calcaneocuboid joint and base of the first metatarsal remnant most consistent with degenerative disease. No mass is identified. Intrinsic musculature of the foot is fatty replaced. IMPRESSION: Negative for osteomyelitis or abscess. Subacute appearing nondisplaced fracture base of the fifth metatarsal. Status post amputation at the base of first metatarsal and midshaft of the second metatarsal. Electronically Signed   By: Inge Rise M.D.   On: 06/28/2015 07:19   Dg Chest Portable 1 View  06/27/2015  CLINICAL DATA:  Chest pain for 1 day EXAM: PORTABLE CHEST 1 VIEW COMPARISON:  12/21/2014 FINDINGS: Moderate cardiomegaly. Lungs under aerated and grossly clear. No pneumothorax. No pleural effusion. IMPRESSION: Cardiomegaly without decompensation. Electronically Signed   By: Marybelle Killings M.D.   On: 06/27/2015 16:16   Dg Finger Middle Left  06/27/2015  CLINICAL DATA:  Infected left middle  finger wound for 1 week. EXAM: LEFT MIDDLE FINGER 2+V COMPARISON:  04/25/2015 FINDINGS: There is a comminuted fracture of the distal left third phalanx, with complete separation of the 2 fracture fragments. The third distal interphalangeal joint is disrupted with the proximal shaft of the distal third phalanx situated within the palmar soft tissues proximal to the DIP joint, and the tip of the third distal phalanx within the dorsal soft tissues at the level of the DIP joint. There is severe soft tissue swelling and soft tissue emphysema. Amputation of the second distal phalanx as seen. IMPRESSION: Comminuted fracture of the distal third phalanx with complete separation of the 2 fracture fragments, and severe dislocation at the DIP joint. Severe soft tissue swelling and soft tissue emphysema. Electronically Signed   By: Fidela Salisbury M.D.   On: 06/27/2015 15:57    Scheduled Meds: . acidophilus  1 capsule Oral QHS  . aspirin EC  81 mg Oral QHS  . aztreonam  1 g Intravenous Q24H  . calcitRIOL  0.75 mcg Oral Q M,W,F-HD  . cinacalcet  60 mg Oral Q breakfast  . gabapentin  100 mg Oral QHS  . gabapentin  200 mg Oral Daily  . heparin  5,000 Units Subcutaneous 3 times per day  . insulin aspart  20 Units Subcutaneous TID WC  . insulin glargine  50 Units Subcutaneous QHS  . metronidazole  500 mg Intravenous Q8H  . midodrine  20 mg Oral TID WC  . multivitamin  1 tablet Oral QHS  . pantoprazole  40 mg Oral Daily  . pneumococcal 23 valent vaccine  0.5 mL Intramuscular Tomorrow-1000  . sevelamer carbonate  4,000 mg Oral TID WC  . silver sulfADIAZINE   Topical Daily  . vancomycin  1,000 mg Intravenous Q M,W,F-HD   Continuous Infusions:       Time spent: 35 minutes    [facility]  [facility] Pager [phone] If 7PM-7AM, please contact night-coverage at [email or web address], password [facility] 06/29/2015, 10:49 AM  LOS: 2 days

## 2015-06-29 NOTE — Progress Notes (Signed)
S: no pain hand, states nursing did not change dressing 3x yesterday!  O:Blood pressure 97/29, pulse 62, temperature 99 F (37.2 C), temperature source Oral, resp. rate 23, height 5\' 7"  (1.702 m), weight 109.7 kg (241 lb 13.5 oz), SpO2 99 %.  LLF/palm still with erythema, + purulent drainage   A:s/p tip amputation LLF, I&D   P:imperative that dressing changes and "milking" of finger be performed at least TID for now, cont abx, dressing changes and milking of finger demonstrated to pt's husband, ok to f/u with wound care for wound monitoring 1-2 wk, would cont abx with dialysis for now

## 2015-06-30 LAB — RENAL FUNCTION PANEL
ALBUMIN: 2.5 g/dL — AB (ref 3.5–5.0)
ANION GAP: 16 — AB (ref 5–15)
BUN: 38 mg/dL — ABNORMAL HIGH (ref 6–20)
CHLORIDE: 95 mmol/L — AB (ref 101–111)
CO2: 26 mmol/L (ref 22–32)
Calcium: 8.4 mg/dL — ABNORMAL LOW (ref 8.9–10.3)
Creatinine, Ser: 6.26 mg/dL — ABNORMAL HIGH (ref 0.44–1.00)
GFR calc Af Amer: 8 mL/min — ABNORMAL LOW (ref >60)
GFR calc non Af Amer: 7 mL/min — ABNORMAL LOW (ref >60)
GLUCOSE: 125 mg/dL — AB (ref 65–99)
PHOSPHORUS: 6.7 mg/dL — AB (ref 2.5–4.6)
POTASSIUM: 4.1 mmol/L (ref 3.5–5.1)
Sodium: 137 mmol/L (ref 135–145)

## 2015-06-30 LAB — GLUCOSE, CAPILLARY
GLUCOSE-CAPILLARY: 123 mg/dL — AB (ref 65–99)
GLUCOSE-CAPILLARY: 88 mg/dL (ref 65–99)
GLUCOSE-CAPILLARY: 175 mg/dL — AB (ref 65–99)
Glucose-Capillary: 140 mg/dL — ABNORMAL HIGH (ref 65–99)

## 2015-06-30 LAB — CBC
HEMATOCRIT: 39.4 % (ref 36.0–46.0)
HEMOGLOBIN: 11.2 g/dL — AB (ref 12.0–15.0)
MCH: 26.9 pg (ref 26.0–34.0)
MCHC: 28.4 g/dL — AB (ref 30.0–36.0)
MCV: 94.5 fL (ref 78.0–100.0)
Platelets: 179 10*3/uL (ref 150–400)
RBC: 4.17 MIL/uL (ref 3.87–5.11)
RDW: 18 % — ABNORMAL HIGH (ref 11.5–15.5)
WBC: 9.4 10*3/uL (ref 4.0–10.5)

## 2015-06-30 MED ORDER — CAMPHOR-MENTHOL 0.5-0.5 % EX LOTN
TOPICAL_LOTION | CUTANEOUS | Status: DC | PRN
  Filled 2015-06-30: qty 222

## 2015-06-30 MED ORDER — INSULIN ASPART 100 UNIT/ML ~~LOC~~ SOLN
8.0000 [IU] | Freq: Once | SUBCUTANEOUS | Status: AC
  Administered 2015-06-30: 8 [IU] via SUBCUTANEOUS

## 2015-06-30 NOTE — Progress Notes (Signed)
[facility] PROGRESS NOTE   [name] YCX:[id number] DOB: 07/02/1966 DOA: 06/27/2015 PCP: Arnette Norris, MD  HPI/Subjective: Feels much better, will transfer to telemetry. Chronic hypotension. No cultures from the wound I&D, will probably do vancomycin with HD and oral Cipro for 10 more days.  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 Patient with a lactic acid initially of 5.02 with a fever of 101F. Plastic surgery hand was consult in the ED and will likely take patient for amputation once medically stable to do so. -Blood cultures 2 -Patient is on vancomycin, aztreonam and Flagyl continue current medications. -Incision and drainage done by Dr. Lenon Curt for the left third digit. -Continue dressing changes, likely to be discharged after dialysis tomorrow  Diabetic foot ulcer  -Patient was previously seen by infectious disease on 06/13/2015 -MRI without contrast of the right foot showed no evidence of osteomyelitis. -Continue IV antibiotics for now.  End stage renal failure on dialysis [facility]): Patient dialyzed M/W/F -Nephrology seeing the patient.  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 Zosyn was placed on patient's allergy list as it appears that she had an respiratory distress after receiving this medicine.   History of amputations    MORBID OBESITY: Stable  Dependent for wheelchair mobility  Code Status: Full Code Family Communication: Plan discussed with the patient. Disposition Plan: Remains inpatient Diet: Diet renal/carb modified with fluid restriction Diet-HS Snack?: Nothing; Room service appropriate?: Yes; Fluid consistency:: Thin  Consultants:  nephrology  Procedures:  None  Antibiotics:  Vancomycin, aztreonam and Flagyl.   Objective: Filed Vitals:   06/30/15 0810  BP: 102/36  Pulse: 43  Temp: 98.4 F (36.9 C)  Resp: 16   No intake or output data in the 24 hours ending 06/30/15 1145 Filed Weights   06/28/15 1945 06/29/15 0500 06/30/15 0434  Weight: 106.8 kg (235 lb 7.2 oz) 109.7 kg (241 lb 13.5 oz) 110 kg (242 lb 8.1 oz)    Exam: General: Alert and awake, oriented x3, not in any acute distress. HEENT: anicteric sclera, pupils reactive to light and accommodation, EOMI CVS: S1-S2 clear, no murmur rubs or gallops Chest: clear to auscultation bilaterally, no wheezing, rales or rhonchi Abdomen: soft nontender, nondistended, normal bowel sounds, no organomegaly Extremities: no cyanosis, clubbing or edema noted bilaterally Neuro: Cranial nerves II-XII intact, no focal neurological deficits  Data Reviewed: Basic Metabolic Panel:  Recent Labs Lab 06/27/15 1414 06/28/15 0540 06/28/15 2106 06/30/15 0331  NA 137  --  139 137  K 4.6  --  3.4* 4.1  CL 89*  --  95* 95*  CO2 27  --  30 26  GLUCOSE 286*  --  129* 125*  BUN 36*  --  12 38*  CREATININE 5.71*  --  2.84* 6.26*  CALCIUM 9.1  --  8.8* 8.4*  PHOS  --  6.9* 3.4 6.7*   Liver Function Tests:  Recent Labs Lab 06/27/15 1414 06/28/15 2106  06/30/15 0331  AST 25  --   --   ALT 20  --   --   ALKPHOS 284*  --   --   BILITOT 0.9  --   --   PROT 6.9  --   --   ALBUMIN 3.1* 2.8* 2.5*   No results for input(s): LIPASE, AMYLASE in the last 168 hours. No results for input(s): AMMONIA in the last 168 hours. CBC:  Recent Labs Lab 06/27/15 1414 06/28/15 2106 06/30/15 0331  WBC 10.2 7.7 9.4  NEUTROABS 8.4*  --   --   HGB 12.8 11.5* 11.2*  HCT 43.6 39.6 39.4  MCV 94.2 93.2 94.5  PLT 204 163 179   Cardiac Enzymes:  Recent Labs Lab 06/27/15 1645  TROPONINI <0.03   BNP (last 3 results) No results for input(s): BNP in the last 8760 hours.  ProBNP (last 3 results) No results for input(s): PROBNP in the last 8760 hours.  CBG:  Recent Labs Lab 06/29/15 0749 06/29/15 1223 06/29/15 1707 06/29/15 1940 06/29/15 2133  GLUCAP 152* 127* 81 160* 125*    Micro Recent Results (from the past 240 hour(s))  Blood Culture (routine x 2)     Status: None (Preliminary result)   Collection Time: 06/27/15  3:05 PM  Result Value Ref Range Status   Specimen Description BLOOD RIGHT FOREARM  Final   Special Requests BOTTLES DRAWN AEROBIC AND ANAEROBIC 5CC  Final   Culture NO GROWTH 3 DAYS  Final   Report Status PENDING  Incomplete  Blood Culture (routine x 2)     Status: None (Preliminary result)   Collection Time: 06/27/15  4:45 PM  Result Value Ref Range Status   Specimen Description BLOOD LEFT ANTECUBITAL  Final   Special Requests BOTTLES DRAWN AEROBIC AND ANAEROBIC 5CC  Final   Culture NO GROWTH 3 DAYS  Final   Report Status PENDING  Incomplete  Surgical pcr screen     Status: None   Collection Time: 06/27/15  7:27 PM  Result Value Ref Range Status   MRSA, PCR NEGATIVE NEGATIVE Final   Staphylococcus aureus NEGATIVE NEGATIVE Final    Comment:        The Xpert SA Assay (FDA approved for NASAL specimens in patients over 50 years of age), is one component of a comprehensive surveillance program.  Test performance  has been validated by [facility] for patients greater than or equal to 93 year old. It is not intended to diagnose infection nor to guide or monitor treatment.      Studies: No results found.  Scheduled Meds: . acidophilus  1 capsule Oral QHS  . aspirin EC  81 mg Oral QHS  . aztreonam  1 g Intravenous Q24H  . calcitRIOL  0.75 mcg Oral Q M,W,F-HD  . cinacalcet  60 mg Oral Q breakfast  . gabapentin  100 mg Oral QHS  . gabapentin  200 mg Oral Daily  . heparin  5,000 Units Subcutaneous 3 times per day  . insulin aspart  20 Units Subcutaneous TID WC  . insulin glargine  50 Units Subcutaneous QHS  . metronidazole  500 mg Intravenous Q8H  .  midodrine  20 mg Oral TID WC  . multivitamin  1 tablet Oral QHS  . pantoprazole  40 mg Oral Daily  . pneumococcal 23 valent vaccine  0.5 mL Intramuscular Tomorrow-1000  . sevelamer carbonate  4,000 mg Oral TID WC  . silver sulfADIAZINE   Topical Daily  . sodium chloride  500 mL Intravenous Once  . vancomycin  1,000 mg Intravenous Q M,W,F-HD   Continuous Infusions:       Time spent: 35 minutes    [facility] A  [facility] Pager [phone] If 7PM-7AM, please contact night-coverage at [email or web address], password [facility] 06/30/2015, 11:45 AM  LOS: 3 days

## 2015-06-30 NOTE — Progress Notes (Addendum)
[facility] Progress Note  EVENTS: Frustrated about the lack of protein on the renal diet Says that she is to go home after HD tomorrow Indicates that they are "milking" her hand TID - getting blood and pus (still erythema of palm with purulent drainage) Says was told by ortho that her F/U should be with wound care (Dr. Matthias Hughs at [facility]) Still no recommendation re: ATB's for discharge vancomycin/fortaz/ancef/aminoglycosides all available at outpt HD unit but cannot get IV flagyl or IV aztreonam.  Assessment/Plan: 1. 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 100.2 - I don't see that wound cultures were done - blood cultures were negative and still need to decide on post HD antibiotics. Vancomycin/fortaz/ancef/aminoglycosides all available at outpt HD unit. She is to F/U with wound care, do TID "milking of hand" + dressing changes. 2. ESRD - MWF -- net UF Friday 3.1 with post weight 106.8- had hourly heparin with last HD post op. First round HD tomorrow. 3. Hypotension/volume - has chronic low BP and generally high IDWG, but not so much lately - even leaving below EDW- midodrine for BP support pre and mid HD treatment 4. Anemia - Hgb 12 outpt to 11.2 - no ESA or Fe for months despite low tsat and even ferritin of 123, which is low considering chronic wounds- follow Hgb 5. Metabolic bone disease - P runs high - ? Diet +/- c/w binders- P here 3.4 with hospital diet and binders !!! Calcitriol just increased. Continue sensipar 60 and 5 renvela ac 6. Nutrition - changed to renal carb mod diet due to ^ K content of heart healthy diet/vitamin. Wants extra protein. I told her could have from home. 7. DM - per primary 8. 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 Disp - Discharge after HD on Monday 10. 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 Diarrhea vs loose stools - high risk for Cdiff - on clindamycin prior to admission. Says stools are firming up.     Subjective:    Stools are firming up Says still milking blood and pus from her had (has to be done TID) Wants more protein in her diet  Objective Filed Vitals:   06/29/15 1800 06/29/15 2002 06/30/15 0000 06/30/15 0434  BP: 74/28 92/25 103/26 99/25  Pulse: 62 66 64 67  Temp:  98.9 F (37.2 C) 99.2 F (37.3 C) 98.8 F (37.1 C)  TempSrc:  Oral Oral Oral  Resp: 20 22 15 15   Height:      Weight:    110 kg (242 lb 8.1 oz)  SpO2: 94% 90% 98% 95%   Physical Exam General: Chronically ill appearing, very pleasant, using the bedpan Heart: Regular Lungs: Clear Abdomen: obese soft NT Extremities: thin atrophic LE with mild edema, very woody feel to her LE';s s/p multiple toe amp/part foot amp Left hand wrapped, dressing not removed (gets changed TID with "milking" of the wound)      Dialysis Access:  Left thigh AVGG + bruit  Dialysis Orders: [place] 5 hr MWF 180 400/800 EDW 108 - leaving below 107.5 or lower 2 K 2 Ca no  profile Left thigh graft heparin 10000 with 7500 mid treatment (VERY HIGH) calcitriol 0.75 just increased. No ESA or Fe Recent labs: Hgb 12.2 11% sat ferritin 123 (surprisingly low) iPTH 796   Additional Objective Labs: Basic Metabolic Panel:  Recent Labs Lab 06/27/15 1414 06/28/15 0540 06/28/15 2106 06/30/15 0331  NA 137  --  139 137  K 4.6  --  3.4* 4.1  CL 89*  --  95* 95*  CO2 27  --  30 26  GLUCOSE 286*  --  129* 125*  BUN 36*  --  12 38*  CREATININE 5.71*  --  2.84* 6.26*  CALCIUM 9.1  --  8.8* 8.4*  PHOS  --  6.9* 3.4 6.7*     Recent Labs Lab 06/27/15 1414 06/28/15 2106 06/30/15 0331  AST 25  --   --   ALT 20  --   --   ALKPHOS 284*  --   --   BILITOT 0.9  --   --   PROT 6.9   --   --   ALBUMIN 3.1* 2.8* 2.5*  CBC:  Recent Labs Lab 06/27/15 1414 06/28/15 2106 06/30/15 0331  WBC 10.2 7.7 9.4  NEUTROABS 8.4*  --   --   HGB 12.8 11.5* 11.2*  HCT 43.6 39.6 39.4  MCV 94.2 93.2 94.5  PLT 204 163 179   Blood Culture    Component Value Date/Time   SDES BLOOD LEFT ANTECUBITAL 06/27/2015 1645   SPECREQUEST BOTTLES DRAWN AEROBIC AND ANAEROBIC 5CC 06/27/2015 1645   CULT NO GROWTH 2 DAYS 06/27/2015 1645   REPTSTATUS PENDING 06/27/2015 1645    Cardiac Enzymes:  Recent Labs Lab 06/27/15 1645  TROPONINI <0.03   CBG:  Recent Labs Lab 06/29/15 0749 06/29/15 1223 06/29/15 1707 06/29/15 1940 06/29/15 2133  GLUCAP 152* 127* 81 160* 125*   Medications:   . acidophilus  1 capsule Oral QHS  . aspirin EC  81 mg Oral QHS  . aztreonam  1 g Intravenous Q24H  . calcitRIOL  0.75 mcg Oral Q M,W,F-HD  . cinacalcet  60 mg Oral Q breakfast  . gabapentin  100 mg Oral QHS  . gabapentin  200 mg Oral Daily  . heparin  5,000 Units Subcutaneous 3 times per day  . insulin aspart  20 Units Subcutaneous TID WC  . insulin glargine  50 Units Subcutaneous QHS  . metronidazole  500 mg Intravenous Q8H  . midodrine  20 mg Oral TID WC  . multivitamin  1 tablet Oral QHS  . pantoprazole  40 mg Oral Daily  . pneumococcal 23 valent vaccine  0.5 mL Intramuscular Tomorrow-1000  . sevelamer carbonate  4,000 mg Oral TID WC  . silver sulfADIAZINE   Topical Daily  . sodium chloride  500 mL Intravenous Once  . vancomycin  1,000 mg Intravenous Q M,W,F-HD   Jamal Maes, MD [facility] [phone] Pager 06/30/2015, 9:22 AM

## 2015-07-01 LAB — CBC
HEMATOCRIT: 35.9 % — AB (ref 36.0–46.0)
HEMOGLOBIN: 10.3 g/dL — AB (ref 12.0–15.0)
MCH: 27 pg (ref 26.0–34.0)
MCHC: 28.7 g/dL — ABNORMAL LOW (ref 30.0–36.0)
MCV: 94.2 fL (ref 78.0–100.0)
Platelets: 177 10*3/uL (ref 150–400)
RBC: 3.81 MIL/uL — AB (ref 3.87–5.11)
RDW: 18.1 % — ABNORMAL HIGH (ref 11.5–15.5)
WBC: 12.9 10*3/uL — AB (ref 4.0–10.5)

## 2015-07-01 LAB — RENAL FUNCTION PANEL
Albumin: 2.4 g/dL — ABNORMAL LOW (ref 3.5–5.0)
Anion gap: 16 — ABNORMAL HIGH (ref 5–15)
BUN: 60 mg/dL — ABNORMAL HIGH (ref 6–20)
CHLORIDE: 96 mmol/L — AB (ref 101–111)
CO2: 22 mmol/L (ref 22–32)
CREATININE: 8.14 mg/dL — AB (ref 0.44–1.00)
Calcium: 8.5 mg/dL — ABNORMAL LOW (ref 8.9–10.3)
GFR calc non Af Amer: 5 mL/min — ABNORMAL LOW (ref >60)
GFR, EST AFRICAN AMERICAN: 6 mL/min — AB (ref >60)
Glucose, Bld: 349 mg/dL — ABNORMAL HIGH (ref 65–99)
POTASSIUM: 5.5 mmol/L — AB (ref 3.5–5.1)
Phosphorus: 7.2 mg/dL — ABNORMAL HIGH (ref 2.5–4.6)
Sodium: 134 mmol/L — ABNORMAL LOW (ref 135–145)

## 2015-07-01 LAB — GLUCOSE, CAPILLARY
GLUCOSE-CAPILLARY: 322 mg/dL — AB (ref 65–99)
GLUCOSE-CAPILLARY: 235 mg/dL — AB (ref 65–99)
Glucose-Capillary: 158 mg/dL — ABNORMAL HIGH (ref 65–99)
Glucose-Capillary: 149 mg/dL — ABNORMAL HIGH (ref 65–99)
Glucose-Capillary: 166 mg/dL — ABNORMAL HIGH (ref 65–99)

## 2015-07-01 MED ORDER — MIDODRINE HCL 5 MG PO TABS
ORAL_TABLET | ORAL | Status: AC
  Filled 2015-07-01: qty 4

## 2015-07-01 NOTE — Telephone Encounter (Signed)
OK very good I will have Rob (cone or myself (WL)  check in on her

## 2015-07-01 NOTE — Progress Notes (Signed)
ANTIBIOTIC CONSULT NOTE - FOLLOW UP  Pharmacy Consult for vancomycin, aztreonam, metronidazole Indication: cellulitis   Allergies  Allergen Reactions  . Zosyn [Piperacillin Sod-Tazobactam So] Shortness Of Breath    Passed out  . Amitriptyline Other (See Comments)    sleepy  . Benadryl [Diphenhydramine Hcl (Sleep)] Other (See Comments)    LBP  . Benadryl [Diphenhydramine Hcl]     Drop in BP  . Morphine     Pt is unsure   . Percocet [Oxycodone-Acetaminophen] Nausea And Vomiting  . Tylenol [Acetaminophen] Other (See Comments)    LBP  . Wellbutrin [Bupropion] Other (See Comments)    Bites nails .  Hallucinations     Patient Measurements: Height: 5\' 7"  (170.2 cm) Weight: 248 lb 0.3 oz (112.5 kg) IBW/kg (Calculated) : 61.6 Adjusted Body Weight:   Vital Signs: Temp: 101.4 F (38.6 C) (10/31 0750) Temp Source: Oral (10/31 0750) BP: 54/22 mmHg (10/31 1000) Pulse Rate: 73 (10/31 0930) Intake/Output from previous day: 10/30 0701 - 10/31 0700 In: 700 [P.O.:600; IV Piggyback:100] Out: 1 [Stool:1] Intake/Output from this shift:    Labs:  Recent Labs  06/28/15 2106 06/30/15 0331 07/01/15 0814  WBC 7.7 9.4 12.9*  HGB 11.5* 11.2* 10.3*  PLT 163 179 177  CREATININE 2.84* 6.26* 8.14*   Estimated Creatinine Clearance: 10.9 mL/min (by C-G formula based on Cr of 8.14). No results for input(s): VANCOTROUGH, VANCOPEAK, VANCORANDOM, GENTTROUGH, GENTPEAK, GENTRANDOM, TOBRATROUGH, TOBRAPEAK, TOBRARND, AMIKACINPEAK, AMIKACINTROU, AMIKACIN in the last 72 hours.   Microbiology: Recent Results (from the past 720 hour(s))  Blood Culture (routine x 2)     Status: None (Preliminary result)   Collection Time: 06/27/15  3:05 PM  Result Value Ref Range Status   Specimen Description BLOOD RIGHT FOREARM  Final   Special Requests BOTTLES DRAWN AEROBIC AND ANAEROBIC 5CC  Final   Culture NO GROWTH 4 DAYS  Final   Report Status PENDING  Incomplete  Blood Culture (routine x 2)     Status:  None (Preliminary result)   Collection Time: 06/27/15  4:45 PM  Result Value Ref Range Status   Specimen Description BLOOD LEFT ANTECUBITAL  Final   Special Requests BOTTLES DRAWN AEROBIC AND ANAEROBIC 5CC  Final   Culture NO GROWTH 4 DAYS  Final   Report Status PENDING  Incomplete  Surgical pcr screen     Status: None   Collection Time: 06/27/15  7:27 PM  Result Value Ref Range Status   MRSA, PCR NEGATIVE NEGATIVE Final   Staphylococcus aureus NEGATIVE NEGATIVE Final    Comment:        The Xpert SA Assay (FDA approved for NASAL specimens in patients over 75 years of age), is one component of a comprehensive surveillance program.  Test performance has been validated by [facility] for patients greater than or equal to 76 year old. It is not intended to diagnose infection nor to guide or monitor treatment.     Anti-infectives    Start     Dose/Rate Route Frequency Ordered Stop   06/28/15 1200  vancomycin (VANCOCIN) IVPB 1000 mg/200 mL premix     1,000 mg 200 mL/hr over 60 Minutes Intravenous Every M-W-F (Hemodialysis) 06/27/15 1516     06/27/15 2300  piperacillin-tazobactam (ZOSYN) IVPB 2.25 g  Status:  Discontinued     2.25 g 100 mL/hr over 30 Minutes Intravenous Every 8 hours 06/27/15 1516 06/27/15 1640   06/27/15 1700  aztreonam (AZACTAM) 1 g in dextrose 5 % 50 mL IVPB  1 g 100 mL/hr over 30 Minutes Intravenous Every 24 hours 06/27/15 1641     06/27/15 1700  metroNIDAZOLE (FLAGYL) IVPB 500 mg     500 mg 100 mL/hr over 60 Minutes Intravenous Every 8 hours 06/27/15 1641     06/27/15 1630  aztreonam (AZACTAM) 2 g in dextrose 5 % 50 mL IVPB  Status:  Discontinued     2 g 100 mL/hr over 30 Minutes Intravenous  Once 06/27/15 1615 06/27/15 1640   06/27/15 1630  metroNIDAZOLE (FLAGYL) IVPB 500 mg     500 mg 100 mL/hr over 60 Minutes Intravenous  Once 06/27/15 1615 06/27/15 1837   06/27/15 1500  vancomycin (VANCOCIN) 1,750 mg in sodium chloride 0.9 % 500 mL IVPB     1,750  mg 250 mL/hr over 120 Minutes Intravenous  Once 06/27/15 1457 06/27/15 1827   06/27/15 1445  piperacillin-tazobactam (ZOSYN) IVPB 3.375 g     3.375 g 100 mL/hr over 30 Minutes Intravenous  Once 06/27/15 1442 06/27/15 1548   06/27/15 1445  vancomycin (VANCOCIN) IVPB 1000 mg/200 mL premix  Status:  Discontinued     1,000 mg 200 mL/hr over 60 Minutes Intravenous  Once 06/27/15 1442 06/27/15 1457      Assessment: 49 y/o female with ESRD on HD MWF. She is on day 5 vancomycin, aztreonam, and metronidazole for cellulitis/L middle finger infection s/p amputation of L middle finger tip on 10/27. She also has a diabetic foot ulcer and a R gluteal decubitus ulcer. She is febrile with Tmax 101.4 and WBC increased to 12.9. Blood cultures are negative thus far.  Vanco 10/27>>  Aztreonam 10/27>>  Flagyl 10/27>>  MRSA PCR neg  10/27 BC x 2 - ngtd  Goal of Therapy:  pre HD vancomycin level 15-25 mcg/ml  Eradication of infection  Plan:  - Continue vancomycin 1000 mg IV after HD MWF - Continue aztreonam 1 g IV q24h - Continue metronidazole 500 mg IV q8h - Consider d/c aztreonam and changing to ceftriaxone per ID pharmacist recommendation  [facility] [place].D., BCPS Clinical Pharmacist Pager: [phone] 07/01/2015 10:51 AM

## 2015-07-01 NOTE — Progress Notes (Signed)
[facility] PROGRESS NOTE   [name] ZYS:[id number] DOB: November 18, 1965 DOA: 06/27/2015 PCP: Arnette Norris, MD  HPI/Subjective: Fever of 101.4 earlier today, continue current antibiotics. Spoke with Dr. Lenon Curt no need for revision of the I&D for now continue antibiotics.  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 Patient with a lactic acid initially of 5.02 with a fever of 101F. Plastic surgery hand was consult in the ED and will likely take patient for amputation once medically stable to do so. -Blood cultures 2 -Patient is on vancomycin, aztreonam and Flagyl continue current medications. -Incision and drainage done by Dr. Lenon Curt for the left third digit. -Fever of 101.4, she has palmar incision started to drain some pus.  -Spoke with Dr. Lenon Curt and he recommended to continue with milking of antibiotics. He does not think she needs I&D again.  Diabetic foot ulcer  -Patient was previously seen by infectious disease on 06/13/2015 -MRI without contrast of the right foot showed no evidence of osteomyelitis. -Continue IV antibiotics for now.  End stage renal failure on dialysis [facility]): Patient dialyzed M/W/F -Nephrology seeing the patient.  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 Zosyn was placed on patient's allergy list as it appears that she had an respiratory distress after receiving this medicine.   History of amputations    MORBID OBESITY: Stable  Dependent for wheelchair mobility  Code Status: Full Code Family Communication: Plan discussed with the patient. Disposition Plan: Remains inpatient Diet: Diet renal/carb modified with fluid restriction Diet-HS Snack?: Nothing; Room service appropriate?: Yes; Fluid consistency:: Thin  Consultants:  nephrology  Procedures:  None  Antibiotics:  Vancomycin, aztreonam and Flagyl.   Objective: Filed Vitals:   07/01/15 1322  BP: 80/29  Pulse: 64  Temp: 98.1 F (36.7 C)  Resp: 15    Intake/Output Summary (Last 24 hours) at 07/01/15 1354 Last data filed at 07/01/15 1322  Gross per 24 hour  Intake    220 ml  Output   2851 ml  Net  -2631 ml   Filed Weights   07/01/15 0750 07/01/15 0800 07/01/15 1322  Weight: 112.5 kg (248 lb 0.3 oz) 112.5 kg (248 lb 0.3 oz) 108.8 kg (239 lb 13.8 oz)    Exam: General: Alert and awake, oriented x3, not in any acute distress. HEENT: anicteric sclera, pupils reactive to light and accommodation, EOMI CVS: S1-S2 clear, no murmur rubs or gallops Chest: clear to auscultation bilaterally, no wheezing, rales or rhonchi Abdomen: soft nontender, nondistended, normal bowel sounds, no organomegaly Extremities: no cyanosis, clubbing or edema noted bilaterally Neuro: Cranial nerves II-XII intact, no focal neurological  deficits  Data Reviewed: Basic Metabolic Panel:  Recent Labs Lab 06/27/15 1414 06/28/15 0540 06/28/15 2106 06/30/15 0331 07/01/15 0814  NA 137  --  139 137 134*  K 4.6  --  3.4* 4.1 5.5*  CL 89*  --  95* 95* 96*  CO2 27  --  30  26 22   GLUCOSE 286*  --  129* 125* 349*  BUN 36*  --  12 38* 60*  CREATININE 5.71*  --  2.84* 6.26* 8.14*  CALCIUM 9.1  --  8.8* 8.4* 8.5*  PHOS  --  6.9* 3.4 6.7* 7.2*   Liver Function Tests:  Recent Labs Lab 06/27/15 1414 06/28/15 2106 06/30/15 0331 07/01/15 0814  AST 25  --   --   --   ALT 20  --   --   --   ALKPHOS 284*  --   --   --   BILITOT 0.9  --   --   --   PROT 6.9  --   --   --   ALBUMIN 3.1* 2.8* 2.5* 2.4*   No results for input(s): LIPASE, AMYLASE in the last 168 hours. No results for input(s): AMMONIA in the last 168 hours. CBC:  Recent Labs Lab 06/27/15 1414 06/28/15 2106 06/30/15 0331 07/01/15 0814  WBC 10.2 7.7 9.4 12.9*  NEUTROABS 8.4*  --   --   --   HGB 12.8 11.5* 11.2* 10.3*  HCT 43.6 39.6 39.4 35.9*  MCV 94.2 93.2 94.5 94.2  PLT 204 163 179 177   Cardiac Enzymes:  Recent Labs Lab 06/27/15 1645  TROPONINI <0.03   BNP (last 3 results) No results for input(s): BNP in the last 8760 hours.  ProBNP (last 3 results) No results for input(s): PROBNP in the last 8760 hours.  CBG:  Recent Labs Lab 06/30/15 1315 06/30/15 1648 06/30/15 2319 07/01/15 0739 07/01/15 1259  GLUCAP 123* 88 175* 322* 158*    Micro Recent Results (from the past 240 hour(s))  Blood Culture (routine x 2)     Status: None (Preliminary result)   Collection Time: 06/27/15  3:05 PM  Result Value Ref Range Status   Specimen Description BLOOD RIGHT FOREARM  Final   Special Requests BOTTLES DRAWN AEROBIC AND ANAEROBIC 5CC  Final   Culture NO GROWTH 4 DAYS  Final   Report Status PENDING  Incomplete  Blood Culture (routine x 2)     Status: None (Preliminary result)   Collection Time: 06/27/15  4:45 PM  Result Value Ref Range Status   Specimen Description BLOOD LEFT ANTECUBITAL  Final   Special Requests BOTTLES DRAWN AEROBIC AND ANAEROBIC 5CC  Final   Culture NO GROWTH 4 DAYS  Final   Report Status PENDING  Incomplete  Surgical pcr screen     Status: None    Collection Time: 06/27/15  7:27 PM  Result Value Ref Range Status   MRSA, PCR NEGATIVE NEGATIVE Final   Staphylococcus aureus NEGATIVE NEGATIVE Final    Comment:        The Xpert SA Assay (FDA approved for NASAL specimens in patients over 9 years of age), is one component of a comprehensive surveillance program.  Test performance has been validated by [facility] for patients greater than or equal to 47 year old. It is not intended to diagnose infection nor to guide or monitor treatment.      Studies: No results found.  Scheduled Meds: . acidophilus  1 capsule Oral QHS  . aspirin  EC  81 mg Oral QHS  . aztreonam  1 g Intravenous Q24H  . calcitRIOL  0.75 mcg Oral Q M,W,F-HD  . cinacalcet  60 mg Oral Q breakfast  . gabapentin  100 mg Oral QHS  . gabapentin  200 mg Oral Daily  . heparin  5,000 Units Subcutaneous 3 times per day  . insulin aspart  20 Units Subcutaneous TID WC  . insulin glargine  50 Units Subcutaneous QHS  . metronidazole  500 mg Intravenous Q8H  . midodrine  20 mg Oral TID WC  . multivitamin  1 tablet Oral QHS  . pantoprazole  40 mg Oral Daily  . pneumococcal 23 valent vaccine  0.5 mL Intramuscular Tomorrow-1000  . sevelamer carbonate  4,000 mg Oral TID WC  . silver sulfADIAZINE   Topical Daily  . sodium chloride  500 mL Intravenous Once  . vancomycin  1,000 mg Intravenous Q M,W,F-HD   Continuous Infusions:       Time spent: 35 minutes    [facility]  [facility] Pager [phone] If 7PM-7AM, please contact night-coverage at [email or web address], password [facility] 07/01/2015, 1:54 PM  LOS: 4 days

## 2015-07-01 NOTE — Telephone Encounter (Signed)
Pt is currently admitted following a partial amputation of her finger.

## 2015-07-01 NOTE — Progress Notes (Signed)
[facility] Progress Note  Assessment/Plan: 1. 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  Will need to decide on d/c meds:  Vancomycin/fortaz/ancef/aminoglycosides all available at outpt HD unit. She is to F/U with wound care, do TID "milking of hand" + dressing changes; hygiene is marginal. D/c needs to be postponed.. WBC ^ today 12.3 She is also at high risk for infection due to CHEWING fingers - nails on right hand bloodied 2. ESRD - MWF -- net UF Friday 3.1 with post weight 106.8- goal 5 L today -  3. Hypotension/volume - has chronic low BP and generally high IDWG, but not so much lately - even leaving below EDW- midodrine for BP support pre and mid HD treatment- keep systolic BP >83 4. Anemia - Hgb 12 >11.2 > 10.3 - no ESA or Fe for months despite low tsat and even ferritin of 123, which is low considering chronic wounds- follow Hgb- - repeat Hgb tomorrow - if still low post HD - start ESA on Wednesday 5. Metabolic bone disease - P runs high - ? Diet +/- c/w binders- P here 3.4 with hospital diet and binders !!! Calcitriol just increased. Continue sensipar 60 and 5 renvela ac 6. Nutrition - changed to renal carb mod diet due to ^ K content of heart healthy diet/vitamin. Wants extra protein. I told her could have from home. 7. DM - per primary 8. 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 clinic- care as outlined by RN 9. Disp - Discharge after HD on Monday 10. 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 Diarrhea vs loose stools - high risk for Cdiff - on clindamycin prior to admission. Says stools are firming up- just goes  everytimes she eats.  Myriam Jacobson, PA-C [place] 07/01/2015,8:40 AM  LOS: 4 days   Pt seen, examined and agree w A/P as above.  Shyler Splinter MD [facility] pager [phone]    cell [phone] 07/01/2015, 11:55 AM    Subjective:  Slept well after the game yesterday - drowsy this am  Objective Filed Vitals:   06/30/15 2100 07/01/15 0500 07/01/15 0750 07/01/15 0800  BP: 84/42 104/44 83/46 87/42   Pulse: 66 84 83 81  Temp: 98.1 F (36.7 C) 98.4 F (36.9 C) 101.4 F (38.6 C)   TempSrc: Oral Oral Oral   Resp: 20 20 20 22   Height:      Weight:   112.5 kg (248 lb 0.3 oz) 112.5 kg (248 lb 0.3 oz)  SpO2: 100% 99% 98% 98%   Physical Exam General: NAD supine on HD Heart: RRR surprised not more tachy with fever Lungs:- no rales or rhonchi Abdomen: obese soft NT Extremities: LE atrophic, multiple toe and partial foot amp, tr LE edema Dialysis Access:  Left thigh AVGG no evidence of infx  Dialysis Orders: [facility] 5 hr MWF 180 400/800 EDW 108 - leaving below 107.5 or lower 2 K 2 Ca no profile Left thigh graft heparin 10000 with 7500 mid treatment (VERY HIGH) calcitriol 0.75 just increased. No  ESA or Fe Recent labs: Hgb 12.2 11% sat ferritin 123 (surprisingly low) iPTH 796   Additional Objective Labs: Basic Metabolic Panel:  Recent Labs Lab 06/28/15 2106 06/30/15 0331 07/01/15 0814  NA 139 137 134*  K 3.4* 4.1 5.5*  CL 95* 95* 96*  CO2 30 26 22   GLUCOSE 129* 125* 349*  BUN 12 38* 60*  CREATININE 2.84* 6.26* 8.14*  CALCIUM 8.8* 8.4* 8.5*  PHOS 3.4 6.7* 7.2*   Liver Function Tests:  Recent Labs Lab 06/27/15 1414 06/28/15 2106 06/30/15 0331 07/01/15 0814  AST 25  --   --   --   ALT 20  --   --   --   ALKPHOS 284*  --   --   --   BILITOT 0.9  --   --   --   PROT 6.9  --   --   --   ALBUMIN 3.1* 2.8* 2.5* 2.4*   CBC:  Recent Labs Lab 06/27/15 1414 06/28/15 2106 06/30/15 0331 07/01/15 0814  WBC 10.2 7.7  9.4 12.9*  NEUTROABS 8.4*  --   --   --   HGB 12.8 11.5* 11.2* 10.3*  HCT 43.6 39.6 39.4 35.9*  MCV 94.2 93.2 94.5 94.2  PLT 204 163 179 177   Blood Culture    Component Value Date/Time   SDES BLOOD LEFT ANTECUBITAL 06/27/2015 1645   SPECREQUEST BOTTLES DRAWN AEROBIC AND ANAEROBIC 5CC 06/27/2015 1645   CULT NO GROWTH 4 DAYS 06/27/2015 1645   REPTSTATUS PENDING 06/27/2015 1645    Cardiac Enzymes:  Recent Labs Lab 06/27/15 1645  TROPONINI <0.03   CBG:  Recent Labs Lab 06/30/15 0809 06/30/15 1315 06/30/15 1648 06/30/15 2319 07/01/15 0739  GLUCAP 140* 123* 88 175* 322*   Iron Studies: No results for input(s): IRON, TIBC, TRANSFERRIN, FERRITIN in the last 72 hours. @lablastinr3 @ Studies/Results: No results found. Medications:   . acidophilus  1 capsule Oral QHS  . aspirin EC  81 mg Oral QHS  . aztreonam  1 g Intravenous Q24H  . calcitRIOL  0.75 mcg Oral Q M,W,F-HD  . cinacalcet  60 mg Oral Q breakfast  . gabapentin  100 mg Oral QHS  . gabapentin  200 mg Oral Daily  . heparin  5,000 Units Subcutaneous 3 times per day  . insulin aspart  20 Units Subcutaneous TID WC  . insulin glargine  50 Units Subcutaneous QHS  . metronidazole  500 mg Intravenous Q8H  . midodrine  20 mg Oral TID WC  . multivitamin  1 tablet Oral QHS  . pantoprazole  40 mg Oral Daily  . pneumococcal 23 valent vaccine  0.5 mL Intramuscular Tomorrow-1000  . sevelamer carbonate  4,000 mg Oral TID WC  . silver sulfADIAZINE   Topical Daily  . sodium chloride  500 mL Intravenous Once  . vancomycin  1,000 mg Intravenous Q M,W,F-HD

## 2015-07-02 DIAGNOSIS — M86141 Other acute osteomyelitis, right hand: Secondary | ICD-10-CM

## 2015-07-02 DIAGNOSIS — B9689 Other specified bacterial agents as the cause of diseases classified elsewhere: Secondary | ICD-10-CM

## 2015-07-02 DIAGNOSIS — Z89021 Acquired absence of right finger(s): Secondary | ICD-10-CM

## 2015-07-02 DIAGNOSIS — Z915 Personal history of self-harm: Secondary | ICD-10-CM

## 2015-07-02 DIAGNOSIS — Z992 Dependence on renal dialysis: Secondary | ICD-10-CM

## 2015-07-02 DIAGNOSIS — N186 End stage renal disease: Secondary | ICD-10-CM

## 2015-07-02 LAB — GLUCOSE, CAPILLARY
GLUCOSE-CAPILLARY: 194 mg/dL — AB (ref 65–99)
Glucose-Capillary: 254 mg/dL — ABNORMAL HIGH (ref 65–99)
Glucose-Capillary: 140 mg/dL — ABNORMAL HIGH (ref 65–99)
Glucose-Capillary: 130 mg/dL — ABNORMAL HIGH (ref 65–99)

## 2015-07-02 LAB — CBC
HCT: 41.2 % (ref 36.0–46.0)
Hemoglobin: 11.8 g/dL — ABNORMAL LOW (ref 12.0–15.0)
MCH: 27.4 pg (ref 26.0–34.0)
MCHC: 28.6 g/dL — ABNORMAL LOW (ref 30.0–36.0)
MCV: 95.6 fL (ref 78.0–100.0)
Platelets: 206 10*3/uL (ref 150–400)
RBC: 4.31 MIL/uL (ref 3.87–5.11)
RDW: 18.3 % — ABNORMAL HIGH (ref 11.5–15.5)
WBC: 11.2 10*3/uL — ABNORMAL HIGH (ref 4.0–10.5)

## 2015-07-02 LAB — CULTURE, BLOOD (ROUTINE X 2)
CULTURE: NO GROWTH
Culture: NO GROWTH

## 2015-07-02 LAB — C DIFFICILE QUICK SCREEN W PCR REFLEX
C DIFFICILE (CDIFF) TOXIN: NEGATIVE
C DIFFICLE (CDIFF) ANTIGEN: NEGATIVE
C Diff interpretation: NEGATIVE

## 2015-07-02 MED ORDER — CIPROFLOXACIN HCL 500 MG PO TABS
500.0000 mg | ORAL_TABLET | ORAL | Status: DC
  Administered 2015-07-02: 500 mg via ORAL
  Filled 2015-07-02: qty 1

## 2015-07-02 NOTE — Progress Notes (Signed)
[facility] Progress Note  Assessment/Plan: 1. 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  She is to F/U with wound care, do TID "milking of hand" + dressing changes; hygiene is marginal. D/c needs to be postponed.. WBC ^ today 12.9>11.2 She is also at high risk for infection due to CHEWING fingers - nails on right hand bloodied 2. ESRD - MWF -- net UF Friday 3.1 with post weight 106.8; Monday only able to UF 2.85 with post weight of 108.8. - Plan HD Wednesday - try to get more volume off as BP allows.  3. Hypotension/volume - UF Monday likely limited by fever midodrine for BP support pre and mid HD treatment- keep systolic BP >35 4. Anemia - Hgb 12 >11.2 > 10.3  Then up to 11.8 after HD yesterday no ESA or Fe for months despite low tsat and even ferritin of 123, which is low considering chronic wounds- follow Hgb- continue to follow 5. Metabolic bone disease - P runs high - ? Diet +/- c/w binders- P variable in  hospital diet - is eating some food from the outside.  Calcitriol just increased. Continue sensipar 60 and 5 renvela ac 6. Nutrition - changed to renal carb mod diet due to ^ K content of heart healthy diet/vitamin.  7. DM - per primary 8. 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 Diarrhea vs loose stools - high risk for Cdiff - on clindamycin prior to admission. Says stools are firming - just goes everytimes she eats.  Myriam Jacobson, PA-C [place] 07/02/2015,9:08 AM  LOS: 5 days    Pt seen, examined and agree w  A/P as above.  Briele Splinter MD [facility] pager [phone]    cell [phone] 07/02/2015, 12:54 PM   Subjective:   Not eating much due to nausea.  Every time she coughs she passes small amounts of stool. She wants hand MD to look at hand again.  She is worried that the dialysis center will not have the correct antibiotics after d/c  Objective Filed Vitals:   07/01/15 1551 07/01/15 2226 07/02/15 0508 07/02/15 0839  BP: 85/34 94/40 98/57  90/63  Pulse: 62 70 68 68  Temp: 98.5 F (36.9 C) 99.2 F (37.3 C) 99.4 F (37.4 C) 99.3 F (37.4 C)  TempSrc: Oral Oral Oral Oral  Resp: 16 16 17 18   Height:      Weight:  109.317 kg (241 lb)    SpO2: 92% 81% 90% 90%   Physical Exam General: talkatiave supine while getting hair washed no SOB Heart: RRR Lungs: no rales Abdomen: obese soft  Buttocks:  Reddened stool semisolid, circular healing ulcer on right buttock - no breakdown; purplish Extremities: tr LE edema left 3rd finger wrapped Dialysis Access: left thigh AVGG  Dialysis Orders:  [facility] 5 hr MWF 180 400/800 EDW 108 - leaving below 107.5 or lower 2 K 2 Ca no profile Left thigh graft heparin 10000 with 7500 mid treatment (VERY HIGH)  calcitriol 0.75 just increased. No ESA or Fe Recent labs: Hgb 12.2 11% sat ferritin 123 (surprisingly low) iPTH 796   Additional Objective Labs: Basic Metabolic Panel:  Recent Labs Lab 06/28/15 2106 06/30/15 0331 07/01/15 0814  NA 139 137 134*  K 3.4* 4.1 5.5*  CL 95* 95* 96*  CO2 30 26 22   GLUCOSE 129* 125* 349*  BUN 12 38* 60*  CREATININE 2.84* 6.26* 8.14*  CALCIUM 8.8* 8.4* 8.5*  PHOS 3.4 6.7* 7.2*   Liver Function Tests:  Recent Labs Lab 06/27/15 1414 06/28/15 2106 06/30/15 0331 07/01/15 0814  AST 25  --   --   --   ALT 20  --   --   --   ALKPHOS 284*  --   --   --   BILITOT 0.9  --   --   --   PROT 6.9  --   --   --   ALBUMIN 3.1* 2.8* 2.5* 2.4*   CBC:  Recent Labs Lab 06/27/15 1414 06/28/15 2106  06/30/15 0331 07/01/15 0814 07/02/15 0551  WBC 10.2 7.7 9.4 12.9* 11.2*  NEUTROABS 8.4*  --   --   --   --   HGB 12.8 11.5* 11.2* 10.3* 11.8*  HCT 43.6 39.6 39.4 35.9* 41.2  MCV 94.2 93.2 94.5 94.2 95.6  PLT 204 163 179 177 206   Blood Culture    Component Value Date/Time   SDES BLOOD LEFT ANTECUBITAL 06/27/2015 1645   SPECREQUEST BOTTLES DRAWN AEROBIC AND ANAEROBIC 5CC 06/27/2015 1645   CULT NO GROWTH 4 DAYS 06/27/2015 1645   REPTSTATUS PENDING 06/27/2015 1645    Cardiac Enzymes:  Recent Labs Lab 06/27/15 1645  TROPONINI <0.03   CBG:  Recent Labs Lab 07/01/15 1259 07/01/15 1357 07/01/15 1642 07/01/15 2214 07/02/15 0838  GLUCAP 158* 149* 235* 166* 254*   Medications:   . acidophilus  1 capsule Oral QHS  . aspirin EC  81 mg Oral QHS  . aztreonam  1 g Intravenous Q24H  . calcitRIOL  0.75 mcg Oral Q M,W,F-HD  . cinacalcet  60 mg Oral Q breakfast  . gabapentin  100 mg Oral QHS  . gabapentin  200 mg Oral Daily  . heparin  5,000 Units Subcutaneous 3 times per day  . insulin aspart  20 Units Subcutaneous TID WC  . insulin glargine  50 Units Subcutaneous QHS  . metronidazole  500 mg Intravenous Q8H  . midodrine  20 mg Oral TID WC  . multivitamin  1 tablet Oral QHS  . pantoprazole  40 mg Oral Daily  . pneumococcal 23 valent vaccine  0.5 mL Intramuscular Tomorrow-1000  . sevelamer carbonate  4,000 mg Oral TID WC  . silver sulfADIAZINE   Topical Daily  . sodium chloride  500 mL Intravenous Once  . vancomycin  1,000 mg Intravenous Q M,W,F-HD

## 2015-07-02 NOTE — Progress Notes (Signed)
[facility] PROGRESS NOTE   [name] HWE:[id number] DOB: 08-Dec-1965 DOA: 06/27/2015 PCP: Arnette Norris, MD.  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 Seen by hand surgery and did incision and drainage, she was started on antibiotics in the ED and had what appears to be reaction to Zosyn (patient was been in a burn unit before and she did not believe she received to Zosyn). Current antibiotics vancomycin, aztreonam and Flagyl. While on these antibiotics developed fever, discussed with Dr. Lenon Curt the hand surgeon and recommended to continue antibiotics and did not recommend her to repeat the I&D. Patient high risk for C. difficile, she was in clindamycin prior to admission, I'll check stools for C. difficile.   HPI/Subjective: Continues to have low-grade fever, today's 99.3, ID to evaluate. Continues to have loose stools, will check C. 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 Patient with a lactic acid initially of 5.02 with a fever of 101F. Plastic surgery hand was consult in the ED and will likely take patient for amputation once medically stable to do so. -Blood cultures 2 -Patient is on vancomycin, aztreonam and Flagyl continue current medications. -Incision and drainage done by Dr. Lenon Curt for the left third digit. -Fever of 101.4, she has palmar incision started to drain some pus.  -Spoke with Dr. Lenon Curt and he recommended to  continue with milking of antibiotics. He does not think she needs I&D again. -ID for further antibiotics and recommendation.  Diarrhea Reportedly was on oral clindamycin prior to admission, had loose stools on admission. Improved for 1 day and started back, check C. difficile.   Diabetic foot ulcer  -Patient was previously seen by infectious disease on 06/13/2015 -MRI without contrast of the right foot showed no evidence of osteomyelitis. -Continue IV antibiotics for now.  End stage renal failure on dialysis [facility]): Patient dialyzed M/W/F -Nephrology seeing the patient.  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 Zosyn was placed on patient's allergy list as it appears that she had an respiratory distress after receiving this medicine.   History of amputations    MORBID OBESITY: Stable  Dependent for wheelchair mobility  Code Status: Full Code Family Communication: Plan discussed with the patient. Disposition Plan: Remains inpatient Diet: Diet renal/carb modified with fluid restriction Diet-HS Snack?: Nothing; Room service appropriate?: Yes; Fluid consistency:: Thin  Consultants:  nephrology  Procedures:  None  Antibiotics:  Vancomycin, aztreonam and Flagyl.   Objective: Filed Vitals:   07/02/15  0839  BP: 90/63  Pulse: 68  Temp: 99.3 F (37.4 C)  Resp: 18    Intake/Output Summary (Last 24 hours) at 07/02/15 1359 Last data  filed at 07/02/15 1203  Gross per 24 hour  Intake    840 ml  Output      2 ml  Net    838 ml   Filed Weights   07/01/15 0800 07/01/15 1322 07/01/15 2226  Weight: 112.5 kg (248 lb 0.3 oz) 108.8 kg (239 lb 13.8 oz) 109.317 kg (241 lb)    Exam: General: Alert and awake, oriented x3, not in any acute distress. HEENT: anicteric sclera, pupils reactive to light and accommodation, EOMI CVS: S1-S2 clear, no murmur rubs or gallops Chest: clear to auscultation bilaterally, no wheezing, rales or rhonchi Abdomen: soft nontender, nondistended, normal bowel sounds, no organomegaly Extremities: no cyanosis, clubbing or edema noted bilaterally Neuro: Cranial nerves II-XII intact, no focal neurological deficits  Data Reviewed: Basic Metabolic Panel:  Recent Labs Lab 06/27/15 1414 06/28/15 0540 06/28/15 2106 06/30/15 0331 07/01/15 0814  NA 137  --  139 137 134*  K 4.6  --  3.4* 4.1 5.5*  CL 89*  --  95* 95* 96*  CO2 27  --  30 26 22   GLUCOSE 286*  --  129* 125* 349*  BUN 36*  --  12 38* 60*  CREATININE 5.71*  --  2.84* 6.26* 8.14*  CALCIUM 9.1  --  8.8* 8.4* 8.5*  PHOS  --  6.9* 3.4 6.7* 7.2*   Liver Function Tests:  Recent Labs Lab 06/27/15 1414 06/28/15 2106 06/30/15 0331 07/01/15 0814  AST 25  --   --   --   ALT 20  --   --   --   ALKPHOS 284*  --   --   --   BILITOT 0.9  --   --   --   PROT 6.9  --   --   --   ALBUMIN 3.1* 2.8* 2.5* 2.4*   No results for input(s): LIPASE, AMYLASE in the last 168 hours. No results for input(s): AMMONIA in the last 168 hours. CBC:  Recent Labs Lab 06/27/15 1414 06/28/15 2106 06/30/15 0331 07/01/15 0814 07/02/15 0551  WBC 10.2 7.7 9.4 12.9* 11.2*  NEUTROABS 8.4*  --   --   --   --   HGB 12.8 11.5* 11.2* 10.3* 11.8*  HCT 43.6 39.6 39.4 35.9* 41.2  MCV 94.2 93.2 94.5 94.2 95.6  PLT 204 163 179 177 206   Cardiac Enzymes:  Recent Labs Lab 06/27/15 1645  TROPONINI <0.03   BNP (last 3 results) No results for input(s): BNP in  the last 8760 hours.  ProBNP (last 3 results) No results for input(s): PROBNP in the last 8760 hours.  CBG:  Recent Labs Lab 07/01/15 1357 07/01/15 1642 07/01/15 2214 07/02/15 0838 07/02/15 1148  GLUCAP 149* 235* 166* 254* 194*    Micro Recent Results (from the past 240 hour(s))  Blood Culture (routine x 2)     Status: None (Preliminary result)   Collection Time: 06/27/15  3:05 PM  Result Value Ref Range Status   Specimen Description BLOOD RIGHT FOREARM  Final   Special Requests BOTTLES DRAWN AEROBIC AND ANAEROBIC 5CC  Final   Culture NO GROWTH 4 DAYS  Final   Report Status PENDING  Incomplete  Blood Culture (routine x 2)     Status: None (Preliminary result)   Collection Time: 06/27/15  4:45 PM  Result Value Ref Range Status   Specimen Description BLOOD LEFT ANTECUBITAL  Final   Special Requests BOTTLES DRAWN AEROBIC AND ANAEROBIC 5CC  Final   Culture NO GROWTH 4 DAYS  Final   Report Status PENDING  Incomplete  Surgical pcr screen     Status: None   Collection Time: 06/27/15  7:27 PM  Result Value Ref Range Status   MRSA, PCR NEGATIVE NEGATIVE Final   Staphylococcus aureus NEGATIVE NEGATIVE Final    Comment:        The Xpert SA Assay (FDA approved for NASAL specimens in patients over 51 years of age), is one component of a comprehensive surveillance program.  Test performance has been validated by [facility] for patients greater than or equal to 43 year old. It is not intended to diagnose infection nor to guide or monitor treatment.      Studies: No results found.  Scheduled Meds: . acidophilus  1 capsule Oral QHS  . aspirin EC  81 mg Oral QHS  . aztreonam  1 g Intravenous Q24H  . calcitRIOL  0.75 mcg Oral Q M,W,F-HD  . cinacalcet  60 mg Oral Q breakfast  . gabapentin  100 mg Oral QHS  . gabapentin  200 mg Oral Daily  . insulin aspart  20 Units Subcutaneous TID WC  . insulin glargine  50 Units Subcutaneous QHS  . metronidazole  500 mg Intravenous  Q8H  . midodrine  20 mg Oral TID WC  . multivitamin  1 tablet Oral QHS  . pantoprazole  40 mg Oral Daily  . pneumococcal 23 valent vaccine  0.5 mL Intramuscular Tomorrow-1000  . sevelamer carbonate  4,000 mg Oral TID WC  . silver sulfADIAZINE   Topical Daily  . sodium chloride  500 mL Intravenous Once  . vancomycin  1,000 mg Intravenous Q M,W,F-HD   Continuous Infusions:       Time spent: 35 minutes    [facility] A  [facility] Pager [phone] If 7PM-7AM, please contact night-coverage at [email or web address], password [facility] 07/02/2015, 1:59 PM  LOS: 5 days

## 2015-07-02 NOTE — Progress Notes (Signed)
Inpatient Diabetes Program Recommendations  AACE/ADA: New Consensus Statement on Inpatient Glycemic Control (2015)  Target Ranges:  Prepandial:   less than 140 mg/dL      Peak postprandial:   less than 180 mg/dL (1-2 hours)      Critically ill patients:  140 - 180 mg/dL  Results for [name], [name] (MRN [id number]) as of 07/02/2015 12:32  Ref. Range 07/01/2015 07:39 07/01/2015 12:59 07/01/2015 13:57 07/01/2015 16:42 07/01/2015 22:14 07/02/2015 08:38 07/02/2015 11:48  Glucose-Capillary Latest Ref Range: 65-99 mg/dL 322 (H) 158 (H) 149 (H) 235 (H) 166 (H) 254 (H) 194 (H)   Review of Glycemic Control  Diabetes history: DM2 Outpatient Diabetes medications: Lantus 50 units QHS, Novolog 20 units TID with meals Current orders for Inpatient glycemic control: Lantus 50 units QHS, Novolog 20 units TID with meals  Inpatient Diabetes Program Recommendations: Correction (SSI): Please order CBGs with Novolog correction scale ACHS. Insulin - Meal Coverage: Noted patient did not eat breakfast this morning and therefore she refused Novolog meal coverage. Please add hold parameters to meal coverage (Assumes patient eating 50 % or more of meal, Do not give if premeal glucose is less than 80 mg/dl; Do not give if patient eats less than 50% of meals)  Thanks, Barnie Alderman, RN, MSN, CDE Diabetes Coordinator Inpatient Diabetes Program [phone] (Team Pager from [place] to [place]) [phone] ([place] office) [phone] [facility] office) [phone] [facility] office)

## 2015-07-02 NOTE — Consult Note (Signed)
[facility] for Infectious Disease       Reason for Consult: osteomyelitis of finger    Referring Physician: Dr. 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 acidophilus  1 capsule Oral QHS  . aspirin EC  81 mg Oral QHS  . aztreonam  1 g Intravenous Q24H  . calcitRIOL  0.75 mcg Oral Q M,W,F-HD  . cinacalcet  60 mg Oral Q breakfast  . gabapentin  100 mg Oral QHS  . gabapentin  200 mg Oral Daily  . insulin aspart  20 Units Subcutaneous TID WC  . insulin glargine  50 Units Subcutaneous QHS  . metronidazole  500 mg Intravenous Q8H  . midodrine  20 mg Oral TID WC  . multivitamin  1 tablet Oral QHS  . pantoprazole  40 mg Oral Daily  . pneumococcal 23 valent vaccine  0.5 mL Intramuscular Tomorrow-1000  . sevelamer carbonate  4,000 mg Oral TID WC  . silver sulfADIAZINE   Topical Daily  . sodium chloride  500 mL Intravenous Once  . vancomycin  1,000 mg Intravenous Q M,W,F-HD    Recommendations: Vancomycin with dialysis for one more week cipro renally dosed for one more week   Assessment: She has osteomyelitis of finger now s/p amputation.   OP report notes good gross margins.   Self mutilation ESRD  Antibiotics: Vancomycin, aztreonam, flagyl  HPI: [name] is a 49 y.o. female with multiple hand and foot wounds, ESRD, PVD who was seen by ID for concern of foot osteomyelitis but then came in to ED with right finger wound.  Found in OR to be wet gangrene and underwent amputation.  No culture sent of wound/bone.  Has been on vancomycin, aztreonam, flagyl.  Was given a dose of zosyn and had an acute reaction.  Finger swelling occurred over just a few  days.  Also had MRI of foot and no osteomyelitis.  MRI independently reviewed and I dont appreciate any areas of abscess.    Review of Systems:  Constitutional: negative for fevers and chills Cardiovascular: negative for chest pain Gastrointestinal: negative for diarrhea All other systems reviewed and are negative   Past Medical History  Diagnosis Date  . 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 Lung nodule     left lung  . Sinus problem   . Neuropathy ([place])   . Hyperkalemia   . Allergy     seasonal  . Cataract     surgery  . GERD (gastroesophageal reflux disease)   . Low blood pressure   . Neuromuscular disorder ([place])   . Gait difficulty     "uses motor or standard wheelchair" -unsteady on feet.  . 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 upper arm, 02-08-14 now has AV Goretex graft Lt. thigh  . ESRD (end stage renal disease) ([place])     end stage, on dialysis since 05/2002. dry wt. 113 kg.M-W-F, [address]  Marland Kitchen Complication of anesthesia     BP runs usually low. Had an issue with "twilight sleep" in past.  . 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 Personal history of colonic polyps - adenomas 02/15/2014    02/15/2014 2 small polyps , max 7 mm transverse colon    . Diabetes mellitus     type 2  . Hypotension   . DVT (deep venous thrombosis) ([place]) 05/2009  . Unable to bear weight   . Third degree burn     Hand  March- follwed [facility]  . Diabetic foot ulcer ([place]) 06/13/2015  . Fungal infection 06/13/2015  . 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 Bite wound of finger 06/13/2015    Social History  Substance Use Topics  . Smoking status: Never Smoker   . Smokeless tobacco: Never Used  . Alcohol Use: Yes     Comment: occasionally    Family History  Problem Relation Age of Onset  . Hypertension Mother   . Liver disease Mother   . Hypertension Father   . Colon polyps Father   . Heart disease Father   . Diabetes Maternal  Grandmother   . Diabetes Maternal Grandfather   . Diabetes Paternal Grandmother   . Diabetes Paternal Grandfather   . Colon cancer Neg Hx     Allergies  Allergen Reactions  . Zosyn [Piperacillin Sod-Tazobactam So] Shortness Of Breath    Passed out  . Amitriptyline Other (See Comments)    sleepy  . Benadryl [Diphenhydramine Hcl (Sleep)] Other (See Comments)    LBP  . Benadryl [Diphenhydramine Hcl]     Drop in BP  . Morphine     Pt is unsure   . Percocet [Oxycodone-Acetaminophen] Nausea And Vomiting  . Tylenol [Acetaminophen] Other (See Comments)    LBP  . Wellbutrin [Bupropion] Other (See Comments)    Bites nails .  Hallucinations     Physical Exam: Constitutional: in no apparent distress and alert  Filed Vitals:   07/02/15 1430  BP: 89/47  Pulse: 115  Temp: 99.6 F (37.6 C)  Resp: 18   EYES: anicteric ENMT: no thrush Cardiovascular: Cor RRR Respiratory: clear; normal effort GI: Bowel sounds are normal Musculoskeletal: peripheral pulses normal, no pedal edema, no clubbing or cyanosis, fingers with dried blood at tips Skin: negatives: no rash Hematologic: no cervical lad  Lab Results  Component Value Date   WBC 11.2* 07/02/2015   HGB 11.8* 07/02/2015   HCT 41.2 07/02/2015   MCV 95.6 07/02/2015   PLT 206 07/02/2015    Lab Results  Component Value Date   CREATININE 8.14* 07/01/2015   BUN 60* 07/01/2015   NA 134* 07/01/2015   K 5.5* 07/01/2015   CL 96* 07/01/2015   CO2 22 07/01/2015    Lab Results  Component Value Date   ALT 20 06/27/2015   AST 25 06/27/2015   ALKPHOS 284* 06/27/2015     Microbiology: Recent Results (from the past 240 hour(s))  Blood Culture (routine x 2)     Status: None   Collection Time: 06/27/15  3:05 PM  Result Value Ref Range Status   Specimen Description BLOOD RIGHT FOREARM  Final   Special Requests BOTTLES DRAWN AEROBIC AND ANAEROBIC 5CC  Final   Culture NO GROWTH 5 DAYS  Final   Report Status 07/02/2015 FINAL  Final    Blood Culture (routine x 2)     Status: None   Collection Time: 06/27/15  4:45 PM  Result Value Ref Range Status   Specimen Description BLOOD LEFT ANTECUBITAL  Final   Special Requests BOTTLES DRAWN AEROBIC AND ANAEROBIC 5CC  Final  Culture NO GROWTH 5 DAYS  Final   Report Status 07/02/2015 FINAL  Final  Surgical pcr screen     Status: None   Collection Time: 06/27/15  7:27 PM  Result Value Ref Range Status   MRSA, PCR NEGATIVE NEGATIVE Final   Staphylococcus aureus NEGATIVE NEGATIVE Final    Comment:        The Xpert SA Assay (FDA approved for NASAL specimens in patients over 13 years of age), is one component of a comprehensive surveillance program.  Test performance has been validated by [facility] for patients greater than or equal to 20 year old. It is not intended to diagnose infection nor to guide or monitor treatment.   Culture, blood (routine x 2)     Status: None (Preliminary result)   Collection Time: 07/01/15 12:15 PM  Result Value Ref Range Status   Specimen Description BLOOD HEMODIALYSIS CATHETER  Final   Special Requests BOTTLES DRAWN AEROBIC AND ANAEROBIC  10CC  Final   Culture NO GROWTH 1 DAY  Final   Report Status PENDING  Incomplete  Culture, blood (routine x 2)     Status: None (Preliminary result)   Collection Time: 07/01/15 12:45 PM  Result Value Ref Range Status   Specimen Description BLOOD HEMODIALYSIS CATHETER  Final   Special Requests BOTTLES DRAWN AEROBIC AND ANAEROBIC 10CC  Final   Culture NO GROWTH 1 DAY  Final   Report Status PENDING  Incomplete    COMER, Herbie [place], [place] for Infectious Disease [facility] [email or web address] [phone] pager  [phone] cell 07/02/2015, 4:02 PM

## 2015-07-02 NOTE — Progress Notes (Signed)
ANTIBIOTIC CONSULT NOTE - FOLLOW UP  Pharmacy Consult for Cipro and Vancomycin Indication: osteomyelitis of finger  Allergies  Allergen Reactions  . Zosyn [Piperacillin Sod-Tazobactam So] Shortness Of Breath    Passed out  . Amitriptyline Other (See Comments)    sleepy  . Benadryl [Diphenhydramine Hcl (Sleep)] Other (See Comments)    LBP  . Benadryl [Diphenhydramine Hcl]     Drop in BP  . Morphine     Pt is unsure   . Percocet [Oxycodone-Acetaminophen] Nausea And Vomiting  . Tylenol [Acetaminophen] Other (See Comments)    LBP  . Wellbutrin [Bupropion] Other (See Comments)    Bites nails .  Hallucinations     Patient Measurements: Height: 5\' 7"  (170.2 cm) Weight: 241 lb (109.317 kg) IBW/kg (Calculated) : 61.6  Vital Signs: Temp: 99.6 F (37.6 C) (11/01 1430) Temp Source: Oral (11/01 1430) BP: 89/47 mmHg (11/01 1430) Pulse Rate: 115 (11/01 1430)  Labs:  Recent Labs  06/30/15 0331 07/01/15 0814 07/02/15 0551  WBC 9.4 12.9* 11.2*  HGB 11.2* 10.3* 11.8*  PLT 179 177 206  CREATININE 6.26* 8.14*  --    Assessment: 49 y/o female with ESRD on HD MWF. Day #6 vancomycin, aztreonam, and metronidazole for cellulitis/L middle finger infection s/p amputation of L middle finger tip on 10/27.   Per ID, stop Aztreonam and Flagyl.  Plan Vanc and Cipro for 1 more week. Okay to use Cipro PO per Dr. Linus Salmons.  On Renvela TID with meals. Need to separate Cipro doses from Renvela doses by at least 4 hours, since Renvela can decrease absorption of Cipro and potentially decrease efficacy.  Goal of Therapy:  Pre-dialysis Vancomycin levels 15-25 mcg/ml appropriate Cipro dose for renal function and infection  Plan:   Cipro 500 mg PO q24hrs at 10pm, to separate from Revela doses, and also to be sure it's given after HD on HD days.  Continue Vancomycin 1 gram IV after HD on MWF.  Arty Baumgartner, [place] Pager: [phone] 07/02/2015,4:41 PM

## 2015-07-02 NOTE — Care Management Important Message (Signed)
Important Message  Patient Details  Name: [name] MRN: [id number] Date of Birth: August 22, 1966   Medicare Important Message Given:  Yes-second notification given    Nathen May 07/02/2015, 10:24 AM

## 2015-07-03 DIAGNOSIS — L8990 Pressure ulcer of unspecified site, unspecified stage: Secondary | ICD-10-CM

## 2015-07-03 DIAGNOSIS — A419 Sepsis, unspecified organism: Principal | ICD-10-CM

## 2015-07-03 DIAGNOSIS — E0841 Diabetes mellitus due to underlying condition with diabetic mononeuropathy: Secondary | ICD-10-CM

## 2015-07-03 LAB — GLUCOSE, CAPILLARY
GLUCOSE-CAPILLARY: 195 mg/dL — AB (ref 65–99)
Glucose-Capillary: 104 mg/dL — ABNORMAL HIGH (ref 65–99)
Glucose-Capillary: 131 mg/dL — ABNORMAL HIGH (ref 65–99)

## 2015-07-03 MED ORDER — CIPROFLOXACIN HCL 500 MG PO TABS: 500.0000 mg | ORAL_TABLET | ORAL | Status: DC

## 2015-07-03 MED ORDER — MIDODRINE HCL 5 MG PO TABS
ORAL_TABLET | ORAL | Status: AC
  Administered 2015-07-03: 20 mg via ORAL
  Filled 2015-07-03: qty 4

## 2015-07-03 MED ORDER — INSULIN ASPART 100 UNIT/ML FLEXPEN: 20.0000 [IU] | PEN_INJECTOR | Freq: Three times a day (TID) | SUBCUTANEOUS | Status: DC

## 2015-07-03 MED ORDER — MIDODRINE HCL 5 MG PO TABS
ORAL_TABLET | ORAL | Status: AC
  Filled 2015-07-03: qty 1

## 2015-07-03 NOTE — Progress Notes (Signed)
patient had 20 units of novolog ordered. Patient only wanted 10 units. 10 units administered and MD notified. Will continue to monitor.

## 2015-07-03 NOTE — Discharge Summary (Signed)
Physician Discharge Summary  [name]:[id number] DOB: 15-Dec-1965 DOA: 06/27/2015  PCP: Tonya Mannan, MD  Admit date: 06/27/2015 Discharge date: 07/03/2015  Time spent: 35 minutes  Recommendations for Outpatient Follow-up:  1. vanc with HD- through 11/9 2. "Milking" of finger TID 3. 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 (HCC)   MORBID OBESITY   Diabetes mellitus (HCC)   Dependent for wheelchair mobility   Decubitus skin ulcer   History of amputation   Burn of hand   End stage renal failure on dialysis [facility])   Infected finger   Discharge Condition: improved  Diet recommendation: renal/carb mod  Filed Weights   07/01/15 0800 07/01/15 1322 07/01/15 2226  Weight: 112.5 kg (248 lb 0.3 oz) 108.8 kg (239 lb 13.8 oz) 109.317 kg (241 lb)    History of present illness:  Patient is a 49 year old female with past medical history significant for end-stage renal disease on HD (M/W/F), multiple previous amputations, morbid obesity,peripheral vascular disease, GERD, peripheral neuropathy, and wheelchair-bound; who presents with complaints of worsening infection of her left middle finger. Patient reports that she had developed a blister on the digit after it had required splinting for the last 6 weeks following a burn. She was being followed by [facility] burn center. She states this blister was on the left 3rd distal phalanx palmar surface M.D. provider at the burn center performed an I&D about a week ago. She was placed on antibiotics of clindamycin, but reports that the blister area was becoming more red and swollen. Advised to come to emergency department for further evaluation. On the ED patient was started on vancomycin and Zosyn and plastic surgery was called. After patient had received Zosyn however she developed acute onset of shortness  of breath and became unresponsive. Patient notes that she never lost consciousness but required to go on a nonrebreather for some period in time for which she was admitted in to stepdown. Patient notes that she's never been on Zosyn previously before. Patient symptoms abate over the next 20 minutes and patient reports that she's fine now. Note on 06/13/2015 the patient was seen by Dr. Zenaida Niece [name] of infectious disease and she was supposed to have a MRI without contrast of the right foot tomorrow.  Hospital Course:  Sepsis/infected left middle finger  Secondary to an infected left middle finger. Patient with a lactic acid initially of 5.02 with a fever of 101F. -Blood cultures 2 -Patient is on vancomycin, cipro per ID -Incision and drainage done by Dr. Izora Ribas for the left third digit. -Spoke with Dr. Izora Ribas and he recommended to continue with milking of the finger. He does not think she needs I&D again.  Diarrhea Reportedly was on oral clindamycin prior to admission, had loose stools on admission.  C. Difficile negative  Diabetic foot ulcer  -Patient was previously seen by infectious disease on 06/13/2015 -MRI without contrast of the right foot showed no evidence of osteomyelitis.  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 Zosyn was placed on patient's allergy list as it appears that she had an respiratory distress  after receiving this medicine.   History of amputations    MORBID OBESITY: Stable  Procedures:    Consultations:  ID  Nephrology  Dr. Izora Ribas  Discharge Exam: Filed Vitals:   07/03/15 1041  BP: 100/85  Pulse: 79  Temp: 99.4 F (37.4 C)  Resp: 17    General: awka, anxious to go home   Discharge Instructions   Discharge Instructions    Discharge instructions    Complete by:  As directed   Milking of finger TID vanc with HD Renal/carb mod diet Wound care at [facility]     Increase activity slowly    Complete by:  As directed           Current Discharge Medication List    START taking these medications   Details  ciprofloxacin (CIPRO) 500 MG tablet Take 1 tablet (500 mg total) by mouth daily. Qty: 7 tablet, Refills: 0      CONTINUE these medications which have CHANGED   Details  insulin aspart (NOVOLOG FLEXPEN) 100 UNIT/ML FlexPen Inject 20 Units into the skin 3 (three) times daily with meals. And pen needles 3/day Qty: 10 pen, Refills: 11      CONTINUE these medications which have NOT CHANGED   Details  aspirin EC 81 MG tablet Take 81 mg by mouth at bedtime.    cinacalcet (SENSIPAR) 60 MG tablet Take 60 mg by mouth daily.      gabapentin (NEURONTIN) 100 MG tablet Take 100-200 mg by mouth 2 (two) times daily. 200 mg  in the a.m. And 100 mg  at night    LANTUS SOLOSTAR 100 UNIT/ML SOPN Inject 50 Units into the skin at bedtime.     loperamide (IMODIUM) 2 MG capsule Take 2 mg by mouth as needed for diarrhea or loose stools.    midodrine (PROAMATINE) 10 MG tablet Take 20 mg by mouth 3 (three) times daily. Take two 10 mg tabs 3 times a day    Multiple Vitamin (RENAL MULTIVITAMIN/ZINC) TABS Take 1 tablet by mouth at bedtime.     omeprazole (PRILOSEC) 20 MG capsule Take 20 mg by mouth at bedtime.     Probiotic Product (ALIGN PO) Take 1 capsule by mouth at bedtime.     sevelamer carbonate (RENVELA) 800 MG tablet Take 4,000 mg by mouth 3 (three) times  daily with meals.      STOP taking these medications     clindamycin (CLEOCIN) 300 MG capsule      doxycycline (VIBRAMYCIN) 100 MG capsule        Allergies  Allergen Reactions  . Zosyn [Piperacillin Sod-Tazobactam So] Shortness Of Breath    Passed out  . Amitriptyline Other (See Comments)    sleepy  . Benadryl [Diphenhydramine Hcl (Sleep)] Other (See Comments)    LBP  . Benadryl [Diphenhydramine Hcl]     Drop in BP  . Morphine     Pt is unsure   . Percocet [Oxycodone-Acetaminophen] Nausea And Vomiting  . Tylenol [Acetaminophen] Other (See Comments)    LBP  . Wellbutrin [Bupropion] Other (See Comments)    Bites nails .  Hallucinations    Follow-up Information    Follow up with Tonya Mannan, MD In 1 week.   Specialty:  Family Medicine   Contact information:   [address] [place] [address] [phone]       Follow up with [facility].   Specialty:  Wound Care   Contact information:   [address] [phone] ar [phone]       The results of significant diagnostics from this hospitalization (including imaging, microbiology, ancillary and laboratory) are listed below for reference.    Significant Diagnostic Studies: Mr Foot Right Wo Contrast  06/28/2015  CLINICAL DATA:  Foot ulceration of the third toe in a diabetic patient. History of prior amputations. Initial encounter. EXAM: MRI OF THE RIGHT FOREFOOT WITHOUT CONTRAST TECHNIQUE: Multiplanar, multisequence MR imaging was performed. No intravenous contrast was administered. COMPARISON:  Outside plain films of the right foot 05/21/2015. FINDINGS: Amputation at the level of the base of the first metatarsal and midshaft of the second metatarsal is seen as on the comparison plain films. There is no bone marrow signal abnormality or fluid collection about the third toe to suggest osteomyelitis or abscess. Mild subcutaneous edema is present over the dorsum of  the foot. Marrow edema is seen at the base of the fifth metatarsal with an associated T1 hypo intense line consistent with nondisplaced fracture. Marrow edema is also seen in the calcaneus at the calcaneocuboid joint and base of the first metatarsal remnant most consistent with degenerative disease. No mass is identified. Intrinsic musculature of the foot is fatty replaced. IMPRESSION: Negative for osteomyelitis or abscess. Subacute appearing nondisplaced fracture base of the fifth metatarsal. Status post amputation at the base of first metatarsal and midshaft of the second metatarsal. Electronically Signed   By: Drusilla Kanner M.D.   On: 06/28/2015 07:19   Dg Chest Portable 1 View  06/27/2015  CLINICAL DATA:  Chest pain for 1 day EXAM: PORTABLE CHEST 1 VIEW COMPARISON:  12/21/2014 FINDINGS: Moderate cardiomegaly. Lungs under aerated and grossly clear. No pneumothorax. No pleural effusion. IMPRESSION: Cardiomegaly without decompensation. Electronically Signed   By: Jolaine Click M.D.   On: 06/27/2015 16:16   Dg Finger Middle Left  06/27/2015  CLINICAL DATA:  Infected left middle finger wound for 1 week. EXAM: LEFT MIDDLE FINGER 2+V COMPARISON:  04/25/2015 FINDINGS: There is a comminuted fracture of the distal left third phalanx, with complete separation of the 2 fracture fragments. The third distal interphalangeal joint is disrupted with the proximal shaft of the distal third phalanx situated within the palmar soft tissues proximal to the DIP joint, and the tip of the third distal phalanx within the dorsal soft tissues at the level of the DIP joint. There is severe soft tissue swelling and soft tissue emphysema. Amputation of the second distal phalanx as seen. IMPRESSION: Comminuted fracture of the distal third phalanx with complete separation of the 2 fracture fragments, and severe dislocation at the DIP joint. Severe soft tissue swelling and soft tissue emphysema. Electronically Signed   By: Ted Mcalpine M.D.   On: 06/27/2015 15:57    Microbiology: Recent Results (from the past 240 hour(s))  Blood Culture (routine x 2)     Status: None   Collection Time: 06/27/15  3:05 PM  Result Value Ref Range Status   Specimen Description BLOOD RIGHT FOREARM  Final   Special Requests BOTTLES DRAWN AEROBIC AND ANAEROBIC 5CC  Final   Culture NO GROWTH 5 DAYS  Final   Report Status 07/02/2015 FINAL  Final  Blood Culture (routine x 2)     Status:  None   Collection Time: 06/27/15  4:45 PM  Result Value Ref Range Status   Specimen Description BLOOD LEFT ANTECUBITAL  Final   Special Requests BOTTLES DRAWN AEROBIC AND ANAEROBIC 5CC  Final   Culture NO GROWTH 5 DAYS  Final   Report Status 07/02/2015 FINAL  Final  Surgical pcr screen     Status: None   Collection Time: 06/27/15  7:27 PM  Result Value Ref Range Status   MRSA, PCR NEGATIVE NEGATIVE Final   Staphylococcus aureus NEGATIVE NEGATIVE Final    Comment:        The Xpert SA Assay (FDA approved for NASAL specimens in patients over 14 years of age), is one component of a comprehensive surveillance program.  Test performance has been validated by [facility] for patients greater than or equal to 19 year old. It is not intended to diagnose infection nor to guide or monitor treatment.   Culture, blood (routine x 2)     Status: None (Preliminary result)   Collection Time: 07/01/15 12:15 PM  Result Value Ref Range Status   Specimen Description BLOOD HEMODIALYSIS CATHETER  Final   Special Requests BOTTLES DRAWN AEROBIC AND ANAEROBIC  10CC  Final   Culture NO GROWTH 1 DAY  Final   Report Status PENDING  Incomplete  Culture, blood (routine x 2)     Status: None (Preliminary result)   Collection Time: 07/01/15 12:45 PM  Result Value Ref Range Status   Specimen Description BLOOD HEMODIALYSIS CATHETER  Final   Special Requests BOTTLES DRAWN AEROBIC AND ANAEROBIC 10CC  Final   Culture NO GROWTH 1 DAY  Final   Report Status PENDING   Incomplete  C difficile quick scan w PCR reflex     Status: None   Collection Time: 07/02/15  4:10 PM  Result Value Ref Range Status   C Diff antigen NEGATIVE NEGATIVE Final   C Diff toxin NEGATIVE NEGATIVE Final   C Diff interpretation Negative for toxigenic C. difficile  Final     Labs: Basic Metabolic Panel:  Recent Labs Lab 06/27/15 1414 06/28/15 0540 06/28/15 2106 06/30/15 0331 07/01/15 0814  NA 137  --  139 137 134*  K 4.6  --  3.4* 4.1 5.5*  CL 89*  --  95* 95* 96*  CO2 27  --  30 26 22   GLUCOSE 286*  --  129* 125* 349*  BUN 36*  --  12 38* 60*  CREATININE 5.71*  --  2.84* 6.26* 8.14*  CALCIUM 9.1  --  8.8* 8.4* 8.5*  PHOS  --  6.9* 3.4 6.7* 7.2*   Liver Function Tests:  Recent Labs Lab 06/27/15 1414 06/28/15 2106 06/30/15 0331 07/01/15 0814  AST 25  --   --   --   ALT 20  --   --   --   ALKPHOS 284*  --   --   --   BILITOT 0.9  --   --   --   PROT 6.9  --   --   --   ALBUMIN 3.1* 2.8* 2.5* 2.4*   No results for input(s): LIPASE, AMYLASE in the last 168 hours. No results for input(s): AMMONIA in the last 168 hours. CBC:  Recent Labs Lab 06/27/15 1414 06/28/15 2106 06/30/15 0331 07/01/15 0814 07/02/15 0551  WBC 10.2 7.7 9.4 12.9* 11.2*  NEUTROABS 8.4*  --   --   --   --   HGB 12.8 11.5* 11.2* 10.3* 11.8*  HCT  43.6 39.6 39.4 35.9* 41.2  MCV 94.2 93.2 94.5 94.2 95.6  PLT 204 163 179 177 206   Cardiac Enzymes:  Recent Labs Lab 06/27/15 1645  TROPONINI <0.03   BNP: BNP (last 3 results) No results for input(s): BNP in the last 8760 hours.  ProBNP (last 3 results) No results for input(s): PROBNP in the last 8760 hours.  CBG:  Recent Labs Lab 07/02/15 1148 07/02/15 1714 07/02/15 2156 07/03/15 0734 07/03/15 1136  GLUCAP 194* 140* 130* 104* 195*       Signed:  Forest Redwine  [facility] 07/03/2015, 12:47 PM

## 2015-07-03 NOTE — Progress Notes (Signed)
07/03/2015 6:46 PM  Reviewed discharge information with patient, including medications, prescriptions, diet/activity/orders, patient education on diabetes, follow up appointments, etc.  Pt verbalized understanding.  IV removed.  Husband has already packed patient up, they are ready to go.  Security notified to assist with getting car out of [place].  Husband escorted wife down to the [employer]	[place].  Pt took belongings with her.   Princella Pellegrini

## 2015-07-03 NOTE — Progress Notes (Signed)
[facility] Progress Note  Assessment/Plan: 1. 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 ID following- recheck CBC pre HD today; discharge on IV Vanc plus oral cipro per ID recommendation 2. ESRD - MWF -- net UF Friday 3.1 with post weight 106.8; Monday only able to UF 2.85 with post weight of 108.8. - Plan HD today - UF as BP allows 3. Hypotension/volume midodrine for BP support pre and mid HD treatment- keep systolic BP >49 4. Anemia - Hgb 12 >11.2 > 10.3 Then up to 11.8 no ESA or Fe for months despite low tsat and even ferritin of 123, which is low considering chronic wounds- follow Hgb- continue to follow 5. Metabolic bone disease - P runs high - ? Diet +/- c/w binders- P variable in hospital diet - is eating some food from the outside. Calcitriol just increased. Continue sensipar 60 and 5 renvela ac 6. Nutrition - changed to renal carb mod diet due to ^ K content of heart healthy diet/vitamin.  7. DM - per primary 8. Chronic wounds - left 3rd toe, right buttock 5 x 5 cm unstagable per WOC RN- followed by [facility] wound clinic- care as outlined by RN 9. 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 Diarrhea vs loose stools - high risk for Cdiff - on clindamycin prior to admission. Says stools are firming - just goes everytimes she eats and now eating more since Monday 11. Self mutilation - bites nails/fingertips raw- discussed medication/counseling options; didn't tolerate wellbutrin. She needs cognitive psychotherapy for her severe anxiety. She has a lot of insight into etiology; continue to encourage her to pursue this. 12. Disp -plan d/c today  Tonya Jacobson, PA-C [place] 07/03/2015,9:20 AM  LOS: 6 days   Pt seen, examined and agree w A/P as above.  [name] MD [facility] pager [phone]    cell [phone] 07/03/2015, 4:41 PM    Subjective:   No new issues  Objective Filed Vitals:   07/02/15 1430 07/02/15 2022 07/03/15 0248 07/03/15 0500  BP: 89/47 84/39  95/39  Pulse: 115 57  67  Temp: 99.6 F (37.6 C) 98.2 F (36.8 C) 99.1 F (37.3 C) 100.5 F (38.1 C)  TempSrc: Oral Oral Oral Oral  Resp: 18 16  16   Height:      Weight:      SpO2: 90% 91%  96%   Physical Exam on bedpan General: talkative Heart: RRR Lungs: no rales Abdomen: obese Extremities: tr LE edema left 3rd finger wrapped; finger tips raw from chewing Dialysis Access: left thigh AVGG   Dialysis Orders: [facility] 5 hr MWF 180 400/800 EDW 108 - leaving below 107.5 or lower 2 K 2 Ca no profile Left thigh graft heparin 10000 with 7500 mid treatment (VERY HIGH) calcitriol 0.75 just increased. No ESA or Fe Recent labs: Hgb 12.2 11% sat ferritin 123 (surprisingly low) iPTH 796   Additional Objective Labs: Basic Metabolic Panel:  Recent Labs Lab 06/28/15 2106 06/30/15 0331 07/01/15 0814  NA 139 137 134*  K 3.4* 4.1 5.5*  CL 95* 95* 96*  CO2 30 26 22   GLUCOSE 129* 125* 349*  BUN 12 38* 60*  CREATININE 2.84* 6.26* 8.14*  CALCIUM 8.8* 8.4* 8.5*  PHOS 3.4 6.7* 7.2*   Liver Function Tests:  Recent Labs Lab 06/27/15 1414 06/28/15 2106 06/30/15 0331 07/01/15 0814  AST 25  --   --   --   ALT 20  --   --   --   ALKPHOS 284*  --   --   --   BILITOT 0.9  --   --   --   PROT 6.9  --   --   --   ALBUMIN 3.1* 2.8* 2.5* 2.4*   CBC:  Recent Labs Lab 06/27/15 1414 06/28/15 2106 06/30/15 0331 07/01/15 0814 07/02/15 0551  WBC 10.2 7.7 9.4 12.9* 11.2*  NEUTROABS 8.4*  --   --   --   --   HGB 12.8 11.5* 11.2* 10.3* 11.8*  HCT 43.6 39.6 39.4 35.9* 41.2  MCV 94.2 93.2 94.5 94.2 95.6  PLT 204 163 179 177 206   Blood Culture     Component Value Date/Time   SDES BLOOD HEMODIALYSIS CATHETER 07/01/2015 1245   SPECREQUEST BOTTLES DRAWN AEROBIC AND ANAEROBIC 10CC 07/01/2015 1245   CULT NO GROWTH 1 DAY 07/01/2015 1245   REPTSTATUS PENDING 07/01/2015 1245    Cardiac Enzymes:  Recent Labs Lab 06/27/15 1645  TROPONINI <0.03   CBG:  Recent Labs Lab 07/02/15 0838 07/02/15 1148 07/02/15 1714 07/02/15 2156 07/03/15 0734  GLUCAP 254* 194* 140* 130* 104*   IrMedications:   . acidophilus  1 capsule Oral QHS  . aspirin EC  81 mg Oral QHS  . calcitRIOL  0.75 mcg Oral Q M,W,F-HD  . cinacalcet  60 mg Oral Q breakfast  . ciprofloxacin  500 mg Oral Q24H  . gabapentin  100 mg Oral QHS  . gabapentin  200 mg Oral Daily  . insulin aspart  20 Units Subcutaneous TID WC  . insulin glargine  50 Units Subcutaneous QHS  . midodrine  20 mg Oral TID WC  . multivitamin  1 tablet Oral QHS  . pantoprazole  40 mg Oral Daily  . pneumococcal 23 valent vaccine  0.5 mL Intramuscular Tomorrow-1000  . sevelamer carbonate  4,000 mg Oral TID WC  . silver sulfADIAZINE   Topical Daily  . sodium chloride  500 mL Intravenous Once  . vancomycin  1,000 mg Intravenous Q M,W,F-HD

## 2015-07-04 ENCOUNTER — Telehealth: Payer: Self-pay | Admitting: *Deleted

## 2015-07-04 NOTE — Telephone Encounter (Signed)
Transitional care call made: discharged from [facility] 11/02 with sepsis related to finger infection.  Patient is doing well.  She is scheduled to follow up with [facility].  She declines follow up with PCP at this time.  Patient instructed to call our office as needed for new or worsening symptoms.

## 2015-07-05 ENCOUNTER — Telehealth: Payer: Self-pay | Admitting: Family Medicine

## 2015-07-05 NOTE — Telephone Encounter (Signed)
Pt called wanting to know when dr Deborra Medina is going to complete paperwork for her power wheel chair

## 2015-07-05 NOTE — Telephone Encounter (Signed)
Will route to [place].  Unsure what she is referring to.

## 2015-07-05 NOTE — Progress Notes (Signed)
Patient called and stated that her hemodialysis center did not orders for vancomycin during her treatments.  PA with [facility] notified.  Jillyn Ledger, MBA, BS, RN

## 2015-07-05 NOTE — Telephone Encounter (Signed)
i have not received any additional paperwork for her, outside of what was completed in August at her OV

## 2015-07-05 NOTE — Telephone Encounter (Signed)
Lm on pts vm and advised of comments below. Advised pt to contact office should additional paperwork need to be completed

## 2015-07-05 NOTE — Telephone Encounter (Signed)
Please let pt know this

## 2015-07-06 LAB — CULTURE, BLOOD (ROUTINE X 2)
Culture: NO GROWTH
Culture: NO GROWTH

## 2015-07-09 ENCOUNTER — Ambulatory Visit: Payer: Medicare Other | Admitting: Podiatry

## 2015-07-11 ENCOUNTER — Encounter: Payer: Medicare Other | Attending: Surgery | Admitting: Surgery

## 2015-07-11 DIAGNOSIS — Z992 Dependence on renal dialysis: Secondary | ICD-10-CM | POA: Diagnosis not present

## 2015-07-11 DIAGNOSIS — E1140 Type 2 diabetes mellitus with diabetic neuropathy, unspecified: Secondary | ICD-10-CM | POA: Diagnosis not present

## 2015-07-11 DIAGNOSIS — E1122 Type 2 diabetes mellitus with diabetic chronic kidney disease: Secondary | ICD-10-CM | POA: Insufficient documentation

## 2015-07-11 DIAGNOSIS — G4733 Obstructive sleep apnea (adult) (pediatric): Secondary | ICD-10-CM | POA: Insufficient documentation

## 2015-07-11 DIAGNOSIS — I2510 Atherosclerotic heart disease of native coronary artery without angina pectoris: Secondary | ICD-10-CM | POA: Diagnosis not present

## 2015-07-11 DIAGNOSIS — L89310 Pressure ulcer of right buttock, unstageable: Secondary | ICD-10-CM | POA: Insufficient documentation

## 2015-07-11 DIAGNOSIS — Z794 Long term (current) use of insulin: Secondary | ICD-10-CM | POA: Diagnosis not present

## 2015-07-11 DIAGNOSIS — X58XXXA Exposure to other specified factors, initial encounter: Secondary | ICD-10-CM | POA: Insufficient documentation

## 2015-07-11 DIAGNOSIS — S61303A Unspecified open wound of left middle finger with damage to nail, initial encounter: Secondary | ICD-10-CM | POA: Diagnosis not present

## 2015-07-11 DIAGNOSIS — S61301A Unspecified open wound of left index finger with damage to nail, initial encounter: Secondary | ICD-10-CM | POA: Diagnosis not present

## 2015-07-11 DIAGNOSIS — S61300A Unspecified open wound of right index finger with damage to nail, initial encounter: Secondary | ICD-10-CM | POA: Insufficient documentation

## 2015-07-11 DIAGNOSIS — N186 End stage renal disease: Secondary | ICD-10-CM | POA: Diagnosis not present

## 2015-07-11 DIAGNOSIS — S61307A Unspecified open wound of left little finger with damage to nail, initial encounter: Secondary | ICD-10-CM | POA: Insufficient documentation

## 2015-07-11 NOTE — Progress Notes (Addendum)
Tonya, [name] ([id number]) Visit Report for 07/11/2015 Chief Complaint Document Details Patient Name: [name], [name] Date of Service: 07/11/2015 8:00 AM Medical Record Number: [id number] Patient Account Number: [id number] Date of Birth/Sex: 1965-10-13 (49 y.o. Female) Treating RN: Montey Hora Primary Care Physician: Arnette Norris Other Clinician: Referring Physician: Arnette Norris Treating Physician/Extender: Frann Rider in Treatment: 11 Information Obtained from: Patient Chief Complaint Patient presents to the wound care center for a consult due non healing wound The patient has one area on her right gluteal region and several fingers involved with ulcerations. This has been there for about 3 months. Electronic Signature(s) Signed: 07/11/2015 9:00:35 AM By: [name] MD, FACS Entered By: [name] on 07/11/2015 09:00:35 [name] ([id number]) -------------------------------------------------------------------------------- Debridement Details Patient Name: [name] Date of Service: 07/11/2015 8:00 AM Medical Record Number: [id number] Patient Account Number: [id number] Date of Birth/Sex: [birth year] (49 y.o. Female) Treating RN: Montey Hora Primary Care Physician: Arnette Norris Other Clinician: Referring Physician: Arnette Norris Treating Physician/Extender: Frann Rider in Treatment: 11 Debridement Performed for Wound #22 Left Hand - Palm Assessment: Performed By: Physician [name], MD Debridement: Debridement Pre-procedure Yes Verification/Time Out Taken: Start Time: 08:53 Pain Control: Lidocaine 4% Topical Solution Level: Skin/Subcutaneous Tissue/Muscle Total Area Debrided (L x 0.5 (cm) x 1 (cm) = 0.5 (cm) W): Tissue and other Viable, Non-Viable, Eschar, Tendon material debrided: Instrument: Scissors Bleeding: Minimum Hemostasis Achieved: Pressure End Time: 08:54 Procedural Pain: 0 Post Procedural Pain:  0 Response to Treatment: Procedure was tolerated well Post Debridement Measurements of Total Wound Length: (cm) 0.5 Width: (cm) 1 Depth: (cm) 0.1 Volume: (cm) 0.039 Post Procedure Diagnosis Same as Pre-procedure Electronic Signature(s) Signed: 07/11/2015 9:00:29 AM By: [name] MD, FACS Signed: 07/11/2015 5:07:12 PM By: Montey Hora Entered By: [name] on 07/11/2015 09:00:29 [name] ([id number]) -------------------------------------------------------------------------------- HPI Details Patient Name: [name] Date of Service: 07/11/2015 8:00 AM Medical Record Number: [id number] Patient Account Number: [id number] Date of Birth/Sex: [birth year] (49 y.o. Female) Treating RN: Montey Hora Primary Care Physician: Arnette Norris Other Clinician: Referring Physician: Arnette Norris Treating Physician/Extender: Frann Rider in Treatment: 11 History of Present Illness Location: right gluteal area and 3 fingers on her right hand and 4 fingers on her left hand involved with ulceration. Quality: Patient reports No Pain. Severity: Patient states wound are getting worse. Duration: Patient has had the wound for > 3 months prior to seeking treatment at the wound center Context: The wound appeared gradually over time Modifying Factors: Patient is currently on renal dialysis and receives treatments 3 times weekly HPI Description: Pleasant 49 year old patient who is known to the wound clinic from previous visits. She has significant diabetic neuropathy and has no sensation in her hands and feet. She is wheelchair-bound and takes dialysis on a regular basis 3 times a week. 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 Past medical history significant for diabetes mellitus,end-stage renal disease on hemodialysis, coronary artery  disease, multiple amputations, sleep apnea, wheelchair bound due to severe peripheral neuropathy. She has now declined quite significantly and is wheelchair-bound all the time. She has developed decubitus ulcers of the skin and has been referred to [place] by her PCP. She was also recently seen by Dr. Celesta Gentile for her feet and he noticed that she had a ulceration of the right third toe and put her on Levaquin  and local care. She says she is laying down in bed much more now and has been trying to offload as much as possible. Due to a psychiatric issue she has been biting her fingernails and has caused much ulceration to both hands. Part of it may be related to the medication she was on. 05/14/2015 -- she says she is very sore around her anus and the vagina and has a lot of redness and problems there. I have reviewed her x-rays which were done of both hands and they show: X-ray of the left hand shows no acute fracture, dislocation or convincing osteomyelitis. X-ray of the right hand shows deformities of the distal phalanx of the first second and third digits with peripheral vascular disease but no evidence of acute fracture dislocation or osteomyelitis. 05/28/2015 -- she has seen the dermatologist and will also be seeing the infectious disease doctor soon and is on a proper regimen for her multiple ailments. 06/27/2015 -- she has been wearing a splint on her left hand and last week she had a blister and bleb on the left middle finger which was debrided by the burn center at [facility]. Since then she's been having a lot of pain and swelling. She has been worse for the last 3-4 days. 07/11/2015 - she was admitted to [facility] for a badly infected left middle finger infection with sepsis and was admitted between 06/27/2015 to 07/03/2015. She was discharged home on vancomycin and supportive care. during her hospital stay infectious disease treated her with vancomycin and Cipro and an incision  and drainage was done of the left third digit. The procedure was done on 06/27/2015 and consisted of an KARRAN, FELIPE. ([id number]) amputation of the tip of the left long finger at the middle phalanx level irrigation and debridement of full- thickness skin and subcutaneous tissue and tendon.An MRI of the right foot showed no evidence of osteomyelitis. Electronic Signature(s) Signed: 07/11/2015 9:00:48 AM By: [name] MD, FACS Previous Signature: 07/11/2015 8:25:29 AM Version By: [name] MD, FACS Previous Signature: 07/11/2015 8:14:25 AM Version By: [name] MD, FACS Entered By: [name] on 07/11/2015 09:00:48 CIRI, ESTRELA ([id number]) -------------------------------------------------------------------------------- Physical Exam Details Patient Name: [name] Date of Service: 07/11/2015 8:00 AM Medical Record Number: [id number] Patient Account Number: [id number] Date of Birth/Sex: 01-31-1966 (49 y.o. Female) Treating RN: Montey Hora Primary Care Physician: Arnette Norris Other Clinician: Referring Physician: Arnette Norris Treating Physician/Extender: Frann Rider in Treatment: 11 Constitutional . Pulse regular. Respirations normal and unlabored. Afebrile. . Eyes Nonicteric. Reactive to light. Ears, Nose, Mouth, and Throat Lips, teeth, and gums WNL.Marland Kitchen Moist mucosa without lesions . Neck supple and nontender. No palpable supraclavicular or cervical adenopathy. Normal sized without goiter. Respiratory WNL. No retractions.. Cardiovascular Pedal Pulses WNL. No clubbing, cyanosis or edema. Gastrointestinal (GI) Abdomen without masses or tenderness.. No liver or spleen enlargement or tenderness.. Genitourinary (GU) No hydrocele, spermatocele, tenderness of the cord, or testicular mass.Marland Kitchen Penis without lesions.[name] without lesions. No cystocele, or rectocele. Pelvic support intact, no discharge. Marland Kitchen Urethra without masses, tenderness  or scarring.Marland Kitchen Lymphatic No adneopathy. No adenopathy. No adenopathy. Musculoskeletal Adexa without tenderness or enlargement.. Digits and nails w/o clubbing, cyanosis, infection, petechiae, ischemia, or inflammatory conditions.. Integumentary (Hair, Skin) No suspicious lesions. No crepitus or fluctuance. No peri-wound warmth or erythema. No masses.Marland Kitchen Psychiatric Judgement and insight Intact.. No evidence of depression, anxiety, or agitation.. Notes the left middle finger amputation site is healing well and there is a  suture in place. On the palmar aspect of the left hand there is a incision and through it is protruding a large amount of tendon sheath and tendon which is laying at the mouth of the wound. I believe this is the proximal part of the tendon from the middle finger which is herniated through the incision. This is in need of sharp debridement. Electronic Signature(s) Signed: 07/11/2015 9:02:22 AM By: [name] MD, FACS KEARRA, MCKENNA ([id number]) Entered By: [name] on 07/11/2015 09:02:22 FLEURETTE, GRAFFEO ([id number]) -------------------------------------------------------------------------------- Physician Orders Details Patient Name: [name] Date of Service: 07/11/2015 8:00 AM Medical Record Number: [id number] Patient Account Number: [id number] Date of Birth/Sex: 10-17-1965 (49 y.o. Female) Treating RN: Montey Hora Primary Care Physician: Arnette Norris Other Clinician: Referring Physician: Arnette Norris Treating Physician/Extender: Frann Rider in Treatment: 11 Verbal / Phone Orders: Yes Clinician: Montey Hora Read Back and Verified: Yes Diagnosis Coding Wound Cleansing Wound #12 Right Hand - 1st Digit o Clean wound with Normal Saline. Wound #14 Left Hand - 1st Digit o Clean wound with Normal Saline. Wound #15 Left Hand - 2nd Digit o Clean wound with Normal Saline. Wound #17 Left Hand - 4th Digit o Clean wound with  Normal Saline. Wound #19 Right Hand - 4th Digit o Clean wound with Normal Saline. Wound #20 Right Hand - 5th Digit o Clean wound with Normal Saline. Wound #21 Left Amputation Site - Digit o Clean wound with Normal Saline. Wound #22 Left Hand - Palm o Clean wound with Normal Saline. Primary Wound Dressing Wound #12 Right Hand - 1st Digit o Other: - betadine Wound #14 Left Hand - 1st Digit o Other: - betadine Wound #15 Left Hand - 2nd Digit o Other: - betadine Wound #17 Left Hand - 4th Digit o Other: - betadine Prezioso, Taygen R. ([id number]) Wound #19 Right Hand - 4th Digit o Other: - betadine Wound #20 Right Hand - 5th Digit o Other: - betadine Wound #22 Left Hand - Palm o Iodoform packing Gauze Wound #21 Left Amputation Site - Digit o Xeroform Secondary Dressing Wound #21 Left Amputation Site - Digit o Gauze and Kerlix/Conform Wound #22 Left Hand - Palm o Gauze and Kerlix/Conform Dressing Change Frequency Wound #12 Right Hand - 1st Digit o Change dressing every day. Wound #14 Left Hand - 1st Digit o Change dressing every day. Wound #15 Left Hand - 2nd Digit o Change dressing every day. Wound #17 Left Hand - 4th Digit o Change dressing every day. Wound #19 Right Hand - 4th Digit o Change dressing every day. Wound #20 Right Hand - 5th Digit o Change dressing every day. Wound #21 Left Amputation Site - Digit o Other: - three times daily Wound #22 Left Hand - Palm o Other: - three times daily Follow-up Appointments Wound #12 Right Hand - 1st Digit o Return Appointment in 1 week. CLAUDEAN, SIEMSEN ([id number]) Wound #14 Left Hand - 1st Digit o Return Appointment in 1 week. Wound #15 Left Hand - 2nd Digit o Return Appointment in 1 week. Wound #17 Left Hand - 4th Digit o Return Appointment in 1 week. Wound #19 Right Hand - 4th Digit o Return Appointment in 1 week. Wound #20 Right Hand - 5th Digit o  Return Appointment in 1 week. Wound #21 Left Amputation Site - Digit o Return Appointment in 1 week. Wound #22 Left Hand - Palm o Return Appointment in 1 week. Electronic Signature(s) Signed: 07/11/2015 4:04:17 PM By: [name] MD, FACS  Signed: 07/11/2015 5:07:12 PM By: Montey Hora Entered By: Montey Hora on 07/11/2015 08:54:48 [name] ([id number]) -------------------------------------------------------------------------------- Problem List Details Patient Name: [name] Date of Service: 07/11/2015 8:00 AM Medical Record Number: [id number] Patient Account Number: [id number] Date of Birth/Sex: 1966-06-29 (49 y.o. Female) Treating RN: Montey Hora Primary Care Physician: Arnette Norris Other Clinician: Referring Physician: Arnette Norris Treating Physician/Extender: Frann Rider in Treatment: 11 Active Problems ICD-10 Encounter Code Description Active Date Diagnosis E11.40 Type 2 diabetes mellitus with diabetic neuropathy, 04/23/2015 Yes unspecified Z99.2 Dependence on renal dialysis 04/23/2015 Yes L89.310 Pressure ulcer of right buttock, unstageable 04/23/2015 Yes S61.300A Unspecified open wound of right index finger with damage 04/23/2015 Yes to nail, initial encounter S61.301A Unspecified open wound of left index finger with damage 04/23/2015 Yes to nail, initial encounter S61.307A Unspecified open wound of left little finger with damage to 04/23/2015 Yes nail, initial encounter S61.303A Unspecified open wound of left middle finger with damage 04/23/2015 Yes to nail, initial encounter Inactive Problems Resolved Problems Electronic Signature(s) Signed: 07/11/2015 8:59:55 AM By: [name] MD, FACS Entered By: [name] on 07/11/2015 08:59:55 [name] ([id number] [name] ([id number]) -------------------------------------------------------------------------------- Progress Note Details Patient Name:  [name] Date of Service: 07/11/2015 8:00 AM Medical Record Number: [id number] Patient Account Number: [id number] Date of Birth/Sex: Sep 26, 1965 (49 y.o. Female) Treating RN: Montey Hora Primary Care Physician: Arnette Norris Other Clinician: Referring Physician: Arnette Norris Treating Physician/Extender: Frann Rider in Treatment: 11 Subjective Chief Complaint Information obtained from Patient Patient presents to the wound care center for a consult due non healing wound The patient has one area on her right gluteal region and several fingers involved with ulcerations. This has been there for about 3 months. History of Present Illness (HPI) The following HPI elements were documented for the patient's wound: Location: right gluteal area and 3 fingers on her right hand and 4 fingers on her left hand involved with ulceration. Quality: Patient reports No Pain. Severity: Patient states wound are getting worse. Duration: Patient has had the wound for > 3 months prior to seeking treatment at the wound center Context: The wound appeared gradually over time Modifying Factors: Patient is currently on renal dialysis and receives treatments 3 times weekly Pleasant 49 year old patient who is known to the wound clinic from previous visits. She has significant diabetic neuropathy and has no sensation in her hands and feet. She is wheelchair-bound and takes dialysis on a regular basis 3 times a week. 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 Past medical history significant for diabetes mellitus,end-stage renal disease on hemodialysis, coronary artery disease, multiple amputations, sleep apnea, wheelchair bound due to severe peripheral neuropathy. She has now declined quite significantly and is wheelchair-bound all the time. She has developed decubitus ulcers  of the skin and has been referred to [place] by her PCP. She was also recently seen by Dr. Celesta Gentile for her feet and he noticed that she had a ulceration of the right third toe and put her on Levaquin and local care. She says she is laying down in bed much more now and has been trying to offload as much as possible. Due to a psychiatric issue she has been biting her fingernails and has caused much ulceration to both hands. Part of it may be related to the medication she was on. 05/14/2015 -- she  says she is very sore around her anus and the vagina and has a lot of redness and problems there. I have reviewed her x-rays which were done of both hands and they show: X-ray of the left hand shows no acute fracture, dislocation or convincing osteomyelitis. X-ray of the right hand shows deformities of the distal phalanx of the first second and third digits with peripheral vascular disease but no evidence of acute fracture dislocation or osteomyelitis. KIHANA, GLADWIN ([id number]) 05/28/2015 -- she has seen the dermatologist and will also be seeing the infectious disease doctor soon and is on a proper regimen for her multiple ailments. 06/27/2015 -- she has been wearing a splint on her left hand and last week she had a blister and bleb on the left middle finger which was debrided by the burn center at [facility]. Since then she's been having a lot of pain and swelling. She has been worse for the last 3-4 days. 07/11/2015 - she was admitted to [facility] for a badly infected left middle finger infection with sepsis and was admitted between 06/27/2015 to 07/03/2015. She was discharged home on vancomycin and supportive care. during her hospital stay infectious disease treated her with vancomycin and Cipro and an incision and drainage was done of the left third digit. The procedure was done on 06/27/2015 and consisted of an amputation of the tip of the left long finger at the middle phalanx level  irrigation and debridement of full- thickness skin and subcutaneous tissue and tendon.An MRI of the right foot showed no evidence of osteomyelitis. Objective Constitutional Pulse regular. Respirations normal and unlabored. Afebrile. Vitals Time Taken: 8:18 AM, Height: 68 in, Weight: 220 lbs, BMI: 33.4, Temperature: 98.0 F, Pulse: 63 bpm, Respiratory Rate: 18 breaths/min, Blood Pressure: 111/57 mmHg. Eyes Nonicteric. Reactive to light. Ears, Nose, Mouth, and Throat Lips, teeth, and gums WNL.Marland Kitchen Moist mucosa without lesions . Neck supple and nontender. No palpable supraclavicular or cervical adenopathy. Normal sized without goiter. Respiratory WNL. No retractions.. Cardiovascular Pedal Pulses WNL. No clubbing, cyanosis or edema. Gastrointestinal (GI) Abdomen without masses or tenderness.. No liver or spleen enlargement or tenderness.. Genitourinary (GU) No hydrocele, spermatocele, tenderness of the cord, or testicular mass.Marland Kitchen Penis without lesions.[name] without lesions. No cystocele, or rectocele. Pelvic support intact, no discharge. Marland Kitchen Urethra without masses, tenderness or scarring.Marland Kitchen JAZLIN, LOUCH R. ([id number]) Lymphatic No adneopathy. No adenopathy. No adenopathy. Musculoskeletal Adexa without tenderness or enlargement.. Digits and nails w/o clubbing, cyanosis, infection, petechiae, ischemia, or inflammatory conditions.Marland Kitchen Psychiatric Judgement and insight Intact.. No evidence of depression, anxiety, or agitation.. General Notes: the left middle finger amputation site is healing well and there is a suture in place. On the palmar aspect of the left hand there is a incision and through it is protruding a large amount of tendon sheath and tendon which is laying at the mouth of the wound. I believe this is the proximal part of the tendon from the middle finger which is herniated through the incision. This is in need of sharp debridement. Integumentary (Hair, Skin) No  suspicious lesions. No crepitus or fluctuance. No peri-wound warmth or erythema. No masses.. Wound #10 status is Healed - Epithelialized. Original cause of wound was Pressure Injury. The wound is located on the Right Sacrum. The wound measures 0cm length x 0cm width x 0cm depth; 0cm^2 area and 0cm^3 volume. Wound #12 status is Open. Original cause of wound was Trauma. The wound is located on the Right Hand - 1st Digit.  The wound measures 1cm length x 1.4cm width x 0.1cm depth; 1.1cm^2 area and 0.11cm^3 volume. The wound is limited to skin breakdown. There is no tunneling or undermining noted. There is a none present amount of drainage noted. The wound margin is flat and intact. There is medium (34-66%) red granulation within the wound bed. There is a medium (34-66%) amount of necrotic tissue within the wound bed including Eschar. The periwound skin appearance exhibited: Dry/Scaly. The periwound skin appearance did not exhibit: Callus, Crepitus, Excoriation, Fluctuance, Friable, Induration, Localized Edema, Rash, Scarring, Maceration, Moist, Atrophie Blanche, Cyanosis, Ecchymosis, Hemosiderin Staining, Mottled, Pallor, Rubor, Erythema. Periwound temperature was noted as No Abnormality. Wound #14 status is Open. Original cause of wound was Trauma. The wound is located on the Left Hand - 1st Digit. The wound measures 1.5cm length x 1cm width x 0.1cm depth; 1.178cm^2 area and 0.118cm^3 volume. The wound is limited to skin breakdown. There is no tunneling or undermining noted. There is a none present amount of drainage noted. The wound margin is flat and intact. There is small (1-33%) granulation within the wound bed. There is a large (67-100%) amount of necrotic tissue within the wound bed including Eschar. The periwound skin appearance exhibited: Dry/Scaly. The periwound skin appearance did not exhibit: Callus, Crepitus, Excoriation, Fluctuance, Friable, Induration, Localized Edema, Rash, Scarring,  Maceration, Moist, Atrophie Blanche, Cyanosis, Ecchymosis, Hemosiderin Staining, Mottled, Pallor, Rubor, Erythema. Periwound temperature was noted as No Abnormality. Wound #15 status is Open. Original cause of wound was Trauma. The wound is located on the Left Hand - 2nd Digit. The wound measures 0.8cm length x 1cm width x 0.1cm depth; 0.628cm^2 area and 0.063cm^3 volume. The wound is limited to skin breakdown. There is a none present amount of drainage noted. The wound margin is distinct with the outline attached to the wound base. There is no granulation within the wound bed. There is a large (67-100%) amount of necrotic tissue within the wound bed including Eschar. The periwound skin appearance exhibited: Dry/Scaly. The periwound skin appearance did not exhibit: Callus, Crepitus, Excoriation, Fluctuance, Friable, Induration, Localized Edema, Rash, Scarring, Manasco, Nitisha R. ([id number]) Maceration, Moist, Atrophie Blanche, Cyanosis, Ecchymosis, Hemosiderin Staining, Mottled, Pallor, Rubor, Erythema. Periwound temperature was noted as No Abnormality. Wound #16 status is Open. Original cause of wound was Trauma. The wound is located on the Left Hand - 3rd Digit. The wound is limited to skin breakdown. There is no tunneling or undermining noted. There is a medium amount of serous drainage noted. The wound margin is distinct with the outline attached to the wound base. There is medium (34-66%) red granulation within the wound bed. There is a medium (34-66%) amount of necrotic tissue within the wound bed including Eschar. The periwound skin appearance exhibited: Localized Edema, Maceration, Erythema. The periwound skin appearance did not exhibit: Callus, Crepitus, Excoriation, Fluctuance, Friable, Induration, Rash, Scarring, Dry/Scaly, Moist, Atrophie Blanche, Cyanosis, Ecchymosis, Hemosiderin Staining, Mottled, Pallor, Rubor. The surrounding wound skin color is noted with erythema which is  circumferential. Periwound temperature was noted as No Abnormality. Wound #17 status is Open. Original cause of wound was Trauma. The wound is located on the Left Hand - 4th Digit. The wound measures 0.8cm length x 0.9cm width x 0.1cm depth; 0.565cm^2 area and 0.057cm^3 volume. Wound #19 status is Open. Original cause of wound was Trauma. The wound is located on the Right Hand - 4th Digit. The wound measures 1.6cm length x 1.4cm width x 0.1cm depth; 1.759cm^2 area and 0.176cm^3 volume. The wound  is limited to skin breakdown. There is no tunneling or undermining noted. There is a none present amount of drainage noted. The wound margin is flat and intact. There is no granulation within the wound bed. There is a large (67-100%) amount of necrotic tissue within the wound bed including Eschar. The periwound skin appearance did not exhibit: Callus, Crepitus, Excoriation, Fluctuance, Friable, Induration, Localized Edema, Rash, Scarring, Dry/Scaly, Maceration, Moist, Atrophie Blanche, Cyanosis, Ecchymosis, Hemosiderin Staining, Mottled, Pallor, Rubor, Erythema. Wound #20 status is Open. Original cause of wound was Trauma. The wound is located on the Right Hand - 5th Digit. The wound measures 0.3cm length x 0.3cm width x 0.1cm depth; 0.071cm^2 area and 0.007cm^3 volume. The wound is limited to skin breakdown. There is no tunneling or undermining noted. There is a none present amount of drainage noted. The wound margin is flat and intact. There is no granulation within the wound bed. There is a large (67-100%) amount of necrotic tissue within the wound bed including Eschar. The periwound skin appearance did not exhibit: Callus, Crepitus, Excoriation, Fluctuance, Friable, Induration, Localized Edema, Rash, Scarring, Dry/Scaly, Maceration, Moist, Atrophie Blanche, Cyanosis, Ecchymosis, Hemosiderin Staining, Mottled, Pallor, Rubor, Erythema. Wound #21 status is Open. Original cause of wound was Surgical  Injury. 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 The wound measures 0.5cm length x 0.8cm width x 0.1cm depth; 0.314cm^2 area and 0.031cm^3 volume. The wound is limited to skin breakdown. There is no tunneling or undermining noted. There is a medium amount of serous drainage noted. The wound margin is flat and intact. There is medium (34-66%) red granulation within the wound bed. There is a medium (34-66%) amount of necrotic tissue within the wound bed including Eschar. The periwound skin appearance exhibited: Localized Edema, Erythema. The periwound skin appearance did not exhibit: Callus, Crepitus, Excoriation, Fluctuance, Friable, Induration, Rash, Scarring, Dry/Scaly, Maceration, Moist, Atrophie Blanche, Cyanosis, Ecchymosis, Hemosiderin Staining, Mottled, Pallor, Rubor. The surrounding wound skin color is noted with erythema which is circumferential. Wound #22 status is Open. Original cause of wound was Surgical Injury. The wound is located on the Left Hand - Palm. The wound measures 0.5cm length x 1cm width x 0.1cm depth; 0.393cm^2 area and 0.039cm^3 volume. The wound is limited to skin breakdown. There is a medium amount of serous drainage noted. The wound margin is flat and intact. There is large (67-100%) red granulation within the wound bed. There is no necrotic tissue within the wound bed. The periwound skin appearance did not exhibit: Callus, Fanton, Chattie R. ([id number]) Crepitus, Excoriation, Fluctuance, Friable, Induration, Localized Edema, Rash, Scarring, Dry/Scaly, Maceration, Moist, Atrophie Blanche, Cyanosis, Ecchymosis, Hemosiderin Staining, Mottled, Pallor, Rubor, Erythema. 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 They're agreeable and was done. We will pack this wound with 1/4 inch iodoform gauze and milk it as per the orthopedic doctor Coley's  instructions. the patient is also urged to follow-up with the orthopedic surgeon. Procedures Wound #22 Wound #22 is an Open Surgical Wound located on the Left Hand - Palm . There was a Skin/Subcutaneous Tissue/Muscle Debridement [id number]) debridement with total area of 0.5 sq cm performed by [name], MD. with the following instrument(s): Scissors to remove Viable and Non-Viable tissue/material including Eschar and Tendon after achieving pain control using Lidocaine 4% Topical Solution. A time out was conducted prior to the start of the procedure. A Minimum amount of bleeding was controlled with Pressure. The procedure was tolerated well with a pain level of 0 throughout and a pain level of 0 following the procedure. Post Debridement Measurements: 0.5cm length x 1cm width x 0.1cm depth; 0.039cm^3 volume. Post procedure Diagnosis Wound #22: Same as Pre-Procedure DELESIA, ROBERT R. ([id number]) Plan Wound Cleansing: Wound #12 Right Hand - 1st Digit: Clean wound with Normal Saline. Wound #14 Left Hand - 1st Digit: Clean wound with Normal Saline. Wound #15 Left Hand - 2nd Digit: Clean wound with Normal Saline. Wound #17 Left Hand - 4th Digit: Clean wound with Normal Saline. Wound #19 Right Hand - 4th Digit: Clean wound with Normal Saline. Wound #20 Right Hand - 5th Digit: Clean wound with Normal  Saline. 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 Wound #22 Left Hand - Palm: Clean wound with Normal Saline. Primary Wound Dressing: Wound #12 Right Hand - 1st Digit: Other: - betadine Wound #14 Left Hand - 1st Digit: Other: - betadine Wound #15 Left Hand - 2nd Digit: Other: - betadine Wound #17 Left Hand - 4th Digit: Other: - betadine Wound #19 Right Hand - 4th Digit: Other: - betadine Wound #20 Right Hand - 5th Digit: Other: - betadine Wound #22 Left Hand - Palm: Iodoform packing Gauze Wound #21 Left Amputation Site - Digit: Xeroform Secondary Dressing: Wound #21 Left Amputation Site - Digit: Gauze and Kerlix/Conform Wound #22 Left Hand - Palm: Gauze and Kerlix/Conform Dressing Change Frequency: Wound #12 Right Hand - 1st Digit: Change dressing every day. Wound #14 Left Hand - 1st Digit: Change dressing every day. Wound #15 Left Hand - 2nd Digit: Change dressing every day. Wound #17 Left Hand - 4th Digit: Change dressing every day. AMBERA, CONK R. ([id number]) Wound #19 Right Hand - 4th Digit: Change dressing every day. Wound #20 Right Hand - 5th Digit: Change dressing every day. Wound #21 Left Amputation Site - Digit: Other: - three times daily Wound #22 Left Hand - Palm: Other: - three times daily Follow-up Appointments: Wound #12 Right Hand - 1st Digit: Return Appointment in 1 week. Wound #14 Left Hand - 1st Digit: Return Appointment in 1 week. Wound #15 Left Hand - 2nd Digit: Return Appointment in 1 week. Wound #17 Left Hand - 4th Digit: Return Appointment in 1 week. Wound #19 Right Hand - 4th Digit: Return Appointment in 1 week. Wound #20 Right Hand - 5th Digit: Return Appointment in 1 week. Wound #21 Left Amputation Site - Digit: Return Appointment in 1 week. Wound #22 Left Hand - Palm: Return Appointment in 1 week. I have thoroughly discussed with the patient and her husband the need to cut of this tendon which  is protruding through the palmar aspect of the wound left open. They're agreeable and was done. We will pack this wound with 1/4 inch iodoform gauze and milk it as per the orthopedic doctor Coley's instructions. the patient is also urged to follow-up with the orthopedic surgeon.  Electronic Signature(s) Signed: 07/11/2015 9:04:01 AM By: [name] MD, FACS Entered By: [name] on 07/11/2015 09:04:01 HARIKLIA, CHAMBLESS ([id number]) -------------------------------------------------------------------------------- SuperBill Details Patient Name: [name] Date of Service: 07/11/2015 Medical Record Number: [id number] Patient Account Number: [id number] Date of Birth/Sex: [birth year] (49 y.o. Female) Treating RN: Montey Hora Primary Care Physician: Arnette Norris Other Clinician: Referring Physician: Arnette Norris Treating Physician/Extender: Frann Rider in Treatment: 11 Diagnosis Coding ICD-10 Codes Code Description E11.40 Type 2 diabetes mellitus with diabetic neuropathy, unspecified Z99.2 Dependence on renal dialysis L89.310 Pressure ulcer of right buttock, unstageable S61.300A Unspecified open wound of right index finger with damage to nail, initial encounter S61.301A Unspecified open wound of left index finger with damage to nail, initial encounter S61.307A Unspecified open wound of left little finger with damage to nail, initial encounter S61.303A Unspecified open wound of left middle finger with damage to nail, initial encounter Facility Procedures CPT4: Description Modifier Quantity Code [id number] - DEB MUSC/FASCIA 20 SQ CM/< 1 ICD-10 Description Diagnosis E11.40 Type 2 diabetes mellitus with diabetic neuropathy, unspecified S61.303A Unspecified open wound of left middle finger with damage to  nail, initial encounter S61.301A Unspecified open wound of left index finger with damage to nail, initial encounter S61.300A Unspecified open wound of right index  finger with damage to nail, initial encounter Physician Procedures CPT4: Description Modifier Quantity Code [id number] - WC PHYS DEBR MUSCLE/FASCIA 20 SQ CM 1 ICD-10 Description Diagnosis E11.40 Type 2 diabetes mellitus with diabetic neuropathy, unspecified S61.303A Unspecified open wound of left middle finger with  damage to nail, initial encounter S61.301A Unspecified open wound of left index finger with damage to nail, initial encounter PATTE, KARPEL ([id number]) Electronic Signature(s) Signed: 07/11/2015 9:04:23 AM By: [name] MD, FACS Entered By: [name] on 07/11/2015 09:04:23

## 2015-07-12 NOTE — Progress Notes (Signed)
Hemosiderin Staining: No Localized Edema: Yes Mottled: No Rash: No Pallor: No Scarring: No Rubor: No Moisture Temperature / Pain No  Abnormalities Noted: No Temperature: No Abnormality Dry / Scaly: No Maceration: Yes Moist: No Wound Preparation Ulcer Cleansing: Rinsed/Irrigated with Saline Topical Anesthetic Applied: None Electronic Signature(s) Signed: 07/11/2015 5:07:12 PM By: Montey Hora Entered By: Montey Hora on 07/11/2015 08:29:44 [name] ([id number]) -------------------------------------------------------------------------------- Wound Assessment Details Patient Name: [name] Date of Service: 07/11/2015 8:00 AM Medical Record Number: [id number] Patient Account Number: [id number] Date of Birth/Sex: 04/09/1966 (49 y.o. Female) Treating RN: Montey Hora Primary Care Physician: Arnette Norris Other Clinician: Referring Physician: Arnette Norris Treating Physician/Extender: Frann Rider in Treatment: 11 Wound Status Wound Number: 17 Primary Etiology: Trauma, Other Wound Location: Left Hand - 4th Digit Wound Status: Open Wounding Event: Trauma Date Acquired: 12/04/2014 Weeks Of Treatment: 11 Clustered Wound: No Photos Photo Uploaded By: Montey Hora on 07/11/2015 12:58:07 Wound Measurements Length: (cm) 0.8 Width: (cm) 0.9 Depth: (cm) 0.1 Area: (cm) 0.565 Volume: (cm) 0.057 % Reduction in Area: 10% % Reduction in Volume: 9.5% Wound Description Classification: Partial Thickness Periwound Skin Texture Texture Color No Abnormalities Noted: No No Abnormalities Noted: No Moisture No Abnormalities Noted: No Treatment Notes Wound #17 (Left Hand - 4th Digit) 1. Cleansed with: HAWRAA, KOBZA ([id number]) Clean wound with Normal Saline Notes betadine paint Electronic Signature(s) Signed: 07/11/2015 5:07:12 PM By: Montey Hora Entered By: Montey Hora on 07/11/2015 08:32:11 JHAYLA, MERLINI ([id number]) -------------------------------------------------------------------------------- Wound Assessment Details Patient Name: [name] Date  of Service: 07/11/2015 8:00 AM Medical Record Number: [id number] Patient Account Number: [id number] Date of Birth/Sex: 1966/04/05 (49 y.o. Female) Treating RN: Montey Hora Primary Care Physician: Arnette Norris Other Clinician: Referring Physician: Arnette Norris Treating Physician/Extender: Frann Rider in Treatment: 11 Wound Status Wound Number: 19 Primary Trauma, Other Etiology: Wound Location: Right Hand - 4th Digit Wound Open Wounding Event: Trauma Status: Date Acquired: 06/27/2015 Comorbid Cataracts, Anemia, Type II Diabetes, Weeks Of Treatment: 2 History: End Stage Renal Disease, History of Clustered Wound: No Burn, Neuropathy Photos Photo Uploaded By: Montey Hora on 07/11/2015 13:02:15 Wound Measurements Length: (cm) 1.6 Width: (cm) 1.4 Depth: (cm) 0.1 Area: (cm) 1.759 Volume: (cm) 0.176 % Reduction in Area: -180.1% % Reduction in Volume: -179.4% Epithelialization: None Tunneling: No Undermining: No Wound Description Classification: Partial Thickness Wound Margin: Flat and Intact Exudate Amount: None Present Foul Odor After Cleansing: No Wound Bed Granulation Amount: None Present (0%) Exposed Structure Necrotic Amount: Large (67-100%) Fascia Exposed: No Necrotic Quality: Eschar Fat Layer Exposed: No Tendon Exposed: No Muscle Exposed: No Joint Exposed: No Felten, Stephania R. ([id number]) Bone Exposed: No Limited to Skin Breakdown Periwound Skin Texture Texture Color No Abnormalities Noted: No No Abnormalities Noted: No Callus: No Atrophie Blanche: No Crepitus: No Cyanosis: No Excoriation: No Ecchymosis: No Fluctuance: No Erythema: No Friable: No Hemosiderin Staining: No Induration: No Mottled: No Localized Edema: No Pallor: No Rash: No Rubor: No Scarring: No Moisture No Abnormalities Noted: No Dry / Scaly: No Maceration: No Moist: No Wound Preparation Ulcer Cleansing: Rinsed/Irrigated with Saline Topical Anesthetic  Applied: None Treatment Notes Wound #19 (Right Hand - 4th Digit) 1. Cleansed with: Clean wound with Normal Saline Notes betadine paint Electronic Signature(s) Signed: 07/11/2015 5:07:12 PM By: Montey Hora Entered By: Montey Hora on 07/11/2015 08:26:38 [name] ([id number]) -------------------------------------------------------------------------------- Wound Assessment Details Patient Name: [name] Date of Service: 07/11/2015 8:00 AM Medical Record Number: [id number] Patient Account Number: [id number] Date of  No Wound Preparation Ulcer Cleansing: Rinsed/Irrigated with Saline Topical Anesthetic Applied: None Treatment Notes Wound #14 (Left Hand - 1st Digit) 1. Cleansed with: Clean wound with Normal Saline Notes betadine paint Electronic Signature(s) Signed: 07/11/2015 5:07:12 PM By: Montey Hora Entered By: Montey Hora on 07/11/2015 08:32:38 [name] ([id number]) -------------------------------------------------------------------------------- Wound Assessment Details Patient Name: [name] Date of Service: 07/11/2015 8:00 AM Medical Record Number: [id number] Patient Account Number: [id number] Date of Birth/Sex: May 24, 1966 (49 y.o. Female) Treating RN: Montey Hora Primary Care Physician: Arnette Norris Other Clinician: Referring Physician: Arnette Norris Treating Physician/Extender: Frann Rider in Treatment: 11 Wound Status Wound Number: 15 Primary Trauma, Other Etiology: Wound Location: Left Hand - 2nd Digit Wound Open Wounding Event: Trauma Status: Date Acquired: 12/04/2014 Comorbid Cataracts, Anemia, Type II Diabetes, Weeks Of Treatment: 11 History: End Stage Renal Disease, History of Clustered Wound: No Burn, Neuropathy Photos Photo Uploaded By: Montey Hora on 07/11/2015 12:54:38 Wound Measurements Length: (cm) 0.8 Width: (cm) 1 Depth: (cm) 0.1 Area: (cm) 0.628 Volume: (cm) 0.063 % Reduction in Area: 74.4% % Reduction in Volume: 74.3% Epithelialization: None Wound Description Classification: Partial Thickness Wound Margin: Distinct, outline attached Exudate Amount: None Present Foul Odor After Cleansing: No Wound Bed Granulation Amount: None Present (0%) Exposed Structure Necrotic Amount: Large (67-100%) Fascia Exposed: No Necrotic Quality: Eschar Fat Layer Exposed: No Tendon Exposed: No Muscle Exposed: No Joint Exposed: No Awtrey, Ellary R. ([id number]) Bone Exposed: No Limited to Skin Breakdown Periwound Skin Texture Texture Color No Abnormalities Noted: No No Abnormalities Noted: No Callus: No Atrophie Blanche: No Crepitus: No Cyanosis: No Excoriation: No Ecchymosis: No Fluctuance: No Erythema: No Friable: No Hemosiderin Staining: No Induration: No Mottled: No Localized Edema: No Pallor: No Rash: No Rubor: No Scarring: No Temperature / Pain Moisture Temperature: No Abnormality No Abnormalities Noted: No Dry / Scaly: Yes Maceration: No Moist: No Wound Preparation Ulcer Cleansing: Rinsed/Irrigated with Saline Topical Anesthetic Applied: None Treatment Notes Wound #15 (Left Hand - 2nd Digit) 1. Cleansed with: Clean wound with Normal Saline Notes betadine paint Electronic Signature(s) Signed: 07/11/2015 5:07:12 PM By: Montey Hora Entered By: Montey Hora on 07/11/2015 08:32:54 [name]  ([id number]) -------------------------------------------------------------------------------- Wound Assessment Details Patient Name: [name] Date of Service: 07/11/2015 8:00 AM Medical Record Number: [id number] Patient Account Number: [id number] Date of Birth/Sex: 07/13/1966 (49 y.o. Female) Treating RN: Montey Hora Primary Care Physician: Arnette Norris Other Clinician: Referring Physician: Arnette Norris Treating Physician/Extender: Frann Rider in Treatment: 11 Wound Status Wound Number: 16 Primary Trauma, Other Etiology: Wound Location: Left Hand - 3rd Digit Wound Open Wounding Event: Trauma Status: Date Acquired: 12/04/2014 Comorbid Cataracts, Anemia, Type II Diabetes, Weeks Of Treatment: 11 History: End Stage Renal Disease, History of Clustered Wound: No Burn, Neuropathy Wound Measurements % Reduction in Area: % Reduction in Volume: Epithelialization: None Tunneling: No Undermining: No Wound Description Full Thickness Without Exposed Classification: Support Structures Wound Margin: Distinct, outline attached Exudate Medium Amount: Exudate Type: Serous Exudate Color: amber Foul Odor After Cleansing: No Wound Bed Granulation Amount: Medium (34-66%) Exposed Structure Granulation Quality: Red Fascia Exposed: No Necrotic Amount: Medium (34-66%) Fat Layer Exposed: No Necrotic Quality: Eschar Tendon Exposed: No Muscle Exposed: No Joint Exposed: No Bone Exposed: No Limited to Skin Breakdown Periwound Skin Texture Texture Color No Abnormalities Noted: No No Abnormalities Noted: No Callus: No Atrophie Blanche: No Marcelino, Enis R. ([id number]) Crepitus: No Cyanosis: No Excoriation: No Ecchymosis: No Fluctuance: No Erythema: Yes Friable: No Erythema Location: Circumferential Induration: No  Birth/Sex: 1966/08/05 (49 y.o. Female) Treating RN: Montey Hora Primary Care Physician: Arnette Norris Other Clinician: Referring Physician: Arnette Norris Treating Physician/Extender: Frann Rider in Treatment: 11 Wound Status Wound Number: 20 Primary Trauma, Other Etiology: Wound Location: Right Hand - 5th Digit Wound Open Wounding Event: Trauma Status: Date Acquired: 06/27/2015 Comorbid Cataracts, Anemia, Type II Diabetes, Weeks Of Treatment: 2 History: End Stage Renal Disease, History of Clustered Wound: No Burn, Neuropathy Photos Photo Uploaded By: Montey Hora on 07/11/2015 12:52:42 Wound Measurements Length: (cm) 0.3 Width: (cm) 0.3 Depth: (cm) 0.1 Area: (cm) 0.071 Volume: (cm) 0.007 % Reduction in Area: 43.7% % Reduction in Volume: 46.2% Epithelialization: None Tunneling: No Undermining: No Wound Description Classification: Partial Thickness Wound Margin: Flat and Intact Exudate Amount: None Present Foul Odor After Cleansing: No Wound Bed Granulation Amount: None Present (0%) Exposed Structure Necrotic Amount: Large (67-100%) Fascia Exposed: No Necrotic Quality: Eschar Fat Layer Exposed: No Tendon Exposed: No Muscle Exposed: No Joint Exposed: No Bartok, Tiarah R. ([id number]) Bone Exposed: No Limited to Skin Breakdown Periwound Skin Texture Texture Color No Abnormalities Noted: No No Abnormalities  Noted: No Callus: No Atrophie Blanche: No Crepitus: No Cyanosis: No Excoriation: No Ecchymosis: No Fluctuance: No Erythema: No Friable: No Hemosiderin Staining: No Induration: No Mottled: No Localized Edema: No Pallor: No Rash: No Rubor: No Scarring: No Moisture No Abnormalities Noted: No Dry / Scaly: No Maceration: No Moist: No Wound Preparation Ulcer Cleansing: Rinsed/Irrigated with Saline Topical Anesthetic Applied: None Treatment Notes Wound #20 (Right Hand - 5th Digit) 1. Cleansed with: Clean wound with Normal Saline Notes betadine paint Electronic Signature(s) Signed: 07/11/2015 5:07:12 PM By: Montey Hora Entered By: Montey Hora on 07/11/2015 08:26:56 [name] ([id number]) -------------------------------------------------------------------------------- Wound Assessment Details Patient Name: [name] Date of Service: 07/11/2015 8:00 AM Medical Record Number: [id number] Patient Account Number: [id number] Date of Birth/Sex: [birth year] (49 y.o. 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 Wound Open Wounding Event: Surgical Injury Status: Date Acquired: 06/27/2015 Comorbid Cataracts, Anemia, Type II Diabetes, Weeks Of Treatment: 0 History: End Stage Renal Disease, History of Clustered Wound: No Burn, Neuropathy Photos Photo Uploaded By: Montey Hora on 07/11/2015 12:52:43 Wound Measurements Length: (cm) 0.5 Width: (cm) 0.8 Depth: (cm) 0.1 Area: (cm) 0.314 Volume: (cm) 0.031 % Reduction in Area: 0% % Reduction in Volume: 0% Epithelialization: None Tunneling: No Undermining: No Wound Description Full Thickness Without Exposed Classification: Support  Structures Wound Margin: Flat and Intact Exudate Medium Amount: Exudate Type: Serous Exudate Color: amber Foul Odor After Cleansing: No Wound Bed Granulation Amount: Medium (34-66%) Exposed Structure Granulation Quality: Red Fascia Exposed: No Crounse, Hoa R. ([id number]) Necrotic Amount: Medium (34-66%) Fat Layer Exposed: No Necrotic Quality: Eschar Tendon Exposed: No Muscle Exposed: No Joint Exposed: No Bone Exposed: No Limited to Skin Breakdown Periwound Skin Texture Texture Color No Abnormalities Noted: No No Abnormalities Noted: No Callus: No Atrophie Blanche: No Crepitus: No Cyanosis: No Excoriation: No Ecchymosis: No Fluctuance: No Erythema: Yes Friable: No Erythema Location: Circumferential Induration: No Hemosiderin Staining: No Localized Edema: Yes Mottled: No Rash: No Pallor: No Scarring: No Rubor: No Moisture No Abnormalities Noted: No Dry / Scaly: No Maceration: No Moist: No Wound Preparation Ulcer Cleansing: Rinsed/Irrigated with Saline Topical Anesthetic Applied: None Treatment Notes Wound #21 (Left Amputation Site - Digit) 1. Cleansed  [id number]) -------------------------------------------------------------------------------- Wound Assessment Details Patient Name: KELANY, HANLAN Date of Service: 07/11/2015 8:00 AM Medical Record Number: [id number] Patient Account Number: [id number] Date of Birth/Sex: 12-08-1965 (49 y.o. Female) Treating RN: Montey Hora Primary Care Physician: Arnette Norris Other Clinician: Referring Physician: Arnette Norris Treating Physician/Extender: Frann Rider in Treatment: 11 Wound Status Wound Number: 12 Primary Trauma, Other Etiology: Wound Location: Right Hand - 1st Digit Wound Open Wounding Event: Trauma Status: Date Acquired: 12/04/2014 Comorbid Cataracts, Anemia, Type II Diabetes, Weeks Of Treatment: 11 History: End Stage Renal Disease, History of Clustered Wound: No Burn, Neuropathy Photos Photo Uploaded By: Montey Hora on 07/11/2015 12:56:01 Wound Measurements Length: (cm) 1 Width: (cm) 1.4 Depth: (cm) 0.1 Area: (cm) 1.1 Volume: (cm) 0.11 % Reduction in Area: 10.2% % Reduction in Volume: 10.6% Epithelialization: None Tunneling: No Undermining: No Wound Description Classification: Partial Thickness Wound Margin: Flat and Intact Exudate Amount: None Present Foul Odor After Cleansing: No Wound Bed Granulation Amount: Medium (34-66%) Exposed Structure Granulation Quality: Red Fascia Exposed: No Necrotic Amount: Medium (34-66%) Fat Layer Exposed: No Necrotic Quality: Eschar Tendon  Exposed: No Muscle Exposed: No Joint Exposed: No Economos, Citlali R. ([id number]) Bone Exposed: No Limited to Skin Breakdown Periwound Skin Texture Texture Color No Abnormalities Noted: No No Abnormalities Noted: No Callus: No Atrophie Blanche: No Crepitus: No Cyanosis: No Excoriation: No Ecchymosis: No Fluctuance: No Erythema: No Friable: No Hemosiderin Staining: No Induration: No Mottled: No Localized Edema: No Pallor: No Rash: No Rubor: No Scarring: No Temperature / Pain Moisture Temperature: No Abnormality No Abnormalities Noted: No Dry / Scaly: Yes Maceration: No Moist: No Wound Preparation Ulcer Cleansing: Rinsed/Irrigated with Saline Topical Anesthetic Applied: None Treatment Notes Wound #12 (Right Hand - 1st Digit) 1. Cleansed with: Clean wound with Normal Saline Notes betadine paint Electronic Signature(s) Signed: 07/11/2015 5:07:12 PM By: Montey Hora Entered By: Montey Hora on 07/11/2015 08:25:42 [name] ([id number]) -------------------------------------------------------------------------------- Wound Assessment Details Patient Name: [name] Date of Service: 07/11/2015 8:00 AM Medical Record Number: [id number] Patient Account Number: [id number] Date of Birth/Sex: 05/26/1966 (49 y.o. Female) Treating RN: Montey Hora Primary Care Physician: Arnette Norris Other Clinician: Referring Physician: Arnette Norris Treating Physician/Extender: Frann Rider in Treatment: 11 Wound Status Wound Number: 14 Primary Trauma, Other Etiology: Wound Location: Left Hand - 1st Digit Wound Open Wounding Event: Trauma Status: Date Acquired: 12/04/2014 Comorbid Cataracts, Anemia, Type II Diabetes, Weeks Of Treatment: 11 History: End Stage Renal Disease, History of Clustered Wound: No Burn, Neuropathy Photos Photo Uploaded By: Montey Hora on 07/11/2015 12:54:36 Wound Measurements Length: (cm) 1.5 Width: (cm) 1 Depth: (cm)  0.1 Area: (cm) 1.178 Volume: (cm) 0.118 % Reduction in Area: -177.8% % Reduction in Volume: -181% Epithelialization: None Tunneling: No Undermining: No Wound Description Classification: Partial Thickness Wound Margin: Flat and Intact Exudate Amount: None Present Foul Odor After Cleansing: No Wound Bed Granulation Amount: Small (1-33%) Exposed Structure Necrotic Amount: Large (67-100%) Fascia Exposed: No Necrotic Quality: Eschar Fat Layer Exposed: No Tendon Exposed: No Muscle Exposed: No Joint Exposed: No Faulds, Xiana R. ([id number]) Bone Exposed: No Limited to Skin Breakdown Periwound Skin Texture Texture Color No Abnormalities Noted: No No Abnormalities Noted: No Callus: No Atrophie Blanche: No Crepitus: No Cyanosis: No Excoriation: No Ecchymosis: No Fluctuance: No Erythema: No Friable: No Hemosiderin Staining: No Induration: No Mottled: No Localized Edema: No Pallor: No Rash: No Rubor: No Scarring: No Temperature / Pain Moisture Temperature: No Abnormality No Abnormalities Noted: No Dry / Scaly: Yes Maceration: No Moist:  [id number]) -------------------------------------------------------------------------------- Wound Assessment Details Patient Name: KELANY, HANLAN Date of Service: 07/11/2015 8:00 AM Medical Record Number: [id number] Patient Account Number: [id number] Date of Birth/Sex: 12-08-1965 (49 y.o. Female) Treating RN: Montey Hora Primary Care Physician: Arnette Norris Other Clinician: Referring Physician: Arnette Norris Treating Physician/Extender: Frann Rider in Treatment: 11 Wound Status Wound Number: 12 Primary Trauma, Other Etiology: Wound Location: Right Hand - 1st Digit Wound Open Wounding Event: Trauma Status: Date Acquired: 12/04/2014 Comorbid Cataracts, Anemia, Type II Diabetes, Weeks Of Treatment: 11 History: End Stage Renal Disease, History of Clustered Wound: No Burn, Neuropathy Photos Photo Uploaded By: Montey Hora on 07/11/2015 12:56:01 Wound Measurements Length: (cm) 1 Width: (cm) 1.4 Depth: (cm) 0.1 Area: (cm) 1.1 Volume: (cm) 0.11 % Reduction in Area: 10.2% % Reduction in Volume: 10.6% Epithelialization: None Tunneling: No Undermining: No Wound Description Classification: Partial Thickness Wound Margin: Flat and Intact Exudate Amount: None Present Foul Odor After Cleansing: No Wound Bed Granulation Amount: Medium (34-66%) Exposed Structure Granulation Quality: Red Fascia Exposed: No Necrotic Amount: Medium (34-66%) Fat Layer Exposed: No Necrotic Quality: Eschar Tendon  Exposed: No Muscle Exposed: No Joint Exposed: No Economos, Citlali R. ([id number]) Bone Exposed: No Limited to Skin Breakdown Periwound Skin Texture Texture Color No Abnormalities Noted: No No Abnormalities Noted: No Callus: No Atrophie Blanche: No Crepitus: No Cyanosis: No Excoriation: No Ecchymosis: No Fluctuance: No Erythema: No Friable: No Hemosiderin Staining: No Induration: No Mottled: No Localized Edema: No Pallor: No Rash: No Rubor: No Scarring: No Temperature / Pain Moisture Temperature: No Abnormality No Abnormalities Noted: No Dry / Scaly: Yes Maceration: No Moist: No Wound Preparation Ulcer Cleansing: Rinsed/Irrigated with Saline Topical Anesthetic Applied: None Treatment Notes Wound #12 (Right Hand - 1st Digit) 1. Cleansed with: Clean wound with Normal Saline Notes betadine paint Electronic Signature(s) Signed: 07/11/2015 5:07:12 PM By: Montey Hora Entered By: Montey Hora on 07/11/2015 08:25:42 [name] ([id number]) -------------------------------------------------------------------------------- Wound Assessment Details Patient Name: [name] Date of Service: 07/11/2015 8:00 AM Medical Record Number: [id number] Patient Account Number: [id number] Date of Birth/Sex: 05/26/1966 (49 y.o. Female) Treating RN: Montey Hora Primary Care Physician: Arnette Norris Other Clinician: Referring Physician: Arnette Norris Treating Physician/Extender: Frann Rider in Treatment: 11 Wound Status Wound Number: 14 Primary Trauma, Other Etiology: Wound Location: Left Hand - 1st Digit Wound Open Wounding Event: Trauma Status: Date Acquired: 12/04/2014 Comorbid Cataracts, Anemia, Type II Diabetes, Weeks Of Treatment: 11 History: End Stage Renal Disease, History of Clustered Wound: No Burn, Neuropathy Photos Photo Uploaded By: Montey Hora on 07/11/2015 12:54:36 Wound Measurements Length: (cm) 1.5 Width: (cm) 1 Depth: (cm)  0.1 Area: (cm) 1.178 Volume: (cm) 0.118 % Reduction in Area: -177.8% % Reduction in Volume: -181% Epithelialization: None Tunneling: No Undermining: No Wound Description Classification: Partial Thickness Wound Margin: Flat and Intact Exudate Amount: None Present Foul Odor After Cleansing: No Wound Bed Granulation Amount: Small (1-33%) Exposed Structure Necrotic Amount: Large (67-100%) Fascia Exposed: No Necrotic Quality: Eschar Fat Layer Exposed: No Tendon Exposed: No Muscle Exposed: No Joint Exposed: No Faulds, Xiana R. ([id number]) Bone Exposed: No Limited to Skin Breakdown Periwound Skin Texture Texture Color No Abnormalities Noted: No No Abnormalities Noted: No Callus: No Atrophie Blanche: No Crepitus: No Cyanosis: No Excoriation: No Ecchymosis: No Fluctuance: No Erythema: No Friable: No Hemosiderin Staining: No Induration: No Mottled: No Localized Edema: No Pallor: No Rash: No Rubor: No Scarring: No Temperature / Pain Moisture Temperature: No Abnormality No Abnormalities Noted: No Dry / Scaly: Yes Maceration: No Moist:  Birth/Sex: 1966/08/05 (49 y.o. Female) Treating RN: Montey Hora Primary Care Physician: Arnette Norris Other Clinician: Referring Physician: Arnette Norris Treating Physician/Extender: Frann Rider in Treatment: 11 Wound Status Wound Number: 20 Primary Trauma, Other Etiology: Wound Location: Right Hand - 5th Digit Wound Open Wounding Event: Trauma Status: Date Acquired: 06/27/2015 Comorbid Cataracts, Anemia, Type II Diabetes, Weeks Of Treatment: 2 History: End Stage Renal Disease, History of Clustered Wound: No Burn, Neuropathy Photos Photo Uploaded By: Montey Hora on 07/11/2015 12:52:42 Wound Measurements Length: (cm) 0.3 Width: (cm) 0.3 Depth: (cm) 0.1 Area: (cm) 0.071 Volume: (cm) 0.007 % Reduction in Area: 43.7% % Reduction in Volume: 46.2% Epithelialization: None Tunneling: No Undermining: No Wound Description Classification: Partial Thickness Wound Margin: Flat and Intact Exudate Amount: None Present Foul Odor After Cleansing: No Wound Bed Granulation Amount: None Present (0%) Exposed Structure Necrotic Amount: Large (67-100%) Fascia Exposed: No Necrotic Quality: Eschar Fat Layer Exposed: No Tendon Exposed: No Muscle Exposed: No Joint Exposed: No Bartok, Tiarah R. ([id number]) Bone Exposed: No Limited to Skin Breakdown Periwound Skin Texture Texture Color No Abnormalities Noted: No No Abnormalities  Noted: No Callus: No Atrophie Blanche: No Crepitus: No Cyanosis: No Excoriation: No Ecchymosis: No Fluctuance: No Erythema: No Friable: No Hemosiderin Staining: No Induration: No Mottled: No Localized Edema: No Pallor: No Rash: No Rubor: No Scarring: No Moisture No Abnormalities Noted: No Dry / Scaly: No Maceration: No Moist: No Wound Preparation Ulcer Cleansing: Rinsed/Irrigated with Saline Topical Anesthetic Applied: None Treatment Notes Wound #20 (Right Hand - 5th Digit) 1. Cleansed with: Clean wound with Normal Saline Notes betadine paint Electronic Signature(s) Signed: 07/11/2015 5:07:12 PM By: Montey Hora Entered By: Montey Hora on 07/11/2015 08:26:56 [name] ([id number]) -------------------------------------------------------------------------------- Wound Assessment Details Patient Name: [name] Date of Service: 07/11/2015 8:00 AM Medical Record Number: [id number] Patient Account Number: [id number] Date of Birth/Sex: [birth year] (49 y.o. 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 Wound Open Wounding Event: Surgical Injury Status: Date Acquired: 06/27/2015 Comorbid Cataracts, Anemia, Type II Diabetes, Weeks Of Treatment: 0 History: End Stage Renal Disease, History of Clustered Wound: No Burn, Neuropathy Photos Photo Uploaded By: Montey Hora on 07/11/2015 12:52:43 Wound Measurements Length: (cm) 0.5 Width: (cm) 0.8 Depth: (cm) 0.1 Area: (cm) 0.314 Volume: (cm) 0.031 % Reduction in Area: 0% % Reduction in Volume: 0% Epithelialization: None Tunneling: No Undermining: No Wound Description Full Thickness Without Exposed Classification: Support  Structures Wound Margin: Flat and Intact Exudate Medium Amount: Exudate Type: Serous Exudate Color: amber Foul Odor After Cleansing: No Wound Bed Granulation Amount: Medium (34-66%) Exposed Structure Granulation Quality: Red Fascia Exposed: No Crounse, Hoa R. ([id number]) Necrotic Amount: Medium (34-66%) Fat Layer Exposed: No Necrotic Quality: Eschar Tendon Exposed: No Muscle Exposed: No Joint Exposed: No Bone Exposed: No Limited to Skin Breakdown Periwound Skin Texture Texture Color No Abnormalities Noted: No No Abnormalities Noted: No Callus: No Atrophie Blanche: No Crepitus: No Cyanosis: No Excoriation: No Ecchymosis: No Fluctuance: No Erythema: Yes Friable: No Erythema Location: Circumferential Induration: No Hemosiderin Staining: No Localized Edema: Yes Mottled: No Rash: No Pallor: No Scarring: No Rubor: No Moisture No Abnormalities Noted: No Dry / Scaly: No Maceration: No Moist: No Wound Preparation Ulcer Cleansing: Rinsed/Irrigated with Saline Topical Anesthetic Applied: None Treatment Notes Wound #21 (Left Amputation Site - Digit) 1. Cleansed  [id number]) -------------------------------------------------------------------------------- Wound Assessment Details Patient Name: KELANY, HANLAN Date of Service: 07/11/2015 8:00 AM Medical Record Number: [id number] Patient Account Number: [id number] Date of Birth/Sex: 12-08-1965 (49 y.o. Female) Treating RN: Montey Hora Primary Care Physician: Arnette Norris Other Clinician: Referring Physician: Arnette Norris Treating Physician/Extender: Frann Rider in Treatment: 11 Wound Status Wound Number: 12 Primary Trauma, Other Etiology: Wound Location: Right Hand - 1st Digit Wound Open Wounding Event: Trauma Status: Date Acquired: 12/04/2014 Comorbid Cataracts, Anemia, Type II Diabetes, Weeks Of Treatment: 11 History: End Stage Renal Disease, History of Clustered Wound: No Burn, Neuropathy Photos Photo Uploaded By: Montey Hora on 07/11/2015 12:56:01 Wound Measurements Length: (cm) 1 Width: (cm) 1.4 Depth: (cm) 0.1 Area: (cm) 1.1 Volume: (cm) 0.11 % Reduction in Area: 10.2% % Reduction in Volume: 10.6% Epithelialization: None Tunneling: No Undermining: No Wound Description Classification: Partial Thickness Wound Margin: Flat and Intact Exudate Amount: None Present Foul Odor After Cleansing: No Wound Bed Granulation Amount: Medium (34-66%) Exposed Structure Granulation Quality: Red Fascia Exposed: No Necrotic Amount: Medium (34-66%) Fat Layer Exposed: No Necrotic Quality: Eschar Tendon  Exposed: No Muscle Exposed: No Joint Exposed: No Economos, Citlali R. ([id number]) Bone Exposed: No Limited to Skin Breakdown Periwound Skin Texture Texture Color No Abnormalities Noted: No No Abnormalities Noted: No Callus: No Atrophie Blanche: No Crepitus: No Cyanosis: No Excoriation: No Ecchymosis: No Fluctuance: No Erythema: No Friable: No Hemosiderin Staining: No Induration: No Mottled: No Localized Edema: No Pallor: No Rash: No Rubor: No Scarring: No Temperature / Pain Moisture Temperature: No Abnormality No Abnormalities Noted: No Dry / Scaly: Yes Maceration: No Moist: No Wound Preparation Ulcer Cleansing: Rinsed/Irrigated with Saline Topical Anesthetic Applied: None Treatment Notes Wound #12 (Right Hand - 1st Digit) 1. Cleansed with: Clean wound with Normal Saline Notes betadine paint Electronic Signature(s) Signed: 07/11/2015 5:07:12 PM By: Montey Hora Entered By: Montey Hora on 07/11/2015 08:25:42 [name] ([id number]) -------------------------------------------------------------------------------- Wound Assessment Details Patient Name: [name] Date of Service: 07/11/2015 8:00 AM Medical Record Number: [id number] Patient Account Number: [id number] Date of Birth/Sex: 05/26/1966 (49 y.o. Female) Treating RN: Montey Hora Primary Care Physician: Arnette Norris Other Clinician: Referring Physician: Arnette Norris Treating Physician/Extender: Frann Rider in Treatment: 11 Wound Status Wound Number: 14 Primary Trauma, Other Etiology: Wound Location: Left Hand - 1st Digit Wound Open Wounding Event: Trauma Status: Date Acquired: 12/04/2014 Comorbid Cataracts, Anemia, Type II Diabetes, Weeks Of Treatment: 11 History: End Stage Renal Disease, History of Clustered Wound: No Burn, Neuropathy Photos Photo Uploaded By: Montey Hora on 07/11/2015 12:54:36 Wound Measurements Length: (cm) 1.5 Width: (cm) 1 Depth: (cm)  0.1 Area: (cm) 1.178 Volume: (cm) 0.118 % Reduction in Area: -177.8% % Reduction in Volume: -181% Epithelialization: None Tunneling: No Undermining: No Wound Description Classification: Partial Thickness Wound Margin: Flat and Intact Exudate Amount: None Present Foul Odor After Cleansing: No Wound Bed Granulation Amount: Small (1-33%) Exposed Structure Necrotic Amount: Large (67-100%) Fascia Exposed: No Necrotic Quality: Eschar Fat Layer Exposed: No Tendon Exposed: No Muscle Exposed: No Joint Exposed: No Faulds, Xiana R. ([id number]) Bone Exposed: No Limited to Skin Breakdown Periwound Skin Texture Texture Color No Abnormalities Noted: No No Abnormalities Noted: No Callus: No Atrophie Blanche: No Crepitus: No Cyanosis: No Excoriation: No Ecchymosis: No Fluctuance: No Erythema: No Friable: No Hemosiderin Staining: No Induration: No Mottled: No Localized Edema: No Pallor: No Rash: No Rubor: No Scarring: No Temperature / Pain Moisture Temperature: No Abnormality No Abnormalities Noted: No Dry / Scaly: Yes Maceration: No Moist:  with: Clean wound with Normal Saline 2. Anesthetic Topical Lidocaine 4% cream to wound bed prior to debridement 4. Dressing Applied: Petroleum gauze 5. Secondary Dressing Applied Gauze and Kerlix/Conform Electronic Signature(s) Signed: 07/11/2015 5:07:12 PM By: Montey Hora Entered By: Montey Hora on 07/11/2015 08:33:51 JASHAYLA, HOSE ([id number]) -------------------------------------------------------------------------------- Wound Assessment Details Patient Name: [name] Date of Service: 07/11/2015 8:00 AM Medical Record Number: [id number] Patient Account Number: [id number] Date of Birth/Sex: 01/01/1966 (49 y.o. Female) Treating RN: Montey Hora Primary Care Physician: Arnette Norris Other Clinician: Referring Physician: Arnette Norris Treating Physician/Extender: Frann Rider in Treatment: 11 Wound Status Wound Number: 22 Primary Open Surgical Wound Etiology: Wound Location: Left Hand - Palm Wound Open Wounding Event: Surgical Injury Status: Date Acquired: 06/27/2015 Comorbid Cataracts, Anemia, Type II Diabetes, Weeks Of Treatment: 0 History: End Stage Renal Disease, History of Clustered Wound: No Burn, Neuropathy Photos Photo Uploaded By: Montey Hora on 07/11/2015 12:53:25 Wound Measurements Length: (cm) 0.5 Width: (cm) 1 Depth: (cm) 0.1 Area: (cm) 0.393 Volume: (cm) 0.039 % Reduction in Area: % Reduction in Volume: Epithelialization: None Wound Description Full Thickness Without Exposed Classification: Support Structures Wound Margin: Flat and Intact Exudate Medium Amount: Exudate Type: Serous Exudate Color: amber Foul Odor After Cleansing: No Wound Bed Granulation Amount: Large (67-100%) Exposed Structure Granulation Quality: Red Fascia Exposed: No Osier, Travonna R. ([id number]) Necrotic Amount: None Present (0%) Fat Layer Exposed: No Tendon Exposed: No Muscle Exposed: No Joint Exposed: No Bone Exposed: No Limited to Skin Breakdown Periwound Skin Texture Texture Color No Abnormalities Noted: No No Abnormalities Noted: No Callus: No Atrophie Blanche: No Crepitus: No Cyanosis: No Excoriation: No Ecchymosis: No Fluctuance: No Erythema: No Friable: No Hemosiderin Staining: No Induration: No Mottled: No Localized Edema: No Pallor: No Rash: No Rubor: No Scarring: No Moisture No Abnormalities Noted: No Dry / Scaly: No Maceration: No Moist: No Wound Preparation Ulcer Cleansing: Rinsed/Irrigated with Saline Topical Anesthetic Applied: None Treatment Notes Wound #22 (Left Hand - Palm) 1. Cleansed with: Clean wound with Normal Saline 2. Anesthetic Topical Lidocaine 4% cream to wound bed prior to debridement 4. Dressing Applied: Iodoform packing Gauze 5. Secondary Dressing Applied Gauze and  Kerlix/Conform Electronic Signature(s) Signed: 07/11/2015 5:07:12 PM By: Montey Hora Entered By: Montey Hora on 07/11/2015 08:35:05 MONCIA, MCCLURE ([id number]) -------------------------------------------------------------------------------- Vitals Details Patient Name: [name] Date of Service: 07/11/2015 8:00 AM Medical Record Number: [id number] Patient Account Number: [id number] Date of Birth/Sex: 13-Sep-1965 (49 y.o. Female) Treating RN: Montey Hora Primary Care Physician: Arnette Norris Other Clinician: Referring Physician: Arnette Norris Treating Physician/Extender: Frann Rider in Treatment: 11 Vital Signs Time Taken: 08:18 Temperature (F): 98.0 Height (in): 68 Pulse (bpm): 63 Weight (lbs): 220 Respiratory Rate (breaths/min): 18 Body Mass Index (BMI): 33.4 Blood Pressure (mmHg): 111/57 Reference Range: 80 - 120 mg / dl Electronic Signature(s) Signed: 07/11/2015 5:07:12 PM By: Montey Hora Entered By: Montey Hora on 07/11/2015 08:18:16  Hemosiderin Staining: No Localized Edema: Yes Mottled: No Rash: No Pallor: No Scarring: No Rubor: No Moisture Temperature / Pain No  Abnormalities Noted: No Temperature: No Abnormality Dry / Scaly: No Maceration: Yes Moist: No Wound Preparation Ulcer Cleansing: Rinsed/Irrigated with Saline Topical Anesthetic Applied: None Electronic Signature(s) Signed: 07/11/2015 5:07:12 PM By: Montey Hora Entered By: Montey Hora on 07/11/2015 08:29:44 [name] ([id number]) -------------------------------------------------------------------------------- Wound Assessment Details Patient Name: [name] Date of Service: 07/11/2015 8:00 AM Medical Record Number: [id number] Patient Account Number: [id number] Date of Birth/Sex: 04/09/1966 (49 y.o. Female) Treating RN: Montey Hora Primary Care Physician: Arnette Norris Other Clinician: Referring Physician: Arnette Norris Treating Physician/Extender: Frann Rider in Treatment: 11 Wound Status Wound Number: 17 Primary Etiology: Trauma, Other Wound Location: Left Hand - 4th Digit Wound Status: Open Wounding Event: Trauma Date Acquired: 12/04/2014 Weeks Of Treatment: 11 Clustered Wound: No Photos Photo Uploaded By: Montey Hora on 07/11/2015 12:58:07 Wound Measurements Length: (cm) 0.8 Width: (cm) 0.9 Depth: (cm) 0.1 Area: (cm) 0.565 Volume: (cm) 0.057 % Reduction in Area: 10% % Reduction in Volume: 9.5% Wound Description Classification: Partial Thickness Periwound Skin Texture Texture Color No Abnormalities Noted: No No Abnormalities Noted: No Moisture No Abnormalities Noted: No Treatment Notes Wound #17 (Left Hand - 4th Digit) 1. Cleansed with: HAWRAA, KOBZA ([id number]) Clean wound with Normal Saline Notes betadine paint Electronic Signature(s) Signed: 07/11/2015 5:07:12 PM By: Montey Hora Entered By: Montey Hora on 07/11/2015 08:32:11 JHAYLA, MERLINI ([id number]) -------------------------------------------------------------------------------- Wound Assessment Details Patient Name: [name] Date  of Service: 07/11/2015 8:00 AM Medical Record Number: [id number] Patient Account Number: [id number] Date of Birth/Sex: 1966/04/05 (49 y.o. Female) Treating RN: Montey Hora Primary Care Physician: Arnette Norris Other Clinician: Referring Physician: Arnette Norris Treating Physician/Extender: Frann Rider in Treatment: 11 Wound Status Wound Number: 19 Primary Trauma, Other Etiology: Wound Location: Right Hand - 4th Digit Wound Open Wounding Event: Trauma Status: Date Acquired: 06/27/2015 Comorbid Cataracts, Anemia, Type II Diabetes, Weeks Of Treatment: 2 History: End Stage Renal Disease, History of Clustered Wound: No Burn, Neuropathy Photos Photo Uploaded By: Montey Hora on 07/11/2015 13:02:15 Wound Measurements Length: (cm) 1.6 Width: (cm) 1.4 Depth: (cm) 0.1 Area: (cm) 1.759 Volume: (cm) 0.176 % Reduction in Area: -180.1% % Reduction in Volume: -179.4% Epithelialization: None Tunneling: No Undermining: No Wound Description Classification: Partial Thickness Wound Margin: Flat and Intact Exudate Amount: None Present Foul Odor After Cleansing: No Wound Bed Granulation Amount: None Present (0%) Exposed Structure Necrotic Amount: Large (67-100%) Fascia Exposed: No Necrotic Quality: Eschar Fat Layer Exposed: No Tendon Exposed: No Muscle Exposed: No Joint Exposed: No Felten, Stephania R. ([id number]) Bone Exposed: No Limited to Skin Breakdown Periwound Skin Texture Texture Color No Abnormalities Noted: No No Abnormalities Noted: No Callus: No Atrophie Blanche: No Crepitus: No Cyanosis: No Excoriation: No Ecchymosis: No Fluctuance: No Erythema: No Friable: No Hemosiderin Staining: No Induration: No Mottled: No Localized Edema: No Pallor: No Rash: No Rubor: No Scarring: No Moisture No Abnormalities Noted: No Dry / Scaly: No Maceration: No Moist: No Wound Preparation Ulcer Cleansing: Rinsed/Irrigated with Saline Topical Anesthetic  Applied: None Treatment Notes Wound #19 (Right Hand - 4th Digit) 1. Cleansed with: Clean wound with Normal Saline Notes betadine paint Electronic Signature(s) Signed: 07/11/2015 5:07:12 PM By: Montey Hora Entered By: Montey Hora on 07/11/2015 08:26:38 [name] ([id number]) -------------------------------------------------------------------------------- Wound Assessment Details Patient Name: [name] Date of Service: 07/11/2015 8:00 AM Medical Record Number: [id number] Patient Account Number: [id number] Date of

## 2015-07-18 ENCOUNTER — Encounter: Payer: Medicare Other | Admitting: Surgery

## 2015-07-18 DIAGNOSIS — L89310 Pressure ulcer of right buttock, unstageable: Secondary | ICD-10-CM | POA: Diagnosis not present

## 2015-07-19 NOTE — Progress Notes (Signed)
CERYS, BALDUS ([id number]) Visit Report for 07/18/2015 Chief Complaint Document Details Patient Name: [name], [name] Date of Service: 07/18/2015 1:00 PM Medical Record Number: [id number] Patient Account Number: [id number] Date of Birth/Sex: [birth year] (49 y.o. Female) Treating RN: Montey Hora Primary Care Physician: Arnette Norris Other Clinician: Referring Physician: Arnette Norris Treating Physician/Extender: Frann Rider in Treatment: 12 Information Obtained from: Patient Chief Complaint Patient presents to the wound care center for a consult due non healing wound The patient has one area on her right gluteal region and several fingers involved with ulcerations. This has been there for about 3 months. Electronic Signature(s) Signed: 07/18/2015 2:11:05 PM By: [name] MD, FACS Entered By: [name] on 07/18/2015 14:11:05 [name] ([id number]) -------------------------------------------------------------------------------- HPI Details Patient Name: [name] Date of Service: 07/18/2015 1:00 PM Medical Record Number: [id number] Patient Account Number: [id number] Date of Birth/Sex: April 23, 1966 (49 y.o. Female) Treating RN: Montey Hora Primary Care Physician: Arnette Norris Other Clinician: Referring Physician: Arnette Norris Treating Physician/Extender: Frann Rider in Treatment: 12 History of Present Illness Location: right gluteal area and 3 fingers on her right hand and 4 fingers on her left hand involved with ulceration. Quality: Patient reports No Pain. Severity: Patient states wound are getting worse. Duration: Patient has had the wound for > 3 months prior to seeking treatment at the wound center Context: The wound appeared gradually over time Modifying Factors: Patient is currently on renal dialysis and receives treatments 3 times weekly HPI Description: Pleasant 49 year old patient who is known to the wound clinic from  previous visits. She has significant diabetic neuropathy and has no sensation in her hands and feet. She is wheelchair-bound and takes dialysis on a regular basis 3 times a week. 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 Past medical history significant for diabetes mellitus,end-stage renal disease on hemodialysis, coronary artery disease, multiple amputations, sleep apnea, wheelchair bound due to severe peripheral neuropathy. She has now declined quite significantly and is wheelchair-bound all the time. She has developed decubitus ulcers of the skin and has been referred to [place] by her PCP. She was also recently seen by Dr. Celesta Gentile for her feet and he noticed that she had a ulceration of the right third toe and put her on Levaquin and local care. She says she is laying down in bed much more now and has been trying to offload as much as possible. Due to a psychiatric issue she has been biting her fingernails and has caused much ulceration to both hands. Part of it may be related to the medication she was on. 05/14/2015 -- she says she is very sore around her anus and the vagina and has a lot of redness and problems there. I have reviewed her x-rays which were done of both hands and they show: X-ray of the left hand shows no acute fracture, dislocation or convincing osteomyelitis. X-ray of the right hand shows deformities of the distal phalanx of the first second and third digits with peripheral vascular disease but no evidence of acute fracture dislocation or osteomyelitis. 05/28/2015 -- she has seen the dermatologist and will also be seeing the infectious disease doctor soon and is on a proper regimen for her multiple ailments. 06/27/2015 -- she has been wearing a splint on her left hand and last week she had a blister and bleb on the left middle  finger which  was debrided by the burn center at [facility]. Since then she's been having a lot of pain and swelling. She has been worse for the last 3-4 days. 07/11/2015 - she was admitted to [facility] for a badly infected left middle finger infection with sepsis and was admitted between 06/27/2015 to 07/03/2015. She was discharged home on vancomycin and supportive care. during her hospital stay infectious disease treated her with vancomycin and Cipro and an incision and drainage was done of the left third digit. The procedure was done on 06/27/2015 and consisted of an ERRICKA, DIERKES. ([id number]) amputation of the tip of the left long finger at the middle phalanx level irrigation and debridement of full- thickness skin and subcutaneous tissue and tendon.An MRI of the right foot showed no evidence of osteomyelitis. 07/18/2015 -- the patient was reviewed by her orthopedic surgeon Dr. Lenon Curt on noted that since the flexor tendon of the left middle finger was necrotic and was removed that she will have a problem with flexing of her middle finger. He also saw her right thumb was fractured but did not recommend any specific therapy. He asked her to continue with the wound center. Electronic Signature(s) Signed: 07/18/2015 2:15:15 PM By: [name] MD, FACS Entered By: [name] on 07/18/2015 14:15:15 ROSAELENA, CROOKER ([id number]) -------------------------------------------------------------------------------- Physical Exam Details Patient Name: [name] Date of Service: 07/18/2015 1:00 PM Medical Record Number: [id number] Patient Account Number: [id number] Date of Birth/Sex: [birth year] (49 y.o. Female) Treating RN: Montey Hora Primary Care Physician: Arnette Norris Other Clinician: Referring Physician: Arnette Norris Treating Physician/Extender: Frann Rider in Treatment: 12 Constitutional . Pulse regular. Respirations normal and unlabored. Afebrile. . Eyes Nonicteric.  Reactive to light. Ears, Nose, Mouth, and Throat Lips, teeth, and gums WNL.Marland Kitchen Moist mucosa without lesions . Neck supple and nontender. No palpable supraclavicular or cervical adenopathy. Normal sized without goiter. Respiratory WNL. No retractions.. Breath sounds WNL, No rubs, rales, rhonchi, or wheeze.. Cardiovascular Heart rhythm and rate regular, no murmur or gallop.. Pedal Pulses WNL. No clubbing, cyanosis or edema. Chest Breasts symmetical and no nipple discharge.. Breast tissue WNL, no masses, lumps, or tenderness.. Gastrointestinal (GI) Abdomen without masses or tenderness.. No liver or spleen enlargement or tenderness.. Genitourinary (GU) No hydrocele, spermatocele, tenderness of the cord, or testicular mass.Marland Kitchen Penis without lesions.[name] without lesions. No cystocele, or rectocele. Pelvic support intact, no discharge. Marland Kitchen Urethra without masses, tenderness or scarring.Marland Kitchen Lymphatic No adneopathy. No adenopathy. No adenopathy. Musculoskeletal Adexa without tenderness or enlargement.. Digits and nails w/o clubbing, cyanosis, infection, petechiae, ischemia, or inflammatory conditions.. Integumentary (Hair, Skin) No suspicious lesions. No crepitus or fluctuance. No peri-wound warmth or erythema. No masses.Marland Kitchen Psychiatric Judgement and insight Intact.. No evidence of depression, anxiety, or agitation.. Notes Her right thumb has a open wound and so does the ring finger and the fifth finger of the right hand. On the left hand all the fingers are dry and the ulcerated area on her palm is almost completely closed. Electronic Signature(s) JAZMARI, COLLIE ([id number]) Signed: 07/18/2015 2:17:26 PM By: [name] MD, FACS Entered By: [name] on 07/18/2015 14:17:25 PATSI, HAEUSSLER ([id number]) -------------------------------------------------------------------------------- Physician Orders Details Patient Name: [name] Date of Service: 07/18/2015 1:00  PM Medical Record Number: [id number] Patient Account Number: [id number] Date of Birth/Sex: 1965-09-29 (49 y.o. Female) Treating RN: Montey Hora Primary Care Physician: Arnette Norris Other Clinician: Referring Physician: Arnette Norris Treating Physician/Extender: Frann Rider in Treatment: 12 Verbal / Phone Orders:  Yes Clinician: Montey Hora Read Back and Verified: Yes Diagnosis Coding Wound Cleansing Wound #12 Right Hand - 1st Digit o Clean wound with Normal Saline. Wound #14 Left Hand - 1st Digit o Clean wound with Normal Saline. Wound #15 Left Hand - 2nd Digit o Clean wound with Normal Saline. Wound #17 Left Hand - 4th Digit o Clean wound with Normal Saline. Wound #19 Right Hand - 4th Digit o Clean wound with Normal Saline. Wound #20 Right Hand - 5th Digit o Clean wound with Normal Saline. Wound #21 Left Amputation Site - Digit o Clean wound with Normal Saline. Wound #22 Left Hand - Palm o Clean wound with Normal Saline. Primary Wound Dressing Wound #12 Right Hand - 1st Digit o Other: - betadine Wound #14 Left Hand - 1st Digit o Other: - betadine Wound #15 Left Hand - 2nd Digit o Other: - betadine Wound #17 Left Hand - 4th Digit o Other: - betadine Pigman, Kayelynn R. ([id number]) Wound #20 Right Hand - 5th Digit o Other: - betadine Wound #21 Left Amputation Site - Digit o Other: - betadine Wound #19 Right Hand - 4th Digit o Aquacel Ag Wound #22 Left Hand - Palm o Aquacel Ag Secondary Dressing Wound #19 Right Hand - 4th Digit o Gauze and Kerlix/Conform Wound #21 Left Amputation Site - Digit o Gauze and Kerlix/Conform Wound #22 Left Hand - Palm o Gauze and Kerlix/Conform Dressing Change Frequency Wound #12 Right Hand - 1st Digit o Change dressing every day. Wound #14 Left Hand - 1st Digit o Change dressing every day. Wound #15 Left Hand - 2nd Digit o Change dressing every day. Wound #17 Left Hand - 4th  Digit o Change dressing every day. Wound #19 Right Hand - 4th Digit o Change dressing every day. Wound #20 Right Hand - 5th Digit o Change dressing every day. Wound #21 Left Amputation Site - Digit o Change dressing every day. Wound #22 Left Hand - Palm o Change dressing every day. SHEQUILLA, SALLIE ([id number]) Follow-up Appointments Wound #12 Right Hand - 1st Digit o Return Appointment in 2 weeks. Wound #14 Left Hand - 1st Digit o Return Appointment in 2 weeks. Wound #15 Left Hand - 2nd Digit o Return Appointment in 2 weeks. Wound #17 Left Hand - 4th Digit o Return Appointment in 2 weeks. Wound #19 Right Hand - 4th Digit o Return Appointment in 2 weeks. Wound #20 Right Hand - 5th Digit o Return Appointment in 2 weeks. Wound #21 Left Amputation Site - Digit o Return Appointment in 2 weeks. Wound #22 Left Hand - Palm o Return Appointment in 2 weeks. Electronic Signature(s) Signed: 07/18/2015 4:05:55 PM By: [name] MD, FACS Signed: 07/18/2015 5:15:54 PM By: Montey Hora Entered By: Montey Hora on 07/18/2015 13:36:58 KISHAWNA, RAPA ([id number]) -------------------------------------------------------------------------------- Problem List Details Patient Name: [name] Date of Service: 07/18/2015 1:00 PM Medical Record Number: [id number] Patient Account Number: [id number] Date of Birth/Sex: 1966/01/06 (49 y.o. Female) Treating RN: Montey Hora Primary Care Physician: Arnette Norris Other Clinician: Referring Physician: Arnette Norris Treating Physician/Extender: Frann Rider in Treatment: 12 Active Problems ICD-10 Encounter Code Description Active Date Diagnosis E11.40 Type 2 diabetes mellitus with diabetic neuropathy, 04/23/2015 Yes unspecified Z99.2 Dependence on renal dialysis 04/23/2015 Yes L89.310 Pressure ulcer of right buttock, unstageable 04/23/2015 Yes S61.300A Unspecified open wound of right index finger  with damage 04/23/2015 Yes to nail, initial encounter S61.301A Unspecified open wound of left index finger with damage 04/23/2015 Yes to nail, initial  encounter S61.307A Unspecified open wound of left little finger with damage to 04/23/2015 Yes nail, initial encounter S61.303A Unspecified open wound of left middle finger with damage 04/23/2015 Yes to nail, initial encounter Inactive Problems Resolved Problems Electronic Signature(s) Signed: 07/18/2015 2:10:52 PM By: [name] MD, FACS Entered By: [name] on 07/18/2015 14:10:52 REGIS, CAPURRO ([id number]) DIANI, LITTLEPAGE ([id number]) -------------------------------------------------------------------------------- Progress Note Details Patient Name: [name] Date of Service: 07/18/2015 1:00 PM Medical Record Number: [id number] Patient Account Number: [id number] Date of Birth/Sex: [birth year] (49 y.o. Female) Treating RN: Montey Hora Primary Care Physician: Arnette Norris Other Clinician: Referring Physician: Arnette Norris Treating Physician/Extender: Frann Rider in Treatment: 12 Subjective Chief Complaint Information obtained from Patient Patient presents to the wound care center for a consult due non healing wound The patient has one area on her right gluteal region and several fingers involved with ulcerations. This has been there for about 3 months. History of Present Illness (HPI) The following HPI elements were documented for the patient's wound: Location: right gluteal area and 3 fingers on her right hand and 4 fingers on her left hand involved with ulceration. Quality: Patient reports No Pain. Severity: Patient states wound are getting worse. Duration: Patient has had the wound for > 3 months prior to seeking treatment at the wound center Context: The wound appeared gradually over time Modifying Factors: Patient is currently on renal dialysis and receives treatments 3 times weekly Pleasant  49 year old patient who is known to the wound clinic from previous visits. She has significant diabetic neuropathy and has no sensation in her hands and feet. She is wheelchair-bound and takes dialysis on a regular basis 3 times a week. 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 Past medical history significant for diabetes mellitus,end-stage renal disease on hemodialysis, coronary artery disease, multiple amputations, sleep apnea, wheelchair bound due to severe peripheral neuropathy. She has now declined quite significantly and is wheelchair-bound all the time. She has developed decubitus ulcers of the skin and has been referred to [place] by her PCP. She was also recently seen by Dr. Celesta Gentile for her feet and he noticed that she had a ulceration of the right third toe and put her on Levaquin and local care. She says she is laying down in bed much more now and has been trying to offload as much as possible. Due to a psychiatric issue she has been biting her fingernails and has caused much ulceration to both hands. Part of it may be related to the medication she was on. 05/14/2015 -- she says she is very sore around her anus and the vagina and has a lot of redness and problems there. I have reviewed her x-rays which were done of both hands and they show: X-ray of the left hand shows no acute fracture, dislocation or convincing osteomyelitis. X-ray of the right hand shows deformities of the distal phalanx of the first second and third digits with peripheral vascular disease but no evidence of acute fracture dislocation or osteomyelitis. TAVEN, WOODIS ([id number]) 05/28/2015 -- she has seen the dermatologist and will also be seeing the infectious disease doctor soon and is on a proper regimen for her multiple ailments. 06/27/2015 -- she has been wearing a  splint on her left hand and last week she had a blister and bleb on the left middle finger which was debrided  by the burn center at [facility]. Since then she's been having a lot of pain and swelling. She has been worse for the last 3-4 days. 07/11/2015 - she was admitted to [facility] for a badly infected left middle finger infection with sepsis and was admitted between 06/27/2015 to 07/03/2015. She was discharged home on vancomycin and supportive care. during her hospital stay infectious disease treated her with vancomycin and Cipro and an incision and drainage was done of the left third digit. The procedure was done on 06/27/2015 and consisted of an amputation of the tip of the left long finger at the middle phalanx level irrigation and debridement of full- thickness skin and subcutaneous tissue and tendon.An MRI of the right foot showed no evidence of osteomyelitis. 07/18/2015 -- the patient was reviewed by her orthopedic surgeon Dr. Lenon Curt on noted that since the flexor tendon of the left middle finger was necrotic and was removed that she will have a problem with flexing of her middle finger. He also saw her right thumb was fractured but did not recommend any specific therapy. He asked her to continue with the wound center. Objective Constitutional Pulse regular. Respirations normal and unlabored. Afebrile. Vitals Time Taken: 1:05 PM, Height: 68 in, Weight: 220 lbs, BMI: 33.4, Temperature: 97.5 F, Pulse: 65 bpm, Respiratory Rate: 18 breaths/min, Blood Pressure: 105/48 mmHg. Eyes Nonicteric. Reactive to light. Ears, Nose, Mouth, and Throat Lips, teeth, and gums WNL.Marland Kitchen Moist mucosa without lesions . Neck supple and nontender. No palpable supraclavicular or cervical adenopathy. Normal sized without goiter. Respiratory WNL. No retractions.. Breath sounds WNL, No rubs, rales, rhonchi, or wheeze.. Cardiovascular Heart rhythm and rate regular, no murmur or gallop.. Pedal Pulses WNL. No  clubbing, cyanosis or edema. Chest Breasts symmetical and no nipple discharge.. Breast tissue WNL, no masses, lumps, or tenderness.Marland Kitchen LILLIA, DESMET R. ([id number]) Gastrointestinal (GI) Abdomen without masses or tenderness.. No liver or spleen enlargement or tenderness.. Genitourinary (GU) No hydrocele, spermatocele, tenderness of the cord, or testicular mass.Marland Kitchen Penis without lesions.[name] without lesions. No cystocele, or rectocele. Pelvic support intact, no discharge. Marland Kitchen Urethra without masses, tenderness or scarring.Marland Kitchen Lymphatic No adneopathy. No adenopathy. No adenopathy. Musculoskeletal Adexa without tenderness or enlargement.. Digits and nails w/o clubbing, cyanosis, infection, petechiae, ischemia, or inflammatory conditions.Marland Kitchen Psychiatric Judgement and insight Intact.. No evidence of depression, anxiety, or agitation.. General Notes: Her right thumb has a open wound and so does the ring finger and the fifth finger of the right hand. On the left hand all the fingers are dry and the ulcerated area on her palm is almost completely closed. Integumentary (Hair, Skin) No suspicious lesions. No crepitus or fluctuance. No peri-wound warmth or erythema. No masses.. Wound #12 status is Open. Original cause of wound was Trauma. The wound is located on the Right Hand - 1st Digit. The wound measures 1cm length x 1.5cm width x 0.1cm depth; 1.178cm^2 area and 0.118cm^3 volume. The wound is limited to skin breakdown. There is no tunneling or undermining noted. There is a none present amount of drainage noted. The wound margin is flat and intact. There is medium (34-66%) red granulation within the wound bed. There is a medium (34-66%) amount of necrotic tissue within the wound bed including Eschar. The periwound skin appearance exhibited: Dry/Scaly. The periwound skin appearance did not exhibit: Callus, Crepitus, Excoriation, Fluctuance, Friable, Induration, Localized Edema, Rash, Scarring,  Maceration, Moist, Atrophie Blanche, Cyanosis, Ecchymosis, Hemosiderin Staining, Mottled, Pallor, Rubor, Erythema. Periwound temperature was noted as No Abnormality. Wound #  14 status is Open. Original cause of wound was Trauma. The wound is located on the Left Hand - 1st Digit. The wound measures 1.1cm length x 1.3cm width x 0.1cm depth; 1.123cm^2 area and 0.112cm^3 volume. The wound is limited to skin breakdown. There is no tunneling or undermining noted. There is a none present amount of drainage noted. The wound margin is flat and intact. There is medium (34-66%) red granulation within the wound bed. There is a medium (34-66%) amount of necrotic tissue within the wound bed including Eschar. The periwound skin appearance exhibited: Dry/Scaly. The periwound skin appearance did not exhibit: Callus, Crepitus, Excoriation, Fluctuance, Friable, Induration, Localized Edema, Rash, Scarring, Maceration, Moist, Atrophie Blanche, Cyanosis, Ecchymosis, Hemosiderin Staining, Mottled, Pallor, Rubor, Erythema. Periwound temperature was noted as No Abnormality. Wound #15 status is Open. Original cause of wound was Trauma. The wound is located on the Left Hand - 2nd Digit. The wound measures 0.3cm length x 1cm width x 0.1cm depth; 0.236cm^2 area and 0.024cm^3 volume. The wound is limited to skin breakdown. There is no tunneling or undermining noted. There is a none present amount of drainage noted. The wound margin is distinct with the outline attached to the [place] ([id number]) wound base. There is medium (34-66%) red granulation within the wound bed. There is a medium (34-66%) amount of necrotic tissue within the wound bed including Eschar. The periwound skin appearance exhibited: Dry/Scaly. The periwound skin appearance did not exhibit: Callus, Crepitus, Excoriation, Fluctuance, Friable, Induration, Localized Edema, Rash, Scarring, Maceration, Moist, Atrophie Blanche, Cyanosis, Ecchymosis,  Hemosiderin Staining, Mottled, Pallor, Rubor, Erythema. Periwound temperature was noted as No Abnormality. Wound #17 status is Open. Original cause of wound was Trauma. The wound is located on the Left Hand - 4th Digit. The wound measures 0.5cm length x 0.9cm width x 0.1cm depth; 0.353cm^2 area and 0.035cm^3 volume. The wound is limited to skin breakdown. There is no tunneling or undermining noted. There is a small amount of serous drainage noted. The wound margin is flat and intact. There is medium (34-66%) red granulation within the wound bed. There is a medium (34-66%) amount of necrotic tissue within the wound bed including Eschar. The periwound skin appearance did not exhibit: Callus, Crepitus, Excoriation, Fluctuance, Friable, Induration, Localized Edema, Rash, Scarring, Dry/Scaly, Maceration, Moist, Atrophie Blanche, Cyanosis, Ecchymosis, Hemosiderin Staining, Mottled, Pallor, Rubor, Erythema. Wound #19 status is Open. Original cause of wound was Trauma. The wound is located on the Right Hand - 4th Digit. The wound measures 1.5cm length x 1.5cm width x 0.1cm depth; 1.767cm^2 area and 0.177cm^3 volume. The wound is limited to skin breakdown. There is no tunneling or undermining noted. There is a none present amount of drainage noted. The wound margin is flat and intact. There is medium (34-66%) red granulation within the wound bed. There is a medium (34-66%) amount of necrotic tissue within the wound bed including Eschar. The periwound skin appearance did not exhibit: Callus, Crepitus, Excoriation, Fluctuance, Friable, Induration, Localized Edema, Rash, Scarring, Dry/Scaly, Maceration, Moist, Atrophie Blanche, Cyanosis, Ecchymosis, Hemosiderin Staining, Mottled, Pallor, Rubor, Erythema. Wound #20 status is Open. Original cause of wound was Trauma. The wound is located on the Right Hand - 5th Digit. The wound measures 0.3cm length x 0.4cm width x 0.1cm depth; 0.094cm^2 area and 0.009cm^3  volume. The wound is limited to skin breakdown. There is no tunneling or undermining noted. There is a none present amount of drainage noted. The wound margin is flat and intact. There is medium (34-66%) red  granulation within the wound bed. There is a medium (34-66%) amount of necrotic tissue within the wound bed including Eschar. The periwound skin appearance did not exhibit: Callus, Crepitus, Excoriation, Fluctuance, Friable, Induration, Localized Edema, Rash, Scarring, Dry/Scaly, Maceration, Moist, Atrophie Blanche, Cyanosis, Ecchymosis, Hemosiderin Staining, Mottled, Pallor, Rubor, Erythema. Wound #21 status is Open. Original cause of wound was Surgical Injury. 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 The wound measures 0.3cm length x 1cm width x 0.1cm depth; 0.236cm^2 area and 0.024cm^3 volume. The wound is limited to skin breakdown. There is no tunneling or undermining noted. There is a medium amount of serous drainage noted. The wound margin is flat and intact. There is medium (34-66%) red granulation within the wound bed. There is a medium (34-66%) amount of necrotic tissue within the wound bed including Eschar. The periwound skin appearance exhibited: Localized Edema, Erythema. The periwound skin appearance did not exhibit: Callus, Crepitus, Excoriation, Fluctuance, Friable, Induration, Rash, Scarring, Dry/Scaly, Maceration, Moist, Atrophie Blanche, Cyanosis, Ecchymosis, Hemosiderin Staining, Mottled, Pallor, Rubor. The surrounding wound skin color is noted with erythema which is circumferential. Wound #22 status is Open. Original cause of wound was Surgical Injury. The wound is located on the Left Hand - Palm. The wound measures 0.5cm length x 0.4cm width x 0.1cm depth; 0.157cm^2 area and 0.016cm^3 volume. The wound is limited to skin breakdown. There is no tunneling or undermining noted. There is a medium amount of serous drainage noted. The wound margin is flat and  intact. There is large (67-100%) red granulation within the wound bed. There is no necrotic tissue within the wound bed. The periwound skin appearance did not exhibit: Callus, Crepitus, Excoriation, Fluctuance, Friable, Induration, Hopson, Alyviah R. ([id number]) Localized Edema, Rash, Scarring, Dry/Scaly, Maceration, Moist, Atrophie Blanche, Cyanosis, Ecchymosis, Hemosiderin Staining, Mottled, Pallor, Rubor, Erythema. 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 The left hand will be painted with Betadine. She is urged to keep clear of picking on her fingers and also urged to do her dressing regularly. She will come back and see me after the Thanksgiving holidays. Plan Wound Cleansing: Wound #12 Right Hand - 1st Digit: Clean wound with Normal Saline. Wound #14 Left Hand - 1st Digit: Clean wound with Normal Saline. Wound #15 Left Hand - 2nd Digit: Clean wound with Normal Saline. Wound #17 Left Hand - 4th Digit: Clean wound with Normal Saline. Wound #19 Right Hand - 4th Digit: Clean wound with Normal Saline. Wound #20 Right Hand - 5th Digit: Clean wound with Normal Saline. 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 Wound #22 Left Hand - Palm: Clean wound with Normal Saline. 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 Wound #14 Left Hand - 1st Digit: Change dressing every day. Wound #15 Left Hand - 2nd Digit: Change dressing every day. Wound #17 Left Hand - 4th Digit: Change dressing every day. Wound #19 Right Hand - 4th Digit: Change dressing every day. Wound #20 Right Hand - 5th Digit: Change dressing every day. 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 Wound #22 Left Hand - Palm: Change dressing every day. Follow-up Appointments: Wound #12 Right Hand - 1st Digit: Return Appointment in 2 weeks. Wound #14 Left Hand - 1st Digit: Return Appointment in 2 weeks. Wound #15 Left Hand - 2nd Digit: Return Appointment in 2 weeks. Wound #17 Left Hand - 4th Digit: Return Appointment in 2 weeks. Wound #19 Right Hand - 4th Digit: SHALONDA, LANGEN. ([id number]) Return Appointment in 2 weeks. Wound #20 Right Hand - 5th Digit: Return Appointment in 2 weeks. 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 Wound #22 Left Hand - Palm: Return Appointment in 2 weeks. I have recommended dressing with silver alginate on all the open areas in the right hand. The left hand will be painted with Betadine. She is urged to keep clear of picking on her fingers and also urged to do her  dressing regularly. She will come back and see me after the Thanksgiving holidays. Electronic Signature(s) Signed: 07/18/2015 2:18:37 PM By: [name] MD, FACS Entered By: [name] on 07/18/2015 14:18:37 [name] ([id number]) -------------------------------------------------------------------------------- SuperBill Details Patient Name: [name] Date of Service: 07/18/2015 Medical Record Number: [id number] Patient Account Number: [id number] Date of Birth/Sex: 09/13/1965 (49 y.o. Female) Treating RN: Montey Hora Primary Care Physician: Arnette Norris Other Clinician: Referring Physician: Arnette Norris Treating Physician/Extender: Frann Rider in Treatment: 12 Diagnosis Coding ICD-10 Codes Code Description E11.40 Type 2 diabetes mellitus with diabetic neuropathy, unspecified Z99.2 Dependence on renal dialysis L89.310 Pressure ulcer of right buttock, unstageable S61.300A Unspecified open wound of right index finger with damage to nail, initial encounter S61.301A Unspecified open wound of left index finger with damage to nail, initial encounter S61.307A Unspecified open wound of left little finger with damage to nail, initial encounter S61.303A Unspecified open wound of left middle finger with damage to nail, initial encounter Facility Procedures CPT4 Code: [id number] Description: [id number] - WOUND CARE VISIT-LEV 5 EST PT Modifier: Quantity: 1 Physician Procedures CPT4: Description Modifier Quantity Code [id number] - WC PHYS LEVEL 3 - EST PT 1 ICD-10 Description Diagnosis E11.40 Type 2 diabetes mellitus with diabetic neuropathy, unspecified L89.310 Pressure ulcer of right buttock, unstageable S61.300A  Unspecified open wound of right index finger with damage to nail, initial encounter S61.301A Unspecified open wound of left index finger with damage to nail, initial encounter Electronic Signature(s) Signed: 07/18/2015 2:18:52 PM By: [name] MD,  FACS Entered By: [name] on 07/18/2015 14:18:51

## 2015-07-19 NOTE — Progress Notes (Signed)
07/18/2015 13:32:46 [name], [name] ([id number]) -------------------------------------------------------------------------------- Vitals Details Patient Name: [name] Date of Service: 07/18/2015 1:00 PM Medical Record Number: [id number] Patient Account Number: [id number] Date of Birth/Sex: [birth year] (49 y.o. Female) Treating RN: Montey Hora Primary Care Physician: Arnette Norris Other Clinician: Referring Physician: Arnette Norris Treating Physician/Extender: Frann Rider in Treatment: 12 Vital Signs Time Taken: 13:05 Temperature (F): 97.5 Height (in): 68 Pulse (bpm): 65 Weight (lbs): 220 Respiratory Rate (breaths/min): 18 Body Mass Index (BMI): 33.4 Blood Pressure (mmHg): 105/48 Reference Range: 80 - 120 mg / dl Electronic Signature(s) Signed: 07/18/2015 5:15:54 PM By: Montey Hora Entered By: Montey Hora on 07/18/2015 13:07:32  Texture Color No Abnormalities Noted: No No Abnormalities Noted: No Callus: No Atrophie Blanche: No Crepitus: No Cyanosis: No Excoriation: No Ecchymosis: No Fluctuance: No Erythema: No Friable: No Hemosiderin Staining: No Induration: No Mottled: No Localized Edema: No Pallor: No Rash: No Rubor: No Scarring: No Temperature / Pain Moisture Temperature: No Abnormality No Abnormalities Noted: No Dry / Scaly: Yes Maceration: No Moist: No Wound Preparation Ulcer Cleansing: Rinsed/Irrigated with Saline Topical Anesthetic Applied: None Treatment Notes Wound #15 (Left Hand - 2nd Digit) 1. Cleansed with: Clean wound with Normal Saline 4. Dressing Applied: Other dressing (specify in notes) Notes betadine paint Electronic Signature(s) Signed: 07/18/2015 5:15:54 PM By: Montey Hora Entered By: Montey Hora on 07/18/2015 13:30:22 [name]  ([id number]) -------------------------------------------------------------------------------- Wound Assessment Details Patient Name: [name] Date of Service: 07/18/2015 1:00 PM Medical Record Number: [id number] Patient Account Number: [id number] Date of Birth/Sex: 05-Nov-1965 (49 y.o. Female) Treating RN: Montey Hora Primary Care Physician: Arnette Norris Other Clinician: Referring Physician: Arnette Norris Treating Physician/Extender: Frann Rider in Treatment: 12 Wound Status Wound Number: 17 Primary Trauma, Other Etiology: Wound Location: Left Hand - 4th Digit Wound Open Wounding Event: Trauma Status: Date Acquired: 12/04/2014 Comorbid Cataracts, Anemia, Type II Diabetes, Weeks Of Treatment: 12 History: End Stage Renal Disease, History of Clustered Wound: No Burn, Neuropathy Photos Photo Uploaded By: Montey Hora on 07/18/2015 16:12:11 Wound Measurements Length: (cm) 0.5 Width: (cm) 0.9 Depth: (cm) 0.1 Area: (cm) 0.353 Volume: (cm) 0.035 % Reduction in Area: 43.8% % Reduction in Volume: 44.4% Tunneling: No Undermining: No Wound Description Classification: Partial Thickness Wound Margin: Flat and Intact Exudate Amount: Small Exudate Type: Serous Exudate Color: amber Foul Odor After Cleansing: No Wound Bed Granulation Amount: Medium (34-66%) Exposed Structure Granulation Quality: Red Fascia Exposed: No Necrotic Amount: Medium (34-66%) Fat Layer Exposed: No Necrotic Quality: Eschar Tendon Exposed: No [name], Tonya R. ([id number]) Muscle Exposed: No Joint Exposed: No Bone Exposed: No Limited to Skin Breakdown Periwound Skin Texture Texture Color No Abnormalities Noted: No No Abnormalities Noted: No Callus: No Atrophie Blanche: No Crepitus: No Cyanosis: No Excoriation: No Ecchymosis: No Fluctuance: No Erythema: No Friable: No Hemosiderin Staining: No Induration: No Mottled: No Localized Edema: No Pallor: No Rash: No Rubor:  No Scarring: No Moisture No Abnormalities Noted: No Dry / Scaly: No Maceration: No Moist: No Wound Preparation Ulcer Cleansing: Rinsed/Irrigated with Saline Topical Anesthetic Applied: None Treatment Notes Wound #17 (Left Hand - 4th Digit) 1. Cleansed with: Clean wound with Normal Saline 4. Dressing Applied: Other dressing (specify in notes) Notes betadine paint Electronic Signature(s) Signed: 07/18/2015 5:15:54 PM By: Montey Hora Entered By: Montey Hora on 07/18/2015 13:30:58 [name] ([id number]) -------------------------------------------------------------------------------- Wound Assessment Details Patient Name: [name] Date of Service: 07/18/2015 1:00 PM Medical Record Number: [id number] Patient Account Number: [id number] Date of Birth/Sex: 06/17/1966 (49 y.o. Female) Treating RN: Montey Hora Primary Care Physician: Arnette Norris Other Clinician: Referring Physician: Arnette Norris Treating Physician/Extender: Frann Rider in Treatment: 12 Wound Status Wound Number: 19 Primary Trauma, Other Etiology: Wound Location: Right Hand - 4th Digit Wound Open Wounding Event: Trauma Status: Date Acquired: 06/27/2015 Comorbid Cataracts, Anemia, Type II Diabetes, Weeks Of Treatment: 3 History: End Stage Renal Disease, History of Clustered Wound: No Burn, Neuropathy Photos Photo Uploaded By: Montey Hora on 07/18/2015 16:12:12 Wound Measurements Length: (cm) 1.5 Width: (cm) 1.5 Depth: (cm) 0.1 Area: (cm) 1.767 Volume: (cm) 0.177 % Reduction in Area: -181.4% % Reduction in Volume: -181% Epithelialization: None Tunneling: No Undermining: No  07/18/2015 13:32:46 [name], [name] ([id number]) -------------------------------------------------------------------------------- Vitals Details Patient Name: [name] Date of Service: 07/18/2015 1:00 PM Medical Record Number: [id number] Patient Account Number: [id number] Date of Birth/Sex: [birth year] (49 y.o. Female) Treating RN: Montey Hora Primary Care Physician: Arnette Norris Other Clinician: Referring Physician: Arnette Norris Treating Physician/Extender: Frann Rider in Treatment: 12 Vital Signs Time Taken: 13:05 Temperature (F): 97.5 Height (in): 68 Pulse (bpm): 65 Weight (lbs): 220 Respiratory Rate (breaths/min): 18 Body Mass Index (BMI): 33.4 Blood Pressure (mmHg): 105/48 Reference Range: 80 - 120 mg / dl Electronic Signature(s) Signed: 07/18/2015 5:15:54 PM By: Montey Hora Entered By: Montey Hora on 07/18/2015 13:07:32  07/18/2015 13:32:46 [name], [name] ([id number]) -------------------------------------------------------------------------------- Vitals Details Patient Name: [name] Date of Service: 07/18/2015 1:00 PM Medical Record Number: [id number] Patient Account Number: [id number] Date of Birth/Sex: [birth year] (49 y.o. Female) Treating RN: Montey Hora Primary Care Physician: Arnette Norris Other Clinician: Referring Physician: Arnette Norris Treating Physician/Extender: Frann Rider in Treatment: 12 Vital Signs Time Taken: 13:05 Temperature (F): 97.5 Height (in): 68 Pulse (bpm): 65 Weight (lbs): 220 Respiratory Rate (breaths/min): 18 Body Mass Index (BMI): 33.4 Blood Pressure (mmHg): 105/48 Reference Range: 80 - 120 mg / dl Electronic Signature(s) Signed: 07/18/2015 5:15:54 PM By: Montey Hora Entered By: Montey Hora on 07/18/2015 13:07:32  [name], [name] ([id number]) Visit Report for 07/18/2015 Arrival Information Details Patient Name: [name], [name] Date of Service: 07/18/2015 1:00 PM Medical Record Number: [id number] Patient Account Number: [id number] Date of Birth/Sex: 04-Apr-1966 (49 y.o. Female) Treating RN: Montey Hora Primary Care Physician: Arnette Norris Other Clinician: Referring Physician: Arnette Norris Treating Physician/Extender: Frann Rider in Treatment: 12 Visit Information History Since Last Visit Added or deleted any medications: No Patient Arrived: Wheel Chair Any new allergies or adverse reactions: No Arrival Time: 13:00 Had a fall or experienced change in No activities of daily living that may affect Accompanied By: spouse risk of falls: Transfer Assistance: None Signs or symptoms of abuse/neglect since last No Patient Identification Verified: Yes visito Secondary Verification Process Yes Hospitalized since last visit: No Completed: Pain Present Now: No Patient Has Alerts: Yes Patient Alerts: DMII Electronic Signature(s) Signed: 07/18/2015 5:15:54 PM By: Montey Hora Entered By: Montey Hora on 07/18/2015 13:06:54 [name] ([id number]) -------------------------------------------------------------------------------- Clinic Level of Care Assessment Details Patient Name: [name] Date of Service: 07/18/2015 1:00 PM Medical Record Number: [id number] Patient Account Number: [id number] Date of Birth/Sex: 15-Aug-1966 (49 y.o. Female) Treating RN: Montey Hora Primary Care Physician: Arnette Norris Other Clinician: Referring Physician: Arnette Norris Treating Physician/Extender: Frann Rider in Treatment: 12 Clinic Level of Care Assessment Items TOOL 4 Quantity Score []  - Use when only an EandM is performed on FOLLOW-UP visit 0 ASSESSMENTS - Nursing Assessment / Reassessment X - Reassessment of Co-morbidities (includes updates in patient status) 1  10 X - Reassessment of Adherence to Treatment Plan 1 5 ASSESSMENTS - Wound and Skin Assessment / Reassessment []  - Simple Wound Assessment / Reassessment - one wound 0 X - Complex Wound Assessment / Reassessment - multiple wounds 8 5 []  - Dermatologic / Skin Assessment (not related to wound area) 0 ASSESSMENTS - Focused Assessment []  - Circumferential Edema Measurements - multi extremities 0 []  - Nutritional Assessment / Counseling / Intervention 0 []  - Lower Extremity Assessment (monofilament, tuning fork, pulses) 0 []  - Peripheral Arterial Disease Assessment (using hand held doppler) 0 ASSESSMENTS - Ostomy and/or Continence Assessment and Care []  - Incontinence Assessment and Management 0 []  - Ostomy Care Assessment and Management (repouching, etc.) 0 PROCESS - Coordination of Care X - Simple Patient / Family Education for ongoing care 1 15 []  - Complex (extensive) Patient / Family Education for ongoing care 0 []  - Staff obtains Programmer, systems, Records, Test Results / Process Orders 0 []  - Staff telephones HHA, Nursing Homes / Clarify orders / etc 0 []  - Routine Transfer to another Facility (non-emergent condition) 0 [name], Tonya R. ([id number]) []  - Routine Hospital Admission (non-emergent condition) 0 []  - New Admissions / Biomedical engineer / Ordering NPWT, Apligraf, etc. 0 []  - Emergency Hospital Admission (emergent condition) 0 X - Simple Discharge Coordination 1 10 []  - Complex (extensive) Discharge Coordination 0 PROCESS - Special Needs []  - Pediatric / Minor Patient Management 0 []  - Isolation Patient Management 0 []  - Hearing / Language / Visual special needs 0 []  - Assessment of Community assistance (transportation, D/C planning, etc.) 0 []  - Additional assistance / Altered mentation 0 []  - Support Surface(s) Assessment (bed, cushion, seat, etc.) 0 INTERVENTIONS - Wound Cleansing / Measurement []  - Simple Wound Cleansing - one wound 0 X - Complex Wound Cleansing -  multiple wounds 8 5 X - Wound Imaging (photographs - any number of wounds) 1 5 []  - Wound Tracing (instead of photographs) 0 []  - Simple  07/18/2015 13:32:46 [name], [name] ([id number]) -------------------------------------------------------------------------------- Vitals Details Patient Name: [name] Date of Service: 07/18/2015 1:00 PM Medical Record Number: [id number] Patient Account Number: [id number] Date of Birth/Sex: [birth year] (49 y.o. Female) Treating RN: Montey Hora Primary Care Physician: Arnette Norris Other Clinician: Referring Physician: Arnette Norris Treating Physician/Extender: Frann Rider in Treatment: 12 Vital Signs Time Taken: 13:05 Temperature (F): 97.5 Height (in): 68 Pulse (bpm): 65 Weight (lbs): 220 Respiratory Rate (breaths/min): 18 Body Mass Index (BMI): 33.4 Blood Pressure (mmHg): 105/48 Reference Range: 80 - 120 mg / dl Electronic Signature(s) Signed: 07/18/2015 5:15:54 PM By: Montey Hora Entered By: Montey Hora on 07/18/2015 13:07:32  07/18/2015 13:32:46 [name], [name] ([id number]) -------------------------------------------------------------------------------- Vitals Details Patient Name: [name] Date of Service: 07/18/2015 1:00 PM Medical Record Number: [id number] Patient Account Number: [id number] Date of Birth/Sex: [birth year] (49 y.o. Female) Treating RN: Montey Hora Primary Care Physician: Arnette Norris Other Clinician: Referring Physician: Arnette Norris Treating Physician/Extender: Frann Rider in Treatment: 12 Vital Signs Time Taken: 13:05 Temperature (F): 97.5 Height (in): 68 Pulse (bpm): 65 Weight (lbs): 220 Respiratory Rate (breaths/min): 18 Body Mass Index (BMI): 33.4 Blood Pressure (mmHg): 105/48 Reference Range: 80 - 120 mg / dl Electronic Signature(s) Signed: 07/18/2015 5:15:54 PM By: Montey Hora Entered By: Montey Hora on 07/18/2015 13:07:32  07/18/2015 13:32:46 [name], [name] ([id number]) -------------------------------------------------------------------------------- Vitals Details Patient Name: [name] Date of Service: 07/18/2015 1:00 PM Medical Record Number: [id number] Patient Account Number: [id number] Date of Birth/Sex: [birth year] (49 y.o. Female) Treating RN: Montey Hora Primary Care Physician: Arnette Norris Other Clinician: Referring Physician: Arnette Norris Treating Physician/Extender: Frann Rider in Treatment: 12 Vital Signs Time Taken: 13:05 Temperature (F): 97.5 Height (in): 68 Pulse (bpm): 65 Weight (lbs): 220 Respiratory Rate (breaths/min): 18 Body Mass Index (BMI): 33.4 Blood Pressure (mmHg): 105/48 Reference Range: 80 - 120 mg / dl Electronic Signature(s) Signed: 07/18/2015 5:15:54 PM By: Montey Hora Entered By: Montey Hora on 07/18/2015 13:07:32  [name], [name] ([id number]) Visit Report for 07/18/2015 Arrival Information Details Patient Name: [name], [name] Date of Service: 07/18/2015 1:00 PM Medical Record Number: [id number] Patient Account Number: [id number] Date of Birth/Sex: 04-Apr-1966 (49 y.o. Female) Treating RN: Montey Hora Primary Care Physician: Arnette Norris Other Clinician: Referring Physician: Arnette Norris Treating Physician/Extender: Frann Rider in Treatment: 12 Visit Information History Since Last Visit Added or deleted any medications: No Patient Arrived: Wheel Chair Any new allergies or adverse reactions: No Arrival Time: 13:00 Had a fall or experienced change in No activities of daily living that may affect Accompanied By: spouse risk of falls: Transfer Assistance: None Signs or symptoms of abuse/neglect since last No Patient Identification Verified: Yes visito Secondary Verification Process Yes Hospitalized since last visit: No Completed: Pain Present Now: No Patient Has Alerts: Yes Patient Alerts: DMII Electronic Signature(s) Signed: 07/18/2015 5:15:54 PM By: Montey Hora Entered By: Montey Hora on 07/18/2015 13:06:54 [name] ([id number]) -------------------------------------------------------------------------------- Clinic Level of Care Assessment Details Patient Name: [name] Date of Service: 07/18/2015 1:00 PM Medical Record Number: [id number] Patient Account Number: [id number] Date of Birth/Sex: 15-Aug-1966 (49 y.o. Female) Treating RN: Montey Hora Primary Care Physician: Arnette Norris Other Clinician: Referring Physician: Arnette Norris Treating Physician/Extender: Frann Rider in Treatment: 12 Clinic Level of Care Assessment Items TOOL 4 Quantity Score []  - Use when only an EandM is performed on FOLLOW-UP visit 0 ASSESSMENTS - Nursing Assessment / Reassessment X - Reassessment of Co-morbidities (includes updates in patient status) 1  10 X - Reassessment of Adherence to Treatment Plan 1 5 ASSESSMENTS - Wound and Skin Assessment / Reassessment []  - Simple Wound Assessment / Reassessment - one wound 0 X - Complex Wound Assessment / Reassessment - multiple wounds 8 5 []  - Dermatologic / Skin Assessment (not related to wound area) 0 ASSESSMENTS - Focused Assessment []  - Circumferential Edema Measurements - multi extremities 0 []  - Nutritional Assessment / Counseling / Intervention 0 []  - Lower Extremity Assessment (monofilament, tuning fork, pulses) 0 []  - Peripheral Arterial Disease Assessment (using hand held doppler) 0 ASSESSMENTS - Ostomy and/or Continence Assessment and Care []  - Incontinence Assessment and Management 0 []  - Ostomy Care Assessment and Management (repouching, etc.) 0 PROCESS - Coordination of Care X - Simple Patient / Family Education for ongoing care 1 15 []  - Complex (extensive) Patient / Family Education for ongoing care 0 []  - Staff obtains Programmer, systems, Records, Test Results / Process Orders 0 []  - Staff telephones HHA, Nursing Homes / Clarify orders / etc 0 []  - Routine Transfer to another Facility (non-emergent condition) 0 [name], Tonya R. ([id number]) []  - Routine Hospital Admission (non-emergent condition) 0 []  - New Admissions / Biomedical engineer / Ordering NPWT, Apligraf, etc. 0 []  - Emergency Hospital Admission (emergent condition) 0 X - Simple Discharge Coordination 1 10 []  - Complex (extensive) Discharge Coordination 0 PROCESS - Special Needs []  - Pediatric / Minor Patient Management 0 []  - Isolation Patient Management 0 []  - Hearing / Language / Visual special needs 0 []  - Assessment of Community assistance (transportation, D/C planning, etc.) 0 []  - Additional assistance / Altered mentation 0 []  - Support Surface(s) Assessment (bed, cushion, seat, etc.) 0 INTERVENTIONS - Wound Cleansing / Measurement []  - Simple Wound Cleansing - one wound 0 X - Complex Wound Cleansing -  multiple wounds 8 5 X - Wound Imaging (photographs - any number of wounds) 1 5 []  - Wound Tracing (instead of photographs) 0 []  - Simple  Texture Color No Abnormalities Noted: No No Abnormalities Noted: No Callus: No Atrophie Blanche: No Crepitus: No Cyanosis: No Excoriation: No Ecchymosis: No Fluctuance: No Erythema: No Friable: No Hemosiderin Staining: No Induration: No Mottled: No Localized Edema: No Pallor: No Rash: No Rubor: No Scarring: No Temperature / Pain Moisture Temperature: No Abnormality No Abnormalities Noted: No Dry / Scaly: Yes Maceration: No Moist: No Wound Preparation Ulcer Cleansing: Rinsed/Irrigated with Saline Topical Anesthetic Applied: None Treatment Notes Wound #15 (Left Hand - 2nd Digit) 1. Cleansed with: Clean wound with Normal Saline 4. Dressing Applied: Other dressing (specify in notes) Notes betadine paint Electronic Signature(s) Signed: 07/18/2015 5:15:54 PM By: Montey Hora Entered By: Montey Hora on 07/18/2015 13:30:22 [name]  ([id number]) -------------------------------------------------------------------------------- Wound Assessment Details Patient Name: [name] Date of Service: 07/18/2015 1:00 PM Medical Record Number: [id number] Patient Account Number: [id number] Date of Birth/Sex: 05-Nov-1965 (49 y.o. Female) Treating RN: Montey Hora Primary Care Physician: Arnette Norris Other Clinician: Referring Physician: Arnette Norris Treating Physician/Extender: Frann Rider in Treatment: 12 Wound Status Wound Number: 17 Primary Trauma, Other Etiology: Wound Location: Left Hand - 4th Digit Wound Open Wounding Event: Trauma Status: Date Acquired: 12/04/2014 Comorbid Cataracts, Anemia, Type II Diabetes, Weeks Of Treatment: 12 History: End Stage Renal Disease, History of Clustered Wound: No Burn, Neuropathy Photos Photo Uploaded By: Montey Hora on 07/18/2015 16:12:11 Wound Measurements Length: (cm) 0.5 Width: (cm) 0.9 Depth: (cm) 0.1 Area: (cm) 0.353 Volume: (cm) 0.035 % Reduction in Area: 43.8% % Reduction in Volume: 44.4% Tunneling: No Undermining: No Wound Description Classification: Partial Thickness Wound Margin: Flat and Intact Exudate Amount: Small Exudate Type: Serous Exudate Color: amber Foul Odor After Cleansing: No Wound Bed Granulation Amount: Medium (34-66%) Exposed Structure Granulation Quality: Red Fascia Exposed: No Necrotic Amount: Medium (34-66%) Fat Layer Exposed: No Necrotic Quality: Eschar Tendon Exposed: No [name], Tonya R. ([id number]) Muscle Exposed: No Joint Exposed: No Bone Exposed: No Limited to Skin Breakdown Periwound Skin Texture Texture Color No Abnormalities Noted: No No Abnormalities Noted: No Callus: No Atrophie Blanche: No Crepitus: No Cyanosis: No Excoriation: No Ecchymosis: No Fluctuance: No Erythema: No Friable: No Hemosiderin Staining: No Induration: No Mottled: No Localized Edema: No Pallor: No Rash: No Rubor:  No Scarring: No Moisture No Abnormalities Noted: No Dry / Scaly: No Maceration: No Moist: No Wound Preparation Ulcer Cleansing: Rinsed/Irrigated with Saline Topical Anesthetic Applied: None Treatment Notes Wound #17 (Left Hand - 4th Digit) 1. Cleansed with: Clean wound with Normal Saline 4. Dressing Applied: Other dressing (specify in notes) Notes betadine paint Electronic Signature(s) Signed: 07/18/2015 5:15:54 PM By: Montey Hora Entered By: Montey Hora on 07/18/2015 13:30:58 [name] ([id number]) -------------------------------------------------------------------------------- Wound Assessment Details Patient Name: [name] Date of Service: 07/18/2015 1:00 PM Medical Record Number: [id number] Patient Account Number: [id number] Date of Birth/Sex: 06/17/1966 (49 y.o. Female) Treating RN: Montey Hora Primary Care Physician: Arnette Norris Other Clinician: Referring Physician: Arnette Norris Treating Physician/Extender: Frann Rider in Treatment: 12 Wound Status Wound Number: 19 Primary Trauma, Other Etiology: Wound Location: Right Hand - 4th Digit Wound Open Wounding Event: Trauma Status: Date Acquired: 06/27/2015 Comorbid Cataracts, Anemia, Type II Diabetes, Weeks Of Treatment: 3 History: End Stage Renal Disease, History of Clustered Wound: No Burn, Neuropathy Photos Photo Uploaded By: Montey Hora on 07/18/2015 16:12:12 Wound Measurements Length: (cm) 1.5 Width: (cm) 1.5 Depth: (cm) 0.1 Area: (cm) 1.767 Volume: (cm) 0.177 % Reduction in Area: -181.4% % Reduction in Volume: -181% Epithelialization: None Tunneling: No Undermining: No

## 2015-07-29 ENCOUNTER — Ambulatory Visit: Payer: Medicare Other | Admitting: Infectious Disease

## 2015-08-01 ENCOUNTER — Encounter: Payer: Medicare Other | Attending: Surgery | Admitting: Surgery

## 2015-08-01 DIAGNOSIS — E1140 Type 2 diabetes mellitus with diabetic neuropathy, unspecified: Secondary | ICD-10-CM | POA: Insufficient documentation

## 2015-08-01 DIAGNOSIS — L89310 Pressure ulcer of right buttock, unstageable: Secondary | ICD-10-CM | POA: Insufficient documentation

## 2015-08-01 DIAGNOSIS — N186 End stage renal disease: Secondary | ICD-10-CM | POA: Diagnosis not present

## 2015-08-01 DIAGNOSIS — G4733 Obstructive sleep apnea (adult) (pediatric): Secondary | ICD-10-CM | POA: Insufficient documentation

## 2015-08-01 DIAGNOSIS — S61301A Unspecified open wound of left index finger with damage to nail, initial encounter: Secondary | ICD-10-CM | POA: Diagnosis not present

## 2015-08-01 DIAGNOSIS — S61303A Unspecified open wound of left middle finger with damage to nail, initial encounter: Secondary | ICD-10-CM | POA: Insufficient documentation

## 2015-08-01 DIAGNOSIS — S61300A Unspecified open wound of right index finger with damage to nail, initial encounter: Secondary | ICD-10-CM | POA: Diagnosis not present

## 2015-08-01 DIAGNOSIS — X58XXXA Exposure to other specified factors, initial encounter: Secondary | ICD-10-CM | POA: Insufficient documentation

## 2015-08-01 DIAGNOSIS — S61307A Unspecified open wound of left little finger with damage to nail, initial encounter: Secondary | ICD-10-CM | POA: Diagnosis not present

## 2015-08-01 DIAGNOSIS — Z992 Dependence on renal dialysis: Secondary | ICD-10-CM | POA: Insufficient documentation

## 2015-08-01 DIAGNOSIS — E1122 Type 2 diabetes mellitus with diabetic chronic kidney disease: Secondary | ICD-10-CM | POA: Diagnosis not present

## 2015-08-01 DIAGNOSIS — Z794 Long term (current) use of insulin: Secondary | ICD-10-CM | POA: Insufficient documentation

## 2015-08-01 DIAGNOSIS — I2510 Atherosclerotic heart disease of native coronary artery without angina pectoris: Secondary | ICD-10-CM | POA: Diagnosis not present

## 2015-08-07 NOTE — Progress Notes (Signed)
JANIQUA, CABAZOS ([id number]) Visit Report for 08/01/2015 Chief Complaint Document Details Patient Name: [name], [name] Date of Service: 08/01/2015 11:00 AM Medical Record Number: [id number] Patient Account Number: [id number] Date of Birth/Sex: Mar 07, 1966 (49 y.o. Female) Treating RN: Cornell Barman Primary Care Physician: Arnette Norris Other Clinician: Referring Physician: Arnette Norris Treating Physician/Extender: Frann Rider in Treatment: 14 Information Obtained from: Patient Chief Complaint Patient presents to the wound care center for a consult due non healing wound The patient has one area on her right gluteal region and several fingers involved with ulcerations. This has been there for about 3 months. Electronic Signature(s) Signed: 08/01/2015 11:44:56 AM By: [name] MD, FACS Entered By: [name] on 08/01/2015 11:44:56 MARKA, NOTCH ([id number]) -------------------------------------------------------------------------------- HPI Details Patient Name: [name] Date of Service: 08/01/2015 11:00 AM Medical Record Number: [id number] Patient Account Number: [id number] Date of Birth/Sex: 1966/05/05 (49 y.o. Female) Treating RN: Cornell Barman Primary Care Physician: Arnette Norris Other Clinician: Referring Physician: Arnette Norris Treating Physician/Extender: Frann Rider in Treatment: 14 History of Present Illness Location: right gluteal area and 3 fingers on her right hand and 4 fingers on her left hand involved with ulceration. Quality: Patient reports No Pain. Severity: Patient states wound are getting worse. Duration: Patient has had the wound for > 3 months prior to seeking treatment at the wound center Context: The wound appeared gradually over time Modifying Factors: Patient is currently on renal dialysis and receives treatments 3 times weekly HPI Description: Pleasant 49 year old patient who is known to the wound clinic from previous  visits. She has significant diabetic neuropathy and has no sensation in her hands and feet. She is wheelchair-bound and takes dialysis on a regular basis 3 times a week. 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 Past medical history significant for diabetes mellitus,end-stage renal disease on hemodialysis, coronary artery disease, multiple amputations, sleep apnea, wheelchair bound due to severe peripheral neuropathy. She has now declined quite significantly and is wheelchair-bound all the time. She has developed decubitus ulcers of the skin and has been referred to [place] by her PCP. She was also recently seen by Dr. Celesta Gentile for her feet and he noticed that she had a ulceration of the right third toe and put her on Levaquin and local care. She says she is laying down in bed much more now and has been trying to offload as much as possible. Due to a psychiatric issue she has been biting her fingernails and has caused much ulceration to both hands. Part of it may be related to the medication she was on. 05/14/2015 -- she says she is very sore around her anus and the vagina and has a lot of redness and problems there. I have reviewed her x-rays which were done of both hands and they show: X-ray of the left hand shows no acute fracture, dislocation or convincing osteomyelitis. X-ray of the right hand shows deformities of the distal phalanx of the first second and third digits with peripheral vascular disease but no evidence of acute fracture dislocation or osteomyelitis. 05/28/2015 -- she has seen the dermatologist and will also be seeing the infectious disease doctor soon and is on a proper regimen for her multiple ailments. 06/27/2015 -- she has been wearing a splint on her left hand and last week she had a blister and bleb on the left middle  finger which was  debrided by the burn center at [facility]. Since then she's been having a lot of pain and swelling. She has been worse for the last 3-4 days. 07/11/2015 - she was admitted to [facility] for a badly infected left middle finger infection with sepsis and was admitted between 06/27/2015 to 07/03/2015. She was discharged home on vancomycin and supportive care. during her hospital stay infectious disease treated her with vancomycin and Cipro and an incision and drainage was done of the left third digit. The procedure was done on 06/27/2015 and consisted of an SRITHA, SNYDER. ([id number]) amputation of the tip of the left long finger at the middle phalanx level irrigation and debridement of full- thickness skin and subcutaneous tissue and tendon.An MRI of the right foot showed no evidence of osteomyelitis. 07/18/2015 -- the patient was reviewed by her orthopedic surgeon Dr. Lenon Curt on noted that since the flexor tendon of the left middle finger was necrotic and was removed that she will have a problem with flexing of her middle finger. He also saw her right thumb was fractured but did not recommend any specific therapy. He asked her to continue with the wound center. Electronic Signature(s) Signed: 08/01/2015 11:45:02 AM By: [name] MD, FACS Entered By: [name] on 08/01/2015 11:45:01 JUDENE, GAHN ([id number]) -------------------------------------------------------------------------------- Physical Exam Details Patient Name: [name] Date of Service: 08/01/2015 11:00 AM Medical Record Number: [id number] Patient Account Number: [id number] Date of Birth/Sex: [birth year] (49 y.o. Female) Treating RN: Cornell Barman Primary Care Physician: Arnette Norris Other Clinician: Referring Physician: Arnette Norris Treating Physician/Extender: Frann Rider in Treatment: 14 Constitutional . Pulse regular. Respirations normal and unlabored. Afebrile. . Eyes Nonicteric. Reactive to  light. Ears, Nose, Mouth, and Throat Lips, teeth, and gums WNL.Marland Kitchen Moist mucosa without lesions . Neck supple and nontender. No palpable supraclavicular or cervical adenopathy. Normal sized without goiter. Respiratory WNL. No retractions.. Cardiovascular Pedal Pulses WNL. No clubbing, cyanosis or edema. Lymphatic No adneopathy. No adenopathy. No adenopathy. Musculoskeletal Adexa without tenderness or enlargement.. Digits and nails w/o clubbing, cyanosis, infection, petechiae, ischemia, or inflammatory conditions.. Integumentary (Hair, Skin) No suspicious lesions. No crepitus or fluctuance. No peri-wound warmth or erythema. No masses.Marland Kitchen Psychiatric Judgement and insight Intact.. No evidence of depression, anxiety, or agitation.. Notes the left hand middle finger surgical scar is completely healed. The palm of the hand has a dry scar and there is no drainage. The rest of the wounds on the right fingers are dry and there is no drainage. Electronic Signature(s) Signed: 08/01/2015 11:45:44 AM By: [name] MD, FACS Entered By: [name] on 08/01/2015 11:45:43 NOEMIE, STUFFLE ([id number]) -------------------------------------------------------------------------------- Physician Orders Details Patient Name: [name] Date of Service: 08/01/2015 11:00 AM Medical Record Number: [id number] Patient Account Number: [id number] Date of Birth/Sex: 1966-01-22 (49 y.o. Female) Treating RN: Ahmed Prima Primary Care Physician: Arnette Norris Other Clinician: Referring Physician: Arnette Norris Treating Physician/Extender: Frann Rider in Treatment: 74 Verbal / Phone Orders: Yes Clinician: Carolyne Fiscal, Debi Read Back and Verified: Yes Diagnosis Coding Wound Cleansing Wound #12 Right Hand - 1st Digit o Clean wound with Normal Saline. Wound #14 Left Hand - 1st Digit o Clean wound with Normal Saline. Wound #15 Left Hand - 2nd Digit o Clean wound with Normal  Saline. Wound #17 Left Hand - 4th Digit o Clean wound with Normal Saline. Wound #19 Right Hand - 4th Digit o Clean wound with Normal Saline. Wound #20 Right Hand - 5th Digit o  Clean wound with Normal Saline. Wound #21 Left Amputation Site - Digit o Clean wound with Normal Saline. Wound #22 Left Hand - Palm o Clean wound with Normal Saline. Primary Wound Dressing Wound #12 Right Hand - 1st Digit o Other: - betadine Wound #14 Left Hand - 1st Digit o Other: - betadine Wound #15 Left Hand - 2nd Digit o Other: - betadine Wound #17 Left Hand - 4th Digit o Other: - betadine Cristo, Leanore R. ([id number]) Wound #19 Right Hand - 4th Digit o Aquacel Ag Wound #20 Right Hand - 5th Digit o Other: - betadine Wound #21 Left Amputation Site - Digit o Other: - betadine Wound #22 Left Hand - Palm o Other: - bandaid Dressing Change Frequency Wound #12 Right Hand - 1st Digit o Other: - paint wounds daily with Betadine Wound #14 Left Hand - 1st Digit o Other: - paint wounds daily with Betadine Wound #15 Left Hand - 2nd Digit o Other: - paint wounds daily with Betadine Wound #17 Left Hand - 4th Digit o Other: - paint wounds daily with Betadine Wound #19 Right Hand - 4th Digit o Other: - paint wounds daily with Betadine Wound #20 Right Hand - 5th Digit o Other: - paint wounds daily with Betadine Wound #21 Left Amputation Site - Digit o Other: - paint wounds daily with Betadine Wound #22 Left Hand - Palm o Other: - paint wounds daily with Betadine o Change dressing every day. Follow-up Appointments Wound #12 Right Hand - 1st Digit o Return Appointment in 2 weeks. Wound #14 Left Hand - 1st Digit o Return Appointment in 2 weeks. Wound #15 Left Hand - 2nd Digit o Return Appointment in 2 weeks. YEMAYA, GURULE ([id number]) Wound #17 Left Hand - 4th Digit o Return Appointment in 2 weeks. Wound #19 Right Hand - 4th Digit o Return  Appointment in 2 weeks. Wound #20 Right Hand - 5th Digit o Return Appointment in 2 weeks. Wound #21 Left Amputation Site - Digit o Return Appointment in 2 weeks. Wound #22 Left Hand - Palm o Return Appointment in 2 weeks. Electronic Signature(s) Signed: 08/01/2015 4:31:17 PM By: [name] MD, FACS Signed: 08/06/2015 6:00:56 PM By: Alric Quan Entered By: Alric Quan on 08/01/2015 11:44:02 ARINE, LUKS ([id number]) -------------------------------------------------------------------------------- Problem List Details Patient Name: [name] Date of Service: 08/01/2015 11:00 AM Medical Record Number: [id number] Patient Account Number: [id number] Date of Birth/Sex: 05/03/1966 (49 y.o. Female) Treating RN: Cornell Barman Primary Care Physician: Arnette Norris Other Clinician: Referring Physician: Arnette Norris Treating Physician/Extender: Frann Rider in Treatment: 14 Active Problems ICD-10 Encounter Code Description Active Date Diagnosis E11.40 Type 2 diabetes mellitus with diabetic neuropathy, 04/23/2015 Yes unspecified Z99.2 Dependence on renal dialysis 04/23/2015 Yes L89.310 Pressure ulcer of right buttock, unstageable 04/23/2015 Yes S61.300A Unspecified open wound of right index finger with damage 04/23/2015 Yes to nail, initial encounter S61.301A Unspecified open wound of left index finger with damage 04/23/2015 Yes to nail, initial encounter S61.307A Unspecified open wound of left little finger with damage to 04/23/2015 Yes nail, initial encounter S61.303A Unspecified open wound of left middle finger with damage 04/23/2015 Yes to nail, initial encounter Inactive Problems Resolved Problems Electronic Signature(s) Signed: 08/01/2015 11:44:48 AM By: [name] MD, FACS Entered By: [name] on 08/01/2015 11:44:48 [name] ([id number] [name]  ([id number]) -------------------------------------------------------------------------------- Progress Note Details Patient Name: [name] Date of Service: 08/01/2015 11:00 AM Medical Record Number: [id number] Patient Account Number: [id number] Date of Birth/Sex: August 16, 1966 (  49 y.o. Female) Treating RN: Cornell Barman Primary Care Physician: Arnette Norris Other Clinician: Referring Physician: Arnette Norris Treating Physician/Extender: Frann Rider in Treatment: 14 Subjective Chief Complaint Information obtained from Patient Patient presents to the wound care center for a consult due non healing wound The patient has one area on her right gluteal region and several fingers involved with ulcerations. This has been there for about 3 months. History of Present Illness (HPI) The following HPI elements were documented for the patient's wound: Location: right gluteal area and 3 fingers on her right hand and 4 fingers on her left hand involved with ulceration. Quality: Patient reports No Pain. Severity: Patient states wound are getting worse. Duration: Patient has had the wound for > 3 months prior to seeking treatment at the wound center Context: The wound appeared gradually over time Modifying Factors: Patient is currently on renal dialysis and receives treatments 3 times weekly Pleasant 49 year old patient who is known to the wound clinic from previous visits. She has significant diabetic neuropathy and has no sensation in her hands and feet. She is wheelchair-bound and takes dialysis on a regular basis 3 times a week. 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 Past medical history significant for diabetes mellitus,end-stage renal disease on hemodialysis, coronary artery disease, multiple amputations, sleep apnea, wheelchair bound due to severe peripheral  neuropathy. She has now declined quite significantly and is wheelchair-bound all the time. She has developed decubitus ulcers of the skin and has been referred to [place] by her PCP. She was also recently seen by Dr. Celesta Gentile for her feet and he noticed that she had a ulceration of the right third toe and put her on Levaquin and local care. She says she is laying down in bed much more now and has been trying to offload as much as possible. Due to a psychiatric issue she has been biting her fingernails and has caused much ulceration to both hands. Part of it may be related to the medication she was on. 05/14/2015 -- she says she is very sore around her anus and the vagina and has a lot of redness and problems there. I have reviewed her x-rays which were done of both hands and they show: X-ray of the left hand shows no acute fracture, dislocation or convincing osteomyelitis. X-ray of the right hand shows deformities of the distal phalanx of the first second and third digits with peripheral vascular disease but no evidence of acute fracture dislocation or osteomyelitis. EVANJELINA, OCKERMAN ([id number]) 05/28/2015 -- she has seen the dermatologist and will also be seeing the infectious disease doctor soon and is on a proper regimen for her multiple ailments. 06/27/2015 -- she has been wearing a splint on her left hand and last week she had a blister and bleb on the left middle finger which was debrided by the burn center at [facility]. Since then she's been having a lot of pain and swelling. She has been worse for the last 3-4 days. 07/11/2015 - she was admitted to [facility] for a badly infected left middle finger infection with sepsis and was admitted between 06/27/2015 to 07/03/2015. She was discharged home on vancomycin and supportive care. during her hospital stay infectious disease treated her with vancomycin and Cipro and an incision and drainage was done of the left third digit. The  procedure was done  on 06/27/2015 and consisted of an amputation of the tip of the left long finger at the middle phalanx level irrigation and debridement of full- thickness skin and subcutaneous tissue and tendon.An MRI of the right foot showed no evidence of osteomyelitis. 07/18/2015 -- the patient was reviewed by her orthopedic surgeon Dr. Lenon Curt on noted that since the flexor tendon of the left middle finger was necrotic and was removed that she will have a problem with flexing of her middle finger. He also saw her right thumb was fractured but did not recommend any specific therapy. He asked her to continue with the wound center. Objective Constitutional Pulse regular. Respirations normal and unlabored. Afebrile. Vitals Time Taken: 11:07 AM, Height: 68 in, Weight: 220 lbs, BMI: 33.4, Temperature: 97.7 F, Pulse: 60 bpm, Respiratory Rate: 20 breaths/min, Blood Pressure: 80/42 mmHg. Eyes Nonicteric. Reactive to light. Ears, Nose, Mouth, and Throat Lips, teeth, and gums WNL.Marland Kitchen Moist mucosa without lesions . Neck supple and nontender. No palpable supraclavicular or cervical adenopathy. Normal sized without goiter. Respiratory WNL. No retractions.. Cardiovascular Pedal Pulses WNL. No clubbing, cyanosis or edema. Lymphatic No adneopathy. No adenopathy. No adenopathy. FATINA, SAMBORSKI R. ([id number]) Musculoskeletal Adexa without tenderness or enlargement.. Digits and nails w/o clubbing, cyanosis, infection, petechiae, ischemia, or inflammatory conditions.Marland Kitchen Psychiatric Judgement and insight Intact.. No evidence of depression, anxiety, or agitation.. General Notes: the left hand middle finger surgical scar is completely healed. The palm of the hand has a dry scar and there is no drainage. The rest of the wounds on the right fingers are dry and there is no drainage. Integumentary (Hair, Skin) No suspicious lesions. No crepitus or fluctuance. No peri-wound warmth or erythema. No  masses.. Wound #12 status is Open. Original cause of wound was Trauma. The wound is located on the Right Hand - 1st Digit. The wound measures 1cm length x 1.5cm width x 0.1cm depth; 1.178cm^2 area and 0.118cm^3 volume. The wound is limited to skin breakdown. There is no tunneling or undermining noted. There is a none present amount of drainage noted. The wound margin is flat and intact. There is medium (34-66%) red granulation within the wound bed. There is a medium (34-66%) amount of necrotic tissue within the wound bed including Eschar. The periwound skin appearance exhibited: Dry/Scaly. The periwound skin appearance did not exhibit: Callus, Crepitus, Excoriation, Fluctuance, Friable, Induration, Localized Edema, Rash, Scarring, Maceration, Moist, Atrophie Blanche, Cyanosis, Ecchymosis, Hemosiderin Staining, Mottled, Pallor, Rubor, Erythema. Periwound temperature was noted as No Abnormality. Wound #14 status is Open. Original cause of wound was Trauma. The wound is located on the Left Hand - 1st Digit. The wound measures 0.4cm length x 1cm width x 0.1cm depth; 0.314cm^2 area and 0.031cm^3 volume. The wound is limited to skin breakdown. There is no tunneling or undermining noted. There is a none present amount of drainage noted. The wound margin is flat and intact. There is medium (34-66%) red granulation within the wound bed. There is a medium (34-66%) amount of necrotic tissue within the wound bed including Eschar. The periwound skin appearance exhibited: Dry/Scaly. The periwound skin appearance did not exhibit: Callus, Crepitus, Excoriation, Fluctuance, Friable, Induration, Localized Edema, Rash, Scarring, Maceration, Moist, Atrophie Blanche, Cyanosis, Ecchymosis, Hemosiderin Staining, Mottled, Pallor, Rubor, Erythema. Periwound temperature was noted as No Abnormality. Wound #15 status is Open. Original cause of wound was Trauma. The wound is located on the Left Hand - 2nd Digit. The wound  measures 0.3cm length x 1cm width x 0.1cm depth; 0.236cm^2 area and 0.024cm^3 volume.  The wound is limited to skin breakdown. There is no tunneling or undermining noted. There is a none present amount of drainage noted. The wound margin is distinct with the outline attached to the wound base. There is medium (34-66%) red granulation within the wound bed. There is a medium (34-66%) amount of necrotic tissue within the wound bed including Eschar. The periwound skin appearance exhibited: Dry/Scaly. The periwound skin appearance did not exhibit: Callus, Crepitus, Excoriation, Fluctuance, Friable, Induration, Localized Edema, Rash, Scarring, Maceration, Moist, Atrophie Blanche, Cyanosis, Ecchymosis, Hemosiderin Staining, Mottled, Pallor, Rubor, Erythema. Periwound temperature was noted as No Abnormality. Wound #17 status is Open. Original cause of wound was Trauma. The wound is located on the Left Hand - 4th Digit. The wound measures 0.3cm length x 0.6cm width x 0.1cm depth; 0.141cm^2 area and 0.014cm^3 volume. The wound is limited to skin breakdown. There is no tunneling or undermining noted. There is a small amount of serous drainage noted. The wound margin is flat and intact. There is medium (34-66%) red Jacobson, Brandie R. ([id number]) granulation within the wound bed. There is a medium (34-66%) amount of necrotic tissue within the wound bed including Eschar. The periwound skin appearance did not exhibit: Callus, Crepitus, Excoriation, Fluctuance, Friable, Induration, Localized Edema, Rash, Scarring, Dry/Scaly, Maceration, Moist, Atrophie Blanche, Cyanosis, Ecchymosis, Hemosiderin Staining, Mottled, Pallor, Rubor, Erythema. Wound #19 status is Open. Original cause of wound was Trauma. The wound is located on the Right Hand - 4th Digit. The wound measures 2cm length x 1.5cm width x 0.1cm depth; 2.356cm^2 area and 0.236cm^3 volume. The wound is limited to skin breakdown. There is no tunneling or  undermining noted. There is a none present amount of drainage noted. The wound margin is flat and intact. There is medium (34-66%) red granulation within the wound bed. There is a medium (34-66%) amount of necrotic tissue within the wound bed including Eschar. The periwound skin appearance did not exhibit: Callus, Crepitus, Excoriation, Fluctuance, Friable, Induration, Localized Edema, Rash, Scarring, Dry/Scaly, Maceration, Moist, Atrophie Blanche, Cyanosis, Ecchymosis, Hemosiderin Staining, Mottled, Pallor, Rubor, Erythema. Wound #20 status is Open. Original cause of wound was Trauma. The wound is located on the Right Hand - 5th Digit. The wound measures 0.2cm length x 0.2cm width x 0.1cm depth; 0.031cm^2 area and 0.003cm^3 volume. The wound is limited to skin breakdown. There is no tunneling or undermining noted. There is a none present amount of drainage noted. The wound margin is flat and intact. There is medium (34-66%) red granulation within the wound bed. There is a medium (34-66%) amount of necrotic tissue within the wound bed including Eschar. The periwound skin appearance did not exhibit: Callus, Crepitus, Excoriation, Fluctuance, Friable, Induration, Localized Edema, Rash, Scarring, Dry/Scaly, Maceration, Moist, Atrophie Blanche, Cyanosis, Ecchymosis, Hemosiderin Staining, Mottled, Pallor, Rubor, Erythema. Wound #21 status is Healed - Epithelialized. Original cause of wound was Surgical Injury. 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 The wound measures 0cm length x 0cm width x 0cm depth; 0cm^2 area and 0cm^3 volume. The wound is limited to skin breakdown. There is no tunneling or undermining noted. There is a none present amount of drainage noted. The wound margin is flat and intact. There is no granulation within the wound bed. There is no necrotic tissue within the wound bed. The periwound skin appearance had no abnormalities noted for moisture. The periwound skin  appearance had no abnormalities noted for color. The periwound skin appearance did not exhibit: Callus, Crepitus, Excoriation, Fluctuance, Friable, Induration, Localized  Edema, Rash, Scarring. Periwound temperature was noted as No Abnormality. Wound #22 status is Open. Original cause of wound was Surgical Injury. The wound is located on the Left Hand - Palm. The wound measures 0.5cm length x 0.5cm width x 0.1cm depth; 0.196cm^2 area and 0.02cm^3 volume. The wound is limited to skin breakdown. There is no tunneling or undermining noted. There is a medium amount of serous drainage noted. The wound margin is flat and intact. There is large (67-100%) red granulation within the wound bed. There is no necrotic tissue within the wound bed. The periwound skin appearance did not exhibit: Callus, Crepitus, Excoriation, Fluctuance, Friable, Induration, Localized Edema, Rash, Scarring, Dry/Scaly, Maceration, Moist, Atrophie Blanche, Cyanosis, Ecchymosis, Hemosiderin Staining, Mottled, Pallor, Rubor, Erythema. 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 She will come back and see me in a week or two. Plan Wound Cleansing: Wound #12 Right Hand - 1st Digit: Clean wound with Normal Saline. Wound #14 Left  Hand - 1st Digit: Clean wound with Normal Saline. Wound #15 Left Hand - 2nd Digit: Clean wound with Normal Saline. Wound #17 Left Hand - 4th Digit: Clean wound with Normal Saline. Wound #19 Right Hand - 4th Digit: Clean wound with Normal Saline. Wound #20 Right Hand - 5th Digit: Clean wound with Normal Saline. 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 Wound #22 Left Hand - Palm: Clean wound with Normal Saline. Primary Wound Dressing: Wound #12 Right Hand - 1st Digit: Other: - betadine Wound #14 Left Hand - 1st Digit: Other: - betadine Wound #15 Left Hand - 2nd Digit: Other: - betadine Wound #17 Left Hand - 4th Digit: Other: - betadine Wound #19 Right Hand - 4th Digit: Aquacel Ag Wound #20 Right Hand - 5th Digit: Other: - betadine Wound #21 Left Amputation Site - DigitKIERSTAN, LAPSLEY. ([id number]) Other: - betadine Wound #22 Left Hand - Palm: Other: - bandaid Dressing Change Frequency: Wound #12 Right Hand - 1st Digit: Other: - paint wounds daily with Betadine Wound #14 Left Hand - 1st Digit: Other: - paint wounds daily with Betadine Wound #15 Left Hand - 2nd Digit: Other: - paint wounds daily with Betadine Wound #17 Left Hand - 4th Digit: Other: - paint wounds daily with Betadine Wound #19 Right Hand - 4th Digit: Other: - paint wounds daily with Betadine Wound #20 Right Hand - 5th Digit: Other: - paint wounds daily with Betadine Wound #21 Left Amputation Site - Digit: Other: - paint wounds daily with Betadine Wound #22 Left Hand - Palm: Other: - paint wounds daily with Betadine Change dressing every day. Follow-up Appointments: Wound #12 Right Hand - 1st Digit: Return Appointment in 2 weeks. Wound #14 Left Hand - 1st Digit: Return Appointment in 2 weeks. Wound #15 Left Hand - 2nd Digit: Return Appointment in 2 weeks. Wound #  17 Left Hand - 4th Digit: Return Appointment in 2 weeks. Wound #19 Right Hand - 4th Digit: Return  Appointment in 2 weeks. Wound #20 Right Hand - 5th Digit: Return Appointment in 2 weeks. 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 Wound #22 Left Hand - Palm: Return Appointment in 2 weeks. She has made a good improvement overall and I have recommended we paint the dry eschar with Betadine and try her best not to pick on the scabs. She will come back and see me in a week or two. Electronic Signature(s) ABYGAIL, GANGLOFF ([id number]) Signed: 08/01/2015 4:42:51 PM By: [name] MD, FACS Previous Signature: 08/01/2015 4:42:40 PM Version By: [name] MD, FACS Previous Signature: 08/01/2015 11:46:27 AM Version By: [name] MD, FACS Entered By: [name] on 08/01/2015 16:42:50 JAHLISA, OWENS ([id number]) -------------------------------------------------------------------------------- SuperBill Details Patient Name: [name] Date of Service: 08/01/2015 Medical Record Number: [id number] Patient Account Number: [id number] Date of Birth/Sex: 21-Jun-1966 (49 y.o. Female) Treating RN: Cornell Barman Primary Care Physician: Arnette Norris Other Clinician: Referring Physician: Arnette Norris Treating Physician/Extender: Frann Rider in Treatment: 14 Diagnosis Coding ICD-10 Codes Code Description E11.40 Type 2 diabetes mellitus with diabetic neuropathy, unspecified Z99.2 Dependence on renal dialysis L89.310 Pressure ulcer of right buttock, unstageable S61.300A Unspecified open wound of right index finger with damage to nail, initial encounter S61.301A Unspecified open wound of left index finger with damage to nail, initial encounter S61.307A Unspecified open wound of left little finger with damage to nail, initial encounter S61.303A Unspecified open wound of left middle finger with damage to nail, initial encounter Facility Procedures CPT4 Code: [id number] Description: [phone] - WOUND CARE VISIT-LEV 2 EST PT Modifier: Quantity:  1 Physician Procedures CPT4: Description Modifier Quantity Code [id number] - WC PHYS LEVEL 3 - EST PT 1 ICD-10 Description Diagnosis E11.40 Type 2 diabetes mellitus with diabetic neuropathy, unspecified S61.300A Unspecified open wound of right index finger with damage to  nail, initial encounter S61.301A Unspecified open wound of left index finger with damage to nail, initial encounter S61.307A Unspecified open wound of left little finger with damage to nail, initial encounter Electronic Signature(s) Signed: 08/01/2015 11:46:48 AM By: [name] MD, FACS Entered By: [name] on 08/01/2015 11:46:48

## 2015-08-07 NOTE — Progress Notes (Addendum)
Care Physician: Arnette Norris Other Clinician: Referring Physician: Arnette Norris Treating Physician/Extender: Frann Rider in Treatment: 14 Education Assessment Education Provided To: Patient Education Topics Provided Wound/Skin Impairment: Handouts: Other: change dressings as ordered Methods: Explain/Verbal Responses: State content correctly Electronic Signature(s) Signed: 08/06/2015 6:00:56 PM By: Alric Quan Entered By: Alric Quan on 08/01/2015 11:46:52 Jerome, Danton Sewer ([id number]) -------------------------------------------------------------------------------- Wound Assessment Details Patient Name: [name] Date of Service: 08/01/2015 11:00 AM Medical Record Number: [id number] Patient Account Number: [id number] Date of Birth/Sex: [birth year] (49 y.o. Female) Treating RN: Carolyne Fiscal, Debi Primary Care Physician: Arnette Norris Other Clinician: Referring Physician: Arnette Norris Treating Physician/Extender: Frann Rider in Treatment: 14 Wound Status Wound Number: 12 Primary Trauma, Other Etiology: Wound Location: Right Hand - 1st Digit Wound Open Wounding Event: Trauma Status: Date Acquired: 12/04/2014 Comorbid Cataracts, Anemia, Type II Diabetes, Weeks Of Treatment: 14 History: End Stage Renal Disease, History of Clustered Wound: No Burn, Neuropathy Photos Photo Uploaded By:  Alric Quan on 08/01/2015 17:09:10 Wound Measurements Length: (cm) 1 Width: (cm) 1.5 Depth: (cm) 0.1 Area: (cm) 1.178 Volume: (cm) 0.118 % Reduction in Area: 3.8% % Reduction in Volume: 4.1% Epithelialization: None Tunneling: No Undermining: No Wound Description Classification: Partial Thickness Wound Margin: Flat and Intact Exudate Amount: None Present Foul Odor After Cleansing: No Wound Bed Granulation Amount: Medium (34-66%) Exposed Structure Granulation Quality: Red Fascia Exposed: No Necrotic Amount: Medium (34-66%) Fat Layer Exposed: No Necrotic Quality: Eschar Tendon Exposed: No Muscle Exposed: No Joint Exposed: No Petsch, Tavonna R. ([id number]) Bone Exposed: No Limited to Skin Breakdown Periwound Skin Texture Texture Color No Abnormalities Noted: No No Abnormalities Noted: No Callus: No Atrophie Blanche: No Crepitus: No Cyanosis: No Excoriation: No Ecchymosis: No Fluctuance: No Erythema: No Friable: No Hemosiderin Staining: No Induration: No Mottled: No Localized Edema: No Pallor: No Rash: No Rubor: No Scarring: No Temperature / Pain Moisture Temperature: No Abnormality No Abnormalities Noted: No Dry / Scaly: Yes Maceration: No Moist: No Wound Preparation Ulcer Cleansing: Rinsed/Irrigated with Saline Topical Anesthetic Applied: None Treatment Notes Wound #12 (Right Hand - 1st Digit) 1. Cleansed with: Clean wound with Normal Saline 4. Dressing Applied: Other dressing (specify in notes) Notes bandaid to palm and betadine to other wounds Electronic Signature(s) Signed: 08/06/2015 6:00:56 PM By: Alric Quan Entered By: Alric Quan on 08/01/2015 11:27:01 [name] ([id number]) -------------------------------------------------------------------------------- Wound Assessment Details Patient Name: [name] Date of Service: 08/01/2015 11:00 AM Medical Record Number: [id number] Patient Account Number:  [id number] Date of Birth/Sex: [birth year] (49 y.o. Female) Treating RN: Carolyne Fiscal, Debi Primary Care Physician: Arnette Norris Other Clinician: Referring Physician: Arnette Norris Treating Physician/Extender: Frann Rider in Treatment: 14 Wound Status Wound Number: 14 Primary Trauma, Other Etiology: Wound Location: Left Hand - 1st Digit Wound Open Wounding Event: Trauma Status: Date Acquired: 12/04/2014 Comorbid Cataracts, Anemia, Type II Diabetes, Weeks Of Treatment: 14 History: End Stage Renal Disease, History of Clustered Wound: No Burn, Neuropathy Photos Photo Uploaded By: Alric Quan on 08/01/2015 17:12:37 Wound Measurements Length: (cm) 0.4 Width: (cm) 1 Depth: (cm) 0.1 Area: (cm) 0.314 Volume: (cm) 0.031 % Reduction in Area: 25.9% % Reduction in Volume: 26.2% Epithelialization: None Tunneling: No Undermining: No Wound Description Classification: Partial Thickness Wound Margin: Flat and Intact Exudate Amount: None Present Foul Odor After Cleansing: No Wound Bed Granulation Amount: Medium (34-66%) Exposed Structure Granulation Quality: Red Fascia Exposed: No Necrotic Amount: Medium (34-66%) Fat Layer Exposed: No Necrotic Quality: Eschar Tendon Exposed: No Muscle Exposed: No Joint Exposed: No Groninger, Sophronia  KESLEIGH, SVATEK ([id number]) Visit Report for 08/01/2015 Arrival Information Details Patient Name: SERENDIPITY, THACKERAY Date of Service: 08/01/2015 11:00 AM Medical Record Number: [id number] Patient Account Number: [id number] Date of Birth/Sex: [birth year] (49 y.o. Female) Treating RN: Ahmed Prima Primary Care Physician: Arnette Norris Other Clinician: Referring Physician: Arnette Norris Treating Physician/Extender: Frann Rider in Treatment: 14 Visit Information History Since Last Visit All ordered tests and consults were completed: No Patient Arrived: Wheel Chair Added or deleted any medications: No Arrival Time: 11:04 Any new allergies or adverse reactions: No Accompanied By: self Had a fall or experienced change in No activities of daily living that may affect Transfer Assistance: None risk of falls: Patient Identification Verified: Yes Signs or symptoms of abuse/neglect since last No Secondary Verification Process Yes visito Completed: Hospitalized since last visit: No Patient Requires Transmission-Based No Pain Present Now: No Precautions: Patient Has Alerts: Yes Patient Alerts: DMII Electronic Signature(s) Signed: 08/06/2015 6:00:56 PM By: Alric Quan Entered By: Alric Quan on 08/01/2015 11:06:50 [name] ([id number]) -------------------------------------------------------------------------------- Clinic Level of Care Assessment Details Patient Name: [name] Date of Service: 08/01/2015 11:00 AM Medical Record Number: [id number] Patient Account Number: [id number] Date of Birth/Sex: [birth year] (49 y.o. Female) Treating RN: Carolyne Fiscal, Debi Primary Care Physician: Arnette Norris Other Clinician: Referring Physician: Arnette Norris Treating Physician/Extender: Frann Rider in Treatment: 14 Clinic Level of Care Assessment Items TOOL 4 Quantity Score []  - Use when only an EandM is performed on FOLLOW-UP visit 0 ASSESSMENTS -  Nursing Assessment / Reassessment []  - Reassessment of Co-morbidities (includes updates in patient status) 0 []  - Reassessment of Adherence to Treatment Plan 0 ASSESSMENTS - Wound and Skin Assessment / Reassessment []  - Simple Wound Assessment / Reassessment - one wound 0 X - Complex Wound Assessment / Reassessment - multiple wounds 1 5 []  - Dermatologic / Skin Assessment (not related to wound area) 0 ASSESSMENTS - Focused Assessment []  - Circumferential Edema Measurements - multi extremities 0 []  - Nutritional Assessment / Counseling / Intervention 0 []  - Lower Extremity Assessment (monofilament, tuning fork, pulses) 0 []  - Peripheral Arterial Disease Assessment (using hand held doppler) 0 ASSESSMENTS - Ostomy and/or Continence Assessment and Care []  - Incontinence Assessment and Management 0 []  - Ostomy Care Assessment and Management (repouching, etc.) 0 PROCESS - Coordination of Care X - Simple Patient / Family Education for ongoing care 1 15 []  - Complex (extensive) Patient / Family Education for ongoing care 0 []  - Staff obtains Programmer, systems, Records, Test Results / Process Orders 0 []  - Staff telephones HHA, Nursing Homes / Clarify orders / etc 0 []  - Routine Transfer to another Facility (non-emergent condition) 0 Diers, Jiali R. ([id number]) []  - Routine Hospital Admission (non-emergent condition) 0 []  - New Admissions / Biomedical engineer / Ordering NPWT, Apligraf, etc. 0 []  - Emergency Hospital Admission (emergent condition) 0 X - Simple Discharge Coordination 1 10 []  - Complex (extensive) Discharge Coordination 0 PROCESS - Special Needs []  - Pediatric / Minor Patient Management 0 []  - Isolation Patient Management 0 []  - Hearing / Language / Visual special needs 0 []  - Assessment of Community assistance (transportation, D/C planning, etc.) 0 []  - Additional assistance / Altered mentation 0 []  - Support Surface(s) Assessment (bed, cushion, seat, etc.) 0 INTERVENTIONS -  Wound Cleansing / Measurement []  - Simple Wound Cleansing - one wound 0 X - Complex Wound Cleansing - multiple wounds 1 5 []  - Wound Imaging (photographs - any number of wounds) 0 []  -  KESLEIGH, SVATEK ([id number]) Visit Report for 08/01/2015 Arrival Information Details Patient Name: SERENDIPITY, THACKERAY Date of Service: 08/01/2015 11:00 AM Medical Record Number: [id number] Patient Account Number: [id number] Date of Birth/Sex: [birth year] (49 y.o. Female) Treating RN: Ahmed Prima Primary Care Physician: Arnette Norris Other Clinician: Referring Physician: Arnette Norris Treating Physician/Extender: Frann Rider in Treatment: 14 Visit Information History Since Last Visit All ordered tests and consults were completed: No Patient Arrived: Wheel Chair Added or deleted any medications: No Arrival Time: 11:04 Any new allergies or adverse reactions: No Accompanied By: self Had a fall or experienced change in No activities of daily living that may affect Transfer Assistance: None risk of falls: Patient Identification Verified: Yes Signs or symptoms of abuse/neglect since last No Secondary Verification Process Yes visito Completed: Hospitalized since last visit: No Patient Requires Transmission-Based No Pain Present Now: No Precautions: Patient Has Alerts: Yes Patient Alerts: DMII Electronic Signature(s) Signed: 08/06/2015 6:00:56 PM By: Alric Quan Entered By: Alric Quan on 08/01/2015 11:06:50 [name] ([id number]) -------------------------------------------------------------------------------- Clinic Level of Care Assessment Details Patient Name: [name] Date of Service: 08/01/2015 11:00 AM Medical Record Number: [id number] Patient Account Number: [id number] Date of Birth/Sex: [birth year] (49 y.o. Female) Treating RN: Carolyne Fiscal, Debi Primary Care Physician: Arnette Norris Other Clinician: Referring Physician: Arnette Norris Treating Physician/Extender: Frann Rider in Treatment: 14 Clinic Level of Care Assessment Items TOOL 4 Quantity Score []  - Use when only an EandM is performed on FOLLOW-UP visit 0 ASSESSMENTS -  Nursing Assessment / Reassessment []  - Reassessment of Co-morbidities (includes updates in patient status) 0 []  - Reassessment of Adherence to Treatment Plan 0 ASSESSMENTS - Wound and Skin Assessment / Reassessment []  - Simple Wound Assessment / Reassessment - one wound 0 X - Complex Wound Assessment / Reassessment - multiple wounds 1 5 []  - Dermatologic / Skin Assessment (not related to wound area) 0 ASSESSMENTS - Focused Assessment []  - Circumferential Edema Measurements - multi extremities 0 []  - Nutritional Assessment / Counseling / Intervention 0 []  - Lower Extremity Assessment (monofilament, tuning fork, pulses) 0 []  - Peripheral Arterial Disease Assessment (using hand held doppler) 0 ASSESSMENTS - Ostomy and/or Continence Assessment and Care []  - Incontinence Assessment and Management 0 []  - Ostomy Care Assessment and Management (repouching, etc.) 0 PROCESS - Coordination of Care X - Simple Patient / Family Education for ongoing care 1 15 []  - Complex (extensive) Patient / Family Education for ongoing care 0 []  - Staff obtains Programmer, systems, Records, Test Results / Process Orders 0 []  - Staff telephones HHA, Nursing Homes / Clarify orders / etc 0 []  - Routine Transfer to another Facility (non-emergent condition) 0 Diers, Jiali R. ([id number]) []  - Routine Hospital Admission (non-emergent condition) 0 []  - New Admissions / Biomedical engineer / Ordering NPWT, Apligraf, etc. 0 []  - Emergency Hospital Admission (emergent condition) 0 X - Simple Discharge Coordination 1 10 []  - Complex (extensive) Discharge Coordination 0 PROCESS - Special Needs []  - Pediatric / Minor Patient Management 0 []  - Isolation Patient Management 0 []  - Hearing / Language / Visual special needs 0 []  - Assessment of Community assistance (transportation, D/C planning, etc.) 0 []  - Additional assistance / Altered mentation 0 []  - Support Surface(s) Assessment (bed, cushion, seat, etc.) 0 INTERVENTIONS -  Wound Cleansing / Measurement []  - Simple Wound Cleansing - one wound 0 X - Complex Wound Cleansing - multiple wounds 1 5 []  - Wound Imaging (photographs - any number of wounds) 0 []  -  [id number]) Rash: No Rash: No Scarring: No Scarring: No Periwound Skin Maceration: No Maceration: No N/A Moisture: Moist: No Moist: No Dry/Scaly: No Dry/Scaly: No Periwound Skin Color: Erythema: Yes Atrophie Blanche: No N/A Atrophie Blanche: No Cyanosis: No Cyanosis: No Ecchymosis: No Ecchymosis: No Erythema: No Hemosiderin Staining:  No Hemosiderin Staining: No Mottled: No Mottled: No Pallor: No Pallor: No Rubor: No Rubor: No Erythema Location: Circumferential N/A N/A Temperature: N/A N/A N/A Tenderness on No No N/A Palpation: Wound Preparation: Ulcer Cleansing: Ulcer Cleansing: N/A Rinsed/Irrigated with Rinsed/Irrigated with Saline Saline Topical Anesthetic Topical Anesthetic Applied: None Applied: None Treatment Notes Electronic Signature(s) Signed: 08/06/2015 6:00:56 PM By: Alric Quan Entered By: Alric Quan on 08/01/2015 11:30:41 [name] ([id number]) -------------------------------------------------------------------------------- [place] Details Patient Name: [name] Date of Service: 08/01/2015 11:00 AM Medical Record Number: [id number] Patient Account Number: [id number] Date of Birth/Sex: 01-Dec-1965 (49 y.o. Female) Treating RN: Carolyne Fiscal, Debi Primary Care Physician: Arnette Norris Other Clinician: Referring Physician: Arnette Norris Treating Physician/Extender: Frann Rider in Treatment: 55 Active Inactive Abuse / Safety / Falls / Self Care Management Nursing Diagnoses: Impaired physical mobility Potential for falls Goals: Patient will remain injury free Date Initiated: 04/23/2015 Goal Status: Active Interventions: Assess fall risk on admission and as needed Notes: Orientation to the Wound Care Program Nursing Diagnoses: Knowledge deficit related to the wound healing center program Goals: Patient/caregiver will verbalize understanding of the [facility] Date Initiated: 04/23/2015 Goal Status: Active Interventions: Provide education on orientation to the wound center Notes: Peripheral Neuropathy Nursing Diagnoses: Potential alteration in peripheral tissue perfusion (select prior to confirmation of diagnosis) Goals: Patient/caregiver will verbalize understanding of disease process and disease  management BETTYJO, HOLLETT ([id number]) Date Initiated: 04/23/2015 Goal Status: Active Interventions: Assess signs and symptoms of neuropathy upon admission and as needed Notes: Pressure Nursing Diagnoses: Potential for impaired tissue integrity related to pressure, friction, moisture, and shear Goals: Patient will remain free from development of additional pressure ulcers Date Initiated: 04/23/2015 Goal Status: Active Interventions: Assess offloading mechanisms upon admission and as needed Notes: Wound/Skin Impairment Nursing Diagnoses: Impaired tissue integrity Goals: Ulcer/skin breakdown will have a volume reduction of 30% by week 4 Date Initiated: 04/23/2015 Goal Status: Active Ulcer/skin breakdown will have a volume reduction of 50% by week 8 Date Initiated: 04/23/2015 Goal Status: Active Ulcer/skin breakdown will have a volume reduction of 80% by week 12 Date Initiated: 04/23/2015 Goal Status: Active Ulcer/skin breakdown will heal within 14 weeks Date Initiated: 04/23/2015 Goal Status: Active Interventions: Assess patient/caregiver ability to obtain necessary supplies Assess ulceration(s) every visit Notes: REIA, CAHAN ([id number]) Electronic Signature(s) Signed: 08/06/2015 6:00:56 PM By: Alric Quan Entered By: Alric Quan on 08/01/2015 11:30:20 [name] ([id number]) -------------------------------------------------------------------------------- Pain Assessment Details Patient Name: [name] Date of Service: 08/01/2015 11:00 AM Medical Record Number: [id number] Patient Account Number: [id number] Date of Birth/Sex: [birth year] (49 y.o. Female) Treating RN: Ahmed Prima Primary Care Physician: Arnette Norris Other Clinician: Referring Physician: Arnette Norris Treating Physician/Extender: Frann Rider in Treatment: 14 Active Problems Location of Pain Severity and Description of Pain Patient Has Paino No Site  Locations Pain Management and Medication Current Pain Management: Electronic Signature(s) Signed: 08/06/2015 6:00:56 PM By: Alric Quan Entered By: Alric Quan on 08/01/2015 11:07:02 [name] ([id number]) -------------------------------------------------------------------------------- Patient/Caregiver Education Details Patient Name: [name] Date of Service: 08/01/2015 11:00 AM Medical Record Number: [id number] Patient Account Number: [id number] Date of Birth/Gender: May 27, 1966 (49 y.o. Female) Treating RN: Carolyne Fiscal, Debi Primary  [id number]) Rash: No Rash: No Scarring: No Scarring: No Periwound Skin Maceration: No Maceration: No N/A Moisture: Moist: No Moist: No Dry/Scaly: No Dry/Scaly: No Periwound Skin Color: Erythema: Yes Atrophie Blanche: No N/A Atrophie Blanche: No Cyanosis: No Cyanosis: No Ecchymosis: No Ecchymosis: No Erythema: No Hemosiderin Staining:  No Hemosiderin Staining: No Mottled: No Mottled: No Pallor: No Pallor: No Rubor: No Rubor: No Erythema Location: Circumferential N/A N/A Temperature: N/A N/A N/A Tenderness on No No N/A Palpation: Wound Preparation: Ulcer Cleansing: Ulcer Cleansing: N/A Rinsed/Irrigated with Rinsed/Irrigated with Saline Saline Topical Anesthetic Topical Anesthetic Applied: None Applied: None Treatment Notes Electronic Signature(s) Signed: 08/06/2015 6:00:56 PM By: Alric Quan Entered By: Alric Quan on 08/01/2015 11:30:41 [name] ([id number]) -------------------------------------------------------------------------------- [place] Details Patient Name: [name] Date of Service: 08/01/2015 11:00 AM Medical Record Number: [id number] Patient Account Number: [id number] Date of Birth/Sex: 01-Dec-1965 (49 y.o. Female) Treating RN: Carolyne Fiscal, Debi Primary Care Physician: Arnette Norris Other Clinician: Referring Physician: Arnette Norris Treating Physician/Extender: Frann Rider in Treatment: 55 Active Inactive Abuse / Safety / Falls / Self Care Management Nursing Diagnoses: Impaired physical mobility Potential for falls Goals: Patient will remain injury free Date Initiated: 04/23/2015 Goal Status: Active Interventions: Assess fall risk on admission and as needed Notes: Orientation to the Wound Care Program Nursing Diagnoses: Knowledge deficit related to the wound healing center program Goals: Patient/caregiver will verbalize understanding of the [facility] Date Initiated: 04/23/2015 Goal Status: Active Interventions: Provide education on orientation to the wound center Notes: Peripheral Neuropathy Nursing Diagnoses: Potential alteration in peripheral tissue perfusion (select prior to confirmation of diagnosis) Goals: Patient/caregiver will verbalize understanding of disease process and disease  management BETTYJO, HOLLETT ([id number]) Date Initiated: 04/23/2015 Goal Status: Active Interventions: Assess signs and symptoms of neuropathy upon admission and as needed Notes: Pressure Nursing Diagnoses: Potential for impaired tissue integrity related to pressure, friction, moisture, and shear Goals: Patient will remain free from development of additional pressure ulcers Date Initiated: 04/23/2015 Goal Status: Active Interventions: Assess offloading mechanisms upon admission and as needed Notes: Wound/Skin Impairment Nursing Diagnoses: Impaired tissue integrity Goals: Ulcer/skin breakdown will have a volume reduction of 30% by week 4 Date Initiated: 04/23/2015 Goal Status: Active Ulcer/skin breakdown will have a volume reduction of 50% by week 8 Date Initiated: 04/23/2015 Goal Status: Active Ulcer/skin breakdown will have a volume reduction of 80% by week 12 Date Initiated: 04/23/2015 Goal Status: Active Ulcer/skin breakdown will heal within 14 weeks Date Initiated: 04/23/2015 Goal Status: Active Interventions: Assess patient/caregiver ability to obtain necessary supplies Assess ulceration(s) every visit Notes: REIA, CAHAN ([id number]) Electronic Signature(s) Signed: 08/06/2015 6:00:56 PM By: Alric Quan Entered By: Alric Quan on 08/01/2015 11:30:20 [name] ([id number]) -------------------------------------------------------------------------------- Pain Assessment Details Patient Name: [name] Date of Service: 08/01/2015 11:00 AM Medical Record Number: [id number] Patient Account Number: [id number] Date of Birth/Sex: [birth year] (49 y.o. Female) Treating RN: Ahmed Prima Primary Care Physician: Arnette Norris Other Clinician: Referring Physician: Arnette Norris Treating Physician/Extender: Frann Rider in Treatment: 14 Active Problems Location of Pain Severity and Description of Pain Patient Has Paino No Site  Locations Pain Management and Medication Current Pain Management: Electronic Signature(s) Signed: 08/06/2015 6:00:56 PM By: Alric Quan Entered By: Alric Quan on 08/01/2015 11:07:02 [name] ([id number]) -------------------------------------------------------------------------------- Patient/Caregiver Education Details Patient Name: [name] Date of Service: 08/01/2015 11:00 AM Medical Record Number: [id number] Patient Account Number: [id number] Date of Birth/Gender: May 27, 1966 (49 y.o. Female) Treating RN: Carolyne Fiscal, Debi Primary  Wound Tracing (instead of photographs) 0 []  - Simple Wound Measurement - one wound 0 X - Complex Wound Measurement - multiple wounds 1 5 INTERVENTIONS - Wound Dressings X - Small Wound Dressing one or multiple wounds 1 10 []  - Medium Wound Dressing one or multiple wounds 0 []  - Large Wound Dressing one or multiple wounds 0 []  - Application of Medications - topical 0 []  - Application of Medications - injection 0 INTERVENTIONS - Miscellaneous []  - External ear exam 0 Viana, Shalese R. ([id number]) []  - Specimen Collection (cultures, biopsies, blood, body fluids, etc.) 0 []  - Specimen(s) / Culture(s) sent or taken to Lab for analysis 0 []  - Patient Transfer (multiple staff / Harrel Lemon Lift / Similar devices) 0 []  - Simple Staple / Suture removal (25 or less) 0 []  - Complex Staple / Suture removal (26 or more) 0 []  - Hypo / Hyperglycemic Management (close monitor of Blood Glucose) 0 []  - Ankle / Brachial Index (ABI) - do not check if billed separately 0 X - Vital Signs 1 5 Has the patient been seen at the hospital within the last three years: Yes Total Score: 55 Level Of Care: New/Established - Level 2 Electronic Signature(s) Signed: 08/06/2015 6:00:56 PM By: Alric Quan Entered By: Alric Quan on 08/01/2015 11:44:42 Perrow, Danton Sewer ([id number]) -------------------------------------------------------------------------------- Complex / Palliative Patient Assessment Details Patient Name: [name] Date of Service: 08/01/2015 11:00 AM Medical Record Number: [id number] Patient Account Number: [id number] Date of Birth/Sex: 1966/05/17 (49 y.o. Female) Treating RN: Cornell Barman Primary Care Physician: Arnette Norris Other Clinician: Referring Physician: Arnette Norris Treating  Physician/Extender: Frann Rider in Treatment: 14 Palliative Management Criteria Complex Wound Management Criteria Patient has remarkable or complex co-morbidities requiring medications or treatments that extend wound healing times. Examples: o Diabetes mellitus with chronic renal failure or end stage renal disease requiring dialysis o Advanced or poorly controlled rheumatoid arthritis o Diabetes mellitus and end stage chronic obstructive pulmonary disease o Active cancer with current chemo- or radiation therapy ESRD; DM Care Approach Wound Care Plan: Complex Wound Management Electronic Signature(s) Signed: 08/09/2015 4:18:47 PM By: [name] MD, FACS Signed: 08/09/2015 5:05:36 PM By: Gretta Cool, RN, BSN, Kim RN, BSN Entered By: Gretta Cool, RN, BSN, Kim on 08/07/2015 16:54:01 JALEIA, BERENGUER ([id number]) -------------------------------------------------------------------------------- Encounter Discharge Information Details Patient Name: [name] Date of Service: 08/01/2015 11:00 AM Medical Record Number: [id number] Patient Account Number: [id number] Date of Birth/Sex: 08-20-1966 (49 y.o. Female) Treating RN: Cornell Barman Primary Care Physician: Arnette Norris Other Clinician: Referring Physician: Arnette Norris Treating Physician/Extender: Frann Rider in Treatment: 14 Encounter Discharge Information Items Discharge Pain Level: 0 Discharge Condition: Stable Ambulatory Status: Wheelchair Discharge Destination: Home Transportation: Private Auto Accompanied By: self Schedule Follow-up Appointment: Yes Medication Reconciliation completed and provided to Patient/Care Yes Geanie Pacifico: Provided on Clinical Summary of Care: 08/01/2015 Form Type Recipient Paper Patient [facility] Electronic Signature(s) Signed: 08/06/2015 6:00:56 PM By: Alric Quan Previous Signature: 08/01/2015 11:41:03 AM Version By: Ruthine Dose Entered By: Alric Quan on 08/01/2015  11:46:25 [name] ([id number]) -------------------------------------------------------------------------------- Lower Extremity Assessment Details Patient Name: [name] Date of Service: 08/01/2015 11:00 AM Medical Record Number: [id number] Patient Account Number: [id number] Date of Birth/Sex: November 03, 1965 (49 y.o. Female) Treating RN: Ahmed Prima Primary Care Physician: Arnette Norris Other Clinician: Referring Physician: Arnette Norris Treating Physician/Extender: Frann Rider in Treatment: 14 Electronic Signature(s) Signed: 08/06/2015 6:00:56 PM By: Alric Quan Entered By: Alric Quan on 08/01/2015 11:26:15 Mcginnity, Tamakia R. ([id number]) -------------------------------------------------------------------------------- Multi Wound  [id number]) Rash: No Rash: No Scarring: No Scarring: No Periwound Skin Maceration: No Maceration: No N/A Moisture: Moist: No Moist: No Dry/Scaly: No Dry/Scaly: No Periwound Skin Color: Erythema: Yes Atrophie Blanche: No N/A Atrophie Blanche: No Cyanosis: No Cyanosis: No Ecchymosis: No Ecchymosis: No Erythema: No Hemosiderin Staining:  No Hemosiderin Staining: No Mottled: No Mottled: No Pallor: No Pallor: No Rubor: No Rubor: No Erythema Location: Circumferential N/A N/A Temperature: N/A N/A N/A Tenderness on No No N/A Palpation: Wound Preparation: Ulcer Cleansing: Ulcer Cleansing: N/A Rinsed/Irrigated with Rinsed/Irrigated with Saline Saline Topical Anesthetic Topical Anesthetic Applied: None Applied: None Treatment Notes Electronic Signature(s) Signed: 08/06/2015 6:00:56 PM By: Alric Quan Entered By: Alric Quan on 08/01/2015 11:30:41 [name] ([id number]) -------------------------------------------------------------------------------- [place] Details Patient Name: [name] Date of Service: 08/01/2015 11:00 AM Medical Record Number: [id number] Patient Account Number: [id number] Date of Birth/Sex: 01-Dec-1965 (49 y.o. Female) Treating RN: Carolyne Fiscal, Debi Primary Care Physician: Arnette Norris Other Clinician: Referring Physician: Arnette Norris Treating Physician/Extender: Frann Rider in Treatment: 55 Active Inactive Abuse / Safety / Falls / Self Care Management Nursing Diagnoses: Impaired physical mobility Potential for falls Goals: Patient will remain injury free Date Initiated: 04/23/2015 Goal Status: Active Interventions: Assess fall risk on admission and as needed Notes: Orientation to the Wound Care Program Nursing Diagnoses: Knowledge deficit related to the wound healing center program Goals: Patient/caregiver will verbalize understanding of the [place] [facility] Date Initiated: 04/23/2015 Goal Status: Active Interventions: Provide education on orientation to the wound center Notes: Peripheral Neuropathy Nursing Diagnoses: Potential alteration in peripheral tissue perfusion (select prior to confirmation of diagnosis) Goals: Patient/caregiver will verbalize understanding of disease process and disease  management BETTYJO, HOLLETT ([id number]) Date Initiated: 04/23/2015 Goal Status: Active Interventions: Assess signs and symptoms of neuropathy upon admission and as needed Notes: Pressure Nursing Diagnoses: Potential for impaired tissue integrity related to pressure, friction, moisture, and shear Goals: Patient will remain free from development of additional pressure ulcers Date Initiated: 04/23/2015 Goal Status: Active Interventions: Assess offloading mechanisms upon admission and as needed Notes: Wound/Skin Impairment Nursing Diagnoses: Impaired tissue integrity Goals: Ulcer/skin breakdown will have a volume reduction of 30% by week 4 Date Initiated: 04/23/2015 Goal Status: Active Ulcer/skin breakdown will have a volume reduction of 50% by week 8 Date Initiated: 04/23/2015 Goal Status: Active Ulcer/skin breakdown will have a volume reduction of 80% by week 12 Date Initiated: 04/23/2015 Goal Status: Active Ulcer/skin breakdown will heal within 14 weeks Date Initiated: 04/23/2015 Goal Status: Active Interventions: Assess patient/caregiver ability to obtain necessary supplies Assess ulceration(s) every visit Notes: REIA, CAHAN ([id number]) Electronic Signature(s) Signed: 08/06/2015 6:00:56 PM By: Alric Quan Entered By: Alric Quan on 08/01/2015 11:30:20 [name] ([id number]) -------------------------------------------------------------------------------- Pain Assessment Details Patient Name: [name] Date of Service: 08/01/2015 11:00 AM Medical Record Number: [id number] Patient Account Number: [id number] Date of Birth/Sex: [birth year] (49 y.o. Female) Treating RN: Ahmed Prima Primary Care Physician: Arnette Norris Other Clinician: Referring Physician: Arnette Norris Treating Physician/Extender: Frann Rider in Treatment: 14 Active Problems Location of Pain Severity and Description of Pain Patient Has Paino No Site  Locations Pain Management and Medication Current Pain Management: Electronic Signature(s) Signed: 08/06/2015 6:00:56 PM By: Alric Quan Entered By: Alric Quan on 08/01/2015 11:07:02 [name] ([id number]) -------------------------------------------------------------------------------- Patient/Caregiver Education Details Patient Name: [name] Date of Service: 08/01/2015 11:00 AM Medical Record Number: [id number] Patient Account Number: [id number] Date of Birth/Gender: May 27, 1966 (49 y.o. Female) Treating RN: Carolyne Fiscal, Debi Primary  Wound Tracing (instead of photographs) 0 []  - Simple Wound Measurement - one wound 0 X - Complex Wound Measurement - multiple wounds 1 5 INTERVENTIONS - Wound Dressings X - Small Wound Dressing one or multiple wounds 1 10 []  - Medium Wound Dressing one or multiple wounds 0 []  - Large Wound Dressing one or multiple wounds 0 []  - Application of Medications - topical 0 []  - Application of Medications - injection 0 INTERVENTIONS - Miscellaneous []  - External ear exam 0 Viana, Shalese R. ([id number]) []  - Specimen Collection (cultures, biopsies, blood, body fluids, etc.) 0 []  - Specimen(s) / Culture(s) sent or taken to Lab for analysis 0 []  - Patient Transfer (multiple staff / Harrel Lemon Lift / Similar devices) 0 []  - Simple Staple / Suture removal (25 or less) 0 []  - Complex Staple / Suture removal (26 or more) 0 []  - Hypo / Hyperglycemic Management (close monitor of Blood Glucose) 0 []  - Ankle / Brachial Index (ABI) - do not check if billed separately 0 X - Vital Signs 1 5 Has the patient been seen at the hospital within the last three years: Yes Total Score: 55 Level Of Care: New/Established - Level 2 Electronic Signature(s) Signed: 08/06/2015 6:00:56 PM By: Alric Quan Entered By: Alric Quan on 08/01/2015 11:44:42 Perrow, Danton Sewer ([id number]) -------------------------------------------------------------------------------- Complex / Palliative Patient Assessment Details Patient Name: [name] Date of Service: 08/01/2015 11:00 AM Medical Record Number: [id number] Patient Account Number: [id number] Date of Birth/Sex: 1966/05/17 (49 y.o. Female) Treating RN: Cornell Barman Primary Care Physician: Arnette Norris Other Clinician: Referring Physician: Arnette Norris Treating  Physician/Extender: Frann Rider in Treatment: 14 Palliative Management Criteria Complex Wound Management Criteria Patient has remarkable or complex co-morbidities requiring medications or treatments that extend wound healing times. Examples: o Diabetes mellitus with chronic renal failure or end stage renal disease requiring dialysis o Advanced or poorly controlled rheumatoid arthritis o Diabetes mellitus and end stage chronic obstructive pulmonary disease o Active cancer with current chemo- or radiation therapy ESRD; DM Care Approach Wound Care Plan: Complex Wound Management Electronic Signature(s) Signed: 08/09/2015 4:18:47 PM By: [name] MD, FACS Signed: 08/09/2015 5:05:36 PM By: Gretta Cool, RN, BSN, Kim RN, BSN Entered By: Gretta Cool, RN, BSN, Kim on 08/07/2015 16:54:01 JALEIA, BERENGUER ([id number]) -------------------------------------------------------------------------------- Encounter Discharge Information Details Patient Name: [name] Date of Service: 08/01/2015 11:00 AM Medical Record Number: [id number] Patient Account Number: [id number] Date of Birth/Sex: 08-20-1966 (49 y.o. Female) Treating RN: Cornell Barman Primary Care Physician: Arnette Norris Other Clinician: Referring Physician: Arnette Norris Treating Physician/Extender: Frann Rider in Treatment: 14 Encounter Discharge Information Items Discharge Pain Level: 0 Discharge Condition: Stable Ambulatory Status: Wheelchair Discharge Destination: Home Transportation: Private Auto Accompanied By: self Schedule Follow-up Appointment: Yes Medication Reconciliation completed and provided to Patient/Care Yes Geanie Pacifico: Provided on Clinical Summary of Care: 08/01/2015 Form Type Recipient Paper Patient [facility] Electronic Signature(s) Signed: 08/06/2015 6:00:56 PM By: Alric Quan Previous Signature: 08/01/2015 11:41:03 AM Version By: Ruthine Dose Entered By: Alric Quan on 08/01/2015  11:46:25 [name] ([id number]) -------------------------------------------------------------------------------- Lower Extremity Assessment Details Patient Name: [name] Date of Service: 08/01/2015 11:00 AM Medical Record Number: [id number] Patient Account Number: [id number] Date of Birth/Sex: November 03, 1965 (49 y.o. Female) Treating RN: Ahmed Prima Primary Care Physician: Arnette Norris Other Clinician: Referring Physician: Arnette Norris Treating Physician/Extender: Frann Rider in Treatment: 14 Electronic Signature(s) Signed: 08/06/2015 6:00:56 PM By: Alric Quan Entered By: Alric Quan on 08/01/2015 11:26:15 Mcginnity, Tamakia R. ([id number]) -------------------------------------------------------------------------------- Multi Wound  cm) 0.6 Depth: (cm) 0.1 Area: (cm)  0.141 Volume: (cm) 0.014 % Reduction in Area: 77.5% % Reduction in Volume: 77.8% Epithelialization: None Tunneling: No Undermining: No Wound Description Classification: Partial Thickness Wound Margin: Flat and Intact Exudate Amount: Small Exudate Type: Serous Exudate Color: amber Foul Odor After Cleansing: No Wound Bed Granulation Amount: Medium (34-66%) Exposed Structure Granulation Quality: Red Fascia Exposed: No Necrotic Amount: Medium (34-66%) Fat Layer Exposed: No Necrotic Quality: Eschar Tendon Exposed: No Ferre, Kaity R. ([id number]) Muscle Exposed: No Joint Exposed: No Bone Exposed: No Limited to Skin Breakdown Periwound Skin Texture Texture Color No Abnormalities Noted: No No Abnormalities Noted: No Callus: No Atrophie Blanche: No Crepitus: No Cyanosis: No Excoriation: No Ecchymosis: No Fluctuance: No Erythema: No Friable: No Hemosiderin Staining: No Induration: No Mottled: No Localized Edema: No Pallor: No Rash: No Rubor: No Scarring: No Moisture No Abnormalities Noted: No Dry / Scaly: No Maceration: No Moist: No Wound Preparation Ulcer Cleansing: Rinsed/Irrigated with Saline Topical Anesthetic Applied: None Treatment Notes Wound #17 (Left Hand - 4th Digit) 1. Cleansed with: Clean wound with Normal Saline 4. Dressing Applied: Other dressing (specify in notes) Notes bandaid to palm and betadine to other wounds Electronic Signature(s) Signed: 08/06/2015 6:00:56 PM By: Alric Quan Entered By: Alric Quan on 08/01/2015 11:28:10 [name] ([id number]) -------------------------------------------------------------------------------- Wound Assessment Details Patient Name: [name] Date of Service: 08/01/2015 11:00 AM Medical Record Number: [id number] Patient Account Number: [id number] Date of Birth/Sex: [birth year] (49 y.o. Female) Treating RN: Ahmed Prima Primary Care Physician: Arnette Norris Other  Clinician: Referring Physician: Arnette Norris Treating Physician/Extender: Frann Rider in Treatment: 14 Wound Status Wound Number: 19 Primary Trauma, Other Etiology: Wound Location: Right Hand - 4th Digit Wound Open Wounding Event: Trauma Status: Date Acquired: 06/27/2015 Comorbid Cataracts, Anemia, Type II Diabetes, Weeks Of Treatment: 5 History: End Stage Renal Disease, History of Clustered Wound: No Burn, Neuropathy Photos Photo Uploaded By: Alric Quan on 08/01/2015 17:10:40 Wound Measurements Length: (cm) 2 Width: (cm) 1.5 Depth: (cm) 0.1 Area: (cm) 2.356 Volume: (cm) 0.236 % Reduction in Area: -275.2% % Reduction in Volume: -274.6% Epithelialization: None Tunneling: No Undermining: No Wound Description Classification: Partial Thickness Wound Margin: Flat and Intact Exudate Amount: None Present Foul Odor After Cleansing: No Wound Bed Granulation Amount: Medium (34-66%) Exposed Structure Granulation Quality: Red Fascia Exposed: No Necrotic Amount: Medium (34-66%) Fat Layer Exposed: No Necrotic Quality: Eschar Tendon Exposed: No Muscle Exposed: No Joint Exposed: No St[place], Naveya R. ([id number]) Bone Exposed: No Limited to Skin Breakdown Periwound Skin Texture Texture Color No Abnormalities Noted: No No Abnormalities Noted: No Callus: No Atrophie Blanche: No Crepitus: No Cyanosis: No Excoriation: No Ecchymosis: No Fluctuance: No Erythema: No Friable: No Hemosiderin Staining: No Induration: No Mottled: No Localized Edema: No Pallor: No Rash: No Rubor: No Scarring: No Moisture No Abnormalities Noted: No Dry / Scaly: No Maceration: No Moist: No Wound Preparation Ulcer Cleansing: Rinsed/Irrigated with Saline Topical Anesthetic Applied: None Treatment Notes Wound #19 (Right Hand - 4th Digit) 1. Cleansed with: Clean wound with Normal Saline 4. Dressing Applied: Other dressing (specify in notes) Notes bandaid to palm and  betadine to other wounds Electronic Signature(s) Signed: 08/06/2015 6:00:56 PM By: Alric Quan Entered By: Alric Quan on 08/01/2015 11:29:15 [name] ([id number]) -------------------------------------------------------------------------------- Wound Assessment Details Patient Name: [name] Date of Service: 08/01/2015 11:00 AM Medical Record Number: [id number] Patient Account Number: [id number] Date of Birth/Sex: [birth year] (49 y.o. Female) Treating RN: Ahmed Prima Primary Care Physician: Arnette Norris  cm) 0.6 Depth: (cm) 0.1 Area: (cm)  0.141 Volume: (cm) 0.014 % Reduction in Area: 77.5% % Reduction in Volume: 77.8% Epithelialization: None Tunneling: No Undermining: No Wound Description Classification: Partial Thickness Wound Margin: Flat and Intact Exudate Amount: Small Exudate Type: Serous Exudate Color: amber Foul Odor After Cleansing: No Wound Bed Granulation Amount: Medium (34-66%) Exposed Structure Granulation Quality: Red Fascia Exposed: No Necrotic Amount: Medium (34-66%) Fat Layer Exposed: No Necrotic Quality: Eschar Tendon Exposed: No Ferre, Kaity R. ([id number]) Muscle Exposed: No Joint Exposed: No Bone Exposed: No Limited to Skin Breakdown Periwound Skin Texture Texture Color No Abnormalities Noted: No No Abnormalities Noted: No Callus: No Atrophie Blanche: No Crepitus: No Cyanosis: No Excoriation: No Ecchymosis: No Fluctuance: No Erythema: No Friable: No Hemosiderin Staining: No Induration: No Mottled: No Localized Edema: No Pallor: No Rash: No Rubor: No Scarring: No Moisture No Abnormalities Noted: No Dry / Scaly: No Maceration: No Moist: No Wound Preparation Ulcer Cleansing: Rinsed/Irrigated with Saline Topical Anesthetic Applied: None Treatment Notes Wound #17 (Left Hand - 4th Digit) 1. Cleansed with: Clean wound with Normal Saline 4. Dressing Applied: Other dressing (specify in notes) Notes bandaid to palm and betadine to other wounds Electronic Signature(s) Signed: 08/06/2015 6:00:56 PM By: Alric Quan Entered By: Alric Quan on 08/01/2015 11:28:10 [name] ([id number]) -------------------------------------------------------------------------------- Wound Assessment Details Patient Name: [name] Date of Service: 08/01/2015 11:00 AM Medical Record Number: [id number] Patient Account Number: [id number] Date of Birth/Sex: [birth year] (49 y.o. Female) Treating RN: Ahmed Prima Primary Care Physician: Arnette Norris Other  Clinician: Referring Physician: Arnette Norris Treating Physician/Extender: Frann Rider in Treatment: 14 Wound Status Wound Number: 19 Primary Trauma, Other Etiology: Wound Location: Right Hand - 4th Digit Wound Open Wounding Event: Trauma Status: Date Acquired: 06/27/2015 Comorbid Cataracts, Anemia, Type II Diabetes, Weeks Of Treatment: 5 History: End Stage Renal Disease, History of Clustered Wound: No Burn, Neuropathy Photos Photo Uploaded By: Alric Quan on 08/01/2015 17:10:40 Wound Measurements Length: (cm) 2 Width: (cm) 1.5 Depth: (cm) 0.1 Area: (cm) 2.356 Volume: (cm) 0.236 % Reduction in Area: -275.2% % Reduction in Volume: -274.6% Epithelialization: None Tunneling: No Undermining: No Wound Description Classification: Partial Thickness Wound Margin: Flat and Intact Exudate Amount: None Present Foul Odor After Cleansing: No Wound Bed Granulation Amount: Medium (34-66%) Exposed Structure Granulation Quality: Red Fascia Exposed: No Necrotic Amount: Medium (34-66%) Fat Layer Exposed: No Necrotic Quality: Eschar Tendon Exposed: No Muscle Exposed: No Joint Exposed: No St[place], Naveya R. ([id number]) Bone Exposed: No Limited to Skin Breakdown Periwound Skin Texture Texture Color No Abnormalities Noted: No No Abnormalities Noted: No Callus: No Atrophie Blanche: No Crepitus: No Cyanosis: No Excoriation: No Ecchymosis: No Fluctuance: No Erythema: No Friable: No Hemosiderin Staining: No Induration: No Mottled: No Localized Edema: No Pallor: No Rash: No Rubor: No Scarring: No Moisture No Abnormalities Noted: No Dry / Scaly: No Maceration: No Moist: No Wound Preparation Ulcer Cleansing: Rinsed/Irrigated with Saline Topical Anesthetic Applied: None Treatment Notes Wound #19 (Right Hand - 4th Digit) 1. Cleansed with: Clean wound with Normal Saline 4. Dressing Applied: Other dressing (specify in notes) Notes bandaid to palm and  betadine to other wounds Electronic Signature(s) Signed: 08/06/2015 6:00:56 PM By: Alric Quan Entered By: Alric Quan on 08/01/2015 11:29:15 [name] ([id number]) -------------------------------------------------------------------------------- Wound Assessment Details Patient Name: [name] Date of Service: 08/01/2015 11:00 AM Medical Record Number: [id number] Patient Account Number: [id number] Date of Birth/Sex: [birth year] (49 y.o. Female) Treating RN: Ahmed Prima Primary Care Physician: Arnette Norris

## 2015-08-15 ENCOUNTER — Ambulatory Visit (INDEPENDENT_AMBULATORY_CARE_PROVIDER_SITE_OTHER): Payer: Medicare Other | Admitting: Infectious Disease

## 2015-08-15 ENCOUNTER — Encounter: Payer: Self-pay | Admitting: Infectious Disease

## 2015-08-15 VITALS — BP 102/59 | HR 70 | Temp 97.6°F

## 2015-08-15 DIAGNOSIS — E0844 Diabetes mellitus due to underlying condition with diabetic amyotrophy: Secondary | ICD-10-CM | POA: Diagnosis not present

## 2015-08-15 DIAGNOSIS — M869 Osteomyelitis, unspecified: Secondary | ICD-10-CM

## 2015-08-15 DIAGNOSIS — L089 Local infection of the skin and subcutaneous tissue, unspecified: Secondary | ICD-10-CM

## 2015-08-15 DIAGNOSIS — B49 Unspecified mycosis: Secondary | ICD-10-CM | POA: Diagnosis present

## 2015-08-15 HISTORY — DX: Osteomyelitis, unspecified: M86.9
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
## 2015-08-22 ENCOUNTER — Ambulatory Visit: Payer: Medicare Other | Admitting: Surgery

## 2015-09-03 ENCOUNTER — Ambulatory Visit: Payer: Medicare Other

## 2015-09-05 ENCOUNTER — Encounter: Payer: Medicare Other | Attending: Surgery | Admitting: Surgery

## 2015-09-05 DIAGNOSIS — E1140 Type 2 diabetes mellitus with diabetic neuropathy, unspecified: Secondary | ICD-10-CM | POA: Insufficient documentation

## 2015-09-05 DIAGNOSIS — Z992 Dependence on renal dialysis: Secondary | ICD-10-CM | POA: Diagnosis not present

## 2015-09-05 DIAGNOSIS — S61301A Unspecified open wound of left index finger with damage to nail, initial encounter: Secondary | ICD-10-CM | POA: Diagnosis not present

## 2015-09-05 DIAGNOSIS — S61300A Unspecified open wound of right index finger with damage to nail, initial encounter: Secondary | ICD-10-CM | POA: Insufficient documentation

## 2015-09-05 DIAGNOSIS — S61307A Unspecified open wound of left little finger with damage to nail, initial encounter: Secondary | ICD-10-CM | POA: Insufficient documentation

## 2015-09-05 DIAGNOSIS — S61303A Unspecified open wound of left middle finger with damage to nail, initial encounter: Secondary | ICD-10-CM | POA: Diagnosis not present

## 2015-09-05 DIAGNOSIS — L89310 Pressure ulcer of right buttock, unstageable: Secondary | ICD-10-CM | POA: Diagnosis not present

## 2015-09-06 NOTE — Progress Notes (Signed)
Reduction in Area: 100.00% N/A N/A % Reduction in Volume: 100.00% N/A N/A Classification: Full Thickness Without N/A N/A Exposed Support Structures Exudate Amount: N/A N/A N/A Wound Margin: N/A N/A N/A Granulation Amount: N/A N/A N/A Necrotic Amount: N/A N/A N/A Necrotic Tissue: N/A N/A N/A Exposed Structures: N/A N/A N/A Epithelialization: N/A N/A N/A Periwound Skin Texture: No Abnormalities Noted N/A N/A Periwound Skin No Abnormalities Noted N/A N/A Moisture: Periwound Skin Color: No Abnormalities Noted N/A N/A Temperature: N/A N/A N/A Tenderness on No N/A N/A Palpation: Wound Preparation: N/A N/A N/A Treatment Notes Electronic Signature(s) Signed: 09/05/2015 5:56:46 PM By: Montey Hora Entered By: Montey Hora on 09/05/2015 11:00:16 [name]  ([id number]) -------------------------------------------------------------------------------- [place] Details Patient Name: [name] Date of Service: 09/05/2015 10:45 AM Medical Record Number: [id number] Patient Account Number: [id number] Date of Birth/Sex: Apr 06, 1966 (50 y.o. Female) Treating RN: Montey Hora Primary Care Physician: Arnette Norris Other Clinician: Referring Physician: Arnette Norris Treating Physician/Extender: Frann Rider in Treatment: 30 Active Inactive Abuse / Safety / Falls / Self Care Management Nursing Diagnoses: Impaired physical mobility Potential for falls Goals: Patient will remain injury free Date Initiated: 04/23/2015 Goal Status: Active Interventions: Assess fall risk on admission and as needed Notes: Orientation to the Wound Care Program Nursing Diagnoses: Knowledge deficit related to the wound healing center program Goals: Patient/caregiver will verbalize understanding of the [facility] Date Initiated: 04/23/2015 Goal Status: Active Interventions: Provide education on orientation to the wound center Notes: Peripheral Neuropathy Nursing Diagnoses: Potential alteration in peripheral tissue perfusion (select prior to confirmation of diagnosis) Goals: Patient/caregiver will verbalize understanding of disease process and disease management JAYLYNE, GRAB ([id number]) Date Initiated: 04/23/2015 Goal Status: Active Interventions: Assess signs and symptoms of neuropathy upon admission and as needed Notes: Pressure Nursing Diagnoses: Potential for impaired tissue integrity related to pressure, friction, moisture, and shear Goals: Patient will remain free from development of additional pressure ulcers Date Initiated: 04/23/2015 Goal Status: Active Interventions: Assess offloading mechanisms upon admission and as needed Notes: Wound/Skin Impairment Nursing Diagnoses: Impaired tissue  integrity Goals: Ulcer/skin breakdown will have a volume reduction of 30% by week 4 Date Initiated: 04/23/2015 Goal Status: Active Ulcer/skin breakdown will have a volume reduction of 50% by week 8 Date Initiated: 04/23/2015 Goal Status: Active Ulcer/skin breakdown will have a volume reduction of 80% by week 12 Date Initiated: 04/23/2015 Goal Status: Active Ulcer/skin breakdown will heal within 14 weeks Date Initiated: 04/23/2015 Goal Status: Active Interventions: Assess patient/caregiver ability to obtain necessary supplies Assess ulceration(s) every visit Notes: AURIANA, MCGUGAN ([id number]) Electronic Signature(s) Signed: 09/05/2015 5:56:46 PM By: Montey Hora Entered By: Montey Hora on 09/05/2015 10:58:54 [name] ([id number]) -------------------------------------------------------------------------------- Pain Assessment Details Patient Name: [name] Date of Service: 09/05/2015 10:45 AM Medical Record Number: [id number] Patient Account Number: [id number] Date of Birth/Sex: 04/02/1966 (50 y.o. Female) Treating RN: Montey Hora Primary Care Physician: Arnette Norris Other Clinician: Referring Physician: Arnette Norris Treating Physician/Extender: Frann Rider in Treatment: 85 Active Problems Location of Pain Severity and Description of Pain Patient Has Paino No Site Locations Pain Management and Medication Current Pain Management: Electronic Signature(s) Signed: 09/05/2015 5:56:46 PM By: Montey Hora Entered By: Montey Hora on 09/05/2015 10:51:41 [name] ([id number]) -------------------------------------------------------------------------------- Patient/Caregiver Education Details Patient Name: [name] Date of Service: 09/05/2015 10:45 AM Medical Record Number: [id number] Patient Account Number: [id number] Date of Birth/Gender: 07/23/1966 (50 y.o. Female) Treating RN: Montey Hora Primary Care Physician: Arnette Norris Other Clinician:  SUHAYLA, GASSAWAY ([id number]) Visit Report for 09/05/2015 Arrival Information Details Patient Name: [name] Date of Service: 09/05/2015 10:45 AM Medical Record Number: [id number] Patient Account Number: [id number] Date of Birth/Sex: [birth year] (50 y.o. Female) Treating RN: Montey Hora Primary Care Physician: Arnette Norris Other Clinician: Referring Physician: Arnette Norris Treating Physician/Extender: Frann Rider in Treatment: 54 Visit Information History Since Last Visit All ordered tests and consults were completed: No Patient Arrived: Wheel Chair Added or deleted any medications: No Arrival Time: 10:48 Any new allergies or adverse reactions: No Accompanied By: self Had a fall or experienced change in No activities of daily living that may affect Transfer Assistance: Other risk of falls: Patient Identification Verified: Yes Signs or symptoms of abuse/neglect since last No Secondary Verification Process Yes visito Completed: Hospitalized since last visit: No Patient Requires Transmission-Based No Pain Present Now: No Precautions: Patient Has Alerts: Yes Patient Alerts: DMII Electronic Signature(s) Signed: 09/05/2015 5:56:46 PM By: Montey Hora Entered By: Montey Hora on 09/05/2015 10:51:33 [name] ([id number]) -------------------------------------------------------------------------------- Clinic Level of Care Assessment Details Patient Name: [name] Date of Service: 09/05/2015 10:45 AM Medical Record Number: [id number] Patient Account Number: [id number] Date of Birth/Sex: 05-21-1966 (50 y.o. Female) Treating RN: Montey Hora Primary Care Physician: Arnette Norris Other Clinician: Referring Physician: Arnette Norris Treating Physician/Extender: Frann Rider in Treatment: 19 Clinic Level of Care Assessment Items TOOL 4 Quantity Score []  - Use when only an EandM is performed on FOLLOW-UP visit 0 ASSESSMENTS - Nursing  Assessment / Reassessment []  - Reassessment of Co-morbidities (includes updates in patient status) 0 X - Reassessment of Adherence to Treatment Plan 1 5 ASSESSMENTS - Wound and Skin Assessment / Reassessment []  - Simple Wound Assessment / Reassessment - one wound 0 X - Complex Wound Assessment / Reassessment - multiple wounds 2 5 []  - Dermatologic / Skin Assessment (not related to wound area) 0 ASSESSMENTS - Focused Assessment []  - Circumferential Edema Measurements - multi extremities 0 []  - Nutritional Assessment / Counseling / Intervention 0 []  - Lower Extremity Assessment (monofilament, tuning fork, pulses) 0 []  - Peripheral Arterial Disease Assessment (using hand held doppler) 0 ASSESSMENTS - Ostomy and/or Continence Assessment and Care []  - Incontinence Assessment and Management 0 []  - Ostomy Care Assessment and Management (repouching, etc.) 0 PROCESS - Coordination of Care X - Simple Patient / Family Education for ongoing care 1 15 []  - Complex (extensive) Patient / Family Education for ongoing care 0 X - Staff obtains Programmer, systems, Records, Test Results / Process Orders 1 10 []  - Staff telephones HHA, Nursing Homes / Clarify orders / etc 0 []  - Routine Transfer to another Facility (non-emergent condition) 0 MARYSSA, SOLLAZZO R. ([id number]) []  - Routine Hospital Admission (non-emergent condition) 0 []  - New Admissions / Biomedical engineer / Ordering NPWT, Apligraf, etc. 0 []  - Emergency Hospital Admission (emergent condition) 0 X - Simple Discharge Coordination 1 10 []  - Complex (extensive) Discharge Coordination 0 PROCESS - Special Needs []  - Pediatric / Minor Patient Management 0 []  - Isolation Patient Management 0 []  - Hearing / Language / Visual special needs 0 []  - Assessment of Community assistance (transportation, D/C planning, etc.) 0 []  - Additional assistance / Altered mentation 0 []  - Support Surface(s) Assessment (bed, cushion, seat, etc.) 0 INTERVENTIONS -  Wound Cleansing / Measurement []  - Simple Wound Cleansing - one wound 0 X - Complex Wound Cleansing - multiple wounds 2 5 X - Wound Imaging (photographs - any number of wounds)  Reduction in Area: 100.00% N/A N/A % Reduction in Volume: 100.00% N/A N/A Classification: Full Thickness Without N/A N/A Exposed Support Structures Exudate Amount: N/A N/A N/A Wound Margin: N/A N/A N/A Granulation Amount: N/A N/A N/A Necrotic Amount: N/A N/A N/A Necrotic Tissue: N/A N/A N/A Exposed Structures: N/A N/A N/A Epithelialization: N/A N/A N/A Periwound Skin Texture: No Abnormalities Noted N/A N/A Periwound Skin No Abnormalities Noted N/A N/A Moisture: Periwound Skin Color: No Abnormalities Noted N/A N/A Temperature: N/A N/A N/A Tenderness on No N/A N/A Palpation: Wound Preparation: N/A N/A N/A Treatment Notes Electronic Signature(s) Signed: 09/05/2015 5:56:46 PM By: Montey Hora Entered By: Montey Hora on 09/05/2015 11:00:16 [name]  ([id number]) -------------------------------------------------------------------------------- [place] Details Patient Name: [name] Date of Service: 09/05/2015 10:45 AM Medical Record Number: [id number] Patient Account Number: [id number] Date of Birth/Sex: Apr 06, 1966 (50 y.o. Female) Treating RN: Montey Hora Primary Care Physician: Arnette Norris Other Clinician: Referring Physician: Arnette Norris Treating Physician/Extender: Frann Rider in Treatment: 30 Active Inactive Abuse / Safety / Falls / Self Care Management Nursing Diagnoses: Impaired physical mobility Potential for falls Goals: Patient will remain injury free Date Initiated: 04/23/2015 Goal Status: Active Interventions: Assess fall risk on admission and as needed Notes: Orientation to the Wound Care Program Nursing Diagnoses: Knowledge deficit related to the wound healing center program Goals: Patient/caregiver will verbalize understanding of the [facility] Date Initiated: 04/23/2015 Goal Status: Active Interventions: Provide education on orientation to the wound center Notes: Peripheral Neuropathy Nursing Diagnoses: Potential alteration in peripheral tissue perfusion (select prior to confirmation of diagnosis) Goals: Patient/caregiver will verbalize understanding of disease process and disease management JAYLYNE, GRAB ([id number]) Date Initiated: 04/23/2015 Goal Status: Active Interventions: Assess signs and symptoms of neuropathy upon admission and as needed Notes: Pressure Nursing Diagnoses: Potential for impaired tissue integrity related to pressure, friction, moisture, and shear Goals: Patient will remain free from development of additional pressure ulcers Date Initiated: 04/23/2015 Goal Status: Active Interventions: Assess offloading mechanisms upon admission and as needed Notes: Wound/Skin Impairment Nursing Diagnoses: Impaired tissue  integrity Goals: Ulcer/skin breakdown will have a volume reduction of 30% by week 4 Date Initiated: 04/23/2015 Goal Status: Active Ulcer/skin breakdown will have a volume reduction of 50% by week 8 Date Initiated: 04/23/2015 Goal Status: Active Ulcer/skin breakdown will have a volume reduction of 80% by week 12 Date Initiated: 04/23/2015 Goal Status: Active Ulcer/skin breakdown will heal within 14 weeks Date Initiated: 04/23/2015 Goal Status: Active Interventions: Assess patient/caregiver ability to obtain necessary supplies Assess ulceration(s) every visit Notes: AURIANA, MCGUGAN ([id number]) Electronic Signature(s) Signed: 09/05/2015 5:56:46 PM By: Montey Hora Entered By: Montey Hora on 09/05/2015 10:58:54 [name] ([id number]) -------------------------------------------------------------------------------- Pain Assessment Details Patient Name: [name] Date of Service: 09/05/2015 10:45 AM Medical Record Number: [id number] Patient Account Number: [id number] Date of Birth/Sex: 04/02/1966 (50 y.o. Female) Treating RN: Montey Hora Primary Care Physician: Arnette Norris Other Clinician: Referring Physician: Arnette Norris Treating Physician/Extender: Frann Rider in Treatment: 85 Active Problems Location of Pain Severity and Description of Pain Patient Has Paino No Site Locations Pain Management and Medication Current Pain Management: Electronic Signature(s) Signed: 09/05/2015 5:56:46 PM By: Montey Hora Entered By: Montey Hora on 09/05/2015 10:51:41 [name] ([id number]) -------------------------------------------------------------------------------- Patient/Caregiver Education Details Patient Name: [name] Date of Service: 09/05/2015 10:45 AM Medical Record Number: [id number] Patient Account Number: [id number] Date of Birth/Gender: 07/23/1966 (50 y.o. Female) Treating RN: Montey Hora Primary Care Physician: Arnette Norris Other Clinician:  Referring Physician: Arnette Norris Treating Physician/Extender: Frann Rider in Treatment: 63 Education Assessment Education Provided To: Patient Education Topics Provided Wound/Skin Impairment: Handouts: Other: do dressing as directed Methods: Demonstration, Explain/Verbal Responses: State content correctly Electronic Signature(s) Signed: 09/05/2015 5:56:46 PM By: Montey Hora Entered By: Montey Hora on 09/05/2015 11:14:05 [name] ([id number]) -------------------------------------------------------------------------------- Wound Assessment Details Patient Name: [name] Date of Service: 09/05/2015 10:45 AM Medical Record Number: [id number] Patient Account Number: [id number] Date of Birth/Sex: [birth year] (50 y.o. Female) Treating RN: Montey Hora Primary Care Physician: Arnette Norris Other Clinician: Referring Physician: Arnette Norris Treating Physician/Extender: Frann Rider in Treatment: 19 Wound Status Wound Number: 12 Primary Trauma, Other Etiology: Wound Location: Right Hand - 1st Digit Wound Healed - Epithelialized Wounding Event: Trauma Status: Date Acquired: 12/04/2014 Comorbid Cataracts, Anemia, Type II Diabetes, Weeks Of Treatment: 19 History: End Stage Renal Disease, History of Clustered Wound: No Burn, Neuropathy Photos Photo Uploaded By: Montey Hora on 09/05/2015 14:32:43 Wound Measurements Length: (cm) 0 % Reduction i Width: (cm) 0 % Reduction i Depth: (cm) 0 Epithelializa Area: (cm) 0 Tunneling: Volume: (cm) 0 Undermining: n Area: 100% n Volume: 100% tion: Large (67-100%) No No Wound Description Classification: Partial Thickness Exudate Amount: None Present Wound Bed Granulation Amount: None Present (0%) Necrotic Amount: None Present (0%) Periwound Skin Texture Texture Color No Abnormalities Noted: No No Abnormalities Noted: No Moisture Dula, Annayah R. ([id number]) No  Abnormalities Noted: No Wound Preparation Ulcer Cleansing: Not Cleansed Topical Anesthetic Applied: None Electronic Signature(s) Signed: 09/05/2015 5:56:46 PM By: Montey Hora Entered By: Montey Hora on 09/05/2015 10:55:42 [name] ([id number]) -------------------------------------------------------------------------------- Wound Assessment Details Patient Name: [name] Date of Service: 09/05/2015 10:45 AM Medical Record Number: [id number] Patient Account Number: [id number] Date of Birth/Sex: Jun 09, 1966 (50 y.o. Female) Treating RN: Montey Hora Primary Care Physician: Arnette Norris Other Clinician: Referring Physician: Arnette Norris Treating Physician/Extender: Frann Rider in Treatment: 19 Wound Status Wound Number: 14 Primary Trauma, Other Etiology: Wound Location: Left Hand - 1st Digit Wound Open Wounding Event: Trauma Status: Date Acquired: 12/04/2014 Comorbid Cataracts, Anemia, Type II Diabetes, Weeks Of Treatment: 19 History: End Stage Renal Disease, History of Clustered Wound: No Burn, Neuropathy Photos Photo Uploaded By: Montey Hora on 09/05/2015 15:48:39 Wound Measurements Length: (cm) 0.9 Width: (cm) 1.8 Depth: (cm) 0.1 Area: (cm) 1.272 Volume: (cm) 0.127 % Reduction in Area: -200% % Reduction in Volume: -202.4% Epithelialization: None Tunneling: No Undermining: No Wound Description Classification: Partial Thickness Wound Margin: Flat and Intact Exudate Amount: None Present Foul Odor After Cleansing: No Wound Bed Granulation Amount: None Present (0%) Exposed Structure Necrotic Amount: Large (67-100%) Fascia Exposed: No Necrotic Quality: Eschar Fat Layer Exposed: No Tendon Exposed: No Muscle Exposed: No Joint Exposed: No Hedgepeth, Aury R. ([id number]) Bone Exposed: No Limited to Skin Breakdown Periwound Skin Texture Texture Color No Abnormalities Noted: No No Abnormalities Noted: No Callus: No Atrophie  Blanche: No Crepitus: No Cyanosis: No Excoriation: No Ecchymosis: No Fluctuance: No Erythema: No Friable: No Hemosiderin Staining: No Induration: No Mottled: No Localized Edema: No Pallor: No Rash: No Rubor: No Scarring: No Temperature / Pain Moisture Temperature: No Abnormality No Abnormalities Noted: No Dry / Scaly: Yes Maceration: No Moist: No Wound Preparation Ulcer Cleansing: Rinsed/Irrigated with Saline Topical Anesthetic Applied: None Treatment Notes Wound #14 (Left Hand - 1st Digit) 1. Cleansed with: Clean wound with Normal Saline Notes betadine Electronic Signature(s) Signed: 09/05/2015 5:56:46 PM By: Montey Hora Entered By: Montey Hora on 09/05/2015 10:56:20 Sabree, Fawn  Reduction in Area: 100.00% N/A N/A % Reduction in Volume: 100.00% N/A N/A Classification: Full Thickness Without N/A N/A Exposed Support Structures Exudate Amount: N/A N/A N/A Wound Margin: N/A N/A N/A Granulation Amount: N/A N/A N/A Necrotic Amount: N/A N/A N/A Necrotic Tissue: N/A N/A N/A Exposed Structures: N/A N/A N/A Epithelialization: N/A N/A N/A Periwound Skin Texture: No Abnormalities Noted N/A N/A Periwound Skin No Abnormalities Noted N/A N/A Moisture: Periwound Skin Color: No Abnormalities Noted N/A N/A Temperature: N/A N/A N/A Tenderness on No N/A N/A Palpation: Wound Preparation: N/A N/A N/A Treatment Notes Electronic Signature(s) Signed: 09/05/2015 5:56:46 PM By: Montey Hora Entered By: Montey Hora on 09/05/2015 11:00:16 [name]  ([id number]) -------------------------------------------------------------------------------- [place] Details Patient Name: [name] Date of Service: 09/05/2015 10:45 AM Medical Record Number: [id number] Patient Account Number: [id number] Date of Birth/Sex: Apr 06, 1966 (50 y.o. Female) Treating RN: Montey Hora Primary Care Physician: Arnette Norris Other Clinician: Referring Physician: Arnette Norris Treating Physician/Extender: Frann Rider in Treatment: 30 Active Inactive Abuse / Safety / Falls / Self Care Management Nursing Diagnoses: Impaired physical mobility Potential for falls Goals: Patient will remain injury free Date Initiated: 04/23/2015 Goal Status: Active Interventions: Assess fall risk on admission and as needed Notes: Orientation to the Wound Care Program Nursing Diagnoses: Knowledge deficit related to the wound healing center program Goals: Patient/caregiver will verbalize understanding of the [facility] Date Initiated: 04/23/2015 Goal Status: Active Interventions: Provide education on orientation to the wound center Notes: Peripheral Neuropathy Nursing Diagnoses: Potential alteration in peripheral tissue perfusion (select prior to confirmation of diagnosis) Goals: Patient/caregiver will verbalize understanding of disease process and disease management JAYLYNE, GRAB ([id number]) Date Initiated: 04/23/2015 Goal Status: Active Interventions: Assess signs and symptoms of neuropathy upon admission and as needed Notes: Pressure Nursing Diagnoses: Potential for impaired tissue integrity related to pressure, friction, moisture, and shear Goals: Patient will remain free from development of additional pressure ulcers Date Initiated: 04/23/2015 Goal Status: Active Interventions: Assess offloading mechanisms upon admission and as needed Notes: Wound/Skin Impairment Nursing Diagnoses: Impaired tissue  integrity Goals: Ulcer/skin breakdown will have a volume reduction of 30% by week 4 Date Initiated: 04/23/2015 Goal Status: Active Ulcer/skin breakdown will have a volume reduction of 50% by week 8 Date Initiated: 04/23/2015 Goal Status: Active Ulcer/skin breakdown will have a volume reduction of 80% by week 12 Date Initiated: 04/23/2015 Goal Status: Active Ulcer/skin breakdown will heal within 14 weeks Date Initiated: 04/23/2015 Goal Status: Active Interventions: Assess patient/caregiver ability to obtain necessary supplies Assess ulceration(s) every visit Notes: AURIANA, MCGUGAN ([id number]) Electronic Signature(s) Signed: 09/05/2015 5:56:46 PM By: Montey Hora Entered By: Montey Hora on 09/05/2015 10:58:54 [name] ([id number]) -------------------------------------------------------------------------------- Pain Assessment Details Patient Name: [name] Date of Service: 09/05/2015 10:45 AM Medical Record Number: [id number] Patient Account Number: [id number] Date of Birth/Sex: 04/02/1966 (50 y.o. Female) Treating RN: Montey Hora Primary Care Physician: Arnette Norris Other Clinician: Referring Physician: Arnette Norris Treating Physician/Extender: Frann Rider in Treatment: 85 Active Problems Location of Pain Severity and Description of Pain Patient Has Paino No Site Locations Pain Management and Medication Current Pain Management: Electronic Signature(s) Signed: 09/05/2015 5:56:46 PM By: Montey Hora Entered By: Montey Hora on 09/05/2015 10:51:41 [name] ([id number]) -------------------------------------------------------------------------------- Patient/Caregiver Education Details Patient Name: [name] Date of Service: 09/05/2015 10:45 AM Medical Record Number: [id number] Patient Account Number: [id number] Date of Birth/Gender: 07/23/1966 (50 y.o. Female) Treating RN: Montey Hora Primary Care Physician: Arnette Norris Other Clinician:  Referring Physician: Arnette Norris Treating Physician/Extender: Frann Rider in Treatment: 63 Education Assessment Education Provided To: Patient Education Topics Provided Wound/Skin Impairment: Handouts: Other: do dressing as directed Methods: Demonstration, Explain/Verbal Responses: State content correctly Electronic Signature(s) Signed: 09/05/2015 5:56:46 PM By: Montey Hora Entered By: Montey Hora on 09/05/2015 11:14:05 [name] ([id number]) -------------------------------------------------------------------------------- Wound Assessment Details Patient Name: [name] Date of Service: 09/05/2015 10:45 AM Medical Record Number: [id number] Patient Account Number: [id number] Date of Birth/Sex: [birth year] (50 y.o. Female) Treating RN: Montey Hora Primary Care Physician: Arnette Norris Other Clinician: Referring Physician: Arnette Norris Treating Physician/Extender: Frann Rider in Treatment: 19 Wound Status Wound Number: 12 Primary Trauma, Other Etiology: Wound Location: Right Hand - 1st Digit Wound Healed - Epithelialized Wounding Event: Trauma Status: Date Acquired: 12/04/2014 Comorbid Cataracts, Anemia, Type II Diabetes, Weeks Of Treatment: 19 History: End Stage Renal Disease, History of Clustered Wound: No Burn, Neuropathy Photos Photo Uploaded By: Montey Hora on 09/05/2015 14:32:43 Wound Measurements Length: (cm) 0 % Reduction i Width: (cm) 0 % Reduction i Depth: (cm) 0 Epithelializa Area: (cm) 0 Tunneling: Volume: (cm) 0 Undermining: n Area: 100% n Volume: 100% tion: Large (67-100%) No No Wound Description Classification: Partial Thickness Exudate Amount: None Present Wound Bed Granulation Amount: None Present (0%) Necrotic Amount: None Present (0%) Periwound Skin Texture Texture Color No Abnormalities Noted: No No Abnormalities Noted: No Moisture Dula, Annayah R. ([id number]) No  Abnormalities Noted: No Wound Preparation Ulcer Cleansing: Not Cleansed Topical Anesthetic Applied: None Electronic Signature(s) Signed: 09/05/2015 5:56:46 PM By: Montey Hora Entered By: Montey Hora on 09/05/2015 10:55:42 [name] ([id number]) -------------------------------------------------------------------------------- Wound Assessment Details Patient Name: [name] Date of Service: 09/05/2015 10:45 AM Medical Record Number: [id number] Patient Account Number: [id number] Date of Birth/Sex: Jun 09, 1966 (50 y.o. Female) Treating RN: Montey Hora Primary Care Physician: Arnette Norris Other Clinician: Referring Physician: Arnette Norris Treating Physician/Extender: Frann Rider in Treatment: 19 Wound Status Wound Number: 14 Primary Trauma, Other Etiology: Wound Location: Left Hand - 1st Digit Wound Open Wounding Event: Trauma Status: Date Acquired: 12/04/2014 Comorbid Cataracts, Anemia, Type II Diabetes, Weeks Of Treatment: 19 History: End Stage Renal Disease, History of Clustered Wound: No Burn, Neuropathy Photos Photo Uploaded By: Montey Hora on 09/05/2015 15:48:39 Wound Measurements Length: (cm) 0.9 Width: (cm) 1.8 Depth: (cm) 0.1 Area: (cm) 1.272 Volume: (cm) 0.127 % Reduction in Area: -200% % Reduction in Volume: -202.4% Epithelialization: None Tunneling: No Undermining: No Wound Description Classification: Partial Thickness Wound Margin: Flat and Intact Exudate Amount: None Present Foul Odor After Cleansing: No Wound Bed Granulation Amount: None Present (0%) Exposed Structure Necrotic Amount: Large (67-100%) Fascia Exposed: No Necrotic Quality: Eschar Fat Layer Exposed: No Tendon Exposed: No Muscle Exposed: No Joint Exposed: No Hedgepeth, Aury R. ([id number]) Bone Exposed: No Limited to Skin Breakdown Periwound Skin Texture Texture Color No Abnormalities Noted: No No Abnormalities Noted: No Callus: No Atrophie  Blanche: No Crepitus: No Cyanosis: No Excoriation: No Ecchymosis: No Fluctuance: No Erythema: No Friable: No Hemosiderin Staining: No Induration: No Mottled: No Localized Edema: No Pallor: No Rash: No Rubor: No Scarring: No Temperature / Pain Moisture Temperature: No Abnormality No Abnormalities Noted: No Dry / Scaly: Yes Maceration: No Moist: No Wound Preparation Ulcer Cleansing: Rinsed/Irrigated with Saline Topical Anesthetic Applied: None Treatment Notes Wound #14 (Left Hand - 1st Digit) 1. Cleansed with: Clean wound with Normal Saline Notes betadine Electronic Signature(s) Signed: 09/05/2015 5:56:46 PM By: Montey Hora Entered By: Montey Hora on 09/05/2015 10:56:20 Sabree, Fawn  SUHAYLA, GASSAWAY ([id number]) Visit Report for 09/05/2015 Arrival Information Details Patient Name: [name] Date of Service: 09/05/2015 10:45 AM Medical Record Number: [id number] Patient Account Number: [id number] Date of Birth/Sex: [birth year] (50 y.o. Female) Treating RN: Montey Hora Primary Care Physician: Arnette Norris Other Clinician: Referring Physician: Arnette Norris Treating Physician/Extender: Frann Rider in Treatment: 54 Visit Information History Since Last Visit All ordered tests and consults were completed: No Patient Arrived: Wheel Chair Added or deleted any medications: No Arrival Time: 10:48 Any new allergies or adverse reactions: No Accompanied By: self Had a fall or experienced change in No activities of daily living that may affect Transfer Assistance: Other risk of falls: Patient Identification Verified: Yes Signs or symptoms of abuse/neglect since last No Secondary Verification Process Yes visito Completed: Hospitalized since last visit: No Patient Requires Transmission-Based No Pain Present Now: No Precautions: Patient Has Alerts: Yes Patient Alerts: DMII Electronic Signature(s) Signed: 09/05/2015 5:56:46 PM By: Montey Hora Entered By: Montey Hora on 09/05/2015 10:51:33 [name] ([id number]) -------------------------------------------------------------------------------- Clinic Level of Care Assessment Details Patient Name: [name] Date of Service: 09/05/2015 10:45 AM Medical Record Number: [id number] Patient Account Number: [id number] Date of Birth/Sex: 05-21-1966 (50 y.o. Female) Treating RN: Montey Hora Primary Care Physician: Arnette Norris Other Clinician: Referring Physician: Arnette Norris Treating Physician/Extender: Frann Rider in Treatment: 19 Clinic Level of Care Assessment Items TOOL 4 Quantity Score []  - Use when only an EandM is performed on FOLLOW-UP visit 0 ASSESSMENTS - Nursing  Assessment / Reassessment []  - Reassessment of Co-morbidities (includes updates in patient status) 0 X - Reassessment of Adherence to Treatment Plan 1 5 ASSESSMENTS - Wound and Skin Assessment / Reassessment []  - Simple Wound Assessment / Reassessment - one wound 0 X - Complex Wound Assessment / Reassessment - multiple wounds 2 5 []  - Dermatologic / Skin Assessment (not related to wound area) 0 ASSESSMENTS - Focused Assessment []  - Circumferential Edema Measurements - multi extremities 0 []  - Nutritional Assessment / Counseling / Intervention 0 []  - Lower Extremity Assessment (monofilament, tuning fork, pulses) 0 []  - Peripheral Arterial Disease Assessment (using hand held doppler) 0 ASSESSMENTS - Ostomy and/or Continence Assessment and Care []  - Incontinence Assessment and Management 0 []  - Ostomy Care Assessment and Management (repouching, etc.) 0 PROCESS - Coordination of Care X - Simple Patient / Family Education for ongoing care 1 15 []  - Complex (extensive) Patient / Family Education for ongoing care 0 X - Staff obtains Programmer, systems, Records, Test Results / Process Orders 1 10 []  - Staff telephones HHA, Nursing Homes / Clarify orders / etc 0 []  - Routine Transfer to another Facility (non-emergent condition) 0 MARYSSA, SOLLAZZO R. ([id number]) []  - Routine Hospital Admission (non-emergent condition) 0 []  - New Admissions / Biomedical engineer / Ordering NPWT, Apligraf, etc. 0 []  - Emergency Hospital Admission (emergent condition) 0 X - Simple Discharge Coordination 1 10 []  - Complex (extensive) Discharge Coordination 0 PROCESS - Special Needs []  - Pediatric / Minor Patient Management 0 []  - Isolation Patient Management 0 []  - Hearing / Language / Visual special needs 0 []  - Assessment of Community assistance (transportation, D/C planning, etc.) 0 []  - Additional assistance / Altered mentation 0 []  - Support Surface(s) Assessment (bed, cushion, seat, etc.) 0 INTERVENTIONS -  Wound Cleansing / Measurement []  - Simple Wound Cleansing - one wound 0 X - Complex Wound Cleansing - multiple wounds 2 5 X - Wound Imaging (photographs - any number of wounds)  cm) 0.184 % Reduction in Area: -192.7% % Reduction in Volume: -192.1% Epithelialization: None Tunneling: No Undermining: No Wound Description Classification: Partial Thickness Wound Margin: Flat and Intact Exudate Amount: None Present Foul Odor After Cleansing: No Wound Bed Granulation Amount: None Present (0%) Exposed Structure Necrotic Amount: Large (67-100%) Fascia Exposed: No Necrotic Quality: Eschar Fat Layer Exposed: No Tendon Exposed: No Muscle Exposed: No Joint Exposed:  No Seeberger, Renezmae R. ([id number]) Bone Exposed: No Limited to Skin Breakdown Periwound Skin Texture Texture Color No Abnormalities Noted: No No Abnormalities Noted: No Callus: No Atrophie Blanche: No Crepitus: No Cyanosis: No Excoriation: No Ecchymosis: No Fluctuance: No Erythema: No Friable: No Hemosiderin Staining: No Induration: No Mottled: No Localized Edema: No Pallor: No Rash: No Rubor: No Scarring: No Moisture No Abnormalities Noted: No Dry / Scaly: Yes Maceration: No Moist: No Wound Preparation Ulcer Cleansing: Rinsed/Irrigated with Saline Topical Anesthetic Applied: None Treatment Notes Wound #19 (Right Hand - 4th Digit) 1. Cleansed with: Clean wound with Normal Saline Notes betadine Electronic Signature(s) Signed: 09/05/2015 5:56:46 PM By: Montey Hora Entered By: Montey Hora on 09/05/2015 10:58:10 [name] ([id number]) -------------------------------------------------------------------------------- Wound Assessment Details Patient Name: [name] Date of Service: 09/05/2015 10:45 AM Medical Record Number: [id number] Patient Account Number: [id number] Date of Birth/Sex: 10/19/1965 (50 y.o. Female) Treating RN: Montey Hora Primary Care Physician: Arnette Norris Other Clinician: Referring Physician: Arnette Norris Treating Physician/Extender: Frann Rider in Treatment: 19 Wound Status Wound Number: 20 Primary Etiology: Trauma, Other Wound Location: Right Hand - 5th Digit Wound Status: Healed - Epithelialized Wounding Event: Trauma Date Acquired: 06/27/2015 Weeks Of Treatment: 10 Clustered Wound: No Photos Photo Uploaded By: Montey Hora on 09/05/2015 15:49:45 Wound Measurements Length: (cm) 0 % Reduction Width: (cm) 0 % Reduction Depth: (cm) 0 Area: (cm) 0 Volume: (cm) 0 in Area: 100% in Volume: 100% Wound Description Classification: Partial Thickness Periwound Skin Texture Texture Color No  Abnormalities Noted: No No Abnormalities Noted: No Moisture No Abnormalities Noted: No Electronic Signature(s) Signed: 09/05/2015 5:56:46 PM By: Charlean Merl, Danton Sewer ([id number]) Entered By: Montey Hora on 09/05/2015 10:58:33 [name] ([id number]) -------------------------------------------------------------------------------- Wound Assessment Details Patient Name: [name] Date of Service: 09/05/2015 10:45 AM Medical Record Number: [id number] Patient Account Number: [id number] Date of Birth/Sex: October 12, 1965 (50 y.o. Female) Treating RN: Montey Hora Primary Care Physician: Arnette Norris Other Clinician: Referring Physician: Arnette Norris Treating Physician/Extender: Frann Rider in Treatment: 19 Wound Status Wound Number: 22 Primary Etiology: Open Surgical Wound Wound Location: Left Hand - Palm Wound Status: Healed - Epithelialized Wounding Event: Surgical Injury Date Acquired: 06/27/2015 Weeks Of Treatment: 8 Clustered Wound: No Photos Photo Uploaded By: Montey Hora on 09/05/2015 15:47:47 Wound Measurements Length: (cm) 0 % Reducti Width: (cm) 0 % Reducti Depth: (cm) 0 Area: (cm) 0 Volume: (cm) 0 on in Area: 100% on in Volume: 100% Wound Description Full Thickness Without Exposed Classification: Support Structures Periwound Skin Texture Texture Color No Abnormalities Noted: No No Abnormalities Noted: No Moisture No Abnormalities Noted: No Electronic Signature(s) Signed: 09/05/2015 5:56:46 PM By: Charlean Merl, Danton Sewer ([id number]) Entered By: Montey Hora on 09/05/2015 10:58:34 [name] ([id number]) -------------------------------------------------------------------------------- Vitals Details Patient Name: [name] Date of Service: 09/05/2015 10:45 AM Medical Record Number: [id number] Patient Account Number: [id number] Date of Birth/Sex: 10-Nov-1965 (50 y.o. Female) Treating RN:  Montey Hora Primary Care Physician: Arnette Norris Other Clinician: Referring Physician: Arnette Norris Treating Physician/Extender: Frann Rider in Treatment: 46 Vital Signs Time Taken: 10:52

## 2015-09-07 NOTE — Progress Notes (Signed)
[name], [name] ([id number]) Visit Report for 09/05/2015 Chief Complaint Document Details Patient Name: [name], [name] Date of Service: 09/05/2015 10:45 AM Medical Record Number: [id number] Patient Account Number: [id number] Date of Birth/Sex: 06/18/1966 (50 y.o. Female) Treating RN: Montey Hora Primary Care Physician: Arnette Norris Other Clinician: Referring Physician: Arnette Norris Treating Physician/Extender: Frann Rider in Treatment: 22 Information Obtained from: Patient Chief Complaint Patient presents to the wound care center for a consult due non healing wound The patient has one area on her right gluteal region and several fingers involved with ulcerations. This has been there for about 3 months. Electronic Signature(s) Signed: 09/05/2015 11:11:52 AM By: [name] MD, FACS Entered By: [name] on 09/05/2015 11:11:51 [name], [name] ([id number]) -------------------------------------------------------------------------------- HPI Details Patient Name: [name] Date of Service: 09/05/2015 10:45 AM Medical Record Number: [id number] Patient Account Number: [id number] Date of Birth/Sex: [birth year] (49 y.o. Female) Treating RN: Montey Hora Primary Care Physician: Arnette Norris Other Clinician: Referring Physician: Arnette Norris Treating Physician/Extender: Frann Rider in Treatment: 16 History of Present Illness Location: right gluteal area and 3 fingers on her right hand and 4 fingers on her left hand involved with ulceration. Quality: Patient reports No Pain. Severity: Patient states wound are getting worse. Duration: Patient has had the wound for > 3 months prior to seeking treatment at the wound center Context: The wound appeared gradually over time Modifying Factors: Patient is currently on renal dialysis and receives treatments 3 times weekly HPI Description: Pleasant 50 year old patient who is known to the wound clinic from previous  visits. She has significant diabetic neuropathy and has no sensation in her hands and feet. She is wheelchair-bound and takes dialysis on a regular basis 3 times a week. 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 Past medical history significant for diabetes mellitus,end-stage renal disease on hemodialysis, coronary artery disease, multiple amputations, sleep apnea, wheelchair bound due to severe peripheral neuropathy. She has now declined quite significantly and is wheelchair-bound all the time. She has developed decubitus ulcers of the skin and has been referred to [place] by her PCP. She was also recently seen by Dr. Celesta Gentile for her feet and he noticed that she had a ulceration of the right third toe and put her on Levaquin and local care. She says she is laying down in bed much more now and has been trying to offload as much as possible. Due to a psychiatric issue she has been biting her fingernails and has caused much ulceration to both hands. Part of it may be related to the medication she was on. 05/14/2015 -- she says she is very sore around her anus and the vagina and has a lot of redness and problems there. I have reviewed her x-rays which were done of both hands and they show: X-ray of the left hand shows no acute fracture, dislocation or convincing osteomyelitis. X-ray of the right hand shows deformities of the distal phalanx of the first second and third digits with peripheral vascular disease but no evidence of acute fracture dislocation or osteomyelitis. 05/28/2015 -- she has seen the dermatologist and will also be seeing the infectious disease doctor soon and is on a proper regimen for her multiple ailments. 06/27/2015 -- she has been wearing a splint on her left hand and last week she had a blister and bleb on the left middle  finger which was  debrided by the burn center at [facility]. Since then she's been having a lot of pain and swelling. She has been worse for the last 3-4 days. 07/11/2015 - she was admitted to [facility] for a badly infected left middle finger infection with sepsis and was admitted between 06/27/2015 to 07/03/2015. She was discharged home on vancomycin and supportive care. during her hospital stay infectious disease treated her with vancomycin and Cipro and an incision and drainage was done of the left third digit. The procedure was done on 06/27/2015 and consisted of an [name], [name]. ([id number]) amputation of the tip of the left long finger at the middle phalanx level irrigation and debridement of full- thickness skin and subcutaneous tissue and tendon.An MRI of the right foot showed no evidence of osteomyelitis. 07/18/2015 -- the patient was reviewed by her orthopedic surgeon Dr. Lenon Curt on noted that since the flexor tendon of the left middle finger was necrotic and was removed that she will have a problem with flexing of her middle finger. He also saw her right thumb was fractured but did not recommend any specific therapy. He asked her to continue with the wound center. 09/05/2015 -- the patient has been doing fairly fine and continues to have issues with the digits of her fingers and a new issue is that the right ring finger has now got a flexion contracture. Electronic Signature(s) Signed: 09/05/2015 11:12:37 AM By: [name] MD, FACS Entered By: [name] on 09/05/2015 11:12:36 [name], [name] ([id number]) -------------------------------------------------------------------------------- Physical Exam Details Patient Name: [name] Date of Service: 09/05/2015 10:45 AM Medical Record Number: [id number] Patient Account Number: [id number] Date of Birth/Sex: 02-12-1966 (49 y.o. Female) Treating RN: Montey Hora Primary Care Physician: Arnette Norris Other Clinician: Referring  Physician: Arnette Norris Treating Physician/Extender: Frann Rider in Treatment: 19 Constitutional . Pulse regular. Respirations normal and unlabored. Afebrile. . Eyes Nonicteric. Reactive to light. Ears, Nose, Mouth, and Throat Lips, teeth, and gums WNL.Marland Kitchen Moist mucosa without lesions. Neck supple and nontender. No palpable supraclavicular or cervical adenopathy. Normal sized without goiter. Respiratory WNL. No retractions.. Cardiovascular Pedal Pulses WNL. No clubbing, cyanosis or edema. Chest Breasts symmetical and no nipple discharge.. Breast tissue WNL, no masses, lumps, or tenderness.. Lymphatic No adneopathy. No adenopathy. No adenopathy. Musculoskeletal Adexa without tenderness or enlargement.. Digits and nails w/o clubbing, cyanosis, infection, petechiae, ischemia, or inflammatory conditions.. Integumentary (Hair, Skin) No suspicious lesions. No crepitus or fluctuance. No peri-wound warmth or erythema. No masses.Marland Kitchen Psychiatric Judgement and insight Intact.. No evidence of depression, anxiety, or agitation.. Notes the tips of a few fingers on both hands have dry gangrene and they will will continue to be painted with beta Marlou Sa. The rest of the wounds have healed and on the right hand she has a flexion contracture of the ring finger. Electronic Signature(s) Signed: 09/05/2015 11:14:20 AM By: [name] MD, FACS Entered By: [name] on 09/05/2015 11:14:19 [name], [name] ([id number]) -------------------------------------------------------------------------------- Physician Orders Details Patient Name: [name] Date of Service: 09/05/2015 10:45 AM Medical Record Number: [id number] Patient Account Number: [id number] Date of Birth/Sex: [birth year] (50 y.o. Female) Treating RN: Montey Hora Primary Care Physician: Arnette Norris Other Clinician: Referring Physician: Arnette Norris Treating Physician/Extender: Frann Rider in Treatment: 83 Verbal /  Phone Orders: Yes Clinician: Montey Hora Read Back and Verified: Yes Diagnosis Coding Wound Cleansing Wound #14 Left Hand - 1st Digit o Clean wound with Normal Saline. Wound #19 Right Hand - 4th  Digit o Clean wound with Normal Saline. Primary Wound Dressing Wound #14 Left Hand - 1st Digit o Other: - Betadine Paint Wound #19 Right Hand - 4th Digit o Other: - Betadine Paint Dressing Change Frequency Wound #14 Left Hand - 1st Digit o Change dressing every day. Wound #19 Right Hand - 4th Digit o Change dressing every day. Follow-up Appointments Wound #14 Left Hand - 1st Digit o Return Appointment in 2 weeks. Wound #19 Right Hand - 4th Digit o Return Appointment in 2 weeks. Electronic Signature(s) Signed: 09/05/2015 5:56:46 PM By: Montey Hora Signed: 09/06/2015 5:05:49 PM By: [name] MD, FACS Entered By: Montey Hora on 09/05/2015 11:09:13 [name], [name] ([id number]) -------------------------------------------------------------------------------- Problem List Details Patient Name: [name] Date of Service: 09/05/2015 10:45 AM Medical Record Number: [id number] Patient Account Number: [id number] Date of Birth/Sex: [birth year] (50 y.o. Female) Treating RN: Montey Hora Primary Care Physician: Arnette Norris Other Clinician: Referring Physician: Arnette Norris Treating Physician/Extender: Frann Rider in Treatment: 27 Active Problems ICD-10 Encounter Code Description Active Date Diagnosis E11.40 Type 2 diabetes mellitus with diabetic neuropathy, 04/23/2015 Yes unspecified Z99.2 Dependence on renal dialysis 04/23/2015 Yes L89.310 Pressure ulcer of right buttock, unstageable 04/23/2015 Yes S61.300A Unspecified open wound of right index finger with damage 04/23/2015 Yes to nail, initial encounter S61.301A Unspecified open wound of left index finger with damage 04/23/2015 Yes to nail, initial encounter S61.307A Unspecified open wound of left  little finger with damage to 04/23/2015 Yes nail, initial encounter S61.303A Unspecified open wound of left middle finger with damage 04/23/2015 Yes to nail, initial encounter Inactive Problems Resolved Problems Electronic Signature(s) Signed: 09/05/2015 11:11:21 AM By: [name] MD, FACS Entered By: [name] on 09/05/2015 11:11:20 [name], [name] ([id number] [name] ([id number]) -------------------------------------------------------------------------------- Progress Note Details Patient Name: [name] Date of Service: 09/05/2015 10:45 AM Medical Record Number: [id number] Patient Account Number: [id number] Date of Birth/Sex: 1965-09-20 (50 y.o. Female) Treating RN: Montey Hora Primary Care Physician: Arnette Norris Other Clinician: Referring Physician: Arnette Norris Treating Physician/Extender: Frann Rider in Treatment: 11 Subjective Chief Complaint Information obtained from Patient Patient presents to the wound care center for a consult due non healing wound The patient has one area on her right gluteal region and several fingers involved with ulcerations. This has been there for about 3 months. History of Present Illness (HPI) The following HPI elements were documented for the patient's wound: Location: right gluteal area and 3 fingers on her right hand and 4 fingers on her left hand involved with ulceration. Quality: Patient reports No Pain. Severity: Patient states wound are getting worse. Duration: Patient has had the wound for > 3 months prior to seeking treatment at the wound center Context: The wound appeared gradually over time Modifying Factors: Patient is currently on renal dialysis and receives treatments 3 times weekly Pleasant 50 year old patient who is known to the wound clinic from previous visits. She has significant diabetic neuropathy and has no sensation in her hands and feet. She is wheelchair-bound and takes dialysis on  a regular basis 3 times a week. 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 Past medical history significant for diabetes mellitus,end-stage renal disease on hemodialysis, coronary artery disease, multiple amputations, sleep apnea, wheelchair bound due to severe peripheral neuropathy. She has now declined quite significantly and is wheelchair-bound all the time. She has developed decubitus ulcers  of the skin and has been referred to [place] by her PCP. She was also recently seen by Dr. Celesta Gentile for her feet and he noticed that she had a ulceration of the right third toe and put her on Levaquin and local care. She says she is laying down in bed much more now and has been trying to offload as much as possible. Due to a psychiatric issue she has been biting her fingernails and has caused much ulceration to both hands. Part of it may be related to the medication she was on. 05/14/2015 -- she says she is very sore around her anus and the vagina and has a lot of redness and problems there. I have reviewed her x-rays which were done of both hands and they show: X-ray of the left hand shows no acute fracture, dislocation or convincing osteomyelitis. X-ray of the right hand shows deformities of the distal phalanx of the first second and third digits with peripheral vascular disease but no evidence of acute fracture dislocation or osteomyelitis. [name], [name] ([id number]) 05/28/2015 -- she has seen the dermatologist and will also be seeing the infectious disease doctor soon and is on a proper regimen for her multiple ailments. 06/27/2015 -- she has been wearing a splint on her left hand and last week she had a blister and bleb on the left middle finger which was debrided by the burn center at [facility]. Since then she's been having a lot of pain and swelling. She  has been worse for the last 3-4 days. 07/11/2015 - she was admitted to [facility] for a badly infected left middle finger infection with sepsis and was admitted between 06/27/2015 to 07/03/2015. She was discharged home on vancomycin and supportive care. during her hospital stay infectious disease treated her with vancomycin and Cipro and an incision and drainage was done of the left third digit. The procedure was done on 06/27/2015 and consisted of an amputation of the tip of the left long finger at the middle phalanx level irrigation and debridement of full- thickness skin and subcutaneous tissue and tendon.An MRI of the right foot showed no evidence of osteomyelitis. 07/18/2015 -- the patient was reviewed by her orthopedic surgeon Dr. Lenon Curt on noted that since the flexor tendon of the left middle finger was necrotic and was removed that she will have a problem with flexing of her middle finger. He also saw her right thumb was fractured but did not recommend any specific therapy. He asked her to continue with the wound center. 09/05/2015 -- the patient has been doing fairly fine and continues to have issues with the digits of her fingers and a new issue is that the right ring finger has now got a flexion contracture. Objective Constitutional Pulse regular. Respirations normal and unlabored. Afebrile. Vitals Time Taken: 10:52 AM, Height: 68 in, Weight: 220 lbs, BMI: 33.4, Temperature: 97.6 F, Pulse: 69 bpm, Respiratory Rate: 20 breaths/min, Blood Pressure: 123/62 mmHg. Eyes Nonicteric. Reactive to light. Ears, Nose, Mouth, and Throat Lips, teeth, and gums WNL.Marland Kitchen Moist mucosa without lesions. Neck supple and nontender. No palpable supraclavicular or cervical adenopathy. Normal sized without goiter. Respiratory WNL. No retractions.. Cardiovascular Pedal Pulses WNL. No clubbing, cyanosis or edema. [name], [name] ([id number]) Chest Breasts symmetical and no nipple discharge.. Breast tissue  WNL, no masses, lumps, or tenderness.. Lymphatic No adneopathy. No adenopathy. No adenopathy. Musculoskeletal Adexa without tenderness or enlargement.. Digits and nails w/o clubbing, cyanosis, infection, petechiae, ischemia, or inflammatory conditions.Marland Kitchen Psychiatric  Judgement and insight Intact.. No evidence of depression, anxiety, or agitation.. General Notes: the tips of a few fingers on both hands have dry gangrene and they will will continue to be painted with beta Marlou Sa. The rest of the wounds have healed and on the right hand she has a flexion contracture of the ring finger. Integumentary (Hair, Skin) No suspicious lesions. No crepitus or fluctuance. No peri-wound warmth or erythema. No masses.. Wound #12 status is Healed - Epithelialized. Original cause of wound was Trauma. The wound is located on the Right Hand - 1st Digit. The wound measures 0cm length x 0cm width x 0cm depth; 0cm^2 area and 0cm^3 volume. There is no tunneling or undermining noted. There is a none present amount of drainage noted. There is no granulation within the wound bed. There is no necrotic tissue within the wound bed. Wound #14 status is Open. Original cause of wound was Trauma. The wound is located on the Left Hand - 1st Digit. The wound measures 0.9cm length x 1.8cm width x 0.1cm depth; 1.272cm^2 area and 0.127cm^3 volume. The wound is limited to skin breakdown. There is no tunneling or undermining noted. There is a none present amount of drainage noted. The wound margin is flat and intact. There is no granulation within the wound bed. There is a large (67-100%) amount of necrotic tissue within the wound bed including Eschar. The periwound skin appearance exhibited: Dry/Scaly. The periwound skin appearance did not exhibit: Callus, Crepitus, Excoriation, Fluctuance, Friable, Induration, Localized Edema, Rash, Scarring, Maceration, Moist, Atrophie Blanche, Cyanosis, Ecchymosis, Hemosiderin Staining, Mottled,  Pallor, Rubor, Erythema. Periwound temperature was noted as No Abnormality. Wound #15 status is Open. Original cause of wound was Trauma. The wound is located on the Left Hand - 2nd Digit. The wound measures 0cm length x 0cm width x 0cm depth; 0cm^2 area and 0cm^3 volume. The wound is limited to skin breakdown. There is no tunneling or undermining noted. There is a none present amount of drainage noted. The wound margin is distinct with the outline attached to the wound base. There is no granulation within the wound bed. There is no necrotic tissue within the wound bed. The periwound skin appearance did not exhibit: Callus, Crepitus, Excoriation, Fluctuance, Friable, Induration, Localized Edema, Rash, Scarring, Dry/Scaly, Maceration, Moist, Atrophie Blanche, Cyanosis, Ecchymosis, Hemosiderin Staining, Mottled, Pallor, Rubor, Erythema. Periwound temperature was noted as No Abnormality. Wound #17 status is Healed - Epithelialized. Original cause of wound was Trauma. The wound is located on the Left Hand - 4th Digit. The wound measures 0cm length x 0cm width x 0cm depth; 0cm^2 area and 0cm^3 volume. [name], Tonya R. ([id number]) Wound #19 status is Open. Original cause of wound was Trauma. The wound is located on the Right Hand - 4th Digit. The wound measures 1.8cm length x 1.3cm width x 0.1cm depth; 1.838cm^2 area and 0.184cm^3 volume. The wound is limited to skin breakdown. There is no tunneling or undermining noted. There is a none present amount of drainage noted. The wound margin is flat and intact. There is no granulation within the wound bed. There is a large (67-100%) amount of necrotic tissue within the wound bed including Eschar. The periwound skin appearance exhibited: Dry/Scaly. The periwound skin appearance did not exhibit: Callus, Crepitus, Excoriation, Fluctuance, Friable, Induration, Localized Edema, Rash, Scarring, Maceration, Moist, Atrophie Blanche, Cyanosis, Ecchymosis,  Hemosiderin Staining, Mottled, Pallor, Rubor, Erythema. Wound #20 status is Healed - Epithelialized. Original cause of wound was Trauma. The wound is located on the Right  Hand - 5th Digit. The wound measures 0cm length x 0cm width x 0cm depth; 0cm^2 area and 0cm^3 volume. Wound #22 status is Healed - Epithelialized. Original cause of wound was Surgical Injury. The wound is located on the Left Hand - Palm. The wound measures 0cm length x 0cm width x 0cm depth; 0cm^2 area and 0cm^3 volume. Assessment Active Problems ICD-10 E11.40 - Type 2 diabetes mellitus with diabetic neuropathy, unspecified Z99.2 - Dependence on renal dialysis L89.310 - Pressure ulcer of right buttock, unstageable S61.300A - Unspecified open wound of right index finger with damage to nail, initial encounter S61.301A - Unspecified open wound of left index finger with damage to nail, initial encounter S61.307A - Unspecified open wound of left little finger with damage to nail, initial encounter S61.303A - Unspecified open wound of left middle finger with damage to nail, initial encounter Over the last month she have not seen her but she has made a good improvement overall and I have recommended we paint the dry eschar with Betadine and try her best not to pick on the scabs. She will come back and see me in a week or two. Plan Wound Cleansing: Wound #14 Left Hand - 1st Digit: Clean wound with Normal Saline. Wound #19 Right Hand - 4th Digit: SPRUHA, HEYD R. ([id number]) Clean wound with Normal Saline. Primary Wound Dressing: Wound #14 Left Hand - 1st Digit: Other: - Betadine Paint Wound #19 Right Hand - 4th Digit: Other: - Betadine Paint Dressing Change Frequency: Wound #14 Left Hand - 1st Digit: Change dressing every day. Wound #19 Right Hand - 4th Digit: Change dressing every day. Follow-up Appointments: Wound #14 Left Hand - 1st Digit: Return Appointment in 2 weeks. Wound #19 Right Hand - 4th  Digit: Return Appointment in 2 weeks. Over the last month she have not seen her but she has made a good improvement overall and I have recommended we paint the dry eschar with Betadine and try her best not to pick on the scabs. She will come back and see me in a week or two. Electronic Signature(s) Signed: 09/05/2015 11:15:18 AM By: [name] MD, FACS Entered By: [name] on 09/05/2015 11:15:18 RAYAAN, TONNE ([id number]) -------------------------------------------------------------------------------- SuperBill Details Patient Name: [name] Date of Service: 09/05/2015 Medical Record Number: [id number] Patient Account Number: [id number] Date of Birth/Sex: [birth year] (50 y.o. Female) Treating RN: Montey Hora Primary Care Physician: Arnette Norris Other Clinician: Referring Physician: Arnette Norris Treating Physician/Extender: Frann Rider in Treatment: 19 Diagnosis Coding ICD-10 Codes Code Description E11.40 Type 2 diabetes mellitus with diabetic neuropathy, unspecified Z99.2 Dependence on renal dialysis L89.310 Pressure ulcer of right buttock, unstageable S61.300A Unspecified open wound of right index finger with damage to nail, initial encounter S61.301A Unspecified open wound of left index finger with damage to nail, initial encounter S61.307A Unspecified open wound of left little finger with damage to nail, initial encounter S61.303A Unspecified open wound of left middle finger with damage to nail, initial encounter Facility Procedures CPT4 Code: [id number] Description: 99213 - WOUND CARE VISIT-LEV 3 EST PT Modifier: Quantity: 1 Physician Procedures CPT4: Description Modifier Quantity Code [id number] 99213 - WC PHYS LEVEL 3 - EST PT 1 ICD-10 Description Diagnosis E11.40 Type 2 diabetes mellitus with diabetic neuropathy, unspecified S61.301A Unspecified open wound of left index finger with damage to  nail, initial encounter S61.307A Unspecified open wound of  left little finger with damage to nail, initial encounter S61.300A Unspecified open wound of right index finger with damage to nail,  initial encounter Electronic Signature(s) Signed: 09/05/2015 11:15:37 AM By: [name] MD, FACS Entered By: [name] on 09/05/2015 11:15:36

## 2015-09-17 ENCOUNTER — Encounter: Payer: Medicare Other | Admitting: Physical Therapy

## 2015-09-17 ENCOUNTER — Ambulatory Visit: Payer: Medicare Other | Admitting: Physical Therapy

## 2015-09-24 ENCOUNTER — Ambulatory Visit: Payer: Medicare Other | Attending: Family Medicine

## 2015-09-24 DIAGNOSIS — R262 Difficulty in walking, not elsewhere classified: Secondary | ICD-10-CM | POA: Diagnosis not present

## 2015-09-24 NOTE — Therapy (Signed)
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 "twilight sleep" in past.  . Disorder of neurophysis (HCC)     cannot walk  . Personal history of colonic polyps - adenomas 02/15/2014    02/15/2014 2 small polyps , max 7 mm transverse colon    . Diabetes mellitus     type 2  . Hypotension   . DVT (deep venous thrombosis) (HCC) 05/2009  . Unable to bear weight   . Third degree burn     Hand  March- follwed at [facility]  . Diabetic foot ulcer (HCC) 06/13/2015  . Fungal infection 06/13/2015  . 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 Bite wound of finger 06/13/2015  . Osteomyelitis of finger (HCC) 08/15/2015    Past Surgical History  Procedure Laterality Date  . Cataract extraction Bilateral   . Toe amputation Right     Great toe and second toe  . Fistula surgery      multiple graft  . Knee arthroscopy Left   . Arteriovenous graft placement Left     lt. thigh at present  . Av fistula placement Right     right  upper arm  . Colonoscopy with propofol N/A 02/15/2014    Procedure: COLONOSCOPY WITH PROPOFOL;  Surgeon: Iva Boop, MD;  Location: [facility];  Service: Endoscopy;  Laterality: N/A;  . Breast surgery Left     removal of  precancerous.  Lumpectomy  . Toe amputation Left     2nd toe and 1/2 3rd toe  . Flair stent graft in lt leg      MR conditional 3T & 1.5T  3000-Gauss/cm or less  . Incision and drainage of wound Left 06/27/2015    Procedure: Irrigation and Debridement Left Middle finger,  Partial Amputation of left middle finger;  Surgeon: Knute Neu, MD;  Location: [facility];  Service: Plastics;  Laterality: Left;    There were no vitals filed for this visit.  Visit Diagnosis:  No diagnosis found.          [facility] PT Assessment - 09/24/15 0001    Balance Screen   Has the patient fallen in the past 6 months No   Has the patient had a decrease in activity level because of a fear of falling?  No   Is the patient reluctant to leave their home because  [facility] [address] [place], [place], [address] Phone: [phone]   Fax:  [phone]  Physical Therapy Evaluation  Patient Details  Name: [name] MRN: [id number] Date of Birth: Dec 10, 1965 No Data Recorded  Encounter Date: 09/24/2015    Past Medical History  Diagnosis Date  . Idiopathic parathyroidism (HCC)     secondary, hyper  . Lung nodule     left lung  . Sinus problem   . Neuropathy (HCC)   . Hyperkalemia   . Allergy     seasonal  . Cataract     surgery  . GERD (gastroesophageal reflux disease)   . Low blood pressure   . Neuromuscular disorder (HCC)   . Gait difficulty     "uses motor or standard wheelchair" -unsteady on feet.  . Arteriovenous fistula (HCC)     Rt. upper arm, 02-08-14 now has AV Goretex graft Lt. thigh  . ESRD (end stage renal disease) (HCC)     end stage, on dialysis since 05/2002. dry wt. 113 kg.M-W-F, [place] [address]  Marland Kitchen Complication of anesthesia     BP runs usually low. Had an issue with "twilight sleep" in past.  . Disorder of neurophysis (HCC)     cannot walk  . Personal history of colonic polyps - adenomas 02/15/2014    02/15/2014 2 small polyps , max 7 mm transverse colon    . Diabetes mellitus     type 2  . Hypotension   . DVT (deep venous thrombosis) (HCC) 05/2009  . Unable to bear weight   . Third degree burn     Hand  March- follwed at [facility]  . Diabetic foot ulcer (HCC) 06/13/2015  . Fungal infection 06/13/2015  . 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 Bite wound of finger 06/13/2015  . Osteomyelitis of finger (HCC) 08/15/2015    Past Surgical History  Procedure Laterality Date  . Cataract extraction Bilateral   . Toe amputation Right     Great toe and second toe  . Fistula surgery      multiple graft  . Knee arthroscopy Left   . Arteriovenous graft placement Left     lt. thigh at present  . Av fistula placement Right     right  upper arm  . Colonoscopy with propofol N/A 02/15/2014    Procedure: COLONOSCOPY WITH PROPOFOL;  Surgeon: Iva Boop, MD;  Location: [facility];  Service: Endoscopy;  Laterality: N/A;  . Breast surgery Left     removal of  precancerous.  Lumpectomy  . Toe amputation Left     2nd toe and 1/2 3rd toe  . Flair stent graft in lt leg      MR conditional 3T & 1.5T  3000-Gauss/cm or less  . Incision and drainage of wound Left 06/27/2015    Procedure: Irrigation and Debridement Left Middle finger,  Partial Amputation of left middle finger;  Surgeon: Knute Neu, MD;  Location: [facility];  Service: Plastics;  Laterality: Left;    There were no vitals filed for this visit.  Visit Diagnosis:  No diagnosis found.          [facility] PT Assessment - 09/24/15 0001    Balance Screen   Has the patient fallen in the past 6 months No   Has the patient had a decrease in activity level because of a fear of falling?  No   Is the patient reluctant to leave their home because  [facility] [address] [place], [place], [address] Phone: [phone]   Fax:  [phone]  Physical Therapy Evaluation  Patient Details  Name: [name] MRN: [id number] Date of Birth: Dec 10, 1965 No Data Recorded  Encounter Date: 09/24/2015    Past Medical History  Diagnosis Date  . Idiopathic parathyroidism (HCC)     secondary, hyper  . Lung nodule     left lung  . Sinus problem   . Neuropathy (HCC)   . Hyperkalemia   . Allergy     seasonal  . Cataract     surgery  . GERD (gastroesophageal reflux disease)   . Low blood pressure   . Neuromuscular disorder (HCC)   . Gait difficulty     "uses motor or standard wheelchair" -unsteady on feet.  . Arteriovenous fistula (HCC)     Rt. upper arm, 02-08-14 now has AV Goretex graft Lt. thigh  . ESRD (end stage renal disease) (HCC)     end stage, on dialysis since 05/2002. dry wt. 113 kg.M-W-F, [place] [address]  Marland Kitchen Complication of anesthesia     BP runs usually low. Had an issue with "twilight sleep" in past.  . Disorder of neurophysis (HCC)     cannot walk  . Personal history of colonic polyps - adenomas 02/15/2014    02/15/2014 2 small polyps , max 7 mm transverse colon    . Diabetes mellitus     type 2  . Hypotension   . DVT (deep venous thrombosis) (HCC) 05/2009  . Unable to bear weight   . Third degree burn     Hand  March- follwed at [facility]  . Diabetic foot ulcer (HCC) 06/13/2015  . Fungal infection 06/13/2015  . 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 Bite wound of finger 06/13/2015  . Osteomyelitis of finger (HCC) 08/15/2015    Past Surgical History  Procedure Laterality Date  . Cataract extraction Bilateral   . Toe amputation Right     Great toe and second toe  . Fistula surgery      multiple graft  . Knee arthroscopy Left   . Arteriovenous graft placement Left     lt. thigh at present  . Av fistula placement Right     right  upper arm  . Colonoscopy with propofol N/A 02/15/2014    Procedure: COLONOSCOPY WITH PROPOFOL;  Surgeon: Iva Boop, MD;  Location: [facility];  Service: Endoscopy;  Laterality: N/A;  . Breast surgery Left     removal of  precancerous.  Lumpectomy  . Toe amputation Left     2nd toe and 1/2 3rd toe  . Flair stent graft in lt leg      MR conditional 3T & 1.5T  3000-Gauss/cm or less  . Incision and drainage of wound Left 06/27/2015    Procedure: Irrigation and Debridement Left Middle finger,  Partial Amputation of left middle finger;  Surgeon: Knute Neu, MD;  Location: [facility];  Service: Plastics;  Laterality: Left;    There were no vitals filed for this visit.  Visit Diagnosis:  No diagnosis found.          [facility] PT Assessment - 09/24/15 0001    Balance Screen   Has the patient fallen in the past 6 months No   Has the patient had a decrease in activity level because of a fear of falling?  No   Is the patient reluctant to leave their home because  of a fear of falling?  Yes        PATIENT INFORMATION: Name: [name] DOB:     06/03/1966                 Sex:f Date seen:    09/24/15    Time: 400PM  Address: [address] [place] [address] Physician: Arnette Norris, MD This evaluation/justification form will serve as the LMN for the following suppliers: ___[facility]_______________________ Supplier: Contact Person: Art Buff Phone: [phone]   [facility], DPT Phone:   [phone]   Phone: [phone]    Spouse/Parent/Caregiver name:  Lanny Hurst Phone number: [phone] Insurance/Payer:   Medicare [place]   Reason for Referral:  Patient/Caregiver Goals:  Patient was seen for face-to-face evaluation for new power wheelchair.  Also present was    Almeta Monas, [employer] to discuss recommendations and wheelchair options.  Further paperwork was completed and sent to vendor.  Patient appears to qualify for power mobility device at this  time per objective findings.   MEDICAL HISTORY: Diagnosis:  ESRD                                   Primary Diagnosis:  Onset: 1994 Diagnosis: Pressure Ulcer   '[]'$ Progressive Disease Relevant past and future surgeries:  Multiple surgeries to R and LLE and R and L UE including digit amputations. Dialysis fistula and graphs on Bilateral UE. Multiple cardiac Caths  Height: 67in Weight: 104.5Kg Explain recent changes or trends in weight:  Slight weight loss  History including Falls:No falls within last 57mo Pt either performs slideboard transfers or dependent transfer via Husband.    HOME ENVIRONMENT: '[]'$ House  '[]'$ Condo/town home  '[x]'$ Apartment  '[]'$ Assisted Living    '[]'$ Lives Alone '[x]'$  Lives with Others                                                    Hours with caregiver:   '[]'$ Home is accessible to patient           Stairs      '[]'$ Yes '[x]'$  No     Ramp '[]'$ Yes '[x]'$ No Comments:  Level entry   COMMUNITY ADL: TRANSPORTATION: '[]'$ Car    '[x]'$ Van    '[]'$ Public Transportation    '[]'$ Adapted w/c Lift    '[]'$ Ambulance    '[]'$ Other:       '[]'$ Sits in wheelchair during transport  Employment/School:     Specific requirements pertaining to mobility                                                     Other:       [employer], general practiceis WC accessible with seat that comes out of van to allow slide board transfer. VLucianne Leihas hand controls                              FUNCTIONAL/SENSORY PROCESSING SKILLS:  Handedness:   '[x]'$ Right     '[]'$ Left    '[]'$ NA  Comments:  [facility] [address] [place], [place], [address] Phone: [phone]   Fax:  [phone]  Physical Therapy Evaluation  Patient Details  Name: [name] MRN: [id number] Date of Birth: Dec 10, 1965 No Data Recorded  Encounter Date: 09/24/2015    Past Medical History  Diagnosis Date  . Idiopathic parathyroidism (HCC)     secondary, hyper  . Lung nodule     left lung  . Sinus problem   . Neuropathy (HCC)   . Hyperkalemia   . Allergy     seasonal  . Cataract     surgery  . GERD (gastroesophageal reflux disease)   . Low blood pressure   . Neuromuscular disorder (HCC)   . Gait difficulty     "uses motor or standard wheelchair" -unsteady on feet.  . Arteriovenous fistula (HCC)     Rt. upper arm, 02-08-14 now has AV Goretex graft Lt. thigh  . ESRD (end stage renal disease) (HCC)     end stage, on dialysis since 05/2002. dry wt. 113 kg.M-W-F, [place] [address]  Marland Kitchen Complication of anesthesia     BP runs usually low. Had an issue with "twilight sleep" in past.  . Disorder of neurophysis (HCC)     cannot walk  . Personal history of colonic polyps - adenomas 02/15/2014    02/15/2014 2 small polyps , max 7 mm transverse colon    . Diabetes mellitus     type 2  . Hypotension   . DVT (deep venous thrombosis) (HCC) 05/2009  . Unable to bear weight   . Third degree burn     Hand  March- follwed at [facility]  . Diabetic foot ulcer (HCC) 06/13/2015  . Fungal infection 06/13/2015  . 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 Bite wound of finger 06/13/2015  . Osteomyelitis of finger (HCC) 08/15/2015    Past Surgical History  Procedure Laterality Date  . Cataract extraction Bilateral   . Toe amputation Right     Great toe and second toe  . Fistula surgery      multiple graft  . Knee arthroscopy Left   . Arteriovenous graft placement Left     lt. thigh at present  . Av fistula placement Right     right  upper arm  . Colonoscopy with propofol N/A 02/15/2014    Procedure: COLONOSCOPY WITH PROPOFOL;  Surgeon: Iva Boop, MD;  Location: [facility];  Service: Endoscopy;  Laterality: N/A;  . Breast surgery Left     removal of  precancerous.  Lumpectomy  . Toe amputation Left     2nd toe and 1/2 3rd toe  . Flair stent graft in lt leg      MR conditional 3T & 1.5T  3000-Gauss/cm or less  . Incision and drainage of wound Left 06/27/2015    Procedure: Irrigation and Debridement Left Middle finger,  Partial Amputation of left middle finger;  Surgeon: Knute Neu, MD;  Location: [facility];  Service: Plastics;  Laterality: Left;    There were no vitals filed for this visit.  Visit Diagnosis:  No diagnosis found.          [facility] PT Assessment - 09/24/15 0001    Balance Screen   Has the patient fallen in the past 6 months No   Has the patient had a decrease in activity level because of a fear of falling?  No   Is the patient reluctant to leave their home because  of a fear of falling?  Yes        PATIENT INFORMATION: Name: [name] DOB:     06/03/1966                 Sex:f Date seen:    09/24/15    Time: 400PM  Address: [address] [place] [address] Physician: Arnette Norris, MD This evaluation/justification form will serve as the LMN for the following suppliers: ___[facility]_______________________ Supplier: Contact Person: Art Buff Phone: [phone]   [facility], DPT Phone:   [phone]   Phone: [phone]    Spouse/Parent/Caregiver name:  Lanny Hurst Phone number: [phone] Insurance/Payer:   Medicare [place]   Reason for Referral:  Patient/Caregiver Goals:  Patient was seen for face-to-face evaluation for new power wheelchair.  Also present was    Almeta Monas, [employer] to discuss recommendations and wheelchair options.  Further paperwork was completed and sent to vendor.  Patient appears to qualify for power mobility device at this  time per objective findings.   MEDICAL HISTORY: Diagnosis:  ESRD                                   Primary Diagnosis:  Onset: 1994 Diagnosis: Pressure Ulcer   '[]'$ Progressive Disease Relevant past and future surgeries:  Multiple surgeries to R and LLE and R and L UE including digit amputations. Dialysis fistula and graphs on Bilateral UE. Multiple cardiac Caths  Height: 67in Weight: 104.5Kg Explain recent changes or trends in weight:  Slight weight loss  History including Falls:No falls within last 57mo Pt either performs slideboard transfers or dependent transfer via Husband.    HOME ENVIRONMENT: '[]'$ House  '[]'$ Condo/town home  '[x]'$ Apartment  '[]'$ Assisted Living    '[]'$ Lives Alone '[x]'$  Lives with Others                                                    Hours with caregiver:   '[]'$ Home is accessible to patient           Stairs      '[]'$ Yes '[x]'$  No     Ramp '[]'$ Yes '[x]'$ No Comments:  Level entry   COMMUNITY ADL: TRANSPORTATION: '[]'$ Car    '[x]'$ Van    '[]'$ Public Transportation    '[]'$ Adapted w/c Lift    '[]'$ Ambulance    '[]'$ Other:       '[]'$ Sits in wheelchair during transport  Employment/School:     Specific requirements pertaining to mobility                                                     Other:       V[employer], general practiceis WC accessible with seat that comes out of van to allow slide board transfer. VLucianne Leihas hand controls                              FUNCTIONAL/SENSORY PROCESSING SKILLS:  Handedness:   '[x]'$ Right     '[]'$ Left    '[]'$ NA  Comments:

## 2015-10-02 ENCOUNTER — Ambulatory Visit: Payer: Medicare Other | Admitting: Family Medicine

## 2015-10-08 ENCOUNTER — Ambulatory Visit (INDEPENDENT_AMBULATORY_CARE_PROVIDER_SITE_OTHER): Payer: Medicare Other | Admitting: Family Medicine

## 2015-10-08 ENCOUNTER — Encounter: Payer: Self-pay | Admitting: Family Medicine

## 2015-10-08 VITALS — BP 116/66 | Temp 97.4°F | Wt 238.1 lb

## 2015-10-08 DIAGNOSIS — Z993 Dependence on wheelchair: Secondary | ICD-10-CM

## 2015-10-08 NOTE — Progress Notes (Signed)
Pre visit review using our clinic review tool, if applicable. No additional management support is needed unless otherwise documented below in the visit note. 

## 2015-10-08 NOTE — Progress Notes (Signed)
Subjective:   Patient ID: [name], female    DOB: [birth year], 50 y.o.   MRN: [id number]  [name] is a pleasant 50 y.o. year old female with h/o ESR on HD, CAD, multiple amputations, sleep apnea who is wheelchair bound and who presents to clinic today with mobility  on 10/08/2015  HPI: Functional decline- now wheelchair bound all hours of the day except for when she is at HD.  Recent evalulation by PT- reviewed-Neurodegenerative disorder and pressure ulcers also recently ruptured tendons in her right hand.  Has been seen by hand surgeon.  Having more sacral wounds- asking for a cushion as well. Followed by wound center  Does the patient require and use a wheelchair to move around in the home?  yes  Does the patient have quadriplegia or a fixed hip angle?  no  Does the patient have a trunk or brace cast or other brace that requires a reclining back feature for positioning? no  Does the patient have excessive extensor tone of the trunk muscles?  no  Does the patient need to rest in a recumbent position two or more times a day? no  The patient has the following condition(s) that prevent a 90 degree flexion of the knee: renal failure, multiple amputations, neurodegenerative disorder and pressure ulcers  Does the patient have a need for arm height different than available using non-adjustable arms? no  How many hours per day does the patient usually spend in the  wheelchair? ( round up to the next hour )  16.  Is the patient able to adequately self-propel in the standard weight wheelchair?  no   The answers to the above questions were provided by:  Patient  Current Outpatient Prescriptions on File Prior to Visit  Medication Sig Dispense Refill  . aspirin EC 81 MG tablet Take 81 mg by mouth at bedtime.    . cinacalcet (SENSIPAR) 60 MG tablet Take 60 mg by mouth daily.      Marland Kitchen gabapentin (NEURONTIN) 100 MG tablet Take 100-200 mg by mouth 2 (two) times  daily. 200 mg  in the a.m. And 100 mg  at night    . insulin aspart (NOVOLOG FLEXPEN) 100 UNIT/ML FlexPen Inject 20 Units into the skin 3 (three) times daily with meals. And pen needles 3/day 10 pen 11  . LANTUS SOLOSTAR 100 UNIT/ML SOPN Inject 50 Units into the skin at bedtime.     Marland Kitchen loperamide (IMODIUM) 2 MG capsule Take 2 mg by mouth as needed for diarrhea or loose stools.    . midodrine (PROAMATINE) 10 MG tablet Take 20 mg by mouth 3 (three) times daily. Take two 10 mg tabs 3 times a day    . Multiple Vitamin (RENAL MULTIVITAMIN/ZINC) TABS Take 1 tablet by mouth at bedtime.     Marland Kitchen omeprazole (PRILOSEC) 20 MG capsule Take 20 mg by mouth at bedtime.     . Probiotic Product (ALIGN PO) Take 1 capsule by mouth at bedtime.     . sevelamer carbonate (RENVELA) 800 MG tablet Take 4,000 mg by mouth 3 (three) times daily with meals.     No current facility-administered medications on file prior to visit.    Allergies  Allergen Reactions  . Zosyn [Piperacillin Sod-Tazobactam So] Shortness Of Breath    Passed out  . Amitriptyline Other (See Comments)    sleepy  . Benadryl [Diphenhydramine Hcl (Sleep)] Other (See Comments)    LBP  . Benadryl [Diphenhydramine Hcl]  Drop in BP  . Morphine     Pt is unsure   . Percocet [Oxycodone-Acetaminophen] Nausea And Vomiting  . Tylenol [Acetaminophen] Other (See Comments)    LBP  . Wellbutrin [Bupropion] Other (See Comments)    Bites nails .  Hallucinations     Past Medical History  Diagnosis Date  . Idiopathic parathyroidism ([place])     secondary, hyper  . Lung nodule     left lung  . Sinus problem   . Neuropathy ([place])   . Hyperkalemia   . Allergy     seasonal  . Cataract     surgery  . GERD (gastroesophageal reflux disease)   . Low blood pressure   . Neuromuscular disorder ([place])   . Gait difficulty     "uses motor or standard wheelchair" -unsteady on feet.  . Arteriovenous fistula ([place])     Rt. upper arm, 02-08-14 now has AV Goretex  graft Lt. thigh  . ESRD (end stage renal disease) ([place])     end stage, on dialysis since 05/2002. dry wt. 113 kg.M-W-F, [place] [facility].  Marland Kitchen Complication of anesthesia     BP runs usually low. Had an issue with "twilight sleep" in past.  . Disorder of neurophysis ([place])     cannot walk  . Personal history of colonic polyps - adenomas 02/15/2014    02/15/2014 2 small polyps , max 7 mm transverse colon    . Diabetes mellitus     type 2  . Hypotension   . DVT (deep venous thrombosis) ([place]) 05/2009  . Unable to bear weight   . Third degree burn     Hand  March- [facility]oll[facility]  . Diabetic foot ulcer ([place]) 06/13/2015  . Fungal infection 06/13/2015  . 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 Bite wound of finger 06/13/2015  . Osteomyelitis of finger ([place]) 08/15/2015    Past Surgical History  Procedure Laterality Date  . Cataract extraction Bilateral   . Toe amputation Right     Great toe and second toe  . Fistula surgery      multiple graft  . Knee arthroscopy Left   . Arteriovenous graft placement Left     lt. thigh at present  . Av fistula placement Right     right upper arm  . Colonoscopy with propofol N/A 02/15/2014    Procedure: COLONOSCOPY WITH PROPOFOL;  Surgeon: Gatha Mayer, MD;  Location: [facility];  Service: Endoscopy;  Laterality: N/A;  . Breast surgery Left     removal of  precancerous.  Lumpectomy  . Toe amputation Left     2nd toe and 1/2 3rd toe  . Flair stent graft in lt leg      MR conditional 3T & 1.5T  3000-Gauss/cm or less  . Incision and drainage of wound Left 06/27/2015    Procedure: Irrigation and Debridement Left Middle finger,  Partial Amputation of left middle finger;  Surgeon: Dayna Barker, MD;  Location: [place];  Service: Plastics;  Laterality: Left;    Family History  Problem Relation Age of Onset  . Hypertension Mother   . Liver disease Mother   . Hypertension Father   . Colon polyps Father   . Heart disease Father   .  Diabetes Maternal Grandmother   . Diabetes Maternal Grandfather   . Diabetes Paternal Grandmother   . Diabetes Paternal Grandfather   . Colon cancer Neg Hx     Social History   Social History  .  Marital Status: Married    Spouse Name: N/A  . Number of Children: N/A  . Years of Education: N/A   Occupational History  . Disabled    Social History Main Topics  . Smoking status: Never Smoker   . Smokeless tobacco: Never Used  . Alcohol Use: Yes     Comment: occasionally  . Drug Use: No  . Sexual Activity: Not on file   Other Topics Concern  . Not on file   Social History Narrative   Lives with husband in [place].  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   The PMH, PSH, Social History, Family History, Medications, and allergies have been reviewed in [facility], and have been updated if relevant.   Review of Systems  HENT: Negative.   Musculoskeletal: Positive for gait problem.       No longer walking on her own  Skin: Positive for wound.  Neurological: Positive for weakness and numbness. Negative for tremors, syncope, facial asymmetry, speech difficulty, light-headedness and headaches.  All other systems reviewed and are negative.      Objective:    Pulse   Temp(Src) 97.4 F (36.3 C) (Oral)  Wt   SpO2    Physical Exam  Constitutional: She is oriented to person, place, and time. She appears well-developed and well-nourished. No distress.  Wheelchair bound  HENT:  Head: Normocephalic.  Cardiovascular: Normal rate.   Pulmonary/Chest: Effort normal.  Neurological: She is alert and oriented to person, place, and time. No cranial nerve deficit.  Skin: Skin is warm and dry.  Multiple amputations and skin ulcerations  Psychiatric: She has a normal mood and affect. Her behavior is normal. Judgment and thought content normal.  Nursing note and vitals reviewed.         Assessment & Plan:   Dependent for wheelchair mobility No  Follow-up on file.

## 2015-10-08 NOTE — Assessment & Plan Note (Signed)
Reviewed and agree with PT assessment and that she would benefit form a power wheelchair.

## 2015-11-07 ENCOUNTER — Other Ambulatory Visit: Payer: Self-pay | Admitting: Orthopedic Surgery
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
## 2015-11-08 NOTE — Progress Notes (Signed)
Chart reviewed by Dr Kalman Shan, due to patient's extensive medical history and the fact that she is wheelchair bound, she will be better served being done at main OR.

## 2015-11-11 ENCOUNTER — Ambulatory Visit (HOSPITAL_COMMUNITY): Admission: RE | Admit: 2015-11-11 | Payer: Medicare Other | Source: Ambulatory Visit | Admitting: Orthopedic Surgery

## 2015-11-11 ENCOUNTER — Inpatient Hospital Stay (HOSPITAL_COMMUNITY): Payer: Medicare Other | Admitting: Certified Registered Nurse Anesthetist

## 2015-11-11 ENCOUNTER — Encounter (HOSPITAL_COMMUNITY): Admission: EM | Disposition: E | Payer: Self-pay | Source: Home / Self Care | Attending: Family Medicine

## 2015-11-11 ENCOUNTER — Inpatient Hospital Stay (HOSPITAL_COMMUNITY): Payer: Medicare Other

## 2015-11-11 ENCOUNTER — Encounter (HOSPITAL_COMMUNITY): Payer: Self-pay | Admitting: Family Medicine

## 2015-11-11 ENCOUNTER — Other Ambulatory Visit: Payer: Self-pay

## 2015-11-11 ENCOUNTER — Emergency Department (HOSPITAL_COMMUNITY): Payer: Medicare Other

## 2015-11-11 ENCOUNTER — Inpatient Hospital Stay (HOSPITAL_COMMUNITY)
Admission: EM | Admit: 2015-11-11 | Discharge: 2015-11-30 | DRG: 853 | Disposition: E | Payer: Medicare Other | Attending: Internal Medicine | Admitting: Internal Medicine

## 2015-11-11 DIAGNOSIS — M86141 Other acute osteomyelitis, right hand: Secondary | ICD-10-CM | POA: Diagnosis not present

## 2015-11-11 DIAGNOSIS — A4102 Sepsis due to Methicillin resistant Staphylococcus aureus: Secondary | ICD-10-CM | POA: Diagnosis not present

## 2015-11-11 DIAGNOSIS — A419 Sepsis, unspecified organism: Principal | ICD-10-CM | POA: Diagnosis present

## 2015-11-11 DIAGNOSIS — Z515 Encounter for palliative care: Secondary | ICD-10-CM | POA: Insufficient documentation

## 2015-11-11 DIAGNOSIS — Z66 Do not resuscitate: Secondary | ICD-10-CM | POA: Diagnosis present

## 2015-11-11 DIAGNOSIS — Z8249 Family history of ischemic heart disease and other diseases of the circulatory system: Secondary | ICD-10-CM

## 2015-11-11 DIAGNOSIS — S68711S Complete traumatic transmetacarpal amputation of right hand, sequela: Secondary | ICD-10-CM | POA: Diagnosis not present

## 2015-11-11 DIAGNOSIS — E1165 Type 2 diabetes mellitus with hyperglycemia: Secondary | ICD-10-CM | POA: Diagnosis present

## 2015-11-11 DIAGNOSIS — I959 Hypotension, unspecified: Secondary | ICD-10-CM | POA: Diagnosis not present

## 2015-11-11 DIAGNOSIS — T368X5A Adverse effect of other systemic antibiotics, initial encounter: Secondary | ICD-10-CM | POA: Diagnosis not present

## 2015-11-11 DIAGNOSIS — Z794 Long term (current) use of insulin: Secondary | ICD-10-CM

## 2015-11-11 DIAGNOSIS — E0840 Diabetes mellitus due to underlying condition with diabetic neuropathy, unspecified: Secondary | ICD-10-CM | POA: Diagnosis present

## 2015-11-11 DIAGNOSIS — N2581 Secondary hyperparathyroidism of renal origin: Secondary | ICD-10-CM | POA: Diagnosis present

## 2015-11-11 DIAGNOSIS — I120 Hypertensive chronic kidney disease with stage 5 chronic kidney disease or end stage renal disease: Secondary | ICD-10-CM | POA: Diagnosis present

## 2015-11-11 DIAGNOSIS — Z86718 Personal history of other venous thrombosis and embolism: Secondary | ICD-10-CM

## 2015-11-11 DIAGNOSIS — E8889 Other specified metabolic disorders: Secondary | ICD-10-CM | POA: Diagnosis present

## 2015-11-11 DIAGNOSIS — Z993 Dependence on wheelchair: Secondary | ICD-10-CM

## 2015-11-11 DIAGNOSIS — K649 Unspecified hemorrhoids: Secondary | ICD-10-CM | POA: Diagnosis not present

## 2015-11-11 DIAGNOSIS — N186 End stage renal disease: Secondary | ICD-10-CM | POA: Diagnosis present

## 2015-11-11 DIAGNOSIS — B9689 Other specified bacterial agents as the cause of diseases classified elsewhere: Secondary | ICD-10-CM | POA: Diagnosis not present

## 2015-11-11 DIAGNOSIS — R911 Solitary pulmonary nodule: Secondary | ICD-10-CM | POA: Diagnosis present

## 2015-11-11 DIAGNOSIS — M79641 Pain in right hand: Secondary | ICD-10-CM | POA: Diagnosis not present

## 2015-11-11 DIAGNOSIS — Z89421 Acquired absence of other right toe(s): Secondary | ICD-10-CM

## 2015-11-11 DIAGNOSIS — Z6837 Body mass index (BMI) 37.0-37.9, adult: Secondary | ICD-10-CM | POA: Diagnosis not present

## 2015-11-11 DIAGNOSIS — L03114 Cellulitis of left upper limb: Secondary | ICD-10-CM | POA: Insufficient documentation

## 2015-11-11 DIAGNOSIS — K625 Hemorrhage of anus and rectum: Secondary | ICD-10-CM | POA: Diagnosis not present

## 2015-11-11 DIAGNOSIS — B373 Candidiasis of vulva and vagina: Secondary | ICD-10-CM | POA: Diagnosis not present

## 2015-11-11 DIAGNOSIS — H9190 Unspecified hearing loss, unspecified ear: Secondary | ICD-10-CM | POA: Diagnosis not present

## 2015-11-11 DIAGNOSIS — Z7982 Long term (current) use of aspirin: Secondary | ICD-10-CM

## 2015-11-11 DIAGNOSIS — R739 Hyperglycemia, unspecified: Secondary | ICD-10-CM | POA: Diagnosis present

## 2015-11-11 DIAGNOSIS — K6289 Other specified diseases of anus and rectum: Secondary | ICD-10-CM | POA: Diagnosis not present

## 2015-11-11 DIAGNOSIS — L089 Local infection of the skin and subcutaneous tissue, unspecified: Secondary | ICD-10-CM

## 2015-11-11 DIAGNOSIS — K5900 Constipation, unspecified: Secondary | ICD-10-CM | POA: Diagnosis present

## 2015-11-11 DIAGNOSIS — H9319 Tinnitus, unspecified ear: Secondary | ICD-10-CM | POA: Diagnosis not present

## 2015-11-11 DIAGNOSIS — I96 Gangrene, not elsewhere classified: Secondary | ICD-10-CM | POA: Insufficient documentation

## 2015-11-11 DIAGNOSIS — Z833 Family history of diabetes mellitus: Secondary | ICD-10-CM

## 2015-11-11 DIAGNOSIS — E119 Type 2 diabetes mellitus without complications: Secondary | ICD-10-CM

## 2015-11-11 DIAGNOSIS — Z992 Dependence on renal dialysis: Secondary | ICD-10-CM | POA: Diagnosis not present

## 2015-11-11 DIAGNOSIS — E0844 Diabetes mellitus due to underlying condition with diabetic amyotrophy: Secondary | ICD-10-CM | POA: Diagnosis not present

## 2015-11-11 DIAGNOSIS — D638 Anemia in other chronic diseases classified elsewhere: Secondary | ICD-10-CM | POA: Diagnosis present

## 2015-11-11 DIAGNOSIS — E1152 Type 2 diabetes mellitus with diabetic peripheral angiopathy with gangrene: Secondary | ICD-10-CM | POA: Diagnosis present

## 2015-11-11 DIAGNOSIS — R6520 Severe sepsis without septic shock: Secondary | ICD-10-CM | POA: Diagnosis present

## 2015-11-11 DIAGNOSIS — R5082 Postprocedural fever: Secondary | ICD-10-CM | POA: Diagnosis not present

## 2015-11-11 DIAGNOSIS — Z79899 Other long term (current) drug therapy: Secondary | ICD-10-CM | POA: Diagnosis not present

## 2015-11-11 DIAGNOSIS — I9589 Other hypotension: Secondary | ICD-10-CM | POA: Diagnosis present

## 2015-11-11 DIAGNOSIS — Z89411 Acquired absence of right great toe: Secondary | ICD-10-CM | POA: Diagnosis not present

## 2015-11-11 DIAGNOSIS — E1122 Type 2 diabetes mellitus with diabetic chronic kidney disease: Secondary | ICD-10-CM | POA: Diagnosis present

## 2015-11-11 DIAGNOSIS — Z888 Allergy status to other drugs, medicaments and biological substances status: Secondary | ICD-10-CM

## 2015-11-11 DIAGNOSIS — R4182 Altered mental status, unspecified: Secondary | ICD-10-CM | POA: Diagnosis not present

## 2015-11-11 DIAGNOSIS — Z86010 Personal history of colonic polyps: Secondary | ICD-10-CM

## 2015-11-11 DIAGNOSIS — R0602 Shortness of breath: Secondary | ICD-10-CM

## 2015-11-11 DIAGNOSIS — Z89422 Acquired absence of other left toe(s): Secondary | ICD-10-CM | POA: Diagnosis not present

## 2015-11-11 DIAGNOSIS — S61409A Unspecified open wound of unspecified hand, initial encounter: Secondary | ICD-10-CM

## 2015-11-11 DIAGNOSIS — Z9119 Patient's noncompliance with other medical treatment and regimen: Secondary | ICD-10-CM | POA: Diagnosis not present

## 2015-11-11 DIAGNOSIS — K219 Gastro-esophageal reflux disease without esophagitis: Secondary | ICD-10-CM | POA: Diagnosis present

## 2015-11-11 DIAGNOSIS — R0902 Hypoxemia: Secondary | ICD-10-CM

## 2015-11-11 DIAGNOSIS — I469 Cardiac arrest, cause unspecified: Secondary | ICD-10-CM | POA: Diagnosis not present

## 2015-11-11 DIAGNOSIS — B3731 Acute candidiasis of vulva and vagina: Secondary | ICD-10-CM

## 2015-11-11 DIAGNOSIS — Z885 Allergy status to narcotic agent status: Secondary | ICD-10-CM

## 2015-11-11 HISTORY — PX: MINOR AMPUTATION OF DIGIT: SHX6242

## 2015-11-11 LAB — COMPREHENSIVE METABOLIC PANEL
ALBUMIN: 2.7 g/dL — AB (ref 3.5–5.0)
ALT: 15 U/L (ref 14–54)
AST: 28 U/L (ref 15–41)
Alkaline Phosphatase: 280 U/L — ABNORMAL HIGH (ref 38–126)
Anion gap: 26 — ABNORMAL HIGH (ref 5–15)
BUN: 68 mg/dL — AB (ref 6–20)
CHLORIDE: 90 mmol/L — AB (ref 101–111)
CO2: 19 mmol/L — ABNORMAL LOW (ref 22–32)
Calcium: 7.6 mg/dL — ABNORMAL LOW (ref 8.9–10.3)
Creatinine, Ser: 6.92 mg/dL — ABNORMAL HIGH (ref 0.44–1.00)
GFR calc Af Amer: 7 mL/min — ABNORMAL LOW (ref >60)
GFR calc non Af Amer: 6 mL/min — ABNORMAL LOW (ref >60)
GLUCOSE: 382 mg/dL — AB (ref 65–99)
POTASSIUM: 4.7 mmol/L (ref 3.5–5.1)
Sodium: 135 mmol/L (ref 135–145)
Total Bilirubin: 1.1 mg/dL (ref 0.3–1.2)
Total Protein: 6.9 g/dL (ref 6.5–8.1)

## 2015-11-11 LAB — CBC WITH DIFFERENTIAL/PLATELET
Basophils Absolute: 0 10*3/uL (ref 0.0–0.1)
Basophils Relative: 0 %
EOS PCT: 0 %
Eosinophils Absolute: 0 10*3/uL (ref 0.0–0.7)
HEMATOCRIT: 37.6 % (ref 36.0–46.0)
Hemoglobin: 11 g/dL — ABNORMAL LOW (ref 12.0–15.0)
LYMPHS ABS: 1 10*3/uL (ref 0.7–4.0)
LYMPHS PCT: 5 %
MCH: 26.3 pg (ref 26.0–34.0)
MCHC: 29.3 g/dL — ABNORMAL LOW (ref 30.0–36.0)
MCV: 89.7 fL (ref 78.0–100.0)
MONO ABS: 1.2 10*3/uL — AB (ref 0.1–1.0)
Monocytes Relative: 6 %
Neutro Abs: 17 10*3/uL — ABNORMAL HIGH (ref 1.7–7.7)
Neutrophils Relative %: 89 %
PLATELETS: 213 10*3/uL (ref 150–400)
RBC: 4.19 MIL/uL (ref 3.87–5.11)
RDW: 18.6 % — AB (ref 11.5–15.5)
WBC: 19.3 10*3/uL — AB (ref 4.0–10.5)

## 2015-11-11 LAB — GLUCOSE, CAPILLARY
GLUCOSE-CAPILLARY: 310 mg/dL — AB (ref 65–99)
GLUCOSE-CAPILLARY: 262 mg/dL — AB (ref 65–99)

## 2015-11-11 LAB — APTT: APTT: 34 s (ref 24–37)

## 2015-11-11 LAB — CREATININE, SERUM
CREATININE: 6.9 mg/dL — AB (ref 0.44–1.00)
GFR calc Af Amer: 7 mL/min — ABNORMAL LOW (ref >60)
GFR calc non Af Amer: 6 mL/min — ABNORMAL LOW (ref >60)

## 2015-11-11 LAB — CBG MONITORING, ED: Glucose-Capillary: 386 mg/dL — ABNORMAL HIGH (ref 65–99)

## 2015-11-11 LAB — PROTIME-INR
INR: 1.39 (ref 0.00–1.49)
PROTHROMBIN TIME: 17.1 s — AB (ref 11.6–15.2)

## 2015-11-11 LAB — PROCALCITONIN: PROCALCITONIN: 19.05 ng/mL

## 2015-11-11 LAB — I-STAT CG4 LACTIC ACID, ED
Lactic Acid, Venous: 5.74 mmol/L (ref 0.5–2.0)
Lactic Acid, Venous: 3.33 mmol/L (ref 0.5–2.0)

## 2015-11-11 SURGERY — MINOR AMPUTATION OF DIGIT
Anesthesia: General | Site: Finger | Laterality: Bilateral

## 2015-11-11 MED ORDER — ALTEPLASE 2 MG IJ SOLR: 2.0000 mg | Freq: Once | INTRAMUSCULAR | Status: DC | PRN

## 2015-11-11 MED ORDER — RISAQUAD PO CAPS
1.0000 | ORAL_CAPSULE | Freq: Every day | ORAL | Status: DC
  Administered 2015-11-12 – 2015-11-24 (×9): 1 via ORAL
  Filled 2015-11-11 (×31): qty 1

## 2015-11-11 MED ORDER — ASPIRIN EC 81 MG PO TBEC
81.0000 mg | DELAYED_RELEASE_TABLET | Freq: Every day | ORAL | Status: DC
  Administered 2015-11-12 – 2015-11-21 (×7): 81 mg via ORAL
  Filled 2015-11-11 (×7): qty 1

## 2015-11-11 MED ORDER — MIDODRINE HCL 5 MG PO TABS
20.0000 mg | ORAL_TABLET | ORAL | Status: DC
  Administered 2015-11-11: 20 mg via ORAL
  Filled 2015-11-11: qty 4

## 2015-11-11 MED ORDER — INSULIN ASPART 100 UNIT/ML ~~LOC~~ SOLN
SUBCUTANEOUS | Status: AC
  Filled 2015-11-11: qty 5

## 2015-11-11 MED ORDER — INSULIN ASPART 100 UNIT/ML ~~LOC~~ SOLN
7.0000 [IU] | Freq: Once | SUBCUTANEOUS | Status: AC
  Administered 2015-11-11: 7 [IU] via SUBCUTANEOUS
  Filled 2015-11-11: qty 1

## 2015-11-11 MED ORDER — RENA-VITE PO TABS
1.0000 | ORAL_TABLET | Freq: Every day | ORAL | Status: DC
  Administered 2015-11-12 – 2015-11-21 (×8): 1 via ORAL
  Filled 2015-11-11 (×7): qty 1

## 2015-11-11 MED ORDER — ALBUMIN HUMAN 5 % IV SOLN
INTRAVENOUS | Status: AC
  Administered 2015-11-11: 12.5 g via INTRAVENOUS
  Filled 2015-11-11: qty 250

## 2015-11-11 MED ORDER — ONDANSETRON HCL 4 MG PO TABS: 4.0000 mg | ORAL_TABLET | Freq: Four times a day (QID) | ORAL | Status: DC | PRN

## 2015-11-11 MED ORDER — LOPERAMIDE HCL 2 MG PO CAPS: 2.0000 mg | ORAL_CAPSULE | ORAL | Status: DC | PRN

## 2015-11-11 MED ORDER — HYDROMORPHONE HCL 1 MG/ML IJ SOLN
0.2500 mg | INTRAMUSCULAR | Status: DC | PRN
  Administered 2015-11-11: 0.25 mg via INTRAVENOUS

## 2015-11-11 MED ORDER — GABAPENTIN 100 MG PO CAPS
200.0000 mg | ORAL_CAPSULE | Freq: Two times a day (BID) | ORAL | Status: DC
  Administered 2015-11-12 – 2015-11-19 (×9): 200 mg via ORAL
  Filled 2015-11-11 (×13): qty 2

## 2015-11-11 MED ORDER — MIDODRINE HCL 5 MG PO TABS: 20.0000 mg | ORAL_TABLET | Freq: Three times a day (TID) | ORAL | Status: DC

## 2015-11-11 MED ORDER — LIDOCAINE-PRILOCAINE 2.5-2.5 % EX CREA: 1.0000 "application " | TOPICAL_CREAM | CUTANEOUS | Status: DC | PRN

## 2015-11-11 MED ORDER — SUCCINYLCHOLINE CHLORIDE 20 MG/ML IJ SOLN
INTRAMUSCULAR | Status: DC | PRN
  Administered 2015-11-11: 100 mg via INTRAVENOUS

## 2015-11-11 MED ORDER — VANCOMYCIN HCL 10 G IV SOLR
2500.0000 mg | Freq: Once | INTRAVENOUS | Status: AC
  Administered 2015-11-11: 2500 mg via INTRAVENOUS
  Filled 2015-11-11: qty 2500

## 2015-11-11 MED ORDER — INSULIN GLARGINE 100 UNIT/ML ~~LOC~~ SOLN
40.0000 [IU] | Freq: Every day | SUBCUTANEOUS | Status: DC
  Filled 2015-11-11 (×2): qty 0.4

## 2015-11-11 MED ORDER — INSULIN ASPART 100 UNIT/ML ~~LOC~~ SOLN
SUBCUTANEOUS | Status: AC
  Filled 2015-11-11: qty 1

## 2015-11-11 MED ORDER — MIDODRINE HCL 5 MG PO TABS: 20.0000 mg | ORAL_TABLET | ORAL | Status: DC

## 2015-11-11 MED ORDER — LIDOCAINE HCL (PF) 1 % IJ SOLN
INTRAMUSCULAR | Status: AC
  Filled 2015-11-11: qty 30

## 2015-11-11 MED ORDER — SODIUM CHLORIDE 0.9 % IV SOLN
100.0000 mL | INTRAVENOUS | Status: DC | PRN
Start: 2015-11-11 — End: 2015-11-12

## 2015-11-11 MED ORDER — PHENYLEPHRINE HCL 10 MG/ML IJ SOLN
10.0000 mg | INTRAVENOUS | Status: DC | PRN
  Administered 2015-11-11: 20 ug/min via INTRAVENOUS

## 2015-11-11 MED ORDER — PANTOPRAZOLE SODIUM 40 MG PO TBEC
40.0000 mg | DELAYED_RELEASE_TABLET | Freq: Every day | ORAL | Status: DC
  Administered 2015-11-11 – 2015-11-24 (×10): 40 mg via ORAL
  Filled 2015-11-11 (×11): qty 1

## 2015-11-11 MED ORDER — SODIUM CHLORIDE 0.9 % IV SOLN
INTRAVENOUS | Status: DC
  Administered 2015-11-11 (×3): via INTRAVENOUS

## 2015-11-11 MED ORDER — HEPARIN SODIUM (PORCINE) 5000 UNIT/ML IJ SOLN: 5000.0000 [IU] | Freq: Three times a day (TID) | INTRAMUSCULAR | Status: DC

## 2015-11-11 MED ORDER — FENTANYL CITRATE (PF) 250 MCG/5ML IJ SOLN
INTRAMUSCULAR | Status: DC | PRN
  Administered 2015-11-11: 50 ug via INTRAVENOUS

## 2015-11-11 MED ORDER — 0.9 % SODIUM CHLORIDE (POUR BTL) OPTIME
TOPICAL | Status: DC | PRN
  Administered 2015-11-11: 1000 mL

## 2015-11-11 MED ORDER — BACITRACIN ZINC 500 UNIT/GM EX OINT
TOPICAL_OINTMENT | CUTANEOUS | Status: AC
  Filled 2015-11-11: qty 28.35

## 2015-11-11 MED ORDER — BUPIVACAINE-EPINEPHRINE (PF) 0.5% -1:200000 IJ SOLN
INTRAMUSCULAR | Status: AC
  Filled 2015-11-11: qty 30

## 2015-11-11 MED ORDER — MIDAZOLAM HCL 5 MG/5ML IJ SOLN
INTRAMUSCULAR | Status: DC | PRN
  Administered 2015-11-11: 2 mg via INTRAVENOUS

## 2015-11-11 MED ORDER — HYDROMORPHONE HCL 1 MG/ML IJ SOLN
INTRAMUSCULAR | Status: AC
  Filled 2015-11-11: qty 1

## 2015-11-11 MED ORDER — MIDODRINE HCL 5 MG PO TABS
20.0000 mg | ORAL_TABLET | ORAL | Status: DC
  Filled 2015-11-11: qty 4

## 2015-11-11 MED ORDER — ALBUMIN HUMAN 5 % IV SOLN
INTRAVENOUS | Status: DC | PRN
  Administered 2015-11-11: 18:00:00 via INTRAVENOUS

## 2015-11-11 MED ORDER — SODIUM CHLORIDE 0.9 % IV SOLN
1500.0000 mg | INTRAVENOUS | Status: DC
  Filled 2015-11-11: qty 1500

## 2015-11-11 MED ORDER — SODIUM CHLORIDE 0.9 % IV SOLN: 100.0000 mL | INTRAVENOUS | Status: DC | PRN

## 2015-11-11 MED ORDER — SODIUM CHLORIDE 0.9 % IV BOLUS (SEPSIS)
1000.0000 mL | Freq: Once | INTRAVENOUS | Status: AC
  Administered 2015-11-11: 1000 mL via INTRAVENOUS

## 2015-11-11 MED ORDER — LIDOCAINE HCL (PF) 1 % IJ SOLN: 5.0000 mL | INTRAMUSCULAR | Status: DC | PRN

## 2015-11-11 MED ORDER — PENTAFLUOROPROP-TETRAFLUOROETH EX AERO: 1.0000 "application " | INHALATION_SPRAY | CUTANEOUS | Status: DC | PRN

## 2015-11-11 MED ORDER — ONDANSETRON HCL 4 MG/2ML IJ SOLN
4.0000 mg | Freq: Four times a day (QID) | INTRAMUSCULAR | Status: DC | PRN
  Administered 2015-11-18: 4 mg via INTRAVENOUS

## 2015-11-11 MED ORDER — PROPOFOL 10 MG/ML IV BOLUS
INTRAVENOUS | Status: DC | PRN
  Administered 2015-11-11: 160 mg via INTRAVENOUS

## 2015-11-11 MED ORDER — SEVELAMER CARBONATE 800 MG PO TABS
4000.0000 mg | ORAL_TABLET | Freq: Three times a day (TID) | ORAL | Status: DC
  Administered 2015-11-12 – 2015-11-21 (×12): 4000 mg via ORAL
  Filled 2015-11-11 (×16): qty 5

## 2015-11-11 MED ORDER — ONDANSETRON HCL 4 MG/2ML IJ SOLN
INTRAMUSCULAR | Status: DC | PRN
  Administered 2015-11-11: 4 mg via INTRAVENOUS

## 2015-11-11 MED ORDER — ALBUMIN HUMAN 5 % IV SOLN
12.5000 g | Freq: Once | INTRAVENOUS | Status: AC
  Administered 2015-11-11: 12.5 g via INTRAVENOUS

## 2015-11-11 MED ORDER — INSULIN ASPART 100 UNIT/ML ~~LOC~~ SOLN
8.0000 [IU] | Freq: Once | SUBCUTANEOUS | Status: AC
  Administered 2015-11-11: 8 [IU] via SUBCUTANEOUS

## 2015-11-11 MED ORDER — INSULIN ASPART 100 UNIT/ML ~~LOC~~ SOLN
0.0000 [IU] | Freq: Every day | SUBCUTANEOUS | Status: DC
  Administered 2015-11-12: 3 [IU] via SUBCUTANEOUS
  Administered 2015-11-13: 2 [IU] via SUBCUTANEOUS
  Administered 2015-11-14: 3 [IU] via SUBCUTANEOUS
  Administered 2015-11-15: 2 [IU] via SUBCUTANEOUS
  Administered 2015-11-16: 3 [IU] via SUBCUTANEOUS

## 2015-11-11 MED ORDER — LACTATED RINGERS IV SOLN: INTRAVENOUS | Status: DC

## 2015-11-11 MED ORDER — VANCOMYCIN HCL IN DEXTROSE 1-5 GM/200ML-% IV SOLN
1000.0000 mg | INTRAVENOUS | Status: AC
  Administered 2015-11-12: 1000 mg via INTRAVENOUS

## 2015-11-11 MED ORDER — INSULIN ASPART 100 UNIT/ML ~~LOC~~ SOLN
0.0000 [IU] | Freq: Three times a day (TID) | SUBCUTANEOUS | Status: DC
  Administered 2015-11-11: 5 [IU] via SUBCUTANEOUS
  Administered 2015-11-12: 7 [IU] via SUBCUTANEOUS
  Administered 2015-11-12: 2 [IU] via SUBCUTANEOUS
  Administered 2015-11-12: 7 [IU] via SUBCUTANEOUS
  Administered 2015-11-13: 9 [IU] via SUBCUTANEOUS
  Administered 2015-11-14: 7 [IU] via SUBCUTANEOUS
  Administered 2015-11-14: 3 [IU] via SUBCUTANEOUS
  Administered 2015-11-15: 5 [IU] via SUBCUTANEOUS
  Administered 2015-11-15: 7 [IU] via SUBCUTANEOUS
  Administered 2015-11-15: 5 [IU] via SUBCUTANEOUS
  Administered 2015-11-16: 3 [IU] via SUBCUTANEOUS
  Administered 2015-11-17: 2 [IU] via SUBCUTANEOUS
  Administered 2015-11-17 (×2): 3 [IU] via SUBCUTANEOUS
  Administered 2015-11-20: 1 [IU] via SUBCUTANEOUS
  Administered 2015-11-23 (×2): 2 [IU] via SUBCUTANEOUS
  Administered 2015-11-23: 3 [IU] via SUBCUTANEOUS
  Administered 2015-11-24: 1 [IU] via SUBCUTANEOUS
  Administered 2015-11-24 – 2015-11-25 (×2): 2 [IU] via SUBCUTANEOUS

## 2015-11-11 MED ORDER — EPHEDRINE SULFATE 50 MG/ML IJ SOLN
INTRAMUSCULAR | Status: DC | PRN
  Administered 2015-11-11 (×2): 10 mg via INTRAVENOUS

## 2015-11-11 MED ORDER — LIDOCAINE HCL (CARDIAC) 20 MG/ML IV SOLN
INTRAVENOUS | Status: DC | PRN
  Administered 2015-11-11: 60 mg via INTRAVENOUS

## 2015-11-11 MED ORDER — CINACALCET HCL 30 MG PO TABS
60.0000 mg | ORAL_TABLET | Freq: Every day | ORAL | Status: DC
  Administered 2015-11-12 – 2015-11-17 (×3): 60 mg via ORAL
  Filled 2015-11-11 (×6): qty 2

## 2015-11-11 MED ORDER — DOXERCALCIFEROL 0.5 MCG PO CAPS
0.5000 ug | ORAL_CAPSULE | ORAL | Status: DC
  Administered 2015-11-13: 0.5 ug via ORAL
  Filled 2015-11-11 (×3): qty 1

## 2015-11-11 MED ORDER — INSULIN ASPART 100 UNIT/ML ~~LOC~~ SOLN
4.0000 [IU] | Freq: Three times a day (TID) | SUBCUTANEOUS | Status: DC
  Administered 2015-11-12 – 2015-11-20 (×14): 4 [IU] via SUBCUTANEOUS

## 2015-11-11 MED ORDER — HEPARIN SODIUM (PORCINE) 1000 UNIT/ML DIALYSIS: 1000.0000 [IU] | INTRAMUSCULAR | Status: DC | PRN

## 2015-11-11 SURGICAL SUPPLY — 47 items
BANDAGE COBAN STERILE 2 (GAUZE/BANDAGES/DRESSINGS) IMPLANT
BLADE AVERAGE 25MMX9MM (BLADE)
BLADE AVERAGE 25X9 (BLADE) IMPLANT
BNDG CMPR 9X4 STRL LF SNTH (GAUZE/BANDAGES/DRESSINGS) ×1
BNDG COHESIVE 1X5 TAN STRL LF (GAUZE/BANDAGES/DRESSINGS) ×2 IMPLANT
BNDG COHESIVE 4X5 TAN NS LF (GAUZE/BANDAGES/DRESSINGS) IMPLANT
BNDG CONFORM 2 STRL LF (GAUZE/BANDAGES/DRESSINGS) ×4 IMPLANT
BNDG CONFORM 3 STRL LF (GAUZE/BANDAGES/DRESSINGS) ×3 IMPLANT
BNDG ELASTIC 2X5.8 VLCR STR LF (GAUZE/BANDAGES/DRESSINGS) IMPLANT
BNDG ESMARK 4X9 LF (GAUZE/BANDAGES/DRESSINGS) ×2 IMPLANT
BNDG GAUZE ELAST 4 BULKY (GAUZE/BANDAGES/DRESSINGS) ×4 IMPLANT
CHLORAPREP W/TINT 26ML (MISCELLANEOUS) ×1 IMPLANT
CORDS BIPOLAR (ELECTRODE) ×3 IMPLANT
DRAPE EXTREMITY T 121X128X90 (DRAPE) ×2 IMPLANT
DRAPE SURG 17X23 STRL (DRAPES) ×3 IMPLANT
DRSG EMULSION OIL 3X3 NADH (GAUZE/BANDAGES/DRESSINGS) ×5 IMPLANT
ELECT REM PT RETURN 9FT ADLT (ELECTROSURGICAL) ×3
ELECTRODE REM PT RTRN 9FT ADLT (ELECTROSURGICAL) ×1 IMPLANT
GAUZE SPONGE 2X2 8PLY STRL LF (GAUZE/BANDAGES/DRESSINGS) IMPLANT
GAUZE SPONGE 4X4 12PLY STRL (GAUZE/BANDAGES/DRESSINGS) ×3 IMPLANT
GAUZE SPONGE 4X4 16PLY XRAY LF (GAUZE/BANDAGES/DRESSINGS) ×2 IMPLANT
GAUZE XEROFORM 1X8 LF (GAUZE/BANDAGES/DRESSINGS) ×3 IMPLANT
GLOVE BIO SURGEON STRL SZ7.5 (GLOVE) ×3 IMPLANT
GLOVE BIOGEL PI IND STRL 8 (GLOVE) ×1 IMPLANT
GLOVE BIOGEL PI INDICATOR 8 (GLOVE) ×2
GOWN STRL REUS W/ TWL XL LVL3 (GOWN DISPOSABLE) ×1 IMPLANT
GOWN STRL REUS W/TWL XL LVL3 (GOWN DISPOSABLE) ×3
IV CATH 14GX2 1/4 (CATHETERS) ×2 IMPLANT
KIT BASIN OR (CUSTOM PROCEDURE TRAY) ×3 IMPLANT
NDL HYPO 25X1 1.5 SAFETY (NEEDLE) IMPLANT
NEEDLE HYPO 25X1 1.5 SAFETY (NEEDLE) IMPLANT
PACK ORTHO EXTREMITY (CUSTOM PROCEDURE TRAY) ×3 IMPLANT
PAD CAST 4YDX4 CTTN HI CHSV (CAST SUPPLIES) IMPLANT
PADDING CAST ABS 4INX4YD NS (CAST SUPPLIES) ×2
PADDING CAST ABS COTTON 4X4 ST (CAST SUPPLIES) ×1 IMPLANT
PADDING CAST COTTON 4X4 STRL (CAST SUPPLIES) ×3
PENCIL BUTTON HOLSTER BLD 10FT (ELECTRODE) IMPLANT
SPONGE GAUZE 2X2 STER 10/PKG (GAUZE/BANDAGES/DRESSINGS) ×2
STOCKINETTE 4X48 STRL (DRAPES) ×4 IMPLANT
SUT ETHILON 4 0 FS 1 (SUTURE) ×4 IMPLANT
SUT ETHILON 4 0 PS 2 18 (SUTURE) IMPLANT
SUT MNCRL AB 4-0 PS2 18 (SUTURE) IMPLANT
SUT VIC AB 4-0 PS2 27 (SUTURE) ×2 IMPLANT
SUT VICRYL RAPIDE 4/0 PS 2 (SUTURE) IMPLANT
SYRINGE 10CC LL (SYRINGE) IMPLANT
TOWEL OR 17X24 6PK STRL BLUE (TOWEL DISPOSABLE) ×3 IMPLANT
UNDERPAD 30X30 INCONTINENT (UNDERPADS AND DIAPERS) ×3 IMPLANT

## 2015-11-11 NOTE — ED Notes (Signed)
Pt here for multiple finger infections. sts scheduled for surgery tomorrow to have multiple amputations.  Pt fingers black and sts have been oozing. Pt hypotensive at triage and sts fever 101 at home. 99.8 here.

## 2015-11-11 NOTE — Progress Notes (Signed)
D report given [id number] RN Janett Billow

## 2015-11-11 NOTE — ED Notes (Signed)
Patient transported to X-ray 

## 2015-11-11 NOTE — Anesthesia Postprocedure Evaluation (Signed)
Anesthesia Post Note  Patient: [name]  Procedure(s) Performed: Procedure(s) (LRB): PARTIAL AMPUTATION OF LEFT THUMB, LEFT INDEX, RIGHT RING FINGER, AND RIGHT SMALL FINGER DEBRIDEMENT,RIGHT FLEXORTENDONECTOMY, RIGHT WRIST DEBRIDEMENT (Bilateral)  Patient location during evaluation: PACU Anesthesia Type: General Level of consciousness: sedated Pain management: pain level controlled Vital Signs Assessment: post-procedure vital signs reviewed and stable Respiratory status: spontaneous breathing and respiratory function stable Cardiovascular status: stable Anesthetic complications: no    Last Vitals:  Filed Vitals:   11/20/2015 2015 11/22/2015 2030  BP: 91/42 104/41  Pulse: 95 95  Temp:    Resp: 25 21    Last Pain:  Filed Vitals:   11/10/2015 2037  PainSc: 0-No pain                 Kemani Heidel DANIEL

## 2015-11-11 NOTE — Anesthesia Preprocedure Evaluation (Addendum)
Anesthesia Evaluation  Patient identified by MRN, date of birth, ID band Patient awake    Reviewed: Allergy & Precautions, H&P , NPO status , Patient's Chart, lab work & pertinent test results  Airway Mallampati: II  TM Distance: >3 FB Neck ROM: Full    Dental no notable dental hx. (+) Teeth Intact, Dental Advisory Given   Pulmonary neg pulmonary ROS,    Pulmonary exam normal breath sounds clear to auscultation       Cardiovascular negative cardio ROS   Rhythm:Regular Rate:Normal     Neuro/Psych negative neurological ROS  negative psych ROS   GI/Hepatic Neg liver ROS, GERD  Medicated and Controlled,  Endo/Other  diabetes, Type 1, Insulin DependentMorbid obesity  Renal/GU ESRF and DialysisRenal disease  negative genitourinary   Musculoskeletal   Abdominal   Peds  Hematology negative hematology ROS (+)   Anesthesia Other Findings   Reproductive/Obstetrics negative OB ROS                            Anesthesia Physical Anesthesia Plan  ASA: III  Anesthesia Plan: General   Post-op Pain Management:    Induction: Intravenous  Airway Management Planned: LMA and Oral ETT  Additional Equipment:   Intra-op Plan:   Post-operative Plan: Extubation in OR  Informed Consent: I have reviewed the patients History and Physical, chart, labs and discussed the procedure including the risks, benefits and alternatives for the proposed anesthesia with the patient or authorized representative who has indicated his/her understanding and acceptance.   Dental advisory given  Plan Discussed with: CRNA  Anesthesia Plan Comments:         Anesthesia Quick Evaluation

## 2015-11-11 NOTE — Anesthesia Procedure Notes (Signed)
Procedure Name: Intubation Date/Time: 10/31/2015 5:49 PM Performed by: Ollen Bowl Pre-anesthesia Checklist: Patient identified, Emergency Drugs available, Suction available, Patient being monitored and Timeout performed Patient Re-evaluated:Patient Re-evaluated prior to inductionOxygen Delivery Method: Circle system utilized and Simple face mask Preoxygenation: Pre-oxygenation with 100% oxygen Intubation Type: IV induction Ventilation: Mask ventilation without difficulty Laryngoscope Size: Miller and 2 Grade View: Grade I Tube type: Oral Tube size: 7.5 mm Number of attempts: 1 Airway Equipment and Method: Patient positioned with wedge pillow and Stylet Placement Confirmation: ETT inserted through vocal cords under direct vision,  positive ETCO2 and breath sounds checked- equal and bilateral Secured at: 21 cm Tube secured with: Tape Dental Injury: Teeth and Oropharynx as per pre-operative assessment
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
## 2015-11-11 NOTE — Progress Notes (Signed)
Noted patient is still in OR. Called HD unit and spoke with Zenia Resides- patient is on list for hemo tonight.  Entered 1 time dose for vancomycin 1g IV to be given after HD tonight. Pharmacy will continue to monitor.  Itali Mckendry D. Lindora Alviar, PharmD, BCPS Clinical Pharmacist Pager: [phone] 11/10/2015 8:32 PM

## 2015-11-11 NOTE — Progress Notes (Signed)
Pharmacy Antibiotic Note  [name] is a 50 y.o. female admitted on 11/08/2015 with wound infection.  Pharmacy has been consulted for vancomycin dosing. Scheduled for multiple partial amputations on R hand 3/14 by Dr. Grandville Silos. ESRD on HD MWF outpatient. Will d/c vanc for surgical ppx ordered prior to surgery tomorrow, as patient should be already covered with vanc. Afeb, labs pending.  Plan: Vanc 2500mg  IV x 1 D/c vanc x1 for surgical ppx for 3/14 Monitor clinical progress, c/s, renal function, abx plan/LOT Pre-Hd vanc levels as indicated F/u HD schedule inpatient for abx maintenance doses    Temp (24hrs), Avg:99.8 F (37.7 C), Min:99.8 F (37.7 C), Max:99.8 F (37.7 C)  No results for input(s): WBC, CREATININE, LATICACIDVEN, VANCOTROUGH, VANCOPEAK, VANCORANDOM, GENTTROUGH, GENTPEAK, GENTRANDOM, TOBRATROUGH, TOBRAPEAK, TOBRARND, AMIKACINPEAK, AMIKACINTROU, AMIKACIN in the last 168 hours.  CrCl cannot be calculated (Unknown ideal weight.).    Allergies  Allergen Reactions  . Zosyn [Piperacillin Sod-Tazobactam So] Anaphylaxis and Shortness Of Breath    Passed out  . Benadryl [Diphenhydramine Hcl (Sleep)] Other (See Comments)    HYPOTENSION  . Wellbutrin [Bupropion] Other (See Comments)    HALLUCINATIONS "Bites Nails"   . Morphine Other (See Comments)    UNSPECIFIED Rx During Surgery  . Tylenol [Acetaminophen] Other (See Comments)    HYPOTENSION  . Amitriptyline Other (See Comments)    Sleepy  . Percocet [Oxycodone-Acetaminophen] Nausea And Vomiting    Antimicrobials this admission: 3/13 vanc >>   Dose adjustments this admission: n/a  Microbiology results: 3/13 BCx:   Elicia Lamp, PharmD, BCPS Clinical Pharmacist Pager [phone] 11/06/2015 11:10 AM
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
## 2015-11-11 NOTE — ED Provider Notes (Signed)
CSN: [id number]     Arrival date & time 11/14/2015  [id number] History   First MD Initiated Contact with Patient 11/01/2015 1049     Chief Complaint  Patient presents with  . Fever  . Wound Infection     (Consider location/radiation/quality/duration/timing/severity/associated sxs/prior Treatment) HPI Comments: Patient presents with complaint of worsening finger infections in her right hand. Patient is scheduled for multiple partial amputations on the hand tomorrow by Dr. Grandville Silos. Over the past 2-3 days, patient has noted worsening swelling, redness, drainage from her hand with warmth extending up her arm. Patient has had fever to 101 at home. She is due for dialysis today. Patient has several areas of eschar. No nausea, vomiting, or diarrhea. Dialysis access left lower extremity. No other treatments prior to arrival. Onset of symptoms acute. Course is worsening. Nothing makes symptoms better or worse.  Patient is a 50 y.o. female presenting with fever. The history is provided by the patient.  Fever Associated symptoms: no chest pain, no cough, no diarrhea, no dysuria, no headaches, no myalgias, no nausea, no rash, no rhinorrhea, no sore throat and no vomiting     Past Medical History  Diagnosis Date  . 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 Lung nodule     left lung  . Sinus problem   . Neuropathy ([place])   . Hyperkalemia   . Allergy     seasonal  . Cataract     surgery  . GERD (gastroesophageal reflux disease)   . Low blood pressure   . Neuromuscular disorder ([place])   . Gait difficulty     "uses motor or standard wheelchair" -unsteady on feet.  . Arteriovenous fistula ([place])     Rt. upper arm, 02-08-14 now has AV Goretex graft Lt. thigh  . ESRD (end stage renal disease) ([place])     end stage, on dialysis since 05/2002. dry wt. 113 kg.M-W-F, [place] [facility].  Marland Kitchen Complication of anesthesia     BP runs usually low. Had an issue with "twilight sleep" in past.  .  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 Personal history of colonic polyps - adenomas 02/15/2014    02/15/2014 2 small polyps , max 7 mm transverse colon    . Diabetes mellitus     type 2  . Hypotension   . DVT (deep venous thrombosis) ([place]) 05/2009  . Unable to bear weight   . Third degree burn     Hand  March- follwed at [facility]  . Diabetic foot ulcer ([place]) 06/13/2015  . Fungal infection 06/13/2015  . 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 Bite wound of finger 06/13/2015  . Osteomyelitis of finger ([place]) 08/15/2015   Past Surgical History  Procedure Laterality Date  . Cataract extraction Bilateral   . Toe amputation Right     Great toe and second toe  . Fistula surgery      multiple graft  . Knee arthroscopy Left   . Arteriovenous graft placement Left     lt. thigh at present  . Av fistula placement Right     right upper arm  . Colonoscopy with propofol N/A 02/15/2014    Procedure: COLONOSCOPY WITH PROPOFOL;  Surgeon: Gatha Mayer, MD;  Location: [facility];  Service: Endoscopy;  Laterality: N/A;  . Breast surgery Left     removal of  precancerous.  Lumpectomy  . Toe amputation Left     2nd toe and 1/2  3rd toe  . Flair stent graft in lt leg      MR conditional 3T & 1.5T  3000-Gauss/cm or less  . Incision and drainage of wound Left 06/27/2015    Procedure: Irrigation and Debridement Left Middle finger,  Partial Amputation of left middle finger;  Surgeon: Dayna Barker, MD;  Location: [place];  Service: Plastics;  Laterality: Left;   Family History  Problem Relation Age of Onset  . Hypertension Mother   . Liver disease Mother   . Hypertension Father   . Colon polyps Father   . Heart disease Father   . Diabetes Maternal Grandmother   . Diabetes Maternal Grandfather   . Diabetes Paternal Grandmother   . Diabetes Paternal Grandfather   . Colon cancer Neg Hx    Social History  Substance Use Topics  . Smoking status: Never Smoker   . Smokeless tobacco:  Never Used  . Alcohol Use: Yes     Comment: occasionally   OB History    No data available     Review of Systems  Constitutional: Positive for fever.  HENT: Negative for rhinorrhea and sore throat.   Eyes: Negative for redness.  Respiratory: Negative for cough.   Cardiovascular: Negative for chest pain.  Gastrointestinal: Negative for nausea, vomiting, abdominal pain and diarrhea.  Genitourinary: Negative for dysuria.  Musculoskeletal: Negative for myalgias.  Skin: Positive for wound. Negative for rash.  Neurological: Negative for headaches.      Allergies  Zosyn; Benadryl; Wellbutrin; Morphine; Tylenol; Amitriptyline; and Percocet  Home Medications   Prior to Admission medications   Medication Sig Start Date End Date Taking? Authorizing Provider  aspirin EC 81 MG tablet Take 81 mg by mouth at bedtime.    Historical Provider, MD  cinacalcet (SENSIPAR) 60 MG tablet Take 60 mg by mouth daily.      Historical Provider, MD  gabapentin (NEURONTIN) 100 MG tablet Take 100-200 mg by mouth 2 (two) times daily. 200 mg  in the a.m. And 100 mg  at night    Historical Provider, MD  insulin aspart (NOVOLOG FLEXPEN) 100 UNIT/ML FlexPen Inject 20 Units into the skin 3 (three) times daily with meals. And pen needles 3/day 07/03/15   Geradine Girt, DO  LANTUS SOLOSTAR 100 UNIT/ML SOPN Inject 50 Units into the skin at bedtime.  08/09/13   Historical Provider, MD  loperamide (IMODIUM) 2 MG capsule Take 2 mg by mouth as needed for diarrhea or loose stools.    Historical Provider, MD  midodrine (PROAMATINE) 10 MG tablet Take 20 mg by mouth 3 (three) times daily. Take two 10 mg tabs 3 times a day    Historical Provider, MD  Multiple Vitamin (RENAL MULTIVITAMIN/ZINC) TABS Take 1 tablet by mouth at bedtime.     Historical Provider, MD  omeprazole (PRILOSEC) 20 MG capsule Take 20 mg by mouth at bedtime.     Historical Provider, MD  Probiotic Product (ALIGN PO) Take 1 capsule by mouth at bedtime.      Historical Provider, MD  sevelamer carbonate (RENVELA) 800 MG tablet Take 4,000 mg by mouth 3 (three) times daily with meals.    Historical Provider, MD   BP 89/80 mmHg  Pulse 90  Temp(Src) 99.8 F (37.7 C)  Resp 18  SpO2 100%   Physical Exam  Constitutional: She appears well-developed and well-nourished.  HENT:  Head: Normocephalic and atraumatic.  Eyes: Conjunctivae are normal. Right eye exhibits no discharge. Left eye exhibits no discharge.  Neck:  Normal range of motion. Neck supple.  Cardiovascular: Normal rate, regular rhythm and normal heart sounds.   Pulmonary/Chest: Effort normal and breath sounds normal.  Abdominal: Soft. There is no tenderness.  Neurological: She is alert.  Skin: Skin is warm and dry. There is erythema.  Eschar and drainage from the R ring fingertip. There is another substantial area of eschar overlying the 5th MCP. R hand is diffusely swollen and warm. Warmth extends to forearm. Several other chronic wounds of the fingers and hand.   Several previous partial amputations of L hand however no infections at current time.   Psychiatric: She has a normal mood and affect.  Nursing note and vitals reviewed.   ED Course  Procedures (including critical care time) Labs Review Labs Reviewed  COMPREHENSIVE METABOLIC PANEL - Abnormal; Notable for the following:    Chloride 90 (*)    CO2 19 (*)    Glucose, Bld 382 (*)    BUN 68 (*)    Creatinine, Ser 6.92 (*)    Calcium 7.6 (*)    Albumin 2.7 (*)    Alkaline Phosphatase 280 (*)    GFR calc non Af Amer 6 (*)    GFR calc Af Amer 7 (*)    Anion gap 26 (*)    All other components within normal limits  CBC WITH DIFFERENTIAL/PLATELET - Abnormal; Notable for the following:    WBC 19.3 (*)    Hemoglobin 11.0 (*)    MCHC 29.3 (*)    RDW 18.6 (*)    Neutro Abs 17.0 (*)    Monocytes Absolute 1.2 (*)    All other components within normal limits  I-STAT CG4 LACTIC ACID, ED - Abnormal; Notable for the following:     Lactic Acid, Venous 5.74 (*)    All other components within normal limits  CULTURE, BLOOD (ROUTINE X 2)  CULTURE, BLOOD (ROUTINE X 2)    Imaging Review Dg Hand Complete Right  11/05/2015  CLINICAL DATA:  Pain and swelling for 6 days. EXAM: RIGHT HAND - COMPLETE 3+ VIEW COMPARISON:  04/25/2015 FINDINGS: Diffuse and significant soft tissue swelling/edema involving the entire hand. Suspect areas of gas in the soft tissues. Prior amputations are noted involving the distal phalanges. It appears that there may be none or very little skin coverage over the distal aspect of the middle phalanx of the fourth finger. The distal phalanx is dislocated. No obvious new/ acute destructive bony changes to suggest osteomyelitis. IMPRESSION: 1. Diffuse and marked soft tissue swelling/edema and possible gas in the soft tissues. 2. Palmar dislocation involving the distal phalanx of the fourth finger. It appears that the distal aspect of the middle phalanx is not covered by soft tissue/skin. Septic arthritis at the interphalangeal joint is possible cause. 3. No obvious destructive bony changes. Remote amputations are noted. Electronically Signed   By: Marijo Sanes M.D.   On: 11/21/2015 12:15   I have personally reviewed and evaluated these images and lab results as part of my medical decision-making.   EKG Interpretation None       10:58 AM Patient seen and examined. Work-up initiated. Medications ordered. Discussed with Dr. Lita Mains who will see. Patient appearance is non-toxic.   Vital signs reviewed and are as follows: BP 89/80 mmHg  Pulse 90  Temp(Src) 99.8 F (37.7 C)  Resp 18  SpO2 100%  11:36 AM Lactic acid noted. Fluid bolus ordered. As she is ESRD, will reassess after each bolus.  I spoke with Dr.  Florene Glen who will arrange for dialysis.   11:57 AM I spoke with Dr. Grandville Silos who is aware of patient's pending admission. States he will see patient sometime today.   12:43 PM Patient re-evaluated.  She c/o pressure in chest that she attributes to feeling fluid overloaded. No SOB or difficulty breathing. Patient requests her noon dose of midodrine, states her BP drops when she does not take this. I told her she can go ahead and take it. Will admit to hospital at this time, CMP has resulted.    12:56 PM Spoke with Dyanne Carrel NP who will see and admit.    CRITICAL CARE Performed by: Faustino Congress Total critical care time: 40 minutes Critical care time was exclusive of separately billable procedures and treating other patients. Critical care was necessary to treat or prevent imminent or life-threatening deterioration. Critical care was time spent personally by me on the following activities: development of treatment plan with patient and/or surrogate as well as nursing, discussions with consultants, evaluation of patient's response to treatment, examination of patient, obtaining history from patient or surrogate, ordering and performing treatments and interventions, ordering and review of laboratory studies, ordering and review of radiographic studies, pulse oximetry and re-evaluation of patient's condition.   MDM   Final diagnoses:  Sepsis, due to unspecified organism ([place])  Infected finger  Cellulitis of left hand  ESRD (end stage renal disease) ([place])  Hyperglycemia   Admit.     Carlisle Cater, PA-C 11/07/2015 1257  Julianne Rice, MD 11/25/2015 0000

## 2015-11-11 NOTE — Consult Note (Signed)
ORTHOPAEDIC CONSULTATION HISTORY & PHYSICAL REQUESTING PHYSICIAN: Waldemar Dickens, MD  Chief Complaint: Bilateral finger gangrene  HPI: [name] is a 50 y.o. female who Is previously known to me, and has had dry gangrene of the left thumb and index fingertips, as well as the right ring fingertip.  She reports that over the past 24 hours, she has begun to have more pain, swelling, and redness with increasing active drainage from the right ring finger.  She was previously scheduled to have these issues addressed operatively tomorrow.  Past Medical History  Diagnosis Date  . 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 Lung nodule     left lung  . Sinus problem   . Neuropathy ([place])   . Hyperkalemia   . Allergy     seasonal  . Cataract     surgery  . GERD (gastroesophageal reflux disease)   . Low blood pressure   . Neuromuscular disorder ([place])   . Gait difficulty     "uses motor or standard wheelchair" -unsteady on feet.  . Arteriovenous fistula ([place])     Rt. upper arm, 02-08-14 now has AV Goretex graft Lt. thigh  . ESRD (end stage renal disease) ([place])     end stage, on dialysis since 05/2002. dry wt. 113 kg.M-W-F, [place] [address].  Marland Kitchen Complication of anesthesia     BP runs usually low. Had an issue with "twilight sleep" in past.  . 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 Personal history of colonic polyps - adenomas 02/15/2014    02/15/2014 2 small polyps , max 7 mm transverse colon    . Diabetes mellitus     type 2  . Hypotension   . DVT (deep venous thrombosis) ([place]) 05/2009  . Unable to bear weight   . Third degree burn     Hand  March- follwed at [facility]  . Diabetic foot ulcer ([place]) 06/13/2015  . Fungal infection 06/13/2015  . 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 Bite wound of finger 06/13/2015  . Osteomyelitis of finger ([place]) 08/15/2015   Past Surgical History  Procedure Laterality Date  . Cataract extraction Bilateral     . Toe amputation Right     Great toe and second toe  . Fistula surgery      multiple graft  . Knee arthroscopy Left   . Arteriovenous graft placement Left     lt. thigh at present  . Av fistula placement Right     right upper arm  . Colonoscopy with propofol N/A 02/15/2014    Procedure: COLONOSCOPY WITH PROPOFOL;  Surgeon: Gatha Mayer, MD;  [facility];  Service: Endoscopy;  Laterality: N/A;  . Breast surgery Left     removal of  precancerous.  Lumpectomy  . Toe amputation Left     2nd toe and 1/2 3rd toe  . Flair stent graft in lt leg      MR conditional 3T & 1.5T  3000-Gauss/cm or less  . Incision and drainage of wound Left 06/27/2015    Procedure: Irrigation and Debridement Left Middle finger,  Partial Amputation of left middle finger;  Surgeon: Dayna Barker, MD;  Location: [place];  Service: Plastics;  Laterality: Left;   Social History   Social History  . Marital Status: Married    Spouse Name: N/A  . Number of Children: N/A  . Years of Education: N/A   Occupational History  . Disabled  Social History Main Topics  . Smoking status: Never Smoker   . Smokeless tobacco: Never Used  . Alcohol Use: Yes     Comment: occasionally  . Drug Use: No  . Sexual Activity: Not Asked   Other Topics Concern  . None   Social History Narrative   Lives with husband in [place].  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   Family History  Problem Relation Age of Onset  . Hypertension Mother   . Liver disease Mother   . Hypertension Father   . Colon polyps Father   . Heart disease Father   . Diabetes Maternal Grandmother   . Diabetes Maternal Grandfather   . Diabetes Paternal Grandmother   . Diabetes Paternal Grandfather   . Colon cancer Neg Hx    Allergies  Allergen Reactions  . Zosyn [Piperacillin Sod-Tazobactam So] Anaphylaxis and Shortness Of Breath    Passed out  . Benadryl [Diphenhydramine Hcl (Sleep)] Other (See  Comments)    HYPOTENSION  . Wellbutrin [Bupropion] Other (See Comments)    HALLUCINATIONS "Bites Nails"   . Morphine Other (See Comments)    UNSPECIFIED Rx During Surgery  . Tylenol [Acetaminophen] Other (See Comments)    HYPOTENSION  . Amitriptyline Other (See Comments)    Sleepy  . Percocet [Oxycodone-Acetaminophen] Nausea And Vomiting   Prior to Admission medications   Medication Sig Start Date End Date Taking? Authorizing Provider  aspirin EC 81 MG tablet Take 81 mg by mouth at bedtime.   Yes Historical Provider, MD  cinacalcet (SENSIPAR) 60 MG tablet Take 60 mg by mouth 2 (two) times daily.    Yes Historical Provider, MD  doxycycline (VIBRA-TABS) 100 MG tablet Take 100 mg by mouth at bedtime. 10/26/15  Yes Historical Provider, MD  gabapentin (NEURONTIN) 100 MG tablet Take 200 mg by mouth 2 (two) times daily.    Yes Historical Provider, MD  insulin aspart (NOVOLOG FLEXPEN) 100 UNIT/ML FlexPen Inject 20 Units into the skin 3 (three) times daily with meals. And pen needles 3/day 07/03/15  Yes Jessica U Vann, DO  LEVEMIR FLEXTOUCH 100 UNIT/ML Pen Inject 50 Units into the skin at bedtime. 10/18/15  Yes Historical Provider, MD  midodrine (PROAMATINE) 10 MG tablet Take 20 mg by mouth 3 (three) times daily. Take 2 tablets before dialysis, 2 tablets mid-way through dialysis, and 2 tablets in the evening   Yes Historical Provider, MD  Multiple Vitamin (RENAL MULTIVITAMIN/ZINC) TABS Take 1 tablet by mouth at bedtime.    Yes Historical Provider, MD  omeprazole (PRILOSEC) 20 MG capsule Take 20 mg by mouth at bedtime.    Yes Historical Provider, MD  Probiotic Product (ALIGN PO) Take 1 capsule by mouth at bedtime.    Yes Historical Provider, MD  sevelamer carbonate (RENVELA) 800 MG tablet Take 4,000 mg by mouth 3 (three) times daily with meals as needed (with snacks).    Yes Historical Provider, MD   Dg Chest Port 1 View  11/03/2015  CLINICAL DATA:  Scheduled for amputation of infected fingers  tomorrow, patient hypotensive and febrile to 101 degrees EXAM: PORTABLE CHEST 1 VIEW COMPARISON:  06/27/2015 FINDINGS: Moderate cardiac silhouette enlargement. Mild diffuse interstitial prominence with no definite pulmonary edema. No consolidation or effusion. IMPRESSION: No active disease. Electronically Signed   By: Skipper Cliche M.D.   On: 11/24/2015 14:38   Dg Hand Complete Right  10/30/2015  CLINICAL DATA:  Pain and swelling for 6 days. EXAM: RIGHT HAND -  COMPLETE 3+ VIEW COMPARISON:  04/25/2015 FINDINGS: Diffuse and significant soft tissue swelling/edema involving the entire hand. Suspect areas of gas in the soft tissues. Prior amputations are noted involving the distal phalanges. It appears that there may be none or very little skin coverage over the distal aspect of the middle phalanx of the fourth finger. The distal phalanx is dislocated. No obvious new/ acute destructive bony changes to suggest osteomyelitis. IMPRESSION: 1. Diffuse and marked soft tissue swelling/edema and possible gas in the soft tissues. 2. Palmar dislocation involving the distal phalanx of the fourth finger. It appears that the distal aspect of the middle phalanx is not covered by soft tissue/skin. Septic arthritis at the interphalangeal joint is possible cause. 3. No obvious destructive bony changes. Remote amputations are noted. Electronically Signed   By: Marijo Sanes M.D.   On: 11/05/2015 12:15    Positive ROS: All other systems have been reviewed and were otherwise negative with the exception of those mentioned in the HPI and as above.  Physical Exam: Vitals: Refer to EMR. Constitutional:  WD, WN, NAD HEENT:  NCAT, EOMI Neuro/Psych:  Alert & oriented to person, place, and time; appropriate mood & affect Lymphatic: No generalized extremity edema or lymphadenopathy Extremities / MSK:  The extremities are normal with respect to appearance, ranges of motion, joint stability, muscle strength/tone, sensation, & perfusion  except as otherwise noted:  On the left side, the digits are stable.   There are wounds at the tip of the thumb with exposed bone, the tip of the index finger, and the other digits have no active wounds present.  On the right side,  The tip of the ring finger is necrotic, it is more swollen and reddened, there are some petechial markings in the palm, and even an area in the palm that appears as if there may be  Intradermal abscessing.  There are wounds of the dermis overlying the  Small finger MCP region and a large circular area of eschar on the volar surface of the right distal forearm.  There is no redness about it.  Assessment: Active wounds of multiple digit tips,  The right ring finger appears now infectious,  Likely with infection extending into the palm.  Plan: I discussed with her a plan that included operating upon  Her hands later today, rather than tomorrow as previously planned.  She is being admitted with the diagnosis of sepsis,  On IV antibiotics, and needing her customary dialysis for a Monday.Questions were invited and answered.  Consent obtained.  We will proceed later this afternoon/early evening.  Rayvon Char Grandville Silos, [address], [place]  [address] Office: [phone] Mobile: [phone]  11/19/2015, 2:50 PM

## 2015-11-11 NOTE — ED Notes (Signed)
Pt CBG, 386. Nurse was notified.

## 2015-11-11 NOTE — Op Note (Signed)
11/23/2015  9:20 PM  PATIENT:  [name]  50 y.o. female  PRE-OPERATIVE DIAGNOSIS:  Necrosis of multiple fingertips with likely deep space infection of the right hand  POST-OPERATIVE DIAGNOSIS:  Same, with confirmed deep space infection of right hand   PROCEDURE:   1.  Left thumb amputation through proximal phalanx    2.  Left index finger amputation through PIP joint    3.  Right small finger amputation through middle phalanx    4.  Right ring finger amputation through PIP joint and irrigation of flexor tendon sheath    5.  Right hand incision and drainage of mid palmar space with radical flexor tenosynovectomy, flexor tendon excision, and open carpal tunnel release    6.  Excisional debridement of partial-thickness skin of right dorsal hand and volar wrist wounds  SURGEON: Rayvon Char. Grandville Silos, MD  PHYSICIAN ASSISTANT: None  ANESTHESIA:  general  SPECIMENS:  None  DRAINS:   None  EBL:  less than 50 mL  PREOPERATIVE INDICATIONS:  [name] is a  50 y.o. female with multiple gangrenous digits and suspected deep infection of right hand  The risks benefits and alternatives were discussed with the patient preoperatively including but not limited to the risks of infection, bleeding, nerve injury, cardiopulmonary complications, the need for revision surgery, among others, and the patient verbalized understanding and consented to proceed.  OPERATIVE IMPLANTS: None  OPERATIVE PROCEDURE:  After receiving prophylactic antibiotics, the patient was escorted to the operative theatre and placed in a supine position.  A surgical "time-out" was performed during which the planned procedure, proposed operative site, and the correct patient identity were compared to the operative consent and agreement confirmed by the circulating nurse according to current facility policy.  Both hands were prepped with Betadine and draped in usual fashion.  Attention was first directed to the left  side, where an Esmarch tourniquet was used around the wrist and the thumb and index fingers were amputated by first circumferentially incising through healthy tissue with a 1-2 mm buffer from the necrotic areas.  The bone was then shortened such that the soft tissues were closed.  The wounds were irrigated copiously and the tourniquet was removed.  This allowed for closure of both wounds with 4-0 Vicryl Rapide interrupted sutures.  Each digit was then dressed.  Attention was then shifted to the right hand where an Esmarch  tourniquet was placed around the wrist.  The ring and small fingers were both amputated similarly to the digits on the left side.  There was no significant purulence in the small finger but there was purulence in the flexor sheath of the ring finger.  A Brunner zigzag incision was made in the palm extending to the distal edge of the carpal tunnel where a linear longitudinal incision was made along the radial border of the ring ray back to the distal edge of the circular burn on the forearm.  Open carpal tunnel release was performed by incising the transverse carpal ligament under direct visualization with loupe assistance.  Necrotic tissue was found in the palm.  Cultures were obtained.  A radical flexor tenosynovectomy was performed in the carpal tunnel and along the tendons the distal to it, particularly for the long ring and small fingers.  A flexor tendon that I believe went to the long finger originally and had obviously been allowed to retract from previous surgery, was excised.  The wound is copiously irrigated and the flexor sheath irrigated from  proximal to distal with good egress distally and the irrigant eventually running clean.  After copiously irrigating, the tourniquet was released some additional hemostasis obtained and then the palm incision was closed loosely to allow for continued drainage.  The ring finger was reapproximated with one simple suture of 4-0 nylon in the middle  again so that it could drain and the small finger was closed with 3 sutures.  The burn on the volar aspect of the forearm was then excisionally debrided, and there appeared to be alive healthy dermis throughout much of it.  This was more of a tangential excision.  Same thing was done on the dorsal wound over the MCP joint of the small finger.  Dressing was applied to the right side and she was taken to recovery room.  DISPOSITION: She will continue inpatient care, and we'll likely need to return the operating room at least one additional time for second look irrigation and debridement and more definitive wound closure if the wound appears ready for that.

## 2015-11-11 NOTE — Transfer of Care (Signed)
Immediate Anesthesia Transfer of Care Note  Patient: [name]  Procedure(s) Performed: Procedure(s): PARTIAL AMPUTATION OF LEFT THUMB, LEFT INDEX, RIGHT RING FINGER, AND RIGHT SMALL FINGER DEBRIDEMENT,RIGHT FLEXORTENDONECTOMY, RIGHT WRIST DEBRIDEMENT (Bilateral)  Patient Location: PACU  Anesthesia Type:General  Level of Consciousness: oriented, sedated, patient cooperative and responds to stimulation  Airway & Oxygen Therapy: Patient Spontanous Breathing and Patient connected to nasal cannula oxygen  Post-op Assessment: Report given to RN, Post -op Vital signs reviewed and stable and Patient moving all extremities X 4  Post vital signs: Reviewed and stable  Last Vitals:  Filed Vitals:   11/22/2015 1500 11/09/2015 1545  BP: 99/49 100/47  Pulse: 88 90  Temp:    Resp: 20 23    Complications: No apparent anesthesia complications

## 2015-11-12 ENCOUNTER — Ambulatory Visit: Payer: Medicare Other | Admitting: Internal Medicine

## 2015-11-12 ENCOUNTER — Encounter (HOSPITAL_COMMUNITY): Payer: Self-pay | Admitting: Orthopedic Surgery

## 2015-11-12 DIAGNOSIS — Z992 Dependence on renal dialysis: Secondary | ICD-10-CM

## 2015-11-12 DIAGNOSIS — E0822 Diabetes mellitus due to underlying condition with diabetic chronic kidney disease: Secondary | ICD-10-CM

## 2015-11-12 DIAGNOSIS — I96 Gangrene, not elsewhere classified: Secondary | ICD-10-CM

## 2015-11-12 DIAGNOSIS — L089 Local infection of the skin and subcutaneous tissue, unspecified: Secondary | ICD-10-CM

## 2015-11-12 DIAGNOSIS — Z794 Long term (current) use of insulin: Secondary | ICD-10-CM

## 2015-11-12 LAB — GLUCOSE, CAPILLARY
GLUCOSE-CAPILLARY: 329 mg/dL — AB (ref 65–99)
Glucose-Capillary: 133 mg/dL — ABNORMAL HIGH (ref 65–99)
Glucose-Capillary: 192 mg/dL — ABNORMAL HIGH (ref 65–99)
Glucose-Capillary: 349 mg/dL — ABNORMAL HIGH (ref 65–99)
Glucose-Capillary: 287 mg/dL — ABNORMAL HIGH (ref 65–99)

## 2015-11-12 LAB — COMPREHENSIVE METABOLIC PANEL
ALK PHOS: 209 U/L — AB (ref 38–126)
ALT: 13 U/L — AB (ref 14–54)
AST: 20 U/L (ref 15–41)
Albumin: 2.4 g/dL — ABNORMAL LOW (ref 3.5–5.0)
Anion gap: 20 — ABNORMAL HIGH (ref 5–15)
BUN: 23 mg/dL — AB (ref 6–20)
CALCIUM: 7.9 mg/dL — AB (ref 8.9–10.3)
CHLORIDE: 94 mmol/L — AB (ref 101–111)
CO2: 24 mmol/L (ref 22–32)
CREATININE: 3.58 mg/dL — AB (ref 0.44–1.00)
GFR, EST AFRICAN AMERICAN: 16 mL/min — AB (ref >60)
GFR, EST NON AFRICAN AMERICAN: 14 mL/min — AB (ref >60)
Glucose, Bld: 190 mg/dL — ABNORMAL HIGH (ref 65–99)
Potassium: 3.9 mmol/L (ref 3.5–5.1)
Sodium: 138 mmol/L (ref 135–145)
Total Bilirubin: 1.4 mg/dL — ABNORMAL HIGH (ref 0.3–1.2)
Total Protein: 6.1 g/dL — ABNORMAL LOW (ref 6.5–8.1)

## 2015-11-12 LAB — CBC
HCT: 32.8 % — ABNORMAL LOW (ref 36.0–46.0)
Hemoglobin: 9.5 g/dL — ABNORMAL LOW (ref 12.0–15.0)
MCH: 26.1 pg (ref 26.0–34.0)
MCHC: 29 g/dL — ABNORMAL LOW (ref 30.0–36.0)
MCV: 90.1 fL (ref 78.0–100.0)
PLATELETS: 167 10*3/uL (ref 150–400)
RBC: 3.64 MIL/uL — ABNORMAL LOW (ref 3.87–5.11)
RDW: 18.3 % — AB (ref 11.5–15.5)
WBC: 17.5 10*3/uL — AB (ref 4.0–10.5)

## 2015-11-12 LAB — MRSA PCR SCREENING: MRSA BY PCR: NEGATIVE

## 2015-11-12 LAB — HEMOGLOBIN A1C
HEMOGLOBIN A1C: 10.1 % — AB (ref 4.8–5.6)
MEAN PLASMA GLUCOSE: 243 mg/dL

## 2015-11-12 LAB — LACTIC ACID, PLASMA
LACTIC ACID, VENOUS: 2.7 mmol/L — AB (ref 0.5–2.0)
Lactic Acid, Venous: 3 mmol/L (ref 0.5–2.0)

## 2015-11-12 MED ORDER — HYDROCODONE-ACETAMINOPHEN 7.5-325 MG PO TABS
1.0000 | ORAL_TABLET | Freq: Four times a day (QID) | ORAL | Status: DC | PRN
  Administered 2015-11-12: 1 via ORAL
  Filled 2015-11-12: qty 1

## 2015-11-12 MED ORDER — ACETAMINOPHEN 500 MG PO TABS
500.0000 mg | ORAL_TABLET | Freq: Once | ORAL | Status: DC
  Filled 2015-11-12: qty 1

## 2015-11-12 MED ORDER — INSULIN DETEMIR 100 UNIT/ML ~~LOC~~ SOLN
50.0000 [IU] | Freq: Every day | SUBCUTANEOUS | Status: DC
Start: 2015-11-12 — End: 2015-11-21
  Administered 2015-11-12 – 2015-11-20 (×9): 50 [IU] via SUBCUTANEOUS
  Filled 2015-11-12 (×11): qty 0.5

## 2015-11-12 MED ORDER — SENNA 8.6 MG PO TABS
1.0000 | ORAL_TABLET | Freq: Every day | ORAL | Status: DC | PRN
  Administered 2015-11-17: 8.6 mg via ORAL
  Filled 2015-11-12: qty 1

## 2015-11-12 MED ORDER — SODIUM CHLORIDE 0.9 % IV BOLUS (SEPSIS)
500.0000 mL | Freq: Once | INTRAVENOUS | Status: AC
  Administered 2015-11-12: 250 mL via INTRAVENOUS

## 2015-11-12 MED ORDER — SODIUM CHLORIDE 0.9 % IV BOLUS (SEPSIS)
500.0000 mL | Freq: Once | INTRAVENOUS | Status: AC
  Administered 2015-11-12: 500 mL via INTRAVENOUS

## 2015-11-12 MED ORDER — MIDODRINE HCL 5 MG PO TABS
20.0000 mg | ORAL_TABLET | Freq: Three times a day (TID) | ORAL | Status: DC
  Administered 2015-11-12 – 2015-11-25 (×32): 20 mg via ORAL
  Filled 2015-11-12 (×29): qty 4

## 2015-11-12 MED ORDER — VANCOMYCIN HCL IN DEXTROSE 1-5 GM/200ML-% IV SOLN
1000.0000 mg | INTRAVENOUS | Status: DC
  Administered 2015-11-13: 1000 mg via INTRAVENOUS
  Filled 2015-11-12 (×2): qty 200

## 2015-11-12 MED ORDER — ACETAMINOPHEN 325 MG PO TABS
650.0000 mg | ORAL_TABLET | Freq: Once | ORAL | Status: AC
  Administered 2015-11-12: 650 mg via ORAL
  Filled 2015-11-12: qty 2

## 2015-11-12 MED ORDER — MIDODRINE HCL 5 MG PO TABS
20.0000 mg | ORAL_TABLET | Freq: Every day | ORAL | Status: DC
  Filled 2015-11-12: qty 4

## 2015-11-12 MED ORDER — SODIUM CHLORIDE 0.9 % IV BOLUS (SEPSIS): 500.0000 mL | Freq: Once | INTRAVENOUS | Status: DC

## 2015-11-12 MED ORDER — LACTULOSE 10 GM/15ML PO SOLN: 20.0000 g | Freq: Every day | ORAL | Status: DC | PRN

## 2015-11-12 MED ORDER — LACTULOSE 10 GM/15ML PO SOLN
20.0000 g | Freq: Two times a day (BID) | ORAL | Status: AC
  Administered 2015-11-12: 20 g via ORAL
  Filled 2015-11-12: qty 30

## 2015-11-12 NOTE — Progress Notes (Signed)
Inpatient Diabetes Program Recommendations  AACE/ADA: New Consensus Statement on Inpatient Glycemic Control (2015)  Target Ranges:  Prepandial:   less than 140 mg/dL      Peak postprandial:   less than 180 mg/dL (1-2 hours)      Critically ill patients:  140 - 180 mg/dL   Review of Glycemic Control  Diabetes history: DM 2 Outpatient Diabetes medications: Levemir 50 units, Novolog 20 units TID with meals Current orders for Inpatient glycemic control: Lantus 40, Novolog Sensitive + HS + Novolog 4 units TID meal coverage  Inpatient Diabetes Program Recommendations: Insulin - Basal: Patient did not get basal insulin last night due to it being held while patient was in surgery. Please consider changing administration to daily so patient can receive dose today.  Thanks,  Tama Headings RN, MSN, [facility] Inpatient Diabetes Coordinator Team Pager [phone] (8a-5p)

## 2015-11-12 NOTE — Progress Notes (Signed)
CRITICAL VALUE ALERT  Critical value received:  Lactic Acid  Date of notification:  11/12/2015  Time of notification:  [id number]  Critical value read back:Yes.    Nurse who received alert:  Norva Karvonen, RN   MD notified (1st page):  Dr. Clementeen Graham  Time of first page:  [phone]  MD notified (2nd page):  Time of second page:  Responding MD:  Dr. Clementeen Graham   Time MD responded:  [phone]

## 2015-11-12 NOTE — Progress Notes (Signed)
Pharmacy Antibiotic Note  [name] is a 50 y.o. female admitted on 11/19/2015 with wound infection.  Pharmacy has been consulted for vancomycin dosing. Tmax is 102.5 and WBC is elevated at 17.5. Planning HD tomorrow on her regular outpatient schedule.   Plan: - Vancomycin 1gm post-HD MWF - F/u renal plans, C&S, clinical status and pre-HD level when appropriate  Height: 5\' 8"  (172.7 cm) Weight: 236 lb 5.3 oz (107.2 kg) IBW/kg (Calculated) : 63.9  Temp (24hrs), Avg:99.8 F (37.7 C), Min:97.9 F (36.6 C), Max:102.5 F (39.2 C)   Recent Labs Lab 11/14/2015 1122 11/16/2015 1145 11/22/2015 1314 11/07/2015 1324 11/12/15 0448  WBC  --  19.3*  --   --  17.5*  CREATININE  --  6.92* 6.90*  --  3.58*  LATICACIDVEN 5.74*  --   --  3.33*  --     Estimated Creatinine Clearance: 24.6 mL/min (by C-G formula based on Cr of 3.58).    Allergies  Allergen Reactions  . Zosyn [Piperacillin Sod-Tazobactam So] Anaphylaxis and Shortness Of Breath    Passed out  . Benadryl [Diphenhydramine Hcl (Sleep)] Other (See Comments)    HYPOTENSION  . Wellbutrin [Bupropion] Other (See Comments)    HALLUCINATIONS "Bites Nails"   . Morphine Other (See Comments)    UNSPECIFIED Rx During Surgery  . Tylenol [Acetaminophen] Other (See Comments)    HYPOTENSION  . Amitriptyline Other (See Comments)    Sleepy  . Percocet [Oxycodone-Acetaminophen] Nausea And Vomiting    Antimicrobials this admission: 3/13 vanc >>   Dose adjustments this admission: n/a  Microbiology results: Pending  Salome Arnt, PharmD, BCPS Pager # [phone] 11/12/2015 10:07 AM

## 2015-11-12 NOTE — Progress Notes (Signed)
Pt has fever and low BP. Asked RN to give the bolus that was ordered prn by nephrology. Tylenol x 1. Cultures have been done.  KJKG, NP [facility]

## 2015-11-12 NOTE — Progress Notes (Signed)
Assessment/Plan: Sepsis secondary to wounds R. Hand, s/p partial digital amputations 1. ESRD - MWF at [facility]. Next HD in AM 2. Hypotension/volume - On Midodrine 3. Anemia - No esa. Follow HGB  4. Nutrition - Albumin 2.7.  5. DM: per primary. 6. Constipation Rx Lactulos  Dialysis Orders: Center: [facility] MonWedFri, 5 hrs 0 min, 160NRe Optiflux, BFR 400, DFR Manual 800 mL/min, EDW 105 (kg), Dialysate 2.0 K, 2.5 Ca, UFR Profile: None, Sodium Model: None, Access: L thigh AV Graft Heparin: 74259 units per treatment Mircera: None Calcitriol: .75 mcg PO q MWF  Subjective: Interval History: c/o constipation  Objective: Vital signs in last 24 hours: Temp:  [97.9 F (36.6 C)-102.5 F (39.2 C)] 98.4 F (36.9 C) (03/14 0800) Pulse Rate:  [73-96] 73 (03/14 0827) Resp:  [14-27] 18 (03/14 0827) BP: (65-111)/(30-83) 78/42 mmHg (03/14 0827) SpO2:  [91 %-100 %] 100 % (03/14 0827) Weight:  [106.9 kg (235 lb 10.8 oz)-107.2 kg (236 lb 5.3 oz)] 107.2 kg (236 lb 5.3 oz) (03/14 0453) Weight change:   Intake/Output from previous day: 03/13 0701 - 03/14 0700 In: 1100 [I.V.:600; IV Piggyback:500] Out: 5310 [Blood:10] Intake/Output this shift:    General appearance: alert and cooperative Resp: clear to auscultation bilaterally Cardio: regular rate and rhythm, S1, S2 normal, no murmur, click, rub or gallop GI: mild tenderness Extremities: extremities normal, atraumatic, no cyanosis or edema ; left thigh AVGG Lab Results:  Recent Labs  11/20/2015 1145 11/12/15 0448  WBC 19.3* 17.5*  HGB 11.0* 9.5*  HCT 37.6 32.8*  PLT 213 167   BMET:  Recent Labs  11/27/2015 1145 11/03/2015 1314 11/12/15 0448  NA 135  --  138  K 4.7  --  3.9  CL 90*  --  94*  CO2 19*  --  24  GLUCOSE 382*  --  190*  BUN 68*  --  23*  CREATININE 6.92* 6.90* 3.58*  CALCIUM 7.6*  --  7.9*   No results for input(s): PTH in the last 72 hours. Iron Studies: No results for input(s): IRON, TIBC,  TRANSFERRIN, FERRITIN in the last 72 hours. Studies/Results: Dg Chest Port 1 View  11/09/2015  CLINICAL DATA:  Scheduled for amputation of infected fingers tomorrow, patient hypotensive and febrile to 101 degrees EXAM: PORTABLE CHEST 1 VIEW COMPARISON:  06/27/2015 FINDINGS: Moderate cardiac silhouette enlargement. Mild diffuse interstitial prominence with no definite pulmonary edema. No consolidation or effusion. IMPRESSION: No active disease. Electronically Signed   By: Esperanza Heir M.D.   On: 11/02/2015 14:38   Dg Hand Complete Right  11/24/2015  CLINICAL DATA:  Pain and swelling for 6 days. EXAM: RIGHT HAND - COMPLETE 3+ VIEW COMPARISON:  04/25/2015 FINDINGS: Diffuse and significant soft tissue swelling/edema involving the entire hand. Suspect areas of gas in the soft tissues. Prior amputations are noted involving the distal phalanges. It appears that there may be none or very little skin coverage over the distal aspect of the middle phalanx of the fourth finger. The distal phalanx is dislocated. No obvious new/ acute destructive bony changes to suggest osteomyelitis. IMPRESSION: 1. Diffuse and marked soft tissue swelling/edema and possible gas in the soft tissues. 2. Palmar dislocation involving the distal phalanx of the fourth finger. It appears that the distal aspect of the middle phalanx is not covered by soft tissue/skin. Septic arthritis at the interphalangeal joint is possible cause. 3. No obvious destructive bony changes. Remote amputations are noted. Electronically Signed   By: Orlene Plum.D.  On: 11/18/2015 12:15    Scheduled: . acidophilus  1 capsule Oral Daily  . aspirin EC  81 mg Oral QHS  . cinacalcet  60 mg Oral Q breakfast  . doxercalciferol  0.5 mcg Oral Q M,W,F-HD  . gabapentin  200 mg Oral BID  . insulin aspart  0-5 Units Subcutaneous QHS  . insulin aspart  0-9 Units Subcutaneous TID WC  . insulin aspart  4 Units Subcutaneous TID WC  . insulin glargine  40 Units  Subcutaneous QHS  . lactulose  20 g Oral BID  . midodrine  20 mg Oral TID WC  . multivitamin  1 tablet Oral QHS  . pantoprazole  40 mg Oral Daily  . sevelamer carbonate  4,000 mg Oral TID WC    LOS: 1 day   Anastyn Ayars C 11/12/2015,9:52 AM

## 2015-11-12 NOTE — Progress Notes (Addendum)
Resting comfortably.   Had temp spike overnight, better today. Pain less than yesterday.  Dressings in place Pt to get dialysis tomorrow and plan return to OR on Thursday for 2nd look I&D of right side. Will change dressings on left side in the OR as well.  Consider broadening antibiotic coverage as this process is likely polymicrobial.  Micheline Rough, MD Hand Surgery Mobile [phone]

## 2015-11-12 NOTE — Progress Notes (Signed)
Patient's temp 102.3. Pt refuses to take Tylenol stating that it lowers her blood pressure too much. NP notified. Blankets removed from patient, room temperature lowered to lowest setting, and ice packs applied to patient's side. Will continue to monitor and reassess.

## 2015-11-12 NOTE — Care Management Note (Signed)
Case Management Note  Patient Details  Name: [name] MRN: [id number] Date of Birth: May 25, 1966  Subjective/Objective:    Patient lives with spouse, she is wheelchair bound, has sepsis, hypotensive, HD patient M-W- F,  NCM will continue to follow for dc needs.                Action/Plan:   Expected Discharge Date:                  Expected Discharge Plan:  [place]  In-House Referral:     Discharge planning Services  CM Consult  Post Acute Care Choice:    Choice offered to:     DME Arranged:    DME Agency:     HH Arranged:    Vaida Kerchner Agency:     Status of Service:  In process, will continue to follow  Medicare Important Message Given:    Date Medicare IM Given:    Medicare IM give by:    Date Additional Medicare IM Given:    Additional Medicare Important Message give by:     If discussed at [place] of Stay Meetings, dates discussed:    Additional Comments:  Zenon Mayo, RN 11/12/2015, 6:22 PM

## 2015-11-12 NOTE — Progress Notes (Signed)
Pt arrived to 3S14.  [place] and [place] notified.  Pt oriented to room and call bell placed within reach.  BP low at 74/44 on admission, MD notified.  No family present at this time. Pt's cell phone placed on bedside table.  Will continue to monitor.

## 2015-11-12 NOTE — Progress Notes (Signed)
BP 74/44 - 553mL bolus given - BP now 88/42 this appears to be her usual range.  Provider notified.

## 2015-11-12 NOTE — Progress Notes (Signed)
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 She reported having of fever of 101F at home. In the ED she had a temperature of 99.8 Fahrenheit, hypotensive with blood pressure 73/40 mmHg, normal heart rate and normal O2 sat. She was found to have necrosis of multiple fingertips of both her hands. Lactic acid of 5.74. She received 1 L IV normal saline bolus and IV vancomycin. Patient admitted to hospitalist service on stepdown. Seen by hand surgeon and underwent amputation of left thumb, left index finger, right small finger, right ring finger and right hand incision and drainage of mid palmar space with radical flexor tenosynovectomy, flexor tendon excision and open carpal tunnel release. Also had excisional debridement of partial-thickness skin of right dorsal hand and volar wrist wounds.    Assessment/Plan: Sepsis secondary to multiple infected fingers Taken to OR with findings as above. Continue empiric vancomycin. Follow cultures. Follow with a lactate. -Further antibiotic course based on culture sensitivity results. -Patient was febrile overnight to 102.68F but refused Tylenol stating it makes her hypotensive. Will monitor. -Follow WBC.  Hypotension Chronic and is on midodrine during dialysis (patient reports taking it every day). Systolic blood pressure of 65 this morning. Given 250 mL normal saline bolus. Now improved. Switched midodrine to daily.  End-stage renal disease on dialysis Renal following.  Continue Sensipar, hectoral  Uncontrolled mellitus Continue Lantus with premeal aspart coverage. A1c of 10.1.  Peripheral vascular disease Continue aspirin  DVT prophylaxis: subcutaneous heparin  Diet: Renal/diabetic  Code Status: Full code Family Communication: None at bedside Disposition Plan: Continue step down monitoring. PT eval    Consultants:  Nephrology  Hand surgery  Procedures:  Patient of multiple fingers, right hand I&D on 3/13  Antibiotics:   IV vancomycin since 3/13  HPI/Subjective: Seen and examined. Was hypotensive overnight and 500 mL overnight  Objective: Filed Vitals:   11/12/15 1100 11/12/15 1200  BP: 80/47 87/47  Pulse: 73 76  Temp:    Resp: 16 15    Intake/Output Summary (Last 24 hours) at 11/12/15 1300 Last data filed at 11/12/15 0600  Gross per 24 hour  Intake   1100 ml  Output   5310 ml  Net  -4210 ml   Filed Weights   11/12/15 0210 11/12/15 0453  Weight: 106.9 kg (235 lb 10.8 oz) 107.2 kg (236 lb 5.3 oz)    Exam:   General:  Middle aged female appears fatigued, not in distress  HEENT: Moist mucosa, supple neck  Chest: Clear bilaterally  CVS: Normal S1-S2, no murmurs or gallop  GI: Soft, nondistended, nontender, bowel sounds present  Musculoskeletal: Dressing over right hand with a snap, dressing over left thumb and index finger, Warm, no edema, forefoot amputation, distal pulses sluggish left femoral graft  CNS: Alert and oriented    Data Reviewed: Basic Metabolic Panel:  Recent Labs Lab 16-Nov-2015 1145 10/30/2015 1314 11/12/15 0448  NA 135  --  138  K 4.7  --  3.9  CL 90*  --  94*  CO2 19*  --  24  GLUCOSE 382*  --  190*  BUN 68*  --  23*  CREATININE 6.92* 6.90* 3.58*  CALCIUM 7.6*  --  7.9*   Liver Function Tests:  Recent Labs Lab 11/24/2015 1145 11/12/15 0448  AST 28 20  ALT 15 13*  ALKPHOS 280* 209*  BILITOT 1.1 1.4*  PROT 6.9 6.1*  ALBUMIN 2.7* 2.4*   No results for input(s): LIPASE,  AMYLASE in the last 168 hours. No results for input(s): AMMONIA in the last 168 hours. CBC:  Recent Labs Lab 11/09/2015 1145 11/12/15 0448  WBC 19.3* 17.5*  NEUTROABS 17.0*  --   HGB 11.0* 9.5*  HCT 37.6 32.8*  MCV 89.7 90.1  PLT 213 167   Cardiac Enzymes: No results for input(s): CKTOTAL, CKMB, CKMBINDEX, TROPONINI in the last 168 hours. BNP (last 3 results) No results for input(s): BNP in the last 8760 hours.  ProBNP (last 3 results) No results for input(s): PROBNP in the last 8760 hours.  CBG:  Recent Labs Lab 11/17/2015 1701 11/18/2015 1950 11/12/15 0303 11/12/15 0804 11/12/15 1155  GLUCAP 310* 262* 133* 192* 329*    Recent Results (from the past 240 hour(s))  Culture, blood (routine x 2)     Status: None (Preliminary result)   Collection Time: 11/28/2015 11:02 AM  Result Value Ref Range Status   Specimen Description BLOOD RIGHT ANTECUBITAL  Final   Special Requests BOTTLES DRAWN AEROBIC AND ANAEROBIC 5CC  Final   Culture NO GROWTH < 24 HOURS  Final   Report Status PENDING  Incomplete  Culture, blood (routine x 2)     Status: None (Preliminary result)   Collection Time: 11/18/2015 11:11 AM  Result Value Ref Range Status   Specimen Description BLOOD BLOOD RIGHT FOREARM  Final   Special Requests BOTTLES DRAWN AEROBIC AND ANAEROBIC 5CC  Final   Culture NO GROWTH < 24 HOURS  Final   Report Status PENDING  Incomplete  Culture, routine-abscess     Status: None (Preliminary result)   Collection Time: 11/28/2015  9:04 PM  Result Value Ref Range Status   Specimen Description ABSCESS  Final   Special Requests   Final    LEFT HAND PALMAR ABSCESS OVER MEDIAL METACARPALS PATIENT ON FOLLOWING VANCOMYCIN   Gram Stain   Final    FEW WBC PRESENT,BOTH PMN AND MONONUCLEAR NO SQUAMOUS EPITHELIAL CELLS SEEN FEW GRAM POSITIVE COCCI IN PAIRS FEW GRAM POSITIVE RODS RARE GRAM NEGATIVE RODS Performed at [facility]    Culture PENDING  Incomplete   Report Status PENDING   Incomplete  Anaerobic culture     Status: None (Preliminary result)   Collection Time: 10/30/2015  9:05 PM  Result Value Ref Range Status   Specimen Description ABSCESS  Final   Special Requests   Final    LEFT HAND PALMAR ABSCESS OVER MEDIAL METACARPALS PATIENT ON FOLLOWING VANCOMYCIN   Gram Stain   Final    FEW WBC PRESENT,BOTH PMN AND MONONUCLEAR NO SQUAMOUS EPITHELIAL CELLS SEEN MODERATE GRAM POSITIVE COCCI IN PAIRS FEW GRAM POSITIVE RODS RARE GRAM NEGATIVE RODS Performed at [facility]    Culture PENDING  Incomplete   Report Status PENDING  Incomplete  MRSA PCR Screening     Status: None   Collection Time: 11/12/15  2:15 AM  Result Value Ref Range Status   MRSA by PCR NEGATIVE NEGATIVE Final    Comment:        The GeneXpert MRSA Assay (FDA approved for NASAL specimens only), is one component of a comprehensive  MRSA colonization surveillance program. It is not intended to diagnose MRSA infection nor to guide or monitor treatment for MRSA infections.      Studies: Dg Chest Port 1 View  11/27/2015  CLINICAL DATA:  Scheduled for amputation of infected fingers tomorrow, patient hypotensive and febrile to 101 degrees EXAM: PORTABLE CHEST 1 VIEW COMPARISON:  06/27/2015 FINDINGS: Moderate cardiac silhouette enlargement. Mild diffuse interstitial prominence with no definite pulmonary edema. No consolidation or effusion. IMPRESSION: No active disease. Electronically Signed   By: Esperanza Heir M.D.   On: 11/22/2015 14:38   Dg Hand Complete Right  11/28/2015  CLINICAL DATA:  Pain and swelling for 6 days. EXAM: RIGHT HAND - COMPLETE 3+ VIEW COMPARISON:  04/25/2015 FINDINGS: Diffuse and significant soft tissue swelling/edema involving the entire hand. Suspect areas of gas in the soft tissues. Prior amputations are noted involving the distal phalanges. It appears that there may be none or very little skin coverage over the distal aspect of the middle phalanx of the fourth  finger. The distal phalanx is dislocated. No obvious new/ acute destructive bony changes to suggest osteomyelitis. IMPRESSION: 1. Diffuse and marked soft tissue swelling/edema and possible gas in the soft tissues. 2. Palmar dislocation involving the distal phalanx of the fourth finger. It appears that the distal aspect of the middle phalanx is not covered by soft tissue/skin. Septic arthritis at the interphalangeal joint is possible cause. 3. No obvious destructive bony changes. Remote amputations are noted. Electronically Signed   By: Rudie Meyer M.D.   On: 11/28/2015 12:15    Scheduled Meds: . acidophilus  1 capsule Oral Daily  . aspirin EC  81 mg Oral QHS  . cinacalcet  60 mg Oral Q breakfast  . doxercalciferol  0.5 mcg Oral Q M,W,F-HD  . gabapentin  200 mg Oral BID  . insulin aspart  0-5 Units Subcutaneous QHS  . insulin aspart  0-9 Units Subcutaneous TID WC  . insulin aspart  4 Units Subcutaneous TID WC  . insulin glargine  40 Units Subcutaneous QHS  . lactulose  20 g Oral BID  . midodrine  20 mg Oral TID WC  . multivitamin  1 tablet Oral QHS  . pantoprazole  40 mg Oral Daily  . sevelamer carbonate  4,000 mg Oral TID WC  . [START ON 11/13/2015] vancomycin  1,000 mg Intravenous Q M,W,F-HD   Continuous Infusions: . sodium chloride 10 mL/hr at 10/30/2015 1704      Time spent:35 minutes    Hobert Poplaski  [facility] Pager [phone] If 7PM-7AM, please contact night-coverage at [email or web address], password [facility] [place] 11/12/2015, 1:00 PM  LOS: 1 day

## 2015-11-12 NOTE — Progress Notes (Signed)
CRITICAL VALUE ALERT  Critical value received:  Lactic Acid 2.7  Date of notification:  11/12/2015  Time of notification:  [id number]  Critical value read back:Yes.    Nurse who received alert:  Norva Karvonen, RN  MD notified (1st page):  Dr. Clementeen Graham  Time of first page:  [phone]    Responding MD: Dr. Clementeen Graham  Time MD responded:  [phone]

## 2015-11-13 DIAGNOSIS — B373 Candidiasis of vulva and vagina: Secondary | ICD-10-CM

## 2015-11-13 DIAGNOSIS — A4102 Sepsis due to Methicillin resistant Staphylococcus aureus: Secondary | ICD-10-CM

## 2015-11-13 DIAGNOSIS — K649 Unspecified hemorrhoids: Secondary | ICD-10-CM

## 2015-11-13 DIAGNOSIS — B3731 Acute candidiasis of vulva and vagina: Secondary | ICD-10-CM

## 2015-11-13 LAB — RENAL FUNCTION PANEL
Albumin: 2.1 g/dL — ABNORMAL LOW (ref 3.5–5.0)
Anion gap: 15 (ref 5–15)
BUN: 41 mg/dL — ABNORMAL HIGH (ref 6–20)
CO2: 24 mmol/L (ref 22–32)
Calcium: 7.4 mg/dL — ABNORMAL LOW (ref 8.9–10.3)
Chloride: 92 mmol/L — ABNORMAL LOW (ref 101–111)
Creatinine, Ser: 5.19 mg/dL — ABNORMAL HIGH (ref 0.44–1.00)
GFR calc Af Amer: 10 mL/min — ABNORMAL LOW (ref >60)
GFR calc non Af Amer: 9 mL/min — ABNORMAL LOW (ref >60)
Glucose, Bld: 270 mg/dL — ABNORMAL HIGH (ref 65–99)
Phosphorus: 5.3 mg/dL — ABNORMAL HIGH (ref 2.5–4.6)
Potassium: 4.1 mmol/L (ref 3.5–5.1)
Sodium: 131 mmol/L — ABNORMAL LOW (ref 135–145)

## 2015-11-13 LAB — CBC
HCT: 30 % — ABNORMAL LOW (ref 36.0–46.0)
HEMATOCRIT: 31.9 % — AB (ref 36.0–46.0)
Hemoglobin: 9 g/dL — ABNORMAL LOW (ref 12.0–15.0)
Hemoglobin: 8.8 g/dL — ABNORMAL LOW (ref 12.0–15.0)
MCH: 25.6 pg — ABNORMAL LOW (ref 26.0–34.0)
MCH: 26.3 pg (ref 26.0–34.0)
MCHC: 28.2 g/dL — ABNORMAL LOW (ref 30.0–36.0)
MCHC: 29.3 g/dL — ABNORMAL LOW (ref 30.0–36.0)
MCV: 90.9 fL (ref 78.0–100.0)
MCV: 89.6 fL (ref 78.0–100.0)
Platelets: 207 10*3/uL (ref 150–400)
Platelets: 184 10*3/uL (ref 150–400)
RBC: 3.51 MIL/uL — ABNORMAL LOW (ref 3.87–5.11)
RBC: 3.35 MIL/uL — ABNORMAL LOW (ref 3.87–5.11)
RDW: 18.4 % — AB (ref 11.5–15.5)
RDW: 18.3 % — ABNORMAL HIGH (ref 11.5–15.5)
WBC: 16.4 10*3/uL — AB (ref 4.0–10.5)
WBC: 15.3 K/uL — ABNORMAL HIGH (ref 4.0–10.5)

## 2015-11-13 LAB — BASIC METABOLIC PANEL
ANION GAP: 16 — AB (ref 5–15)
BUN: 39 mg/dL — ABNORMAL HIGH (ref 6–20)
CALCIUM: 7.5 mg/dL — AB (ref 8.9–10.3)
CHLORIDE: 91 mmol/L — AB (ref 101–111)
CO2: 24 mmol/L (ref 22–32)
Creatinine, Ser: 5.31 mg/dL — ABNORMAL HIGH (ref 0.44–1.00)
GFR calc non Af Amer: 9 mL/min — ABNORMAL LOW (ref >60)
GFR, EST AFRICAN AMERICAN: 10 mL/min — AB (ref >60)
GLUCOSE: 282 mg/dL — AB (ref 65–99)
Potassium: 4.1 mmol/L (ref 3.5–5.1)
Sodium: 131 mmol/L — ABNORMAL LOW (ref 135–145)

## 2015-11-13 LAB — GLUCOSE, CAPILLARY
GLUCOSE-CAPILLARY: 372 mg/dL — AB (ref 65–99)
GLUCOSE-CAPILLARY: 242 mg/dL — AB (ref 65–99)
Glucose-Capillary: 152 mg/dL — ABNORMAL HIGH (ref 65–99)

## 2015-11-13 MED ORDER — HYDROCORTISONE 2.5 % RE CREA
TOPICAL_CREAM | Freq: Two times a day (BID) | RECTAL | Status: DC
  Administered 2015-11-13 – 2015-11-17 (×8): via RECTAL
  Administered 2015-11-18: 1 via RECTAL
  Administered 2015-11-18 – 2015-11-20 (×5): via RECTAL
  Administered 2015-11-21: 1 via RECTAL
  Filled 2015-11-13: qty 28.35

## 2015-11-13 MED ORDER — ALTEPLASE 2 MG IJ SOLR: 2.0000 mg | Freq: Once | INTRAMUSCULAR | Status: DC | PRN

## 2015-11-13 MED ORDER — PENTAFLUOROPROP-TETRAFLUOROETH EX AERO: 1.0000 "application " | INHALATION_SPRAY | CUTANEOUS | Status: DC | PRN

## 2015-11-13 MED ORDER — LIDOCAINE-PRILOCAINE 2.5-2.5 % EX CREA: 1.0000 "application " | TOPICAL_CREAM | CUTANEOUS | Status: DC | PRN

## 2015-11-13 MED ORDER — SODIUM CHLORIDE 0.9 % IV SOLN: 100.0000 mL | INTRAVENOUS | Status: DC | PRN

## 2015-11-13 MED ORDER — HEPARIN SODIUM (PORCINE) 1000 UNIT/ML DIALYSIS: 20.0000 [IU]/kg | INTRAMUSCULAR | Status: DC | PRN

## 2015-11-13 MED ORDER — HEPARIN SODIUM (PORCINE) 1000 UNIT/ML DIALYSIS: 1000.0000 [IU] | INTRAMUSCULAR | Status: DC | PRN

## 2015-11-13 MED ORDER — MIDODRINE HCL 5 MG PO TABS
ORAL_TABLET | ORAL | Status: AC
  Filled 2015-11-13: qty 4

## 2015-11-13 MED ORDER — VANCOMYCIN HCL IN DEXTROSE 1-5 GM/200ML-% IV SOLN
INTRAVENOUS | Status: AC
  Filled 2015-11-13: qty 200

## 2015-11-13 MED ORDER — IBUPROFEN 200 MG PO TABS
400.0000 mg | ORAL_TABLET | Freq: Once | ORAL | Status: AC
  Administered 2015-11-13: 400 mg via ORAL
  Filled 2015-11-13: qty 2

## 2015-11-13 MED ORDER — LIDOCAINE HCL (PF) 1 % IJ SOLN: 5.0000 mL | INTRAMUSCULAR | Status: DC | PRN

## 2015-11-13 MED ORDER — FLUCONAZOLE 150 MG PO TABS
150.0000 mg | ORAL_TABLET | Freq: Once | ORAL | Status: AC
  Administered 2015-11-13: 150 mg via ORAL
  Filled 2015-11-13: qty 1

## 2015-11-13 NOTE — Progress Notes (Signed)
Patient asked for medication for hemorrhoids and yeast along with wanting to see MD today. Paged Dr. Wendee Beavers to notify of requests.

## 2015-11-13 NOTE — Procedures (Signed)
Low BP supported with midodrine.  Trying to remove volume. Hgb 8.8gm, k 4.1. [name] C

## 2015-11-13 NOTE — Progress Notes (Signed)
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 She reported having of fever of 101F at home. In the ED she had a temperature of 99.8 Fahrenheit, hypotensive with blood pressure 73/40 mmHg, normal heart rate and normal O2 sat. She was found to have necrosis of multiple fingertips of both her hands. Lactic acid of 5.74. She received 1 L IV normal saline bolus and IV vancomycin. Patient admitted to hospitalist service on stepdown. Seen by hand surgeon and underwent amputation of left thumb, left index finger, right small finger, right ring finger and right hand incision and drainage of mid palmar space with radical flexor tenosynovectomy, flexor tendon excision and open carpal tunnel release. Also had excisional debridement of partial-thickness skin of right dorsal hand and volar wrist wounds.    Assessment/Plan: Sepsis secondary to multiple infected fingers Taken to OR with findings as above. Continue empiric vancomycin. Follow cultures.  -Patient was febrile to 102.49F has refused Tylenol stating it makes her hypotensive. Will monitor. -Follow WBC.  Hypotension Chronic and is on midodrine during dialysis (patient reports taking it every day). Systolic blood pressure of 65 this morning. Given 250 mL normal saline bolus. Now improved. Switched midodrine to daily.  Vulvovaginal candidiasis - diflucan per pharmacy consult  Hemorrhoids: new problem - Anusol  End-stage renal disease on dialysis Renal following  and managing. Continue Sensipar, hectoral  Uncontrolled mellitus Continue Lantus with premeal aspart coverage. A1c of 10.1.  Peripheral vascular disease Continue aspirin  DVT prophylaxis: subcutaneous heparin  Diet: Renal/diabetic  Code Status: Full code Family Communication: None at bedside Disposition Plan: Continue step down monitoring. PT eval    Consultants:  Nephrology  Hand surgery  Procedures:  Patient of multiple fingers, right hand I&D on 3/13  Antibiotics:   IV vancomycin since 3/13  HPI/Subjective: Patient today is complaining of hemorrhoids and vaginal itching with white curdy discharge  Objective: Filed Vitals:   11/13/15 1332 11/13/15 1435  BP: 100/42   Pulse: 79   Temp:  98.8 F (37.1 C)  Resp: 20     Intake/Output Summary (Last 24 hours) at 11/13/15 1854 Last data filed at 11/13/15 1150  Gross per 24 hour  Intake    600 ml  Output   4111 ml  Net  -3511 ml   Filed Weights   11/13/15 0500 11/13/15 0640 11/13/15 1150  Weight: 110.7 kg (244 lb 0.8 oz) 109.7 kg (241 lb 13.5 oz) 104.9 kg (231 lb 4.2 oz)    Exam:   General:  Middle aged female, in nad  HEENT: Moist mucosa, supple neck  Chest: Clear bilaterally, no wheezes  CVS: Normal S1-S2, no murmurs or gallop  GI: Soft, nondistended, nontender, bowel sounds present  Musculoskeletal: Dressing over right hand with a snap, dressing over left thumb and index finger  CNS: Alert and oriented    Data Reviewed: Basic Metabolic Panel:  Recent Labs Lab 11/24/2015 1145 11/03/2015 1314 11/12/15 0448 11/13/15 0540 11/13/15 0657  NA 135  --  138 131* 131*  K 4.7  --  3.9 4.1 4.1  CL 90*  --  94* 91* 92*  CO2 19*  --  24 24 24   GLUCOSE 382*  --  190* 282* 270*  BUN 68*  --  23* 39* 41*  CREATININE 6.92* 6.90* 3.58* 5.31* 5.19*  CALCIUM 7.6*  --  7.9* 7.5* 7.4*  PHOS  --   --   --   --  5.3*   Liver Function Tests:  Recent Labs Lab 11/21/2015 1145 11/12/15 0448 11/13/15 0657   AST 28 20  --   ALT 15 13*  --   ALKPHOS 280* 209*  --   BILITOT 1.1 1.4*  --   PROT 6.9 6.1*  --   ALBUMIN 2.7* 2.4* 2.1*   No results for input(s): LIPASE, AMYLASE in the last 168 hours. No results for input(s): AMMONIA in the last 168 hours. CBC:  Recent Labs Lab 11/28/2015 1145 11/12/15 0448 11/13/15 0540 11/13/15 0657  WBC 19.3* 17.5* 16.4* 15.3*  NEUTROABS 17.0*  --   --   --   HGB 11.0* 9.5* 9.0* 8.8*  HCT 37.6 32.8* 31.9* 30.0*  MCV 89.7 90.1 90.9 89.6  PLT 213 167 207 184   Cardiac Enzymes: No results for input(s): CKTOTAL, CKMB, CKMBINDEX, TROPONINI in the last 168 hours. BNP (last 3 results) No results for input(s): BNP in the last 8760 hours.  ProBNP (last 3 results) No results for input(s): PROBNP in the last 8760 hours.  CBG:  Recent Labs Lab 11/12/15 1155 11/12/15 1616 11/12/15 2102 11/13/15 1238 11/13/15 1639  GLUCAP 329* 349* 287* 152* 372*    Recent Results (from the past 240 hour(s))  Culture, blood (routine x 2)     Status: None (Preliminary result)   Collection Time: 11/06/2015 11:02 AM  Result Value Ref Range Status   Specimen Description BLOOD RIGHT ANTECUBITAL  Final   Special Requests BOTTLES DRAWN AEROBIC AND ANAEROBIC 5CC  Final   Culture NO GROWTH 2 DAYS  Final   Report Status PENDING  Incomplete  Culture, blood (routine x 2)     Status: None (Preliminary result)   Collection Time: 11/29/2015 11:11 AM  Result Value Ref Range Status   Specimen Description BLOOD BLOOD RIGHT FOREARM  Final   Special Requests BOTTLES DRAWN AEROBIC AND ANAEROBIC 5CC  Final   Culture NO GROWTH 2 DAYS  Final   Report Status PENDING  Incomplete  Culture, routine-abscess     Status: None (Preliminary result)   Collection Time: 11/29/2015  9:04 PM  Result Value Ref Range Status   Specimen Description ABSCESS  Final   Special Requests   Final    LEFT HAND PALMAR ABSCESS OVER MEDIAL METACARPALS PATIENT ON FOLLOWING VANCOMYCIN   Gram Stain   Final    FEW WBC  PRESENT,BOTH PMN AND MONONUCLEAR NO SQUAMOUS EPITHELIAL CELLS SEEN FEW GRAM POSITIVE COCCI IN PAIRS FEW GRAM POSITIVE RODS RARE GRAM NEGATIVE RODS Performed at [facility]    Culture   Final    Culture reincubated for better growth Performed at [facility]    Report Status PENDING  Incomplete  Anaerobic culture     Status: None (Preliminary result)   Collection Time: 11/20/2015  9:05 PM  Result Value Ref Range Status   Specimen Description ABSCESS  Final   Special Requests   Final    LEFT HAND PALMAR ABSCESS OVER MEDIAL METACARPALS PATIENT ON FOLLOWING VANCOMYCIN   Gram Stain   Final    FEW WBC PRESENT,BOTH PMN AND MONONUCLEAR NO SQUAMOUS EPITHELIAL CELLS SEEN MODERATE  GRAM POSITIVE COCCI IN PAIRS FEW GRAM POSITIVE RODS RARE GRAM NEGATIVE RODS Performed at [facility]    Culture   Final    NO ANAEROBES ISOLATED; CULTURE IN PROGRESS FOR 5 DAYS Performed at [facility]    Report Status PENDING  Incomplete  MRSA PCR Screening     Status: None   Collection Time: 11/12/15  2:15 AM  Result Value Ref Range Status   MRSA by PCR NEGATIVE NEGATIVE Final    Comment:        The GeneXpert MRSA Assay (FDA approved for NASAL specimens only), is one component of a comprehensive MRSA colonization surveillance program. It is not intended to diagnose MRSA infection nor to guide or monitor treatment for MRSA infections.      Studies: No results found.  Scheduled Meds: . acidophilus  1 capsule Oral Daily  . aspirin EC  81 mg Oral QHS  . cinacalcet  60 mg Oral Q breakfast  . doxercalciferol  0.5 mcg Oral Q M,W,F-HD  . gabapentin  200 mg Oral BID  . hydrocortisone   Rectal BID  . insulin aspart  0-5 Units Subcutaneous QHS  . insulin aspart  0-9 Units Subcutaneous TID WC  . insulin aspart  4 Units Subcutaneous TID WC  . insulin detemir  50 Units Subcutaneous QHS  . midodrine  20 mg Oral TID WC  . multivitamin  1 tablet Oral QHS  . pantoprazole   40 mg Oral Daily  . sevelamer carbonate  4,000 mg Oral TID WC  . sodium chloride  500 mL Intravenous Once  . vancomycin  1,000 mg Intravenous Q M,W,F-HD   Continuous Infusions: . sodium chloride 10 mL/hr at 11/09/2015 1704      Time spent:35 minutes    Velvet Bathe  [facility] Pager [phone] If 7PM-7AM, please contact night-coverage at [email or web address], password [facility] 11/13/2015, 6:54 PM  LOS: 2 days
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
## 2015-11-13 NOTE — Progress Notes (Signed)
Pt BP 85/44, MAP 57; pt adament about refusing fluids. Pt is not symptomatic, A&Ox4, paged Tylene Fantasia, NP; NP is familiar with patient, unable to receive additional dose of midodrine as pt is on max dose, no new orders received. Will continue to monitor closely.

## 2015-11-13 NOTE — Progress Notes (Signed)
Patient's temperature is 103.1 orally.  She refuses Tylenol as she states it lowers her BP too much.  Room temperature set to lowest setting, covers removed from patient, cool compresses applied to forehead, neck, chest, and feet.  NP notified. No new orders at this time. Will continue to monitor and reassess patient.

## 2015-11-14 ENCOUNTER — Inpatient Hospital Stay (HOSPITAL_COMMUNITY): Payer: Medicare Other | Admitting: Certified Registered Nurse Anesthetist

## 2015-11-14 ENCOUNTER — Encounter (HOSPITAL_COMMUNITY): Admission: EM | Disposition: E | Payer: Self-pay | Source: Home / Self Care | Attending: Family Medicine

## 2015-11-14 HISTORY — PX: DRESSING CHANGE UNDER ANESTHESIA: SHX5237

## 2015-11-14 HISTORY — PX: INCISION AND DRAINAGE OF WOUND: SHX1803

## 2015-11-14 LAB — CULTURE, ROUTINE-ABSCESS

## 2015-11-14 LAB — CBC
HCT: 36.1 % (ref 36.0–46.0)
Hemoglobin: 9.9 g/dL — ABNORMAL LOW (ref 12.0–15.0)
MCH: 25.4 pg — AB (ref 26.0–34.0)
MCHC: 27.4 g/dL — ABNORMAL LOW (ref 30.0–36.0)
MCV: 92.6 fL (ref 78.0–100.0)
PLATELETS: 228 10*3/uL (ref 150–400)
RBC: 3.9 MIL/uL (ref 3.87–5.11)
RDW: 18.5 % — AB (ref 11.5–15.5)
WBC: 12.6 10*3/uL — ABNORMAL HIGH (ref 4.0–10.5)

## 2015-11-14 LAB — RENAL FUNCTION PANEL
Albumin: 2.1 g/dL — ABNORMAL LOW (ref 3.5–5.0)
Anion gap: 19 — ABNORMAL HIGH (ref 5–15)
BUN: 38 mg/dL — AB (ref 6–20)
CALCIUM: 8.2 mg/dL — AB (ref 8.9–10.3)
CHLORIDE: 94 mmol/L — AB (ref 101–111)
CO2: 22 mmol/L (ref 22–32)
CREATININE: 4.08 mg/dL — AB (ref 0.44–1.00)
GFR, EST AFRICAN AMERICAN: 14 mL/min — AB (ref >60)
GFR, EST NON AFRICAN AMERICAN: 12 mL/min — AB (ref >60)
Glucose, Bld: 217 mg/dL — ABNORMAL HIGH (ref 65–99)
Phosphorus: 3.9 mg/dL (ref 2.5–4.6)
Potassium: 3.9 mmol/L (ref 3.5–5.1)
SODIUM: 135 mmol/L (ref 135–145)

## 2015-11-14 LAB — GLUCOSE, CAPILLARY
GLUCOSE-CAPILLARY: 277 mg/dL — AB (ref 65–99)
GLUCOSE-CAPILLARY: 247 mg/dL — AB (ref 65–99)
GLUCOSE-CAPILLARY: 209 mg/dL — AB (ref 65–99)
GLUCOSE-CAPILLARY: 261 mg/dL — AB (ref 65–99)
Glucose-Capillary: 312 mg/dL — ABNORMAL HIGH (ref 65–99)

## 2015-11-14 LAB — POCT I-STAT 4, (NA,K, GLUC, HGB,HCT)
GLUCOSE: 278 mg/dL — AB (ref 65–99)
HEMATOCRIT: 32 % — AB (ref 36.0–46.0)
Hemoglobin: 10.9 g/dL — ABNORMAL LOW (ref 12.0–15.0)
Potassium: 4.1 mmol/L (ref 3.5–5.1)
Sodium: 132 mmol/L — ABNORMAL LOW (ref 135–145)

## 2015-11-14 SURGERY — IRRIGATION AND DEBRIDEMENT WOUND
Anesthesia: General | Site: Hand | Laterality: Right

## 2015-11-14 MED ORDER — SODIUM CHLORIDE 0.9 % IV BOLUS (SEPSIS): 250.0000 mL | Freq: Once | INTRAVENOUS | Status: DC

## 2015-11-14 MED ORDER — SODIUM CHLORIDE 0.9 % IV SOLN
INTRAVENOUS | Status: DC
  Administered 2015-11-14 – 2015-11-19 (×3): via INTRAVENOUS

## 2015-11-14 MED ORDER — LIDOCAINE HCL (CARDIAC) 20 MG/ML IV SOLN
INTRAVENOUS | Status: DC | PRN
  Administered 2015-11-14: 40 mg via INTRAVENOUS

## 2015-11-14 MED ORDER — SODIUM CHLORIDE 0.9 % IV SOLN: 100.0000 mL | INTRAVENOUS | Status: DC | PRN

## 2015-11-14 MED ORDER — ONDANSETRON HCL 4 MG/2ML IJ SOLN
INTRAMUSCULAR | Status: DC | PRN
  Administered 2015-11-14: 4 mg via INTRAVENOUS

## 2015-11-14 MED ORDER — PROPOFOL 10 MG/ML IV BOLUS
INTRAVENOUS | Status: DC | PRN
  Administered 2015-11-14: 70 mg via INTRAVENOUS

## 2015-11-14 MED ORDER — HEPARIN SODIUM (PORCINE) 1000 UNIT/ML DIALYSIS
1000.0000 [IU] | INTRAMUSCULAR | Status: DC | PRN
  Filled 2015-11-14: qty 1

## 2015-11-14 MED ORDER — PHENYLEPHRINE HCL 10 MG/ML IJ SOLN
10.0000 mg | INTRAVENOUS | Status: DC | PRN
  Administered 2015-11-14: 30 ug/min via INTRAVENOUS

## 2015-11-14 MED ORDER — INSULIN ASPART 100 UNIT/ML ~~LOC~~ SOLN
8.0000 [IU] | Freq: Once | SUBCUTANEOUS | Status: AC
  Administered 2015-11-14: 8 [IU] via SUBCUTANEOUS

## 2015-11-14 MED ORDER — PENTAFLUOROPROP-TETRAFLUOROETH EX AERO: 1.0000 "application " | INHALATION_SPRAY | CUTANEOUS | Status: DC | PRN

## 2015-11-14 MED ORDER — LIDOCAINE HCL (PF) 1 % IJ SOLN: 5.0000 mL | INTRAMUSCULAR | Status: DC | PRN

## 2015-11-14 MED ORDER — IBUPROFEN 200 MG PO TABS
400.0000 mg | ORAL_TABLET | Freq: Once | ORAL | Status: AC
  Administered 2015-11-14: 400 mg via ORAL
  Filled 2015-11-14: qty 2

## 2015-11-14 MED ORDER — SUGAMMADEX SODIUM 200 MG/2ML IV SOLN
INTRAVENOUS | Status: DC | PRN
  Administered 2015-11-14: 200 mg via INTRAVENOUS

## 2015-11-14 MED ORDER — 0.9 % SODIUM CHLORIDE (POUR BTL) OPTIME
TOPICAL | Status: DC | PRN
  Administered 2015-11-14 (×2): 1000 mL

## 2015-11-14 MED ORDER — MIDAZOLAM HCL 5 MG/5ML IJ SOLN
INTRAMUSCULAR | Status: DC | PRN
  Administered 2015-11-14: 2 mg via INTRAVENOUS

## 2015-11-14 MED ORDER — HEPARIN SODIUM (PORCINE) 1000 UNIT/ML DIALYSIS
20.0000 [IU]/kg | INTRAMUSCULAR | Status: DC | PRN
  Filled 2015-11-14: qty 3

## 2015-11-14 MED ORDER — LIDOCAINE-EPINEPHRINE 1 %-1:100000 IJ SOLN
INTRAMUSCULAR | Status: AC
  Filled 2015-11-14: qty 1

## 2015-11-14 MED ORDER — HYDROMORPHONE HCL 1 MG/ML IJ SOLN: 0.2500 mg | INTRAMUSCULAR | Status: DC | PRN

## 2015-11-14 MED ORDER — PHENYLEPHRINE HCL 10 MG/ML IJ SOLN
INTRAMUSCULAR | Status: DC | PRN
  Administered 2015-11-14: 160 ug via INTRAVENOUS

## 2015-11-14 MED ORDER — BACITRACIN ZINC 500 UNIT/GM EX OINT
TOPICAL_OINTMENT | CUTANEOUS | Status: AC
  Filled 2015-11-14: qty 28.35

## 2015-11-14 MED ORDER — ALTEPLASE 2 MG IJ SOLR: 2.0000 mg | Freq: Once | INTRAMUSCULAR | Status: DC | PRN

## 2015-11-14 MED ORDER — LIDOCAINE-PRILOCAINE 2.5-2.5 % EX CREA
1.0000 "application " | TOPICAL_CREAM | CUTANEOUS | Status: DC | PRN
  Filled 2015-11-14: qty 5

## 2015-11-14 MED ORDER — ROCURONIUM BROMIDE 100 MG/10ML IV SOLN
INTRAVENOUS | Status: DC | PRN
  Administered 2015-11-14: 30 mg via INTRAVENOUS

## 2015-11-14 MED ORDER — SUCCINYLCHOLINE CHLORIDE 20 MG/ML IJ SOLN
INTRAMUSCULAR | Status: DC | PRN
  Administered 2015-11-14: 100 mg via INTRAVENOUS

## 2015-11-14 SURGICAL SUPPLY — 56 items
BNDG CMPR 9X4 STRL LF SNTH (GAUZE/BANDAGES/DRESSINGS)
BNDG COHESIVE 4X5 TAN STRL (GAUZE/BANDAGES/DRESSINGS) ×4 IMPLANT
BNDG ESMARK 4X9 LF (GAUZE/BANDAGES/DRESSINGS) IMPLANT
BNDG GAUZE ELAST 4 BULKY (GAUZE/BANDAGES/DRESSINGS) ×6 IMPLANT
CHLORAPREP W/TINT 26ML (MISCELLANEOUS) ×4 IMPLANT
CLEANER TIP ELECTROSURG 2X2 (MISCELLANEOUS) ×2 IMPLANT
CORDS BIPOLAR (ELECTRODE) ×2 IMPLANT
COVER SURGICAL LIGHT HANDLE (MISCELLANEOUS) ×4 IMPLANT
CUFF TOURNIQUET SINGLE 18IN (TOURNIQUET CUFF) ×4 IMPLANT
CUFF TOURNIQUET SINGLE 24IN (TOURNIQUET CUFF) IMPLANT
DECANTER SPIKE VIAL GLASS SM (MISCELLANEOUS) IMPLANT
DRAPE SURG 17X23 STRL (DRAPES) ×4 IMPLANT
DRSG ADAPTIC 3X8 NADH LF (GAUZE/BANDAGES/DRESSINGS) ×4 IMPLANT
ELECT CAUTERY BLADE 6.4 (BLADE) IMPLANT
ELECT REM PT RETURN 9FT ADLT (ELECTROSURGICAL)
ELECTRODE REM PT RTRN 9FT ADLT (ELECTROSURGICAL) IMPLANT
EVACUATOR 1/8 PVC DRAIN (DRAIN) IMPLANT
GAUZE SPONGE 4X4 12PLY STRL (GAUZE/BANDAGES/DRESSINGS) ×4 IMPLANT
GLOVE BIO SURGEON STRL SZ 6.5 (GLOVE) ×3 IMPLANT
GLOVE BIO SURGEON STRL SZ7.5 (GLOVE) ×2 IMPLANT
GLOVE BIO SURGEONS STRL SZ 6.5 (GLOVE) ×1
GLOVE BIOGEL PI IND STRL 7.0 (GLOVE) ×2 IMPLANT
GLOVE BIOGEL PI IND STRL 8 (GLOVE) ×2 IMPLANT
GLOVE BIOGEL PI INDICATOR 7.0 (GLOVE) ×2
GLOVE BIOGEL PI INDICATOR 8 (GLOVE) ×2
GOWN STRL REUS W/ TWL LRG LVL3 (GOWN DISPOSABLE) ×6 IMPLANT
GOWN STRL REUS W/TWL LRG LVL3 (GOWN DISPOSABLE) ×12
HANDPIECE INTERPULSE COAX TIP (DISPOSABLE)
KIT BASIN OR (CUSTOM PROCEDURE TRAY) ×4 IMPLANT
KIT ROOM TURNOVER OR (KITS) ×4 IMPLANT
MANIFOLD NEPTUNE II (INSTRUMENTS) ×4 IMPLANT
NDL HYPO 25GX1X1/2 BEV (NEEDLE) IMPLANT
NEEDLE HYPO 25GX1X1/2 BEV (NEEDLE) IMPLANT
NS IRRIG 1000ML POUR BTL (IV SOLUTION) ×4 IMPLANT
PACK ORTHO EXTREMITY (CUSTOM PROCEDURE TRAY) ×4 IMPLANT
PAD ARMBOARD 7.5X6 YLW CONV (MISCELLANEOUS) ×8 IMPLANT
PAD CAST 4YDX4 CTTN HI CHSV (CAST SUPPLIES) IMPLANT
PADDING CAST COTTON 4X4 STRL (CAST SUPPLIES) ×4
SET CYSTO W/LG BORE CLAMP LF (SET/KITS/TRAYS/PACK) IMPLANT
SET HNDPC FAN SPRY TIP SCT (DISPOSABLE) IMPLANT
SET IRRIG Y TYPE TUR BLADDER L (SET/KITS/TRAYS/PACK) IMPLANT
SPONGE LAP 18X18 X RAY DECT (DISPOSABLE) IMPLANT
STOCKINETTE IMPERVIOUS 9X36 MD (GAUZE/BANDAGES/DRESSINGS) IMPLANT
SUT ETHILON 4 0 PS 2 18 (SUTURE) ×4 IMPLANT
SUT PROLENE 1 CT (SUTURE) IMPLANT
SUT VICRYL RAPIDE 4/0 PS 2 (SUTURE) IMPLANT
SYRINGE 10CC LL (SYRINGE) IMPLANT
TOWEL OR 17X24 6PK STRL BLUE (TOWEL DISPOSABLE) ×4 IMPLANT
TOWEL OR 17X26 10 PK STRL BLUE (TOWEL DISPOSABLE) ×4 IMPLANT
TUBE ANAEROBIC SPECIMEN COL (MISCELLANEOUS) IMPLANT
TUBE CONNECTING 12'X1/4 (SUCTIONS) ×1
TUBE CONNECTING 12X1/4 (SUCTIONS) ×3 IMPLANT
TUBING CYSTO DISP (UROLOGICAL SUPPLIES) ×4 IMPLANT
UNDERPAD 30X30 INCONTINENT (UNDERPADS AND DIAPERS) ×4 IMPLANT
WATER STERILE IRR 1000ML POUR (IV SOLUTION) ×4 IMPLANT
YANKAUER SUCT BULB TIP NO VENT (SUCTIONS) ×4 IMPLANT

## 2015-11-14 NOTE — Progress Notes (Signed)
[facility] PROGRESS NOTE  [name] [id number] DOB: May 30, 1966 DOA: 11/09/2015 PCP: Arnette Norris, MD  Brief narrative 50 year old female with history of chronic hypotension on midodrine, uncontrolled diabetes with peripheral vascular disease, end-stage renal disease on dialysis (M, W, F), history of staph aureus bacteremia with sepsis 2008, osteomyelitis of multiple digits of her hands and feet status post amputation, GERD, who is wheelchair-bound presented to the ED with fever and infection of her right middle finger. She reported having of fever of 101F at home. In the ED she had a temperature of 99.8 Fahrenheit, hypotensive with blood pressure 73/40 mmHg, normal heart rate and normal O2 sat. She was found to have necrosis of multiple fingertips of both her hands. Lactic acid of 5.74. She received 1 L IV normal saline bolus and IV vancomycin. Patient admitted to hospitalist service on stepdown. Seen by hand surgeon and underwent amputation of left thumb, left index finger, right small finger, right ring finger and right hand incision and drainage of mid palmar space with radical flexor tenosynovectomy, flexor tendon excision and open carpal tunnel release. Also had excisional debridement of partial-thickness skin of right dorsal hand and volar wrist wounds.    Assessment/Plan: Sepsis secondary to multiple infected fingers Taken to OR with findings as above. Continue empiric vancomycin. Follow cultures.  -Patient was febrile to 102.44F has refused Tylenol stating it makes her hypotensive. Will monitor. -Follow WBC.  Hypotension Chronic and is on midodrine during dialysis (patient reports taking it every day).  - Patient refusing fluid boluses or IV medication to assist with hypotension. Speaking and alert. She reports that she has chronic low blood pressures and has been this way for years. I indicated that the infection may be contributing but patient did not want to try any new  measures.   Vulvovaginal candidiasis - Diflucan per pharmacy consult  Hemorrhoids: new problem - Continue Anusol  End-stage renal disease on dialysis Renal following and managing. Continue Sensipar, hectoral  Uncontrolled mellitus Continue Lantus with premeal aspart coverage. A1c of 10.1.  Peripheral vascular disease Continue aspirin  DVT prophylaxis: subcutaneous heparin  Diet: Renal/diabetic  Code Status: Full code Family Communication: None at bedside Disposition Plan: Continue step down monitoring. PT eval    Consultants:  Nephrology  Hand surgery  Procedures:  Patient of multiple fingers, right hand I&D on 3/13  Antibiotics:   IV vancomycin since 3/13  HPI/Subjective: Patient has no new complaints today. Reports she doesn't want fluid boluses or pressors.   Objective: Filed Vitals:   11/05/2015 0800 11/27/2015 0834  BP: 72/37 85/42  Pulse:    Temp:    Resp: 12 13    Intake/Output Summary (Last 24 hours) at 11/25/2015 1005 Last data filed at 11/13/15 1150  Gross per 24 hour  Intake      0 ml  Output   4111 ml  Net  -4111 ml   Filed Weights   11/13/15 0640 11/13/15 1150 11/08/2015 0400  Weight: 109.7 kg (241 lb 13.5 oz) 104.9 kg (231 lb 4.2 oz) 103.874 kg (229 lb)    Exam:   General:  Middle aged female, in nad  HEENT: Moist mucosa, supple neck  Chest: Clear bilaterally, no wheezes  CVS: Normal S1-S2, no murmurs or gallop  GI: Soft, nondistended, nontender, bowel sounds present  Musculoskeletal: Dressing over right hand intact, dressing over left thumb and index finger intact  CNS: Alert and oriented    Data Reviewed: Basic Metabolic Panel:  Recent Labs Lab 11/10/2015  1145 11/10/2015 1314 11/12/15 0448 11/13/15 0540 11/13/15 0657  NA 135  --  138 131* 131*  K 4.7  --  3.9 4.1 4.1  CL 90*  --  94* 91* 92*  CO2 19*  --  24 24 24   GLUCOSE 382*  --  190* 282* 270*  BUN 68*  --  23* 39* 41*  CREATININE 6.92* 6.90* 3.58* 5.31* 5.19*   CALCIUM 7.6*  --  7.9* 7.5* 7.4*  PHOS  --   --   --   --  5.3*   Liver Function Tests:  Recent Labs Lab 11/25/2015 1145 11/12/15 0448 11/13/15 0657  AST 28 20  --   ALT 15 13*  --   ALKPHOS 280* 209*  --   BILITOT 1.1 1.4*  --   PROT 6.9 6.1*  --   ALBUMIN 2.7* 2.4* 2.1*   No results for input(s): LIPASE, AMYLASE in the last 168 hours. No results for input(s): AMMONIA in the last 168 hours. CBC:  Recent Labs Lab 11/29/2015 1145 11/12/15 0448 11/13/15 0540 11/13/15 0657  WBC 19.3* 17.5* 16.4* 15.3*  NEUTROABS 17.0*  --   --   --   HGB 11.0* 9.5* 9.0* 8.8*  HCT 37.6 32.8* 31.9* 30.0*  MCV 89.7 90.1 90.9 89.6  PLT 213 167 207 184   Cardiac Enzymes: No results for input(s): CKTOTAL, CKMB, CKMBINDEX, TROPONINI in the last 168 hours. BNP (last 3 results) No results for input(s): BNP in the last 8760 hours.  ProBNP (last 3 results) No results for input(s): PROBNP in the last 8760 hours.  CBG:  Recent Labs Lab 11/12/15 2102 11/13/15 1238 11/13/15 1639 11/13/15 2147 11/05/2015 0758  GLUCAP 287* 152* 372* 242* 312*    Recent Results (from the past 240 hour(s))  Culture, blood (routine x 2)     Status: None (Preliminary result)   Collection Time: 11/05/2015 11:02 AM  Result Value Ref Range Status   Specimen Description BLOOD RIGHT ANTECUBITAL  Final   Special Requests BOTTLES DRAWN AEROBIC AND ANAEROBIC 5CC  Final   Culture NO GROWTH 2 DAYS  Final   Report Status PENDING  Incomplete  Culture, blood (routine x 2)     Status: None (Preliminary result)   Collection Time: 11/01/2015 11:11 AM  Result Value Ref Range Status   Specimen Description BLOOD BLOOD RIGHT FOREARM  Final   Special Requests BOTTLES DRAWN AEROBIC AND ANAEROBIC 5CC  Final   Culture NO GROWTH 2 DAYS  Final   Report Status PENDING  Incomplete  Culture, routine-abscess     Status: None   Collection Time: 11/01/2015  9:04 PM  Result Value Ref Range Status   Specimen Description ABSCESS  Final   Special  Requests   Final    LEFT HAND PALMAR ABSCESS OVER MEDIAL METACARPALS PATIENT ON FOLLOWING VANCOMYCIN   Gram Stain   Final    FEW WBC PRESENT,BOTH PMN AND MONONUCLEAR NO SQUAMOUS EPITHELIAL CELLS SEEN FEW GRAM POSITIVE COCCI IN PAIRS FEW GRAM POSITIVE RODS RARE GRAM NEGATIVE RODS Performed at [facility]    Culture   Final    MULTIPLE ORGANISMS PRESENT, NONE PREDOMINANT Note: NO STAPHYLOCOCCUS AUREUS ISOLATED NO GROUP A STREP (S.PYOGENES) ISOLATED Performed at [facility]    Report Status 11/21/2015 FINAL  Final  Anaerobic culture     Status: None (Preliminary result)   Collection Time: 11/29/2015  9:05 PM  Result Value Ref Range Status   Specimen Description ABSCESS  Final  Special Requests   Final    LEFT HAND PALMAR ABSCESS OVER MEDIAL METACARPALS PATIENT ON FOLLOWING VANCOMYCIN   Gram Stain   Final    FEW WBC PRESENT,BOTH PMN AND MONONUCLEAR NO SQUAMOUS EPITHELIAL CELLS SEEN MODERATE GRAM POSITIVE COCCI IN PAIRS FEW GRAM POSITIVE RODS RARE GRAM NEGATIVE RODS Performed at [facility]    Culture   Final    NO ANAEROBES ISOLATED; CULTURE IN PROGRESS FOR 5 DAYS Performed at [facility]    Report Status PENDING  Incomplete  MRSA PCR Screening     Status: None   Collection Time: 11/12/15  2:15 AM  Result Value Ref Range Status   MRSA by PCR NEGATIVE NEGATIVE Final    Comment:        The GeneXpert MRSA Assay (FDA approved for NASAL specimens only), is one component of a comprehensive MRSA colonization surveillance program. It is not intended to diagnose MRSA infection nor to guide or monitor treatment for MRSA infections.      Studies: No results found.  Scheduled Meds: . acidophilus  1 capsule Oral Daily  . aspirin EC  81 mg Oral QHS  . cinacalcet  60 mg Oral Q breakfast  . doxercalciferol  0.5 mcg Oral Q M,W,F-HD  . gabapentin  200 mg Oral BID  . hydrocortisone   Rectal BID  . insulin aspart  0-5 Units Subcutaneous QHS  .  insulin aspart  0-9 Units Subcutaneous TID WC  . insulin aspart  4 Units Subcutaneous TID WC  . insulin detemir  50 Units Subcutaneous QHS  . midodrine  20 mg Oral TID WC  . multivitamin  1 tablet Oral QHS  . pantoprazole  40 mg Oral Daily  . sevelamer carbonate  4,000 mg Oral TID WC  . sodium chloride  250 mL Intravenous Once  . sodium chloride  500 mL Intravenous Once  . vancomycin  1,000 mg Intravenous Q M,W,F-HD   Continuous Infusions: . sodium chloride 10 mL/hr at 11/25/2015 1704      Time spent:35 minutes    Velvet Bathe  [facility] Pager [phone] If 7PM-7AM, please contact night-coverage at [email or web address], password [facility] 11/17/2015, 10:05 AM  LOS: 3 days

## 2015-11-14 NOTE — Anesthesia Procedure Notes (Signed)
Procedure Name: Intubation Date/Time: 11/02/2015 1:46 PM Performed by: Salli Quarry Ayako Tapanes Pre-anesthesia Checklist: Patient identified, Emergency Drugs available, Suction available and Patient being monitored Patient Re-evaluated:Patient Re-evaluated prior to inductionOxygen Delivery Method: Circle system utilized Preoxygenation: Pre-oxygenation with 100% oxygen Intubation Type: IV induction Ventilation: Mask ventilation without difficulty Laryngoscope Size: Mac and 3 Grade View: Grade I Tube type: Oral Tube size: 7.5 mm Number of attempts: 1 Airway Equipment and Method: Stylet Placement Confirmation: ETT inserted through vocal cords under direct vision,  positive ETCO2 and breath sounds checked- equal and bilateral Secured at: 22 cm Tube secured with: Tape Dental Injury: Teeth and Oropharynx as per pre-operative assessment

## 2015-11-14 NOTE — Progress Notes (Signed)
Patients bp=72/37 and patient complaining about ringing in her ears. Patient is completely alert and oriented x4. Paged Dr. Wendee Beavers. 250cc bolus ordered. Patient is refusing fluid bolus and wants more midodrine. Patient is on maximum amount of midodrine and cannot receive any more than what is already ordered. Will continue to monitor.

## 2015-11-14 NOTE — Progress Notes (Signed)
Follow up bp is 85/42, pt has no complaints at this time. Will continue to closely monitor.

## 2015-11-14 NOTE — Op Note (Signed)
11/02/2015 - 11/01/2015  2:49 PM  PATIENT:  [name]  50 y.o. female  PRE-OPERATIVE DIAGNOSIS:  Right hand and forearm deep infection  POST-OPERATIVE DIAGNOSIS:  Same  PROCEDURE:  Excision of skin, subcutaneous tissue, tenosynovium right hand and forearm; flexor tendon excision 4; simple closure of wound 25 cm  SURGEON: Rayvon Char. Grandville Silos, MD  PHYSICIAN ASSISTANT: Morley Kos, OPA-C  ANESTHESIA:  general  SPECIMENS:  None  DRAINS:   None  EBL:  less than 50 mL  PREOPERATIVE INDICATIONS:  [name] is a  50 y.o. female with deep volar space infection of right hand and forearm.  The risks benefits and alternatives were discussed with the patient preoperatively including but not limited to the risks of infection, bleeding, nerve injury, cardiopulmonary complications, the need for revision surgery, among others, and the patient verbalized understanding and consented to proceed.  OPERATIVE IMPLANTS: None  OPERATIVE PROCEDURE:  The patient was escorted to the operative theatre and placed in a supine position.  A surgical "time-out" was performed during which the planned procedure, proposed operative site, and the correct patient identity were compared to the operative consent and agreement confirmed by the circulating nurse according to current facility policy.  Following application of a tourniquet to the operative extremity, the exposed skin was pre-scrub with Hibiclens scrub brush before being formally prepped with Betadine and draped in the usual sterile fashion.  The limb was exsanguinated with gravity and the tourniquet inflated to approximately 114mmHg higher than systolic BP.  The left side dressing was removed, the wounds found to be healthy in appearance, with some dried serosanguineous drainage at the margins, no significant redness, no active drainage.  Decision was made to leave that without further dressings.  Sutures removed from the previous incision.   There was blistering across the palm, the superficial skin layer was debrided.  There was an area about the size of a dime and hyperthenar eminence with the skin had turned into black eschar, this was not further debrided.  It was noted at the proximal end of it purulent material extended proximally this and so the incision was carried in a curvilinear fashion up the forearm, ultimately to the midforearm.  Four flexor tendon stumps from previous digital amputations were excised back at the muscle tendon junction.  Tenosynovium was further excised from all the remaining flexor tendons back to the muscle tendon junction.  Some of the intermuscular septum was excised by pulling out dead strips with a rongeur.  Through the carpal tunnel down into the hand, snotty infected necrotic debris was excised with rongeur and scissors.  All of this was then copiously irrigated.  The tourniquet was released, some additional hemostasis obtained and the wound was closed loosely with 4-0 nylon interrupted sutures.  Bacitracin ointment was placed in the opening of the wound to help prevent desiccation of any superficial structures.  A bulky dressing was applied and she was awakened and taken recovery in stable condition, breathing spontaneously  DISPOSITION: She'll return to the floor for inpatient care, returning the operating room on Saturday morning for repeat evaluation and debridement/irrigation as findings dictate.

## 2015-11-14 NOTE — Progress Notes (Addendum)
I&D extended to mid-forearm as pus was found tracking along flexor tendons that high. Will need return to OR on Saturday morning for repeat I&D for right side.  Left side looks healthy, no need for dressings unless drainage develops.  Recommend ID consult/involvement for long-term antibiotic management.  Micheline Rough, MD [facility] [phone]

## 2015-11-14 NOTE — Progress Notes (Signed)
Inpatient Diabetes Program Recommendations  AACE/ADA: New Consensus Statement on Inpatient Glycemic Control (2015)  Target Ranges:  Prepandial:   less than 140 mg/dL      Peak postprandial:   less than 180 mg/dL (1-2 hours)      Critically ill patients:  140 - 180 mg/dL   Review of Glycemic Control  Results for [name], [name] (MRN [id number]) as of 11/05/2015 13:23  Ref. Range 11/12/2015 11:55 11/12/2015 16:16 11/12/2015 21:02 11/13/2015 12:38 11/13/2015 16:39 11/13/2015 21:47 11/20/2015 07:58 11/13/2015 12:59  Glucose-Capillary Latest Ref Range: 65-99 mg/dL 329 (H) 349 (H) 287 (H) 152 (H) 372 (H) 242 (H) 312 (H) 277 (H)    Diabetes history: Type 2, A1C 10.1% Outpatient Diabetes medications: Levemir 50 units qday, Novolog 20 units tid Current orders for Inpatient glycemic control: Levemir 50 units qhs, Novolog 0-9 units tid, Novolog 0-9 units qhs, Novolog 4 units tid with meals  Inpatient Diabetes Program Recommendations: Fasting blood sugar elevated- consider increasing Lantus to 56 units qhs .   Gentry Fitz, RN, BA, MHA, CDE Diabetes Coordinator Inpatient Diabetes Program  [phone] (Team Pager) [phone] ([place]) 11/13/2015 1:51 PM

## 2015-11-14 NOTE — Anesthesia Preprocedure Evaluation (Signed)
Anesthesia Evaluation  Patient identified by MRN, date of birth, ID band Patient awake    Reviewed: Allergy & Precautions, H&P , NPO status , Patient's Chart, lab work & pertinent test results  Airway Mallampati: II  TM Distance: >3 FB     Dental no notable dental hx. (+) Teeth Intact, Dental Advisory Given   Pulmonary neg pulmonary ROS,    Pulmonary exam normal breath sounds clear to auscultation       Cardiovascular negative cardio ROS   Rhythm:Regular Rate:Normal     Neuro/Psych negative neurological ROS     GI/Hepatic Neg liver ROS, GERD  Medicated and Controlled,  Endo/Other  diabetes, Insulin DependentMorbid obesity  Renal/GU ESRF and DialysisRenal disease  negative genitourinary   Musculoskeletal   Abdominal   Peds  Hematology negative hematology ROS (+)   Anesthesia Other Findings   Reproductive/Obstetrics negative OB ROS                             Anesthesia Physical Anesthesia Plan  ASA: III  Anesthesia Plan: General   Post-op Pain Management:    Induction: Intravenous  Airway Management Planned: LMA  Additional Equipment:   Intra-op Plan:   Post-operative Plan: Extubation in OR  Informed Consent: I have reviewed the patients History and Physical, chart, labs and discussed the procedure including the risks, benefits and alternatives for the proposed anesthesia with the patient or authorized representative who has indicated his/her understanding and acceptance.   Dental advisory given  Plan Discussed with: CRNA  Anesthesia Plan Comments:         Anesthesia Quick Evaluation

## 2015-11-14 NOTE — Transfer of Care (Signed)
Immediate Anesthesia Transfer of Care Note  Patient: [name]  Procedure(s) Performed: Procedure(s): IRRIGATION AND DEBRIDEMENT WOUND (Right) DRESSING CHANGE UNDER ANESTHESIA (Left)  Patient Location: PACU  Anesthesia Type:General  Level of Consciousness: lethargic  Airway & Oxygen Therapy: Patient Spontanous Breathing and Patient connected to nasal cannula oxygen  Post-op Assessment: Report given to RN and Post -op Vital signs reviewed and stable  Post vital signs: Reviewed and stable  Last Vitals:  Filed Vitals:   11/20/2015 1132 11/03/2015 1307  BP: 88/52 80/37  Pulse:    Temp:    Resp: 20     Complications: No apparent anesthesia complications

## 2015-11-14 NOTE — Consult Note (Addendum)
   [facility] CM Inpatient Consult   11/18/2015  SONDI GRIBBON Jun 09, 1966 [id number]   [facility] Care Management EPIC consult received. Will follow up. Noted patient currently on stepdown unit.  Marthenia Rolling, MSN-Ed, RN,BSN [facility] Liaison [phone]

## 2015-11-14 NOTE — Progress Notes (Signed)
Sepsis secondary to wounds R. Hand, s/p partial digital amputations 1. ESRD - MWF at [facility]. Next HD in AM 2. 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 Anemia - No esa. Follow HGB  4. Nutrition - Albumin 2.7.  5. DM: per primary. 6. Constipation Rx Lactulos  Subjective: Interval History: 4 liters off with HD yesterday  Objective: Vital signs in last 24 hours: Temp:  [98.8 F (37.1 C)-100.6 F (38.1 C)] 99.2 F (37.3 C) (03/16 0755) Pulse Rate:  [70-80] 80 (03/16 0300) Resp:  [12-20] 20 (03/16 1132) BP: (55-100)/(37-53) 80/37 mmHg (03/16 1307) SpO2:  [94 %-100 %] 100 % (03/16 0800) Weight:  [103.874 kg (229 lb)] 103.874 kg (229 lb) (03/16 0400) Weight change: -5.8 kg (-12 lb 12.6 oz)  Intake/Output from previous day: 03/15 0701 - 03/16 0700 In: -  Out: 4111  Intake/Output this shift:    General appearance: alert and cooperative Resp: clear to auscultation bilaterally Chest wall: no tenderness Cardio: regular rate and rhythm, S1, S2 normal, no murmur, click, rub or gallop Extremities: edema improved  Missing digits both UEs, bandage on right  Lab Results:  Recent Labs  11/13/15 0540 11/13/15 0657  WBC 16.4* 15.3*  HGB 9.0* 8.8*  HCT 31.9* 30.0*  PLT 207 184   BMET:  Recent Labs  11/13/15 0540 11/13/15 0657  NA 131* 131*  K 4.1 4.1  CL 91* 92*  CO2 24 24  GLUCOSE 282* 270*  BUN 39* 41*  CREATININE 5.31* 5.19*  CALCIUM 7.5* 7.4*   No results for input(s): PTH in the last 72 hours. Iron Studies: No results for input(s): IRON, TIBC, TRANSFERRIN, FERRITIN in the last 72 hours. Studies/Results: No results found.  Scheduled: . [MAR Hold] acidophilus  1 capsule Oral Daily  . [MAR Hold] aspirin EC  81 mg Oral QHS  . [MAR Hold] cinacalcet  60 mg Oral Q breakfast  . [MAR Hold] doxercalciferol  0.5 mcg Oral Q M,W,F-HD  . [MAR Hold] gabapentin  200 mg Oral BID  . [MAR Hold] hydrocortisone   Rectal BID  . [MAR Hold] insulin aspart  0-5 Units Subcutaneous  QHS  . [MAR Hold] insulin aspart  0-9 Units Subcutaneous TID WC  . [MAR Hold] insulin aspart  4 Units Subcutaneous TID WC  . [MAR Hold] insulin detemir  50 Units Subcutaneous QHS  . [MAR Hold] midodrine  20 mg Oral TID WC  . [MAR Hold] multivitamin  1 tablet Oral QHS  . [MAR Hold] pantoprazole  40 mg Oral Daily  . [MAR Hold] sevelamer carbonate  4,000 mg Oral TID WC  . sodium chloride  250 mL Intravenous Once  . [MAR Hold] sodium chloride  500 mL Intravenous Once  . [MAR Hold] vancomycin  1,000 mg Intravenous Q M,W,F-HD     LOS: 3 days   [name] C 11/20/2015,1:11 PM

## 2015-11-15 ENCOUNTER — Encounter (HOSPITAL_COMMUNITY): Payer: Self-pay | Admitting: Orthopedic Surgery

## 2015-11-15 DIAGNOSIS — L03114 Cellulitis of left upper limb: Secondary | ICD-10-CM

## 2015-11-15 DIAGNOSIS — B9689 Other specified bacterial agents as the cause of diseases classified elsewhere: Secondary | ICD-10-CM

## 2015-11-15 LAB — GLUCOSE, CAPILLARY
Glucose-Capillary: 310 mg/dL — ABNORMAL HIGH (ref 65–99)
Glucose-Capillary: 235 mg/dL — ABNORMAL HIGH (ref 65–99)
Glucose-Capillary: 241 mg/dL — ABNORMAL HIGH (ref 65–99)
Glucose-Capillary: 221 mg/dL — ABNORMAL HIGH (ref 65–99)

## 2015-11-15 MED ORDER — SODIUM CHLORIDE 0.9 % IV BOLUS (SEPSIS)
250.0000 mL | Freq: Once | INTRAVENOUS | Status: AC
  Administered 2015-11-15: 250 mL via INTRAVENOUS

## 2015-11-15 MED ORDER — DEXTROSE 5 % IV SOLN
2.0000 g | INTRAVENOUS | Status: DC
  Administered 2015-11-18 – 2015-11-22 (×3): 2 g via INTRAVENOUS
  Filled 2015-11-15 (×3): qty 2

## 2015-11-15 MED ORDER — IBUPROFEN 200 MG PO TABS
600.0000 mg | ORAL_TABLET | Freq: Once | ORAL | Status: AC
  Administered 2015-11-15: 600 mg via ORAL
  Filled 2015-11-15: qty 3

## 2015-11-15 MED ORDER — DEXTROSE 5 % IV SOLN
2.0000 g | Freq: Once | INTRAVENOUS | Status: AC
  Administered 2015-11-15: 2 g via INTRAVENOUS
  Filled 2015-11-15: qty 2

## 2015-11-15 NOTE — Progress Notes (Signed)
UR COMPLETED  

## 2015-11-15 NOTE — Progress Notes (Addendum)
[facility] PROGRESS NOTE  [name] [id number] DOB: 07/16/1966 DOA: 11/19/2015 PCP: Arnette Norris, MD  Brief narrative 50 year old female with history of chronic hypotension on midodrine, uncontrolled diabetes with peripheral vascular disease, end-stage renal disease on dialysis (M, W, F), history of staph aureus bacteremia with sepsis 2008, osteomyelitis of multiple digits of her hands and feet status post amputation, GERD, who is wheelchair-bound presented to the ED with fever and infection of her right middle finger. She reported having of fever of 101F at home. In the ED she had a temperature of 99.8 Fahrenheit, hypotensive with blood pressure 73/40 mmHg, normal heart rate and normal O2 sat. She was found to have necrosis of multiple fingertips of both her hands. Lactic acid of 5.74. She received 1 L IV normal saline bolus and IV vancomycin. Patient admitted to hospitalist service on stepdown. Seen by hand surgeon and underwent amputation of left thumb, left index finger, right small finger, right ring finger and right hand incision and drainage of mid palmar space with radical flexor tenosynovectomy, flexor tendon excision and open carpal tunnel release. Also had excisional debridement of partial-thickness skin of right dorsal hand and volar wrist wounds.    Assessment/Plan: Sepsis secondary to multiple infected fingers Taken to OR with findings as above. Continue IV antibiotics as recommended by ID Tressie Ellis) -Patient has been febrile and has refused Tylenol stating it makes her hypotensive. Will monitor. -Follow WBC.  Hypotension Chronic and is on midodrine during dialysis (patient reports taking it every day).  - Patient yesterday refused fluid boluses and stated she did not want pressors should she need them. Today is hypotensive. Will administer fluid boluses and monitor.  Vulvovaginal candidiasis - Diflucan per pharmacy consult  Hemorrhoids: new problem - Continue  Anusol  End-stage renal disease on dialysis Renal following and managing.   Uncontrolled mellitus Continue Lantus with premeal aspart coverage. A1c of 10.1.  Peripheral vascular disease Continue aspirin  DVT prophylaxis: subcutaneous heparin  Diet: Renal/diabetic  Code Status: Full code Family Communication: None at bedside Disposition Plan: Continue step down monitoring. PT eval    Consultants:  Nephrology  Hand surgery  Procedures:  Patient of multiple fingers, right hand I&D on 3/13  Antibiotics:   IV vancomycin since 3/13  HPI/Subjective: Patient has been more somnolent today.  Objective: Filed Vitals:   11/15/15 1200 11/15/15 1300  BP: 70/52 72/34  Pulse: 81 79  Temp:    Resp: 23 18    Intake/Output Summary (Last 24 hours) at 11/15/15 1448 Last data filed at 11/29/2015 1700  Gross per 24 hour  Intake    240 ml  Output      0 ml  Net    240 ml   Filed Weights   11/13/15 1150 11/01/2015 0400 11/15/15 0451  Weight: 104.9 kg (231 lb 4.2 oz) 103.874 kg (229 lb) 102.967 kg (227 lb)    Exam:   General:  Middle aged female, somnolent but arrousable.  HEENT: Moist mucosa, supple neck  Chest: Clear bilaterally, no wheezes  CVS: Normal S1-S2, no murmurs or gallop  GI: Soft, nondistended, nontender, bowel sounds present  Musculoskeletal: Dressing over right hand intact, dressing over left thumb and index finger intact  CNS: addendum: pt hypersomnolent    Data Reviewed: Basic Metabolic Panel:  Recent Labs Lab 11/10/2015 1145 11/04/2015 1314 11/12/15 0448 11/13/15 0540 11/13/15 0657 11/17/2015 1327 11/02/2015 1604  NA 135  --  138 131* 131* 132* 135  K 4.7  --  3.9  4.1 4.1 4.1 3.9  CL 90*  --  94* 91* 92*  --  94*  CO2 19*  --  24 24 24   --  22  GLUCOSE 382*  --  190* 282* 270* 278* 217*  BUN 68*  --  23* 39* 41*  --  38*  CREATININE 6.92* 6.90* 3.58* 5.31* 5.19*  --  4.08*  CALCIUM 7.6*  --  7.9* 7.5* 7.4*  --  8.2*  PHOS  --   --   --   --   5.3*  --  3.9   Liver Function Tests:  Recent Labs Lab 11/10/2015 1145 11/12/15 0448 11/13/15 0657 11/01/2015 1604  AST 28 20  --   --   ALT 15 13*  --   --   ALKPHOS 280* 209*  --   --   BILITOT 1.1 1.4*  --   --   PROT 6.9 6.1*  --   --   ALBUMIN 2.7* 2.4* 2.1* 2.1*   No results for input(s): LIPASE, AMYLASE in the last 168 hours. No results for input(s): AMMONIA in the last 168 hours. CBC:  Recent Labs Lab 11/13/2015 1145 11/12/15 0448 11/13/15 0540 11/13/15 0657 11/24/2015 1327 11/17/2015 1604  WBC 19.3* 17.5* 16.4* 15.3*  --  12.6*  NEUTROABS 17.0*  --   --   --   --   --   HGB 11.0* 9.5* 9.0* 8.8* 10.9* 9.9*  HCT 37.6 32.8* 31.9* 30.0* 32.0* 36.1  MCV 89.7 90.1 90.9 89.6  --  92.6  PLT 213 167 207 184  --  228   Cardiac Enzymes: No results for input(s): CKTOTAL, CKMB, CKMBINDEX, TROPONINI in the last 168 hours. BNP (last 3 results) No results for input(s): BNP in the last 8760 hours.  ProBNP (last 3 results) No results for input(s): PROBNP in the last 8760 hours.  CBG:  Recent Labs Lab 11/28/2015 1459 11/01/2015 1703 10/30/2015 2111 11/15/15 0815 11/15/15 1240  GLUCAP 247* 209* 261* 310* 235*    Recent Results (from the past 240 hour(s))  Culture, blood (routine x 2)     Status: None (Preliminary result)   Collection Time: 11/12/2015 11:02 AM  Result Value Ref Range Status   Specimen Description BLOOD RIGHT ANTECUBITAL  Final   Special Requests BOTTLES DRAWN AEROBIC AND ANAEROBIC 5CC  Final   Culture NO GROWTH 4 DAYS  Final   Report Status PENDING  Incomplete  Culture, blood (routine x 2)     Status: None (Preliminary result)   Collection Time: 11/15/2015 11:11 AM  Result Value Ref Range Status   Specimen Description BLOOD BLOOD RIGHT FOREARM  Final   Special Requests BOTTLES DRAWN AEROBIC AND ANAEROBIC 5CC  Final   Culture NO GROWTH 4 DAYS  Final   Report Status PENDING  Incomplete  Culture, routine-abscess     Status: None   Collection Time: 10/31/2015  9:04  PM  Result Value Ref Range Status   Specimen Description ABSCESS  Final   Special Requests   Final    LEFT HAND PALMAR ABSCESS OVER MEDIAL METACARPALS PATIENT ON FOLLOWING VANCOMYCIN   Gram Stain   Final    FEW WBC PRESENT,BOTH PMN AND MONONUCLEAR NO SQUAMOUS EPITHELIAL CELLS SEEN FEW GRAM POSITIVE COCCI IN PAIRS FEW GRAM POSITIVE RODS RARE GRAM NEGATIVE RODS Performed at [facility]    Culture   Final    MULTIPLE ORGANISMS PRESENT, NONE PREDOMINANT Note: NO STAPHYLOCOCCUS AUREUS ISOLATED NO GROUP A STREP (S.PYOGENES)  ISOLATED Performed at [facility]    Report Status 11/28/2015 FINAL  Final  Anaerobic culture     Status: None (Preliminary result)   Collection Time: 10/30/2015  9:05 PM  Result Value Ref Range Status   Specimen Description ABSCESS  Final   Special Requests   Final    LEFT HAND PALMAR ABSCESS OVER MEDIAL METACARPALS PATIENT ON FOLLOWING VANCOMYCIN   Gram Stain   Final    FEW WBC PRESENT,BOTH PMN AND MONONUCLEAR NO SQUAMOUS EPITHELIAL CELLS SEEN MODERATE GRAM POSITIVE COCCI IN PAIRS FEW GRAM POSITIVE RODS RARE GRAM NEGATIVE RODS Performed at [facility]    Culture   Final    NO ANAEROBES ISOLATED; CULTURE IN PROGRESS FOR 5 DAYS Performed at [facility]    Report Status PENDING  Incomplete  MRSA PCR Screening     Status: None   Collection Time: 11/12/15  2:15 AM  Result Value Ref Range Status   MRSA by PCR NEGATIVE NEGATIVE Final    Comment:        The GeneXpert MRSA Assay (FDA approved for NASAL specimens only), is one component of a comprehensive MRSA colonization surveillance program. It is not intended to diagnose MRSA infection nor to guide or monitor treatment for MRSA infections.      Studies: No results found.  Scheduled Meds: . acidophilus  1 capsule Oral Daily  . aspirin EC  81 mg Oral QHS  . [START ON 11/18/2015] cefTAZidime (FORTAZ)  IV  2 g Intravenous Q M,W,F-1800  . cefTAZidime (FORTAZ)  IV  2 g  Intravenous Once  . cinacalcet  60 mg Oral Q breakfast  . doxercalciferol  0.5 mcg Oral Q M,W,F-HD  . gabapentin  200 mg Oral BID  . hydrocortisone   Rectal BID  . insulin aspart  0-5 Units Subcutaneous QHS  . insulin aspart  0-9 Units Subcutaneous TID WC  . insulin aspart  4 Units Subcutaneous TID WC  . insulin detemir  50 Units Subcutaneous QHS  . midodrine  20 mg Oral TID WC  . multivitamin  1 tablet Oral QHS  . pantoprazole  40 mg Oral Daily  . sevelamer carbonate  4,000 mg Oral TID WC  . sodium chloride  250 mL Intravenous Once  . sodium chloride  500 mL Intravenous Once   Continuous Infusions: . sodium chloride 10 mL/hr at 11/25/2015 1704  . sodium chloride 10 mL/hr at 11/13/2015 1310    Time spent:35 minutes  Velvet Bathe  [facility] Pager [phone] If 7PM-7AM, please contact night-coverage at [email or web address], password [facility] 11/15/2015, 2:48 PM  LOS: 4 days

## 2015-11-15 NOTE — Progress Notes (Signed)
Patient with AMS this morning, patients husbands states "she acts like this when her blood pressure gets low, she needs fluid" blood pressure is ranging systolic in the [id number]. Dr. Wendee Beavers called and updated. New orders received, will implement and continue to monitor.

## 2015-11-15 NOTE — Anesthesia Postprocedure Evaluation (Signed)
Anesthesia Post Note  Patient: [name]  Procedure(s) Performed: Procedure(s) (LRB): IRRIGATION AND DEBRIDEMENT WOUND (Right) DRESSING CHANGE UNDER ANESTHESIA (Left)  Patient location during evaluation: PACU Anesthesia Type: General Level of consciousness: awake and alert, oriented and patient cooperative Pain management: pain level controlled Vital Signs Assessment: post-procedure vital signs reviewed and stable Respiratory status: spontaneous breathing, nonlabored ventilation and respiratory function stable Cardiovascular status: blood pressure returned to baseline and stable Postop Assessment: no signs of nausea or vomiting Anesthetic complications: no Comments: Pt eval in PACU, late entry note    Last Vitals:  Filed Vitals:   11/15/15 1200 11/15/15 1300  BP: 70/52 72/34  Pulse: 81 79  Temp:    Resp: 23 18    Last Pain:  Filed Vitals:   11/15/15 1347  PainSc: 0-No pain                 Dellar Traber,E. Hammad Finkler

## 2015-11-15 NOTE — Progress Notes (Signed)
Pt. Temperature this am was 103 orally pt. Sleepy but arousable, L. HardukNP was notified with orders made.

## 2015-11-15 NOTE — Progress Notes (Signed)
3-13: I&D to distal R FA with terminal amps of R RF, SF, and L IF & thumb 3-16: I&D to mid-forearm Now on Ceftazidime & Vanc, followed by ID  Today, more somnolent, decreased coherence, still with temp spike overnight R UE dressing intact, no external drainage. Left side looks healthy, no need for dressings unless drainage develops.  Plan to return to OR on Saturday morning for repeat I&D for right side. Discussed surgical plan with patient and family.  Her body is slow to gain control over this deep infection.  If her general health status continues to decline despite RUE debridement and IV antibiotics, it could require R UE amputation at some level in effort to preserve life over limb.  Micheline Rough, MD [facility] [phone]

## 2015-11-15 NOTE — Progress Notes (Signed)
Spoke with Whitney RN at 1330 about pt's continued low BP readings.  Pt has received several small volume boluses as pt is an HD patient with normally soft BPs.  Ptt's medical physicians are aware of pt's VS and decreased LOC.  Will continue to monitor situation in elink.  Elink eMD notified of situation as well during lightning rounds, even though pt has not had formal consult to PCCM services.

## 2015-11-15 NOTE — Care Management Important Message (Signed)
Important Message  Patient Details  Name: [name] MRN: [id number] Date of Birth: 08-05-1966   Medicare Important Message Given:  Yes    [name] P [name] 11/15/2015, 2:51 PM

## 2015-11-15 NOTE — Progress Notes (Signed)
Inpatient Diabetes Program Recommendations  AACE/ADA: New Consensus Statement on Inpatient Glycemic Control (2015)  Target Ranges:  Prepandial:   less than 140 mg/dL      Peak postprandial:   less than 180 mg/dL (1-2 hours)      Critically ill patients:  140 - 180 mg/dL   Review of Glycemic Control:  Results for CHANEY, OLIGER (MRN [id number]) as of 11/15/2015 10:18  Ref. Range 11/28/2015 12:59 10/30/2015 14:59 11/23/2015 17:03 11/13/2015 21:11 11/15/2015 08:15  Glucose-Capillary Latest Ref Range: 65-99 mg/dL 277 (H) 247 (H) 209 (H) 261 (H) 310 (H)   Diabetes history: Diabetes Outpatient Diabetes medications: Levemir 50 units, Novolog 20 units tid with meals Current orders for Inpatient glycemic control:  Levemir 50 units daily, Novolog sensitive tid with meals and HS, Novolog 4 units tid with meals  Inpatient Diabetes Program Recommendations:   Consider increasing Novolog meal coverage to 10 units tid with meals.  Also consider increasing Levemir to 56 units daily.  Thanks, Adah Perl, RN, BC-ADM Inpatient Diabetes Coordinator Pager [phone] (8a-5p)

## 2015-11-15 NOTE — Progress Notes (Signed)
Pt blood pressure continues to be low with AMS persisting, Dr. Wendee Beavers called again with new orders receiving will again implement and continue to follow.

## 2015-11-15 NOTE — Consult Note (Signed)
[facility] Infectious [facility]       Reason for Consult: hand abscess, tenosynovitis    Referring Physician: Dr. 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 acidophilus  1 capsule Oral Daily  . aspirin EC  81 mg Oral QHS  . [START ON 11/18/2015] cefTAZidime (FORTAZ)  IV  2 g Intravenous Q M,W,F-1800  . cefTAZidime (FORTAZ)  IV  2 g Intravenous Once  . cinacalcet  60 mg Oral Q breakfast  . doxercalciferol  0.5 mcg Oral Q M,W,F-HD  . gabapentin  200 mg Oral BID  . hydrocortisone   Rectal BID  . insulin aspart  0-5 Units Subcutaneous QHS  . insulin aspart  0-9 Units Subcutaneous TID WC  . insulin aspart  4 Units Subcutaneous TID WC  . insulin detemir  50 Units Subcutaneous QHS  . midodrine  20 mg Oral TID WC  . multivitamin  1 tablet Oral QHS  . pantoprazole  40 mg Oral Daily  . sevelamer carbonate  4,000 mg Oral TID WC  . sodium chloride  250 mL Intravenous Once  . sodium chloride  500 mL Intravenous Once  . vancomycin  1,000 mg Intravenous Q M,W,F-HD    Recommendations: I will add ceftazidime which can be given with dialysis  I will stop vancomycin  Assessment: She has an extensive infection of hand and culture with multiple organisms on gram stain, though no Staph noted.  Differential for her is broad since she does bite her fingers, multiple organisms possible.    She has a zosyn allergy listed that stems from an ED visit but reaction was dizzyness and sob but in evaluation by the ED physician, there was no throat swelling, no hives or concerning signs and resolved on its own.   Therefore I do not feel the allergy is high risk and ok to use cephalosporin.    Antibiotics: Vancomycin 3 days  HPI: [name] is a 50 y.o. female with HTN, diabetes, PVD and ESRD on intermittent HD who came in with increased swelling and drainage of her hand.  She has been closely followed by Dr. Grandville Silos and prior to admission was managing her right hand and noted exposed bone of left thumb, left index, right ring finger, dry gangrene and required operative management first on 3/13 when she underwent left thumb ampuation, left index finger ampuation, right small finger amputation, right hand I and D mid palmar with flexor tenosynovectomy, tendon excision and debridement of hand and volar wrist wound.  Went back to the OR 3/16 for debridement and noted purulence up to the midforearm and is again going back to the OR tomorrow.  Her cultures have not grown anything yet.  She has been on vancomycin 3 days.    Review of Systems:  Unable to be assessed due to mental status All other systems reviewed and are negative   Past Medical History  Diagnosis Date  . 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 Lung nodule     left lung  . Sinus problem   . Neuropathy ([place])   .  Hyperkalemia   . Allergy     seasonal  . Cataract     surgery  . GERD (gastroesophageal reflux disease)   . Low blood pressure   . Neuromuscular disorder ([place])   . Gait difficulty     "uses motor or standard wheelchair" -unsteady on feet.  . 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 upper arm, 02-08-14 now has AV Goretex graft Lt. thigh  . ESRD (end stage renal disease) ([place])     end stage, on dialysis since 05/2002. dry wt. 113 kg.M-W-F, [place] [address].  Marland Kitchen Complication of anesthesia     BP runs usually low. Had an issue with "twilight sleep" in past.  . 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 Personal history of colonic polyps - adenomas 02/15/2014    02/15/2014 2 small polyps , max 7  mm transverse colon    . Diabetes mellitus     type 2  . Hypotension   . DVT (deep venous thrombosis) ([place]) 05/2009  . Unable to bear weight   . Third degree burn     Hand  March- follwed at [facility]  . Diabetic foot ulcer ([place]) 06/13/2015  . Fungal infection 06/13/2015  . 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 Bite wound of finger 06/13/2015  . Osteomyelitis of finger ([place]) 08/15/2015    Social History  Substance Use Topics  . Smoking status: Never Smoker   . Smokeless tobacco: Never Used  . Alcohol Use: Yes     Comment: occasionally    Family History  Problem Relation Age of Onset  . Hypertension Mother   . Liver disease Mother   . Hypertension Father   . Colon polyps Father   . Heart disease Father   . Diabetes Maternal Grandmother   . Diabetes Maternal Grandfather   . Diabetes Paternal Grandmother   . Diabetes Paternal Grandfather   . Colon cancer Neg Hx     Allergies  Allergen Reactions  . Benadryl [Diphenhydramine Hcl (Sleep)] Other (See Comments)    HYPOTENSION  . Wellbutrin [Bupropion] Other (See Comments)    HALLUCINATIONS "Bites Nails"   . Zosyn [Piperacillin Sod-Tazobactam So] Anxiety and Other (See Comments)    Passed out; no throat swelling  . Morphine Other (See Comments)    UNSPECIFIED Rx During Surgery  . Tylenol [Acetaminophen] Other (See Comments)    HYPOTENSION  . Amitriptyline Other (See Comments)    Sleepy  . Percocet [Oxycodone-Acetaminophen] Nausea And Vomiting    Physical Exam: Constitutional: sleeping Filed Vitals:   11/15/15 0930 11/15/15 1030  BP: 81/35 69/45  Pulse: 29 83  Temp:    Resp: 23 24   EYES: anicteric ENMT: no thrush Cardiovascular: Cor RRR Respiratory: CTA B; normal respiratory effort GI: Bowel sounds are normal, liver is not enlarged, spleen is not enlarged Musculoskeletal: no pedal edema noted, arm wrapped Skin: negatives: no rash Hematologic: no cervical lad  Lab Results  Component Value Date   WBC  12.6* 11/05/2015   HGB 9.9* 10/30/2015   HCT 36.1 11/11/2015   MCV 92.6 11/11/2015   PLT 228 11/22/2015    Lab Results  Component Value Date   CREATININE 4.08* 11/01/2015   BUN 38* 11/09/2015   NA 135 11/24/2015   K 3.9 11/24/2015   CL 94* 11/02/2015   CO2 22 11/15/2015    Lab Results  Component Value Date   ALT 13* 11/12/2015   AST 20 11/12/2015   ALKPHOS 209*  11/12/2015     Microbiology: Recent Results (from the past 240 hour(s))  Culture, blood (routine x 2)     Status: None (Preliminary result)   Collection Time: 11/03/2015 11:02 AM  Result Value Ref Range Status   Specimen Description BLOOD RIGHT ANTECUBITAL  Final   Special Requests BOTTLES DRAWN AEROBIC AND ANAEROBIC 5CC  Final   Culture NO GROWTH 3 DAYS  Final   Report Status PENDING  Incomplete  Culture, blood (routine x 2)     Status: None (Preliminary result)   Collection Time: 11/28/2015 11:11 AM  Result Value Ref Range Status   Specimen Description BLOOD BLOOD RIGHT FOREARM  Final   Special Requests BOTTLES DRAWN AEROBIC AND ANAEROBIC 5CC  Final   Culture NO GROWTH 3 DAYS  Final   Report Status PENDING  Incomplete  Culture, routine-abscess     Status: None   Collection Time: 11/13/2015  9:04 PM  Result Value Ref Range Status   Specimen Description ABSCESS  Final   Special Requests   Final    LEFT HAND PALMAR ABSCESS OVER MEDIAL METACARPALS PATIENT ON FOLLOWING VANCOMYCIN   Gram Stain   Final    FEW WBC PRESENT,BOTH PMN AND MONONUCLEAR NO SQUAMOUS EPITHELIAL CELLS SEEN FEW GRAM POSITIVE COCCI IN PAIRS FEW GRAM POSITIVE RODS RARE GRAM NEGATIVE RODS Performed at [facility]    Culture   Final    MULTIPLE ORGANISMS PRESENT, NONE PREDOMINANT Note: NO STAPHYLOCOCCUS AUREUS ISOLATED NO GROUP A STREP (S.PYOGENES) ISOLATED Performed at [facility]    Report Status 11/13/2015 FINAL  Final  Anaerobic culture     Status: None (Preliminary result)   Collection Time: 11/28/2015  9:05 PM  Result  Value Ref Range Status   Specimen Description ABSCESS  Final   Special Requests   Final    LEFT HAND PALMAR ABSCESS OVER MEDIAL METACARPALS PATIENT ON FOLLOWING VANCOMYCIN   Gram Stain   Final    FEW WBC PRESENT,BOTH PMN AND MONONUCLEAR NO SQUAMOUS EPITHELIAL CELLS SEEN MODERATE GRAM POSITIVE COCCI IN PAIRS FEW GRAM POSITIVE RODS RARE GRAM NEGATIVE RODS Performed at [facility]    Culture   Final    NO ANAEROBES ISOLATED; CULTURE IN PROGRESS FOR 5 DAYS Performed at [facility]    Report Status PENDING  Incomplete  MRSA PCR Screening     Status: None   Collection Time: 11/12/15  2:15 AM  Result Value Ref Range Status   MRSA by PCR NEGATIVE NEGATIVE Final    Comment:        The GeneXpert MRSA Assay (FDA approved for NASAL specimens only), is one component of a comprehensive MRSA colonization surveillance program. It is not intended to diagnose MRSA infection nor to guide or monitor treatment for MRSA infections.     Scharlene Gloss, [facility] for Infectious Disease [place] [email or web address] [phone] pager  [phone] cell 11/15/2015, 1:13 PM

## 2015-11-15 NOTE — Progress Notes (Addendum)
Pharmacy Antibiotic Note  [name] is a 50 y.o. female admitted on 11/22/2015 with wound infection.  Pharmacy has been consulted for vancomycin dosing. Tmax is 103 and WBC is elevated at 12.6. S/p multiple OR trips. Adding ceftazidime. Per renal, no plans for HD today due to fragile hemodynamic state.   Plan: - Continue Vancomycin 1gm post-HD MWF - skip dose today - Ceftaz 2gm IV x 1 then 2gm post-HD MWF - F/u renal plans, C&S, clinical status and pre-HD level when appropriate  Height: 5\' 8"  (172.7 cm) Weight: 227 lb (102.967 kg) IBW/kg (Calculated) : 63.9  Temp (24hrs), Avg:99 F (37.2 C), Min:96.9 F (36.1 C), Max:103 F (39.4 C)   Recent Labs Lab 11/04/2015 1122  11/28/2015 1145 11/19/2015 1314 11/10/2015 1324 11/12/15 0448 11/12/15 1330 11/12/15 1621 11/13/15 0540 11/13/15 0657 11/05/2015 1604  WBC  --   --  19.3*  --   --  17.5*  --   --  16.4* 15.3* 12.6*  CREATININE  --   < > 6.92* 6.90*  --  3.58*  --   --  5.31* 5.19* 4.08*  LATICACIDVEN 5.74*  --   --   --  3.33*  --  2.7* 3.0*  --   --   --   < > = values in this interval not displayed.  Estimated Creatinine Clearance: 21.2 mL/min (by C-G formula based on Cr of 4.08).    Allergies  Allergen Reactions  . Zosyn [Piperacillin Sod-Tazobactam So] Anaphylaxis and Shortness Of Breath    Passed out  . Benadryl [Diphenhydramine Hcl (Sleep)] Other (See Comments)    HYPOTENSION  . Wellbutrin [Bupropion] Other (See Comments)    HALLUCINATIONS "Bites Nails"   . Morphine Other (See Comments)    UNSPECIFIED Rx During Surgery  . Tylenol [Acetaminophen] Other (See Comments)    HYPOTENSION  . Amitriptyline Other (See Comments)    Sleepy  . Percocet [Oxycodone-Acetaminophen] Nausea And Vomiting    Antimicrobials this admission: Vanc 3/13>>  Ceftaz 3/17>> Flucon x 1 3/15  Dose adjustments this admission: n/a  Microbiology results: 3/13 Abscess - multiple, none predom 3/13 Blood - NGTD 3/14 MRSA - NEG  Salome Arnt, PharmD, BCPS Pager # [phone] 11/15/2015 9:39 AM

## 2015-11-15 NOTE — Progress Notes (Signed)
Sepsis secondary to wounds R. Hand, s/p partial digital amputations 1. ESRD - MWF at [facility]. Next HD today but will place on hold due to fragile hemodynamics (MonWedFri, 5 hrs 0 min, 160NRe Optiflux, BFR 400, DFR Manual 800 mL/min, EDW 105 ) 2. 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 S/p hand surgeries for infx 4. Altered mental status ? Sepsis, post op T to 103, lower BP  Subjective: Interval History: Hand Surgery yesterday  And post op fever and altered mental status per RN and husband  Objective: Vital signs in last 24 hours: Temp:  [96.9 F (36.1 C)-103 F (39.4 C)] 103 F (39.4 C) (03/17 0451) Pulse Rate:  [29-89] 29 (03/17 0930) Resp:  [13-28] 23 (03/17 0930) BP: (70-88)/(35-52) 81/35 mmHg (03/17 0930) SpO2:  [93 %-100 %] 93 % (03/17 0930) Weight:  [102.967 kg (227 lb)] 102.967 kg (227 lb) (03/17 0451) Weight change: -1.933 kg (-4 lb 4.2 oz)  Intake/Output from previous day: 03/16 0701 - 03/17 0700 In: 540 [P.O.:240; I.V.:300] Out: 10 [Blood:10] Intake/Output this shift:   02 sat 100% General appearance: sleeping Extremities: edema 1+  Abd protub and obese  Lab Results:  Recent Labs  11/13/15 0657 11/25/2015 1327 11/12/2015 1604  WBC 15.3*  --  12.6*  HGB 8.8* 10.9* 9.9*  HCT 30.0* 32.0* 36.1  PLT 184  --  228   BMET:  Recent Labs  11/13/15 0657 11/16/2015 1327 11/05/2015 1604  NA 131* 132* 135  K 4.1 4.1 3.9  CL 92*  --  94*  CO2 24  --  22  GLUCOSE 270* 278* 217*  BUN 41*  --  38*  CREATININE 5.19*  --  4.08*  CALCIUM 7.4*  --  8.2*   No results for input(s): PTH in the last 72 hours. Iron Studies: No results for input(s): IRON, TIBC, TRANSFERRIN, FERRITIN in the last 72 hours. Studies/Results: No results found.  Scheduled: . acidophilus  1 capsule Oral Daily  . aspirin EC  81 mg Oral QHS  . cinacalcet  60 mg Oral Q breakfast  . doxercalciferol  0.5 mcg Oral Q M,W,F-HD  . gabapentin  200 mg Oral BID  . hydrocortisone   Rectal BID  . insulin  aspart  0-5 Units Subcutaneous QHS  . insulin aspart  0-9 Units Subcutaneous TID WC  . insulin aspart  4 Units Subcutaneous TID WC  . insulin detemir  50 Units Subcutaneous QHS  . midodrine  20 mg Oral TID WC  . multivitamin  1 tablet Oral QHS  . pantoprazole  40 mg Oral Daily  . sevelamer carbonate  4,000 mg Oral TID WC  . sodium chloride  250 mL Intravenous Once  . sodium chloride  500 mL Intravenous Once  . vancomycin  1,000 mg Intravenous Q M,W,F-HD     LOS: 4 days   [name] C 11/15/2015,10:08 AM

## 2015-11-16 ENCOUNTER — Inpatient Hospital Stay (HOSPITAL_COMMUNITY): Payer: Medicare Other | Admitting: Anesthesiology

## 2015-11-16 ENCOUNTER — Encounter (HOSPITAL_COMMUNITY): Admission: EM | Disposition: E | Payer: Self-pay | Source: Home / Self Care | Attending: Family Medicine

## 2015-11-16 HISTORY — PX: INCISION AND DRAINAGE OF WOUND: SHX1803

## 2015-11-16 LAB — ANAEROBIC CULTURE

## 2015-11-16 LAB — POCT I-STAT 4, (NA,K, GLUC, HGB,HCT)
Glucose, Bld: 215 mg/dL — ABNORMAL HIGH (ref 65–99)
HCT: 35 % — ABNORMAL LOW (ref 36.0–46.0)
Hemoglobin: 11.9 g/dL — ABNORMAL LOW (ref 12.0–15.0)
POTASSIUM: 4.8 mmol/L (ref 3.5–5.1)
SODIUM: 131 mmol/L — AB (ref 135–145)

## 2015-11-16 LAB — CULTURE, BLOOD (ROUTINE X 2)
CULTURE: NO GROWTH
Culture: NO GROWTH

## 2015-11-16 LAB — GLUCOSE, CAPILLARY
GLUCOSE-CAPILLARY: 225 mg/dL — AB (ref 65–99)
GLUCOSE-CAPILLARY: 194 mg/dL — AB (ref 65–99)
GLUCOSE-CAPILLARY: 237 mg/dL — AB (ref 65–99)
GLUCOSE-CAPILLARY: 264 mg/dL — AB (ref 65–99)

## 2015-11-16 SURGERY — IRRIGATION AND DEBRIDEMENT WOUND
Anesthesia: General | Laterality: Right

## 2015-11-16 MED ORDER — SODIUM CHLORIDE 0.9 % IR SOLN
Status: DC | PRN
  Administered 2015-11-16: 3000 mL

## 2015-11-16 MED ORDER — PROPOFOL 10 MG/ML IV BOLUS
INTRAVENOUS | Status: AC
  Filled 2015-11-16: qty 20

## 2015-11-16 MED ORDER — LIDOCAINE-EPINEPHRINE 1 %-1:100000 IJ SOLN
INTRAMUSCULAR | Status: AC
  Filled 2015-11-16: qty 1

## 2015-11-16 MED ORDER — FENTANYL CITRATE (PF) 250 MCG/5ML IJ SOLN
INTRAMUSCULAR | Status: AC
  Filled 2015-11-16: qty 5

## 2015-11-16 MED ORDER — PROPOFOL 10 MG/ML IV BOLUS
INTRAVENOUS | Status: DC | PRN
  Administered 2015-11-16 (×2): 20 mg via INTRAVENOUS

## 2015-11-16 MED ORDER — CALCITRIOL 0.25 MCG PO CAPS
0.7500 ug | ORAL_CAPSULE | ORAL | Status: DC
  Administered 2015-11-18: 0.75 ug via ORAL

## 2015-11-16 MED ORDER — FENTANYL CITRATE (PF) 250 MCG/5ML IJ SOLN
INTRAMUSCULAR | Status: DC | PRN
  Administered 2015-11-16: 100 ug via INTRAVENOUS
  Administered 2015-11-16: 50 ug via INTRAVENOUS
  Administered 2015-11-16: 100 ug via INTRAVENOUS

## 2015-11-16 MED ORDER — SODIUM CHLORIDE 0.9 % IV SOLN
INTRAVENOUS | Status: DC | PRN
  Administered 2015-11-16: 09:00:00 via INTRAVENOUS

## 2015-11-16 MED ORDER — ONDANSETRON HCL 4 MG/2ML IJ SOLN
INTRAMUSCULAR | Status: AC
  Filled 2015-11-16: qty 2

## 2015-11-16 MED ORDER — LIDOCAINE HCL (CARDIAC) 20 MG/ML IV SOLN
INTRAVENOUS | Status: AC
  Filled 2015-11-16: qty 5

## 2015-11-16 SURGICAL SUPPLY — 53 items
BNDG CMPR 9X4 STRL LF SNTH (GAUZE/BANDAGES/DRESSINGS)
BNDG COHESIVE 4X5 TAN STRL (GAUZE/BANDAGES/DRESSINGS) ×1 IMPLANT
BNDG ESMARK 4X9 LF (GAUZE/BANDAGES/DRESSINGS) IMPLANT
BNDG GAUZE ELAST 4 BULKY (GAUZE/BANDAGES/DRESSINGS) ×1 IMPLANT
CHLORAPREP W/TINT 26ML (MISCELLANEOUS) ×1 IMPLANT
COVER SURGICAL LIGHT HANDLE (MISCELLANEOUS) ×3 IMPLANT
CUFF TOURNIQUET SINGLE 18IN (TOURNIQUET CUFF) ×3 IMPLANT
CUFF TOURNIQUET SINGLE 24IN (TOURNIQUET CUFF) IMPLANT
DRAPE SURG 17X23 STRL (DRAPES) ×3 IMPLANT
DRSG ADAPTIC 3X8 NADH LF (GAUZE/BANDAGES/DRESSINGS) ×5 IMPLANT
DRSG VAC ATS MED SENSATRAC (GAUZE/BANDAGES/DRESSINGS) ×2 IMPLANT
ELECT REM PT RETURN 9FT ADLT (ELECTROSURGICAL)
ELECTRODE REM PT RTRN 9FT ADLT (ELECTROSURGICAL) IMPLANT
EVACUATOR 1/8 PVC DRAIN (DRAIN) IMPLANT
GAUZE SPONGE 4X4 12PLY STRL (GAUZE/BANDAGES/DRESSINGS) ×3 IMPLANT
GLOVE BIO SURGEON STRL SZ 6.5 (GLOVE) ×2 IMPLANT
GLOVE BIO SURGEON STRL SZ7.5 (GLOVE) ×3 IMPLANT
GLOVE BIO SURGEONS STRL SZ 6.5 (GLOVE) ×1
GLOVE BIOGEL PI IND STRL 7.0 (GLOVE) ×1 IMPLANT
GLOVE BIOGEL PI IND STRL 8 (GLOVE) ×1 IMPLANT
GLOVE BIOGEL PI INDICATOR 7.0 (GLOVE) ×2
GLOVE BIOGEL PI INDICATOR 8 (GLOVE) ×2
GOWN STRL REUS W/ TWL LRG LVL3 (GOWN DISPOSABLE) ×3 IMPLANT
GOWN STRL REUS W/TWL LRG LVL3 (GOWN DISPOSABLE) ×9
HANDPIECE INTERPULSE COAX TIP (DISPOSABLE)
KIT BASIN OR (CUSTOM PROCEDURE TRAY) ×3 IMPLANT
KIT ROOM TURNOVER OR (KITS) ×3 IMPLANT
MANIFOLD NEPTUNE II (INSTRUMENTS) ×3 IMPLANT
NS IRRIG 1000ML POUR BTL (IV SOLUTION) ×3 IMPLANT
PACK ORTHO EXTREMITY (CUSTOM PROCEDURE TRAY) ×3 IMPLANT
PAD ARMBOARD 7.5X6 YLW CONV (MISCELLANEOUS) ×6 IMPLANT
PAD CAST 4YDX4 CTTN HI CHSV (CAST SUPPLIES) IMPLANT
PAD NEG PRESSURE SENSATRAC (MISCELLANEOUS) ×2 IMPLANT
PADDING CAST COTTON 4X4 STRL (CAST SUPPLIES) ×3
SET CYSTO W/LG BORE CLAMP LF (SET/KITS/TRAYS/PACK) IMPLANT
SET HNDPC FAN SPRY TIP SCT (DISPOSABLE) IMPLANT
SET IRRIG Y TYPE TUR BLADDER L (SET/KITS/TRAYS/PACK) IMPLANT
SPONGE GAUZE 4X4 12PLY STER LF (GAUZE/BANDAGES/DRESSINGS) ×2 IMPLANT
SPONGE LAP 18X18 X RAY DECT (DISPOSABLE) IMPLANT
STOCKINETTE IMPERVIOUS 9X36 MD (GAUZE/BANDAGES/DRESSINGS) IMPLANT
SUT ETHILON 4 0 PS 2 18 (SUTURE) IMPLANT
SUT PROLENE 1 CT (SUTURE) IMPLANT
SUT VICRYL RAPIDE 4/0 PS 2 (SUTURE) IMPLANT
SYRINGE 10CC LL (SYRINGE) IMPLANT
TOWEL OR 17X24 6PK STRL BLUE (TOWEL DISPOSABLE) ×3 IMPLANT
TOWEL OR 17X26 10 PK STRL BLUE (TOWEL DISPOSABLE) ×3 IMPLANT
TUBE ANAEROBIC SPECIMEN COL (MISCELLANEOUS) IMPLANT
TUBE CONNECTING 12'X1/4 (SUCTIONS) ×1
TUBE CONNECTING 12X1/4 (SUCTIONS) ×2 IMPLANT
TUBING CYSTO DISP (UROLOGICAL SUPPLIES) ×3 IMPLANT
UNDERPAD 30X30 INCONTINENT (UNDERPADS AND DIAPERS) ×3 IMPLANT
WATER STERILE IRR 1000ML POUR (IV SOLUTION) ×1 IMPLANT
YANKAUER SUCT BULB TIP NO VENT (SUCTIONS) ×3 IMPLANT

## 2015-11-16 NOTE — Anesthesia Preprocedure Evaluation (Addendum)
Anesthesia Evaluation  Patient identified by MRN, date of birth, ID band Patient awake    Reviewed: Allergy & Precautions, NPO status , Patient's Chart, lab work & pertinent test results  Airway Mallampati: II  TM Distance: >3 FB Neck ROM: Full    Dental  (+) Teeth Intact, Dental Advisory Given   Pulmonary    breath sounds clear to auscultation       Cardiovascular  Rhythm:Regular Rate:Normal     Neuro/Psych    GI/Hepatic   Endo/Other  diabetes, Well Controlled, Type 2, Insulin Dependent, Oral Hypoglycemic AgentsMorbid obesity  Renal/GU DialysisRenal disease     Musculoskeletal   Abdominal   Peds  Hematology   Anesthesia Other Findings   Reproductive/Obstetrics                           Anesthesia Physical Anesthesia Plan  ASA: III  Anesthesia Plan: MAC   Post-op Pain Management:    Induction:   Airway Management Planned:   Additional Equipment:   Intra-op Plan:   Post-operative Plan:   Informed Consent: I have reviewed the patients History and Physical, chart, labs and discussed the procedure including the risks, benefits and alternatives for the proposed anesthesia with the patient or authorized representative who has indicated his/her understanding and acceptance.   Dental advisory given  Plan Discussed with: CRNA, Anesthesiologist and Surgeon  Anesthesia Plan Comments:        Anesthesia Quick Evaluation

## 2015-11-16 NOTE — Progress Notes (Signed)
[facility] PROGRESS NOTE  KARLEEN WAXLER [id number] DOB: Jun 18, 1966 DOA: 11/27/2015 PCP: Arnette Norris, MD  Brief narrative 50 year old female with history of chronic hypotension on midodrine, uncontrolled diabetes with peripheral vascular disease, end-stage renal disease on dialysis (M, W, F), history of staph aureus bacteremia with sepsis 2008, osteomyelitis of multiple digits of her hands and feet status post amputation, GERD, who is wheelchair-bound presented to the ED with fever and infection of her right middle finger. She reported having of fever of 101F at home. In the ED she had a temperature of 99.8 Fahrenheit, hypotensive with blood pressure 73/40 mmHg, normal heart rate and normal O2 sat. She was found to have necrosis of multiple fingertips of both her hands. Lactic acid of 5.74. She received 1 L IV normal saline bolus and IV vancomycin. Patient admitted to hospitalist service on stepdown. Seen by hand surgeon and underwent amputation of left thumb, left index finger, right small finger, right ring finger and right hand incision and drainage of mid palmar space with radical flexor tenosynovectomy, flexor tendon excision and open carpal tunnel release. Also had excisional debridement of partial-thickness skin of right dorsal hand and volar wrist wounds.    Assessment/Plan: Sepsis secondary to multiple infected fingers Taken to OR with findings as above. Continue IV antibiotics as recommended by ID Tressie Ellis) -Patient has been febrile and has refused Tylenol stating it makes her hypotensive.  - Patient's mental status is improved and patient is no longer somnolent. I had discussion with Surgeon and recommended patient continue I and D plans. Discussed with patient that if her condition were to worsen and patient become hypotensive with altered mental status, should we need to amputate her hand to save her life; she would be willing to proceed with that procedure because she wants  to be there for her husband.  -Follow WBC.  Hypotension Chronic and is on midodrine  - Still hypotensive but more alert today  Vulvovaginal candidiasis - Diflucan x 1 time per pharmacy  Hemorrhoids: new problem - Continue Anusol  End-stage renal disease on dialysis Renal following and managing. Yesterday had dialysis session canceled due to altered mental status  Uncontrolled mellitus Continue Lantus with premeal aspart coverage. A1c of 10.1.  Peripheral vascular disease Continue aspirin  DVT prophylaxis: subcutaneous heparin  Diet: Renal/diabetic  Code Status: Full code Family Communication: None at bedside Disposition Plan: Continue step down monitoring. PT eval    Consultants:  Nephrology  Hand surgery  Procedures:  Patient of multiple fingers, right hand I&D on 3/13  Antibiotics:   IV vancomycin since 3/13  HPI/Subjective: Patient more alert today. States she has ringing in her ears  Objective: Filed Vitals:   11/20/2015 0400 10/30/2015 0420  BP:  71/45  Pulse:  97  Temp: 97.9 F (36.6 C)   Resp:  18    Intake/Output Summary (Last 24 hours) at 11/25/2015 0803 Last data filed at 11/02/2015 0500  Gross per 24 hour  Intake 382.67 ml  Output      0 ml  Net 382.67 ml   Filed Weights   11/13/2015 0400 11/15/15 0451 11/01/2015 0417  Weight: 103.874 kg (229 lb) 102.967 kg (227 lb) 103.6 kg (228 lb 6.3 oz)    Exam:   General:  Middle aged female, awake and alert  HEENT: Moist mucosa, supple neck  Chest: Clear bilaterally, no wheezes  CVS: Normal S1-S2, no murmurs or gallop  GI: Soft, nondistended, nontender, bowel sounds present  Musculoskeletal: Dressing over right hand  intact, dressing over left thumb and index finger intact  CNS: Alert and oriented   Data Reviewed: Basic Metabolic Panel:  Recent Labs Lab 11/13/2015 1145 10/31/2015 1314 11/12/15 0448 11/13/15 0540 11/13/15 0657 11/08/2015 1327 11/22/2015 1604  NA 135  --  138 131* 131* 132*  135  K 4.7  --  3.9 4.1 4.1 4.1 3.9  CL 90*  --  94* 91* 92*  --  94*  CO2 19*  --  24 24 24   --  22  GLUCOSE 382*  --  190* 282* 270* 278* 217*  BUN 68*  --  23* 39* 41*  --  38*  CREATININE 6.92* 6.90* 3.58* 5.31* 5.19*  --  4.08*  CALCIUM 7.6*  --  7.9* 7.5* 7.4*  --  8.2*  PHOS  --   --   --   --  5.3*  --  3.9   Liver Function Tests:  Recent Labs Lab 11/07/2015 1145 11/12/15 0448 11/13/15 0657 11/25/2015 1604  AST 28 20  --   --   ALT 15 13*  --   --   ALKPHOS 280* 209*  --   --   BILITOT 1.1 1.4*  --   --   PROT 6.9 6.1*  --   --   ALBUMIN 2.7* 2.4* 2.1* 2.1*   No results for input(s): LIPASE, AMYLASE in the last 168 hours. No results for input(s): AMMONIA in the last 168 hours. CBC:  Recent Labs Lab 11/06/2015 1145 11/12/15 0448 11/13/15 0540 11/13/15 0657 11/21/2015 1327 11/06/2015 1604  WBC 19.3* 17.5* 16.4* 15.3*  --  12.6*  NEUTROABS 17.0*  --   --   --   --   --   HGB 11.0* 9.5* 9.0* 8.8* 10.9* 9.9*  HCT 37.6 32.8* 31.9* 30.0* 32.0* 36.1  MCV 89.7 90.1 90.9 89.6  --  92.6  PLT 213 167 207 184  --  228   Cardiac Enzymes: No results for input(s): CKTOTAL, CKMB, CKMBINDEX, TROPONINI in the last 168 hours. BNP (last 3 results) No results for input(s): BNP in the last 8760 hours.  ProBNP (last 3 results) No results for input(s): PROBNP in the last 8760 hours.  CBG:  Recent Labs Lab 11/24/2015 2111 11/15/15 0815 11/15/15 1240 11/15/15 1655 11/15/15 2219  GLUCAP 261* 310* 235* 241* 221*    Recent Results (from the past 240 hour(s))  Culture, blood (routine x 2)     Status: None (Preliminary result)   Collection Time: 11/10/2015 11:02 AM  Result Value Ref Range Status   Specimen Description BLOOD RIGHT ANTECUBITAL  Final   Special Requests BOTTLES DRAWN AEROBIC AND ANAEROBIC 5CC  Final   Culture NO GROWTH 4 DAYS  Final   Report Status PENDING  Incomplete  Culture, blood (routine x 2)     Status: None (Preliminary result)   Collection Time: 11/10/2015 11:11  AM  Result Value Ref Range Status   Specimen Description BLOOD BLOOD RIGHT FOREARM  Final   Special Requests BOTTLES DRAWN AEROBIC AND ANAEROBIC 5CC  Final   Culture NO GROWTH 4 DAYS  Final   Report Status PENDING  Incomplete  Culture, routine-abscess     Status: None   Collection Time: 11/10/2015  9:04 PM  Result Value Ref Range Status   Specimen Description ABSCESS  Final   Special Requests   Final    LEFT HAND PALMAR ABSCESS OVER MEDIAL METACARPALS PATIENT ON FOLLOWING VANCOMYCIN   Gram Stain   Final  FEW WBC PRESENT,BOTH PMN AND MONONUCLEAR NO SQUAMOUS EPITHELIAL CELLS SEEN FEW GRAM POSITIVE COCCI IN PAIRS FEW GRAM POSITIVE RODS RARE GRAM NEGATIVE RODS Performed at [facility]    Culture   Final    MULTIPLE ORGANISMS PRESENT, NONE PREDOMINANT Note: NO STAPHYLOCOCCUS AUREUS ISOLATED NO GROUP A STREP (S.PYOGENES) ISOLATED Performed at [facility]    Report Status 11/29/2015 FINAL  Final  Anaerobic culture     Status: None (Preliminary result)   Collection Time: 11/21/2015  9:05 PM  Result Value Ref Range Status   Specimen Description ABSCESS  Final   Special Requests   Final    LEFT HAND PALMAR ABSCESS OVER MEDIAL METACARPALS PATIENT ON FOLLOWING VANCOMYCIN   Gram Stain   Final    FEW WBC PRESENT,BOTH PMN AND MONONUCLEAR NO SQUAMOUS EPITHELIAL CELLS SEEN MODERATE GRAM POSITIVE COCCI IN PAIRS FEW GRAM POSITIVE RODS RARE GRAM NEGATIVE RODS Performed at [facility]    Culture   Final    NO ANAEROBES ISOLATED; CULTURE IN PROGRESS FOR 5 DAYS Performed at [facility]    Report Status PENDING  Incomplete  MRSA PCR Screening     Status: None   Collection Time: 11/12/15  2:15 AM  Result Value Ref Range Status   MRSA by PCR NEGATIVE NEGATIVE Final    Comment:        The GeneXpert MRSA Assay (FDA approved for NASAL specimens only), is one component of a comprehensive MRSA colonization surveillance program. It is not intended to diagnose  MRSA infection nor to guide or monitor treatment for MRSA infections.      Studies: No results found.  Scheduled Meds: . acidophilus  1 capsule Oral Daily  . aspirin EC  81 mg Oral QHS  . [START ON 11/18/2015] cefTAZidime (FORTAZ)  IV  2 g Intravenous Q M,W,F-1800  . cinacalcet  60 mg Oral Q breakfast  . doxercalciferol  0.5 mcg Oral Q M,W,F-HD  . gabapentin  200 mg Oral BID  . hydrocortisone   Rectal BID  . insulin aspart  0-5 Units Subcutaneous QHS  . insulin aspart  0-9 Units Subcutaneous TID WC  . insulin aspart  4 Units Subcutaneous TID WC  . insulin detemir  50 Units Subcutaneous QHS  . midodrine  20 mg Oral TID WC  . multivitamin  1 tablet Oral QHS  . pantoprazole  40 mg Oral Daily  . sevelamer carbonate  4,000 mg Oral TID WC  . sodium chloride  250 mL Intravenous Once  . sodium chloride  500 mL Intravenous Once   Continuous Infusions: . sodium chloride Stopped (11/21/2015 0500)  . sodium chloride 10 mL/hr at 11/15/2015 1310    Time spent:35 minutes  Velvet Bathe  [facility] Pager [phone] If 7PM-7AM, please contact night-coverage at [email or web address], password [facility] 11/23/2015, 8:03 AM  LOS: 5 days

## 2015-11-16 NOTE — Progress Notes (Signed)
Dr. Al Corpus updated. Released to stepdown

## 2015-11-16 NOTE — Progress Notes (Addendum)
Paged Dr. Wendee Beavers at [phone] regarding pt SBP in the 40s. Received order to give 500 mL bolus now. Also let Dr. Joelyn Oms from nephrology know and he ordered to hold off on the bolus to watch and see if the midodrine brought pressure back up, but if mentation started to become an issue then to give bolus and call a rapid.   Current BP is 67/38 which is pts normal bp.

## 2015-11-16 NOTE — Anesthesia Postprocedure Evaluation (Signed)
Anesthesia Post Note  Patient: [name]  Procedure(s) Performed: Procedure(s) (LRB): REPEAT IRRIGATION AND DEBRIDEMENT WOUND WITH VAC PLACEMENT (Right)  Patient location during evaluation: PACU Anesthesia Type: General Level of consciousness: awake and alert Pain management: pain level controlled Vital Signs Assessment: post-procedure vital signs reviewed and stable Respiratory status: spontaneous breathing, nonlabored ventilation, respiratory function stable and patient connected to nasal cannula oxygen Cardiovascular status: blood pressure returned to baseline and stable Postop Assessment: no signs of nausea or vomiting Anesthetic complications: no    Last Vitals:  Filed Vitals:   11/09/2015 1127 11/12/2015 1140  BP: 80/48 65/36  Pulse:    Temp: 36.4 C   Resp:      Last Pain:  Filed Vitals:   11/04/2015 1221  PainSc: 3                  Nahome Bublitz A

## 2015-11-16 NOTE — Op Note (Signed)
11/25/2015 - 11/11/2015  11:29 AM  PATIENT:  [name]  50 y.o. female  PRE-OPERATIVE DIAGNOSIS:  Right hand/forearm infection with necrosis  POST-OPERATIVE DIAGNOSIS:  Same  PROCEDURE:  Right hand/forearm excisional debridement (S,SQ,Tendon) with excision of palmaris longis and application of wound VAC dressing  SURGEON: Rayvon Char. Grandville Silos, MD  PHYSICIAN ASSISTANT: none  ANESTHESIA:  MAC  SPECIMENS:  None  DRAINS:   None  EBL:  less than 50 mL  PREOPERATIVE INDICATIONS:  [name] is a  49 y.o. female with right hand/forearm deep infection, whose been to the operating room to previous time for fingertip amputation and debridement up to the midforearm.  Last was 10/30/2015.  Returns today for further wound evaluation and management as indicated  The risks benefits and alternatives were discussed with the patient and her husband preoperatively including but not limited to the risks of infection, bleeding, nerve injury, cardiopulmonary complications, the need for revision surgery, among others, and the patient verbalized understanding and consented to proceed.  OPERATIVE IMPLANTS: None  OPERATIVE PROCEDURE: The patient was escorted to the operative theatre and placed in a supine position.  Her peripheral IV was found not to be working, and it considerable period of time was spent trying to establish IV access.  Ultimately, due to her degree of neuropathy, I decided to proceed at least with wound irrigation if possible without anesthesia, and this proved to be acceptable.  The sutures were removed, and the wound was irrigated copiously with running irrigation.  Advanced necrosis of the palmar skin extending from the incision radially to the first web space and even wrapping around to the dorsum of the hand was encountered.  After thorough irrigation, including removing the roof of a dorsal radial hand blister, decision was made to proceed with more significant excisional  debridement if possible.  The limb was prepped with Betadine and draped in usual sterile fashion.  The large area palmar skin that was gray/black and necrotic was sharply debrided area subcutaneous tissue remained.  Bleeding was scant.  Another circular area of skin on the hyperthenar border about the size of a quarter was excisionally debrided of skin as well.  There was still some snotty tenosynovium/pus in the distal and mid forearm necessitating sharp debridement of some more of the deep fascia over the interosseous space.  Midway through the course of the procedure an IV was established and some sedation was rendered.  The decision was made to apply a wound VAC, and I excised the palmaris longus tendon as it was so superficial and exposed in the wound.  Adaptic was placed on the open wounds and wound VAC black sponge placed on the palmar wounds extending up the volar exposure in the forearm.  The entire hand was placed within the occlusive wrap.  Suction was maintainable and placed on 125 continuous.  She was taken to the recovery room.  DISPOSITION: She'll be discharged back to the floor.  We can begin wound care with the [facility] and see how the wounds progress over the next 48 hours before deciding upon whether more proximal amputation is the best course of action versus the long process of surgical debridement and reconstruction.

## 2015-11-16 NOTE — Transfer of Care (Signed)
Immediate Anesthesia Transfer of Care Note  Patient: [name]  Procedure(s) Performed: Procedure(s): REPEAT IRRIGATION AND DEBRIDEMENT WOUND WITH VAC PLACEMENT (Right)  Patient Location: PACU  Anesthesia Type:MAC  Level of Consciousness: awake and alert   Airway & Oxygen Therapy: Patient Spontanous Breathing  Post-op Assessment: Report given to RN and Post -op Vital signs reviewed and stable  Post vital signs: Reviewed and stable  Last Vitals:  Filed Vitals:   11/08/2015 0420 11/10/2015 0725  BP: 71/45 67/38  Pulse: 97 71  Temp:    Resp: 18     Complications: No apparent anesthesia complications

## 2015-11-16 NOTE — Progress Notes (Signed)
OR called regarding patient procedure for I/D or wrist/arm. Updated on VS, including hypotension and no recent labs. OR RN to call this nurse back with ok from anesthesia for procedure.

## 2015-11-16 NOTE — Progress Notes (Signed)
Sepsis secondary to wounds R. Hand, s/p partial digital amputations & debridements 1. ESRD - MWF at [facility]. Next HD today (MonWedFri, 5 hrs 0 min, 160NRe Optiflux, BFR 400, DFR Manual 800 mL/min, EDW 105 ) 2. 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 S/p hand surgeries for infx 4. Altered mental status improved today postop  Subjective: Interval History:   Objective: Vital signs in last 24 hours: Temp:  [97.3 F (36.3 [name])-98.5 F (36.9 [name])] 97.3 F (36.3 [name]) (03/18 1150) Pulse Rate:  [64-97] 71 (03/18 0725) Resp:  [15-22] 15 (03/18 1230) BP: (58-82)/(32-64) 60/37 mmHg (03/18 1230) SpO2:  [95 %-100 %] 95 % (03/18 1150) Weight:  [103.6 kg (228 lb 6.3 oz)] 103.6 kg (228 lb 6.3 oz) (03/18 0417) Weight change: 0.633 kg (1 lb 6.3 oz)  Intake/Output from previous day: 03/17 0701 - 03/18 0700 In: 382.7 [I.V.:382.7] Out: -  Intake/Output this shift: Total I/O In: 200 [I.V.:200] Out: 10 [Blood:10]  General appearance: alert and cooperative Resp: clear to auscultation bilaterally Chest wall: no tenderness Cardio: regular rate and rhythm, S1, S2 normal, no murmur, click, rub or gallop Extremities: post op bandaged on right UE, sutures left digit left groin AVGG with thrill  Lab Results:  Recent Labs  11/28/2015 1604 11/05/2015 0836  WBC 12.6*  --   HGB 9.9* 11.9*  HCT 36.1 35.0*  PLT 228  --    BMET:  Recent Labs  11/02/2015 1604 11/02/2015 0836  NA 135 131*  K 3.9 4.8  CL 94*  --   CO2 22  --   GLUCOSE 217* 215*  BUN 38*  --   CREATININE 4.08*  --   CALCIUM 8.2*  --    No results for input(s): PTH in the last 72 hours. Iron Studies: No results for input(s): IRON, TIBC, TRANSFERRIN, FERRITIN in the last 72 hours. Studies/Results: No results found.  Scheduled: . acidophilus  1 capsule Oral Daily  . aspirin EC  81 mg Oral QHS  . [START ON 11/18/2015] cefTAZidime (FORTAZ)  IV  2 g Intravenous Q M,W,F-1800  . cinacalcet  60 mg Oral Q breakfast  . doxercalciferol  0.5 mcg  Oral Q M,W,F-HD  . gabapentin  200 mg Oral BID  . hydrocortisone   Rectal BID  . insulin aspart  0-5 Units Subcutaneous QHS  . insulin aspart  0-9 Units Subcutaneous TID WC  . insulin aspart  4 Units Subcutaneous TID WC  . insulin detemir  50 Units Subcutaneous QHS  . midodrine  20 mg Oral TID WC  . multivitamin  1 tablet Oral QHS  . pantoprazole  40 mg Oral Daily  . sevelamer carbonate  4,000 mg Oral TID WC  . sodium chloride  250 mL Intravenous Once  . sodium chloride  500 mL Intravenous Once    LOS: 5 days   [name] 10/30/2015,12:44 PM

## 2015-11-17 DIAGNOSIS — S61409A Unspecified open wound of unspecified hand, initial encounter: Secondary | ICD-10-CM

## 2015-11-17 DIAGNOSIS — M86141 Other acute osteomyelitis, right hand: Secondary | ICD-10-CM

## 2015-11-17 DIAGNOSIS — I96 Gangrene, not elsewhere classified: Secondary | ICD-10-CM | POA: Insufficient documentation

## 2015-11-17 LAB — RENAL FUNCTION PANEL
ALBUMIN: 1.8 g/dL — AB (ref 3.5–5.0)
Anion gap: 21 — ABNORMAL HIGH (ref 5–15)
BUN: 83 mg/dL — AB (ref 6–20)
CHLORIDE: 94 mmol/L — AB (ref 101–111)
CO2: 16 mmol/L — ABNORMAL LOW (ref 22–32)
Calcium: 8.3 mg/dL — ABNORMAL LOW (ref 8.9–10.3)
Creatinine, Ser: 7.17 mg/dL — ABNORMAL HIGH (ref 0.44–1.00)
GFR calc Af Amer: 7 mL/min — ABNORMAL LOW (ref >60)
GFR calc non Af Amer: 6 mL/min — ABNORMAL LOW (ref >60)
GLUCOSE: 325 mg/dL — AB (ref 65–99)
POTASSIUM: 5.2 mmol/L — AB (ref 3.5–5.1)
Phosphorus: 5.7 mg/dL — ABNORMAL HIGH (ref 2.5–4.6)
Sodium: 131 mmol/L — ABNORMAL LOW (ref 135–145)

## 2015-11-17 LAB — GLUCOSE, CAPILLARY
GLUCOSE-CAPILLARY: 202 mg/dL — AB (ref 65–99)
Glucose-Capillary: 193 mg/dL — ABNORMAL HIGH (ref 65–99)
Glucose-Capillary: 215 mg/dL — ABNORMAL HIGH (ref 65–99)

## 2015-11-17 MED ORDER — SODIUM CHLORIDE 0.9 % IV SOLN: 100.0000 mL | INTRAVENOUS | Status: DC | PRN

## 2015-11-17 MED ORDER — DEXTROSE 5 % IV SOLN
2.0000 g | Freq: Once | INTRAVENOUS | Status: AC
  Administered 2015-11-17: 2 g via INTRAVENOUS
  Filled 2015-11-17: qty 2

## 2015-11-17 MED ORDER — ALTEPLASE 2 MG IJ SOLR: 2.0000 mg | Freq: Once | INTRAMUSCULAR | Status: DC | PRN

## 2015-11-17 MED ORDER — BISACODYL 10 MG RE SUPP
10.0000 mg | Freq: Once | RECTAL | Status: AC
  Administered 2015-11-17: 10 mg via RECTAL
  Filled 2015-11-17: qty 1

## 2015-11-17 MED ORDER — PENTAFLUOROPROP-TETRAFLUOROETH EX AERO: 1.0000 "application " | INHALATION_SPRAY | CUTANEOUS | Status: DC | PRN

## 2015-11-17 MED ORDER — LIDOCAINE HCL (PF) 1 % IJ SOLN: 5.0000 mL | INTRAMUSCULAR | Status: DC | PRN

## 2015-11-17 MED ORDER — LIDOCAINE-PRILOCAINE 2.5-2.5 % EX CREA
1.0000 "application " | TOPICAL_CREAM | CUTANEOUS | Status: DC | PRN
  Filled 2015-11-17: qty 5

## 2015-11-17 MED ORDER — LACTULOSE 10 GM/15ML PO SOLN
30.0000 g | Freq: Once | ORAL | Status: AC
  Administered 2015-11-17: 30 g via ORAL
  Filled 2015-11-17: qty 45

## 2015-11-17 MED ORDER — HEPARIN SODIUM (PORCINE) 1000 UNIT/ML DIALYSIS
1000.0000 [IU] | INTRAMUSCULAR | Status: DC | PRN
  Filled 2015-11-17: qty 1

## 2015-11-17 NOTE — Progress Notes (Signed)
Hemodialysis-Pt received approx 2.75 hours of 3 hour HD treatment prior to dialyzer clotting. (No heparin treatment, flushing q hour). All blood was able to be given back. Pt remains intermittently confused, post bp 60s/40s. Pt is currently sleeping.

## 2015-11-17 NOTE — Progress Notes (Signed)
[facility] Progress Note Patient Name: [name] DOB: Mar 04, 1966 MRN: [id number]   Date of Service  11/17/2015  HPI/Events of Note  HR 114 alert in e-monitor  Called RN and ESRD patient and per reievew and paer RN sbp baseline is 60-70 baseline.  She is end stage venous access but full code and resistant to palliation (husband and patient). She has multiple amputations.   Currently HR 114 and elevated SBP 70s and per review - baseline  eICU Interventions  Check lactic acid to differntiate if she is worswning or not     Intervention Category Major Interventions: Hypotension - evaluation and management  [name] 11/17/2015, 12:14 AM

## 2015-11-17 NOTE — Progress Notes (Signed)
Hemodialysis- Pt brought to dialysis. Intermittently confused but can tell me about her treatment as well as chronic hypotension. Dr. Joelyn Oms notified about bp prior to arrival to unit. Order to keep over 70 systolic and monitor mentation closely. Treatment initiated without issue. Pt is currently sleeping. UF is off for now as bp is <70. Continue to monitor.

## 2015-11-17 NOTE — Progress Notes (Signed)
Sepsis secondary to wounds R. Hand, s/p partial digital amputations & debridements 1. ESRD - MWF at [facility] Next HD Monday (MonWedFri, 5 hrs 0 min, 160NRe Optiflux, BFR 400, DFR Manual 800 mL/min, EDW 105 ) 2. 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 S/p hand surgeries for infx 4. Altered mental status improved      Subjective: Interval History: diff hearing she thinks from low BP  Objective: Vital signs in last 24 hours: Temp:  [96.2 F (35.7 C)-98 F (36.7 C)] 97.4 F (36.3 C) (03/19 1100) Pulse Rate:  [59-128] 128 (03/19 1500) Resp:  [12-21] 15 (03/19 1500) BP: (61-94)/(25-76) 66/25 mmHg (03/19 1500) SpO2:  [86 %-100 %] 99 % (03/19 1500) Weight:  [112.7 kg (248 lb 7.3 oz)-112.946 kg (249 lb)] 112.7 kg (248 lb 7.3 oz) (03/19 0442) Weight change: 9.346 kg (20 lb 9.7 oz)  Intake/Output from previous day: 03/18 0701 - 03/19 0700 In: 370 [P.O.:120; I.V.:200; IV Piggyback:50] Out: 73 [Blood:10] Intake/Output this shift:   Awake and alert, HOH GI: soft, non-tender; bowel sounds normal; no masses,  no organomegaly  Lab Results:  Recent Labs  11/03/2015 0836  HGB 11.9*  HCT 35.0*   BMET:  Recent Labs  11/03/2015 0836 11/17/15 0206  NA 131* 131*  K 4.8 5.2*  CL  --  94*  CO2  --  16*  GLUCOSE 215* 325*  BUN  --  83*  CREATININE  --  7.17*  CALCIUM  --  8.3*   No results for input(s): PTH in the last 72 hours. Iron Studies: No results for input(s): IRON, TIBC, TRANSFERRIN, FERRITIN in the last 72 hours. Studies/Results: No results found.  Scheduled: . acidophilus  1 capsule Oral Daily  . aspirin EC  81 mg Oral QHS  . [START ON 11/18/2015] calcitRIOL  0.75 mcg Oral Q M,W,F-HD  . [START ON 11/18/2015] cefTAZidime (FORTAZ)  IV  2 g Intravenous Q M,W,F-1800  . cinacalcet  60 mg Oral Q breakfast  . gabapentin  200 mg Oral BID  . hydrocortisone   Rectal BID  . insulin aspart  0-5 Units Subcutaneous QHS  . insulin aspart  0-9 Units Subcutaneous TID WC  . insulin  aspart  4 Units Subcutaneous TID WC  . insulin detemir  50 Units Subcutaneous QHS  . midodrine  20 mg Oral TID WC  . multivitamin  1 tablet Oral QHS  . pantoprazole  40 mg Oral Daily  . sevelamer carbonate  4,000 mg Oral TID WC     LOS: 6 days   Carleena Mires C 11/17/2015,5:02 PM

## 2015-11-17 NOTE — Progress Notes (Signed)
[facility] PROGRESS NOTE  [name] [id number] DOB: 07-07-1966 DOA: 11/27/2015 PCP: Arnette Norris, MD  Brief narrative 50 year old female with history of chronic hypotension on midodrine, uncontrolled diabetes with peripheral vascular disease, end-stage renal disease on dialysis (M, W, F), history of staph aureus bacteremia with sepsis 2008, osteomyelitis of multiple digits of her hands and feet status post amputation, GERD, who is wheelchair-bound presented to the ED with fever and infection of her right middle finger. She reported having of fever of 101F at home. In the ED she had a temperature of 99.8 Fahrenheit, hypotensive with blood pressure 73/40 mmHg, normal heart rate and normal O2 sat. She was found to have necrosis of multiple fingertips of both her hands. Lactic acid of 5.74. She received 1 L IV normal saline bolus and IV vancomycin. Patient admitted to hospitalist service on stepdown. Seen by hand surgeon and underwent amputation of left thumb, left index finger, right small finger, right ring finger and right hand incision and drainage of mid palmar space with radical flexor tenosynovectomy, flexor tendon excision and open carpal tunnel release. Also had excisional debridement of partial-thickness skin of right dorsal hand and volar wrist wounds.    Assessment/Plan: Sepsis secondary to multiple infected fingers Taken to OR with findings as above. Continue IV antibiotics as recommended by ID Tressie Ellis) - Patient has been febrile and has refused Tylenol stating it makes her hypotensive.  - Pt undergoing serial debridements but may require RUE amputation should condition worsen - Follow WBC.  Hypotension Chronic and is on midodrine  - Still hypotensive but more alert today  Vulvovaginal candidiasis - Diflucan x 1 time per pharmacy  Hemorrhoids: new problem - Continue Anusol  End-stage renal disease on dialysis Renal following and managing.   Uncontrolled  mellitus Continue Lantus with SSI  Peripheral vascular disease Continue aspirin  DVT prophylaxis: subcutaneous heparin  Diet: Renal/diabetic  Code Status: Full code Family Communication: None at bedside Disposition Plan: Continue step down monitoring secondary to hypotension in context of active infection.   Consultants:  Nephrology  Hand surgery  Procedures:  Patient of multiple fingers, right hand I&D on 3/13  Antibiotics:   IV vancomycin since 3/13  HPI/Subjective: Patient more alert today. States she has ringing in her ears  Objective: Filed Vitals:   11/17/15 0442 11/17/15 0610  BP: 63/29 67/29  Pulse: 59 61  Temp:    Resp: 15 18    Intake/Output Summary (Last 24 hours) at 11/17/15 1358 Last data filed at 11/17/15 0606  Gross per 24 hour  Intake    170 ml  Output     63 ml  Net    107 ml   Filed Weights   10/31/2015 0417 11/17/15 0202 11/17/15 0442  Weight: 103.6 kg (228 lb 6.3 oz) 112.946 kg (249 lb) 112.7 kg (248 lb 7.3 oz)    Exam:   General:  Middle aged female, awake and alert  HEENT: Moist mucosa, supple neck  Chest: Clear bilaterally, no wheezes  CVS: Normal S1-S2, no murmurs or gallop  GI: Soft, nondistended, nontender, bowel sounds present  Musculoskeletal: Dressing over right hand intact, dressing over left thumb and index finger intact  CNS: Alert and oriented   Data Reviewed: Basic Metabolic Panel:  Recent Labs Lab 11/12/15 0448 11/13/15 0540 11/13/15 0657 11/15/2015 1327 11/03/2015 1604 11/28/2015 0836 11/17/15 0206  NA 138 131* 131* 132* 135 131* 131*  K 3.9 4.1 4.1 4.1 3.9 4.8 5.2*  CL 94* 91* 92*  --  94*  --  94*  CO2 24 24 24   --  22  --  16*  GLUCOSE 190* 282* 270* 278* 217* 215* 325*  BUN 23* 39* 41*  --  38*  --  83*  CREATININE 3.58* 5.31* 5.19*  --  4.08*  --  7.17*  CALCIUM 7.9* 7.5* 7.4*  --  8.2*  --  8.3*  PHOS  --   --  5.3*  --  3.9  --  5.7*   Liver Function Tests:  Recent Labs Lab  11/10/2015 1145 11/12/15 0448 11/13/15 0657 11/29/2015 1604 11/17/15 0206  AST 28 20  --   --   --   ALT 15 13*  --   --   --   ALKPHOS 280* 209*  --   --   --   BILITOT 1.1 1.4*  --   --   --   PROT 6.9 6.1*  --   --   --   ALBUMIN 2.7* 2.4* 2.1* 2.1* 1.8*   No results for input(s): LIPASE, AMYLASE in the last 168 hours. No results for input(s): AMMONIA in the last 168 hours. CBC:  Recent Labs Lab 11/24/2015 1145 11/12/15 0448 11/13/15 0540 11/13/15 0657 11/25/2015 1327 11/12/2015 1604 11/25/2015 0836  WBC 19.3* 17.5* 16.4* 15.3*  --  12.6*  --   NEUTROABS 17.0*  --   --   --   --   --   --   HGB 11.0* 9.5* 9.0* 8.8* 10.9* 9.9* 11.9*  HCT 37.6 32.8* 31.9* 30.0* 32.0* 36.1 35.0*  MCV 89.7 90.1 90.9 89.6  --  92.6  --   PLT 213 167 207 184  --  228  --    Cardiac Enzymes: No results for input(s): CKTOTAL, CKMB, CKMBINDEX, TROPONINI in the last 168 hours. BNP (last 3 results) No results for input(s): BNP in the last 8760 hours.  ProBNP (last 3 results) No results for input(s): PROBNP in the last 8760 hours.  CBG:  Recent Labs Lab 11/23/2015 1212 11/17/2015 1719 10/30/2015 2131 11/17/15 0852 11/17/15 1113  GLUCAP 194* 237* 264* 193* 215*    Recent Results (from the past 240 hour(s))  Culture, blood (routine x 2)     Status: None   Collection Time: 11/25/2015 11:02 AM  Result Value Ref Range Status   Specimen Description BLOOD RIGHT ANTECUBITAL  Final   Special Requests BOTTLES DRAWN AEROBIC AND ANAEROBIC 5CC  Final   Culture NO GROWTH 5 DAYS  Final   Report Status 11/05/2015 FINAL  Final  Culture, blood (routine x 2)     Status: None   Collection Time: 11/15/2015 11:11 AM  Result Value Ref Range Status   Specimen Description BLOOD BLOOD RIGHT FOREARM  Final   Special Requests BOTTLES DRAWN AEROBIC AND ANAEROBIC 5CC  Final   Culture NO GROWTH 5 DAYS  Final   Report Status 11/15/2015 FINAL  Final  Culture, routine-abscess     Status: None   Collection Time: 11/09/2015  9:04 PM   Result Value Ref Range Status   Specimen Description ABSCESS  Final   Special Requests   Final    LEFT HAND PALMAR ABSCESS OVER MEDIAL METACARPALS PATIENT ON FOLLOWING VANCOMYCIN   Gram Stain   Final    FEW WBC PRESENT,BOTH PMN AND MONONUCLEAR NO SQUAMOUS EPITHELIAL CELLS SEEN FEW GRAM POSITIVE COCCI IN PAIRS FEW GRAM POSITIVE RODS RARE GRAM NEGATIVE RODS Performed at [facility]    Culture   Final  MULTIPLE ORGANISMS PRESENT, NONE PREDOMINANT Note: NO STAPHYLOCOCCUS AUREUS ISOLATED NO GROUP A STREP (S.PYOGENES) ISOLATED Performed at [facility]    Report Status 10/31/2015 FINAL  Final  Anaerobic culture     Status: None   Collection Time: 11/27/2015  9:05 PM  Result Value Ref Range Status   Specimen Description ABSCESS  Final   Special Requests   Final    LEFT HAND PALMAR ABSCESS OVER MEDIAL METACARPALS PATIENT ON FOLLOWING VANCOMYCIN   Gram Stain   Final    FEW WBC PRESENT,BOTH PMN AND MONONUCLEAR NO SQUAMOUS EPITHELIAL CELLS SEEN MODERATE GRAM POSITIVE COCCI IN PAIRS FEW GRAM POSITIVE RODS RARE GRAM NEGATIVE RODS Performed at [facility]    Culture   Final    NO ANAEROBES ISOLATED Performed at [facility]    Report Status 11/23/2015 FINAL  Final  MRSA PCR Screening     Status: None   Collection Time: 11/12/15  2:15 AM  Result Value Ref Range Status   MRSA by PCR NEGATIVE NEGATIVE Final    Comment:        The GeneXpert MRSA Assay (FDA approved for NASAL specimens only), is one component of a comprehensive MRSA colonization surveillance program. It is not intended to diagnose MRSA infection nor to guide or monitor treatment for MRSA infections.      Studies: No results found.  Scheduled Meds: . acidophilus  1 capsule Oral Daily  . aspirin EC  81 mg Oral QHS  . [START ON 11/18/2015] calcitRIOL  0.75 mcg Oral Q M,W,F-HD  . [START ON 11/18/2015] cefTAZidime (FORTAZ)  IV  2 g Intravenous Q M,W,F-1800  . cinacalcet  60 mg  Oral Q breakfast  . gabapentin  200 mg Oral BID  . hydrocortisone   Rectal BID  . insulin aspart  0-5 Units Subcutaneous QHS  . insulin aspart  0-9 Units Subcutaneous TID WC  . insulin aspart  4 Units Subcutaneous TID WC  . insulin detemir  50 Units Subcutaneous QHS  . lactulose  30 g Oral Once  . midodrine  20 mg Oral TID WC  . multivitamin  1 tablet Oral QHS  . pantoprazole  40 mg Oral Daily  . sevelamer carbonate  4,000 mg Oral TID WC   Continuous Infusions: . sodium chloride 10 mL/hr at 11/13/2015 1310    Time spent:35 minutes  Velvet Bathe  [facility] Pager [phone] If 7PM-7AM, please contact night-coverage at [email or web address], password [facility] 11/17/2015, 1:58 PM  LOS: 6 days

## 2015-11-17 NOTE — Progress Notes (Signed)
[facility] Progress Note Patient Name: [name] DOB: 11-03-1965 MRN: [id number]   Date of Service  11/17/2015  HPI/Events of Note  New admit    ICD-9-CM ICD-10-CM   1. Sepsis, due to unspecified organism ([place]) 038.9 A41.9    995.91    2. Infected finger 686.9 L08.9   3. Cellulitis of left hand 682.4 L03.114   4. ESRD (end stage renal disease) ([facility]) 585.6 N18.6   5. Hyperglycemia 790.29 R73.9   6. Hypoxia 799.02 R09.02 DG CHEST PORT 1 VIEW     DG CHEST PORT 1 VIEW     Recent Labs Lab 11/13/15 0540 11/13/15 0657 11/12/2015 1327 11/12/2015 1604 11/21/2015 0836  HGB 9.0* 8.8* 10.9* 9.9* 11.9*  HCT 31.9* 30.0* 32.0* 36.1 35.0*  WBC 16.4* 15.3*  --  12.6*  --   PLT 207 184  --  228  --     Recent Labs Lab 11/05/2015 1145 11/15/2015 1314 11/12/15 0448 11/13/15 0540 11/13/15 0657 11/24/2015 1327 11/09/2015 1604 11/02/2015 0836  NA 135  --  138 131* 131* 132* 135 131*  K 4.7  --  3.9 4.1 4.1 4.1 3.9 4.8  CL 90*  --  94* 91* 92*  --  94*  --   CO2 19*  --  24 24 24   --  22  --   GLUCOSE 382*  --  190* 282* 270* 278* 217* 215*  BUN 68*  --  23* 39* 41*  --  38*  --   CREATININE 6.92* 6.90* 3.58* 5.31* 5.19*  --  4.08*  --   CALCIUM 7.6*  --  7.9* 7.5* 7.4*  --  8.2*  --   PHOS  --   --   --   --  5.3*  --  3.9  --    Looks stable on camera   eICU Interventions  Nil acute from emd at this point  12:20 AM  11/17/2015      Intervention Category Evaluation Type: New Patient Evaluation  Caly Pellum 11/17/2015, 12:19 AM

## 2015-11-17 NOTE — Progress Notes (Signed)
Hemodialysis- Pt continues to be hypotensive (but apparently within baseline). Alert and oriented x2. Knows she is "in the hospital somewhere" and knows her name and age. Calling out occasionally while sleeping. Reorienting frequently when she wakes up. Continue to monitor.

## 2015-11-17 NOTE — Progress Notes (Signed)
Pt returned from HD and wound vac to right wrist/arm area alarming "leak". RN reinforced leaking area with multiple tegaderms to no avail. Medium wound vac dressing ordered and will redress when supplies available.

## 2015-11-17 NOTE — Progress Notes (Signed)
[place] for Infectious Disease    Date of Admission:  11/06/2015   Total days of antibiotics 7        Day 3 ceftaz           ID: [name] is a 50 y.o. female with HTN, diabetes, PVD and ESRD on intermittent HD with  right hand exposed bone of left thumb, left index, right ring finger, dry gangrene and required operative management first on 3/13 when she underwent left thumb ampuation, left index finger ampuation, right small finger amputation, right hand I and D mid palmar with flexor tenosynovectomy, tendon excision and debridement of hand and volar wrist wound. Went back to the OR 3/16 for debridement and noted purulence up to the midforearm and is again going back to the OR on 3/18 for repeat I x D with wound vac tomorrow. 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 acidophilus  1 capsule Oral Daily  . aspirin EC  81 mg Oral QHS  . [START ON 11/18/2015] calcitRIOL  0.75 mcg Oral Q M,W,F-HD  . [START ON 11/18/2015] cefTAZidime (FORTAZ)  IV  2 g Intravenous Q M,W,F-1800  . cinacalcet  60 mg Oral Q breakfast  . gabapentin  200 mg Oral BID  . hydrocortisone   Rectal BID  . insulin aspart  0-5 Units Subcutaneous QHS  . insulin aspart  0-9 Units Subcutaneous TID WC  . insulin aspart  4 Units Subcutaneous TID WC  . insulin detemir  50 Units Subcutaneous QHS  . midodrine  20 mg Oral TID WC  . multivitamin  1 tablet Oral QHS  . pantoprazole  40 mg Oral Daily  . sevelamer carbonate  4,000 mg Oral TID WC    Objective: Vital signs  in last 24 hours: Temp:  [97 F (36.1 C)-98 F (36.7 C)] 98 F (36.7 C) (03/19 0202) Pulse Rate:  [59-66] 61 (03/19 0610) Resp:  [13-27] 18 (03/19 0610) BP: (45-96)/(26-85) 67/29 mmHg (03/19 0610) SpO2:  [84 %-100 %] 99 % (03/19 0610) Weight:  [248 lb 7.3 oz (112.7 kg)-249 lb (112.946 kg)] 248 lb 7.3 oz (112.7 kg) (03/19 0442)  gen = asleep, easily arousable HEENT = dry oral pharynx Skin = right hand has wound vac in place wrapped. Left hand - 1st and 2nd digit has sutures in place from recent debridement/amputation. No drainage  Lab Results  Recent Labs  11/01/2015 1604 11/29/2015 0836 11/17/15 0206  WBC 12.6*  --   --   HGB 9.9* 11.9*  --   HCT 36.1 35.0*  --   NA 135 131* 131*  K 3.9 4.8 5.2*  CL 94*  --  94*  CO2 22  --  16*  BUN 38*  --  83*  CREATININE 4.08*  --  7.17*   Liver Panel  Recent Labs  11/06/2015 1604 11/17/15 0206  ALBUMIN 2.1* 1.8*    Microbiology: Polymicrobial on gram stain but no growth on cx Studies/Results: No results found.   Assessment/Plan: Polymicrobial deep wound infection/osteo of  right hand = continue on ceftaz to give with HD. Plan for 4 wk. Using day 1 of 28 as 11/17/2015. End date will be April 15th. Will sign off. And see her back in the ID clinic to determine if need oral suppression. Wound care recs per Dr. Elvera Lennox, [facility] Cell: [phone] Pager: [phone]  11/17/2015, 12:02 PM

## 2015-11-18 ENCOUNTER — Encounter (HOSPITAL_COMMUNITY): Payer: Self-pay | Admitting: Orthopedic Surgery

## 2015-11-18 ENCOUNTER — Other Ambulatory Visit: Payer: Self-pay | Admitting: Orthopedic Surgery

## 2015-11-18 LAB — RENAL FUNCTION PANEL
ALBUMIN: 1.7 g/dL — AB (ref 3.5–5.0)
Anion gap: 21 — ABNORMAL HIGH (ref 5–15)
BUN: 55 mg/dL — AB (ref 6–20)
CALCIUM: 8.2 mg/dL — AB (ref 8.9–10.3)
CO2: 21 mmol/L — ABNORMAL LOW (ref 22–32)
CREATININE: 5.67 mg/dL — AB (ref 0.44–1.00)
Chloride: 96 mmol/L — ABNORMAL LOW (ref 101–111)
GFR, EST AFRICAN AMERICAN: 9 mL/min — AB (ref >60)
GFR, EST NON AFRICAN AMERICAN: 8 mL/min — AB (ref >60)
Glucose, Bld: 223 mg/dL — ABNORMAL HIGH (ref 65–99)
Phosphorus: 3.4 mg/dL (ref 2.5–4.6)
Potassium: 4.6 mmol/L (ref 3.5–5.1)
SODIUM: 138 mmol/L (ref 135–145)

## 2015-11-18 LAB — GLUCOSE, CAPILLARY
GLUCOSE-CAPILLARY: 98 mg/dL (ref 65–99)
Glucose-Capillary: 99 mg/dL (ref 65–99)

## 2015-11-18 LAB — HEPATITIS B SURFACE ANTIGEN: HEP B S AG: NEGATIVE

## 2015-11-18 LAB — GLUCOSE, RANDOM: GLUCOSE: 138 mg/dL — AB (ref 65–99)

## 2015-11-18 MED ORDER — LIDOCAINE HCL (PF) 1 % IJ SOLN: 5.0000 mL | INTRAMUSCULAR | Status: DC | PRN

## 2015-11-18 MED ORDER — MIDODRINE HCL 5 MG PO TABS
ORAL_TABLET | ORAL | Status: AC
  Filled 2015-11-18: qty 4

## 2015-11-18 MED ORDER — SODIUM CHLORIDE 0.9 % IV SOLN: 100.0000 mL | INTRAVENOUS | Status: DC | PRN

## 2015-11-18 MED ORDER — ALTEPLASE 2 MG IJ SOLR: 2.0000 mg | Freq: Once | INTRAMUSCULAR | Status: DC | PRN

## 2015-11-18 MED ORDER — CALCITRIOL 0.25 MCG PO CAPS
ORAL_CAPSULE | ORAL | Status: AC
Start: 2015-11-18 — End: 2015-11-18
  Filled 2015-11-18: qty 1

## 2015-11-18 MED ORDER — LIDOCAINE-PRILOCAINE 2.5-2.5 % EX CREA: 1.0000 "application " | TOPICAL_CREAM | CUTANEOUS | Status: DC | PRN

## 2015-11-18 MED ORDER — COSYNTROPIN 0.25 MG IJ SOLR
0.2500 mg | Freq: Once | INTRAMUSCULAR | Status: AC
  Administered 2015-11-19: 0.25 mg via INTRAVENOUS
  Filled 2015-11-18 (×2): qty 0.25

## 2015-11-18 MED ORDER — PENTAFLUOROPROP-TETRAFLUOROETH EX AERO: 1.0000 "application " | INHALATION_SPRAY | CUTANEOUS | Status: DC | PRN

## 2015-11-18 MED ORDER — ONDANSETRON HCL 4 MG/2ML IJ SOLN
INTRAMUSCULAR | Status: AC
  Filled 2015-11-18: qty 2

## 2015-11-18 MED ORDER — HEPARIN SODIUM (PORCINE) 1000 UNIT/ML DIALYSIS: 1000.0000 [IU] | INTRAMUSCULAR | Status: DC | PRN

## 2015-11-18 MED ORDER — CALCITRIOL 0.5 MCG PO CAPS
ORAL_CAPSULE | ORAL | Status: AC
  Filled 2015-11-18: qty 1

## 2015-11-18 NOTE — Progress Notes (Signed)
[facility] PROGRESS NOTE  [name] [id number] DOB: 19-Apr-1966 DOA: 11/05/2015 PCP: Arnette Norris, MD  Brief narrative 50 year old female with history of chronic hypotension on midodrine, uncontrolled diabetes with peripheral vascular disease, end-stage renal disease on dialysis (M, W, F), history of staph aureus bacteremia with sepsis 2008, osteomyelitis of multiple digits of her hands and feet status post amputation, GERD, who is wheelchair-bound presented to the ED with fever and infection of her right middle finger. She reported having of fever of 101F at home. In the ED she had a temperature of 99.8 Fahrenheit, hypotensive with blood pressure 73/40 mmHg, normal heart rate and normal O2 sat. She was found to have necrosis of multiple fingertips of both her hands. Lactic acid of 5.74. She received 1 L IV normal saline bolus and IV vancomycin. Patient admitted to hospitalist service on stepdown. Seen by hand surgeon and underwent amputation of left thumb, left index finger, right small finger, right ring finger and right hand incision and drainage of mid palmar space with radical flexor tenosynovectomy, flexor tendon excision and open carpal tunnel release. Also had excisional debridement of partial-thickness skin of right dorsal hand and volar wrist wounds.  Assessment/Plan: Sepsis secondary to multiple infected fingers Taken to OR with findings as above. Continue IV antibiotics as recommended by ID Tressie Ellis) - Pt undergoing serial debridements but may require RUE amputation should condition worsen - Follow WBC.  Hypotension Chronic and is on midodrine   Vulvovaginal candidiasis - s/p Diflucan x 1 time   Hemorrhoids: new problem - Continue Anusol  End-stage renal disease on dialysis Renal following and managing.   Uncontrolled mellitus Continue Lantus with SSI  Peripheral vascular disease Continue aspirin  DVT prophylaxis: subcutaneous heparin  Diet:  Renal/diabetic  Code Status: Full code Family Communication: None at bedside Disposition Plan: Continue step down monitoring secondary to hypotension in context of active infection.   Consultants:  Nephrology  Hand surgery  Procedures:  Patient of multiple fingers, right hand I&D on 3/13  Antibiotics:   IV vancomycin since 3/13  HPI/Subjective: Patient resting comfortably. No new complaints reported.  Objective: Filed Vitals:   11/18/15 1300 11/18/15 1616  BP: 95/52 78/39  Pulse: 67 63  Temp: 98 F (36.7 C) 98.2 F (36.8 C)  Resp: 21 23    Intake/Output Summary (Last 24 hours) at 11/18/15 1733 Last data filed at 11/18/15 1300  Gross per 24 hour  Intake      0 ml  Output   2002 ml  Net  -2002 ml   Filed Weights   11/17/15 0442 11/18/15 0820 11/18/15 1300  Weight: 112.7 kg (248 lb 7.3 oz) 105 kg (231 lb 7.7 oz) 102.5 kg (225 lb 15.5 oz)    Exam:   General:  Middle aged female, awake and alert  HEENT: Moist mucosa, supple neck  Chest: Clear bilaterally, no wheezes  CVS: Normal S1-S2, no murmurs or gallop  GI: Soft, nondistended, nontender, bowel sounds present  Musculoskeletal: Dressing over right hand intact, dressing over left thumb and index finger intact  CNS: Alert and oriented   Data Reviewed: Basic Metabolic Panel:  Recent Labs Lab 11/13/15 0540 11/13/15 0657 11/13/2015 1327 11/24/2015 1604 11/03/2015 0836 11/17/15 0206 11/18/15 0917  NA 131* 131* 132* 135 131* 131* 138  K 4.1 4.1 4.1 3.9 4.8 5.2* 4.6  CL 91* 92*  --  94*  --  94* 96*  CO2 24 24  --  22  --  16* 21*  GLUCOSE  282* 270* 278* 217* 215* 325* 223*  BUN 39* 41*  --  38*  --  83* 55*  CREATININE 5.31* 5.19*  --  4.08*  --  7.17* 5.67*  CALCIUM 7.5* 7.4*  --  8.2*  --  8.3* 8.2*  PHOS  --  5.3*  --  3.9  --  5.7* 3.4   Liver Function Tests:  Recent Labs Lab 11/12/15 0448 11/13/15 0657 11/18/2015 1604 11/17/15 0206 11/18/15 0917  AST 20  --   --   --   --   ALT 13*   --   --   --   --   ALKPHOS 209*  --   --   --   --   BILITOT 1.4*  --   --   --   --   PROT 6.1*  --   --   --   --   ALBUMIN 2.4* 2.1* 2.1* 1.8* 1.7*   No results for input(s): LIPASE, AMYLASE in the last 168 hours. No results for input(s): AMMONIA in the last 168 hours. CBC:  Recent Labs Lab 11/12/15 0448 11/13/15 0540 11/13/15 0657 11/18/2015 1327 11/27/2015 1604 11/18/2015 0836  WBC 17.5* 16.4* 15.3*  --  12.6*  --   HGB 9.5* 9.0* 8.8* 10.9* 9.9* 11.9*  HCT 32.8* 31.9* 30.0* 32.0* 36.1 35.0*  MCV 90.1 90.9 89.6  --  92.6  --   PLT 167 207 184  --  228  --    Cardiac Enzymes: No results for input(s): CKTOTAL, CKMB, CKMBINDEX, TROPONINI in the last 168 hours. BNP (last 3 results) No results for input(s): BNP in the last 8760 hours.  ProBNP (last 3 results) No results for input(s): PROBNP in the last 8760 hours.  CBG:  Recent Labs Lab 11/15/2015 2131 11/17/15 0852 11/17/15 1113 11/17/15 1755 11/18/15 1341  GLUCAP 264* 193* 215* 202* 98    Recent Results (from the past 240 hour(s))  Culture, blood (routine x 2)     Status: None   Collection Time: 11/19/2015 11:02 AM  Result Value Ref Range Status   Specimen Description BLOOD RIGHT ANTECUBITAL  Final   Special Requests BOTTLES DRAWN AEROBIC AND ANAEROBIC 5CC  Final   Culture NO GROWTH 5 DAYS  Final   Report Status 11/10/2015 FINAL  Final  Culture, blood (routine x 2)     Status: None   Collection Time: 11/03/2015 11:11 AM  Result Value Ref Range Status   Specimen Description BLOOD BLOOD RIGHT FOREARM  Final   Special Requests BOTTLES DRAWN AEROBIC AND ANAEROBIC 5CC  Final   Culture NO GROWTH 5 DAYS  Final   Report Status 11/08/2015 FINAL  Final  Culture, routine-abscess     Status: None   Collection Time: 11/29/2015  9:04 PM  Result Value Ref Range Status   Specimen Description ABSCESS  Final   Special Requests   Final    LEFT HAND PALMAR ABSCESS OVER MEDIAL METACARPALS PATIENT ON FOLLOWING VANCOMYCIN   Gram Stain    Final    FEW WBC PRESENT,BOTH PMN AND MONONUCLEAR NO SQUAMOUS EPITHELIAL CELLS SEEN FEW GRAM POSITIVE COCCI IN PAIRS FEW GRAM POSITIVE RODS RARE GRAM NEGATIVE RODS Performed at [facility]    Culture   Final    MULTIPLE ORGANISMS PRESENT, NONE PREDOMINANT Note: NO STAPHYLOCOCCUS AUREUS ISOLATED NO GROUP A STREP (S.PYOGENES) ISOLATED Performed at [facility]    Report Status 11/21/2015 FINAL  Final  Anaerobic culture  Status: None   Collection Time: 11/03/2015  9:05 PM  Result Value Ref Range Status   Specimen Description ABSCESS  Final   Special Requests   Final    LEFT HAND PALMAR ABSCESS OVER MEDIAL METACARPALS PATIENT ON FOLLOWING VANCOMYCIN   Gram Stain   Final    FEW WBC PRESENT,BOTH PMN AND MONONUCLEAR NO SQUAMOUS EPITHELIAL CELLS SEEN MODERATE GRAM POSITIVE COCCI IN PAIRS FEW GRAM POSITIVE RODS RARE GRAM NEGATIVE RODS Performed at [facility]    Culture   Final    NO ANAEROBES ISOLATED Performed at [facility]    Report Status 11/12/2015 FINAL  Final  MRSA PCR Screening     Status: None   Collection Time: 11/12/15  2:15 AM  Result Value Ref Range Status   MRSA by PCR NEGATIVE NEGATIVE Final    Comment:        The GeneXpert MRSA Assay (FDA approved for NASAL specimens only), is one component of a comprehensive MRSA colonization surveillance program. It is not intended to diagnose MRSA infection nor to guide or monitor treatment for MRSA infections.      Studies: No results found.  Scheduled Meds: . acidophilus  1 capsule Oral Daily  . aspirin EC  81 mg Oral QHS  . calcitRIOL  0.75 mcg Oral Q M,W,F-HD  . cefTAZidime (FORTAZ)  IV  2 g Intravenous Q M,W,F-1800  . cinacalcet  60 mg Oral Q breakfast  . [START ON 11/07/2015] cosyntropin  0.25 mg Intravenous Once  . gabapentin  200 mg Oral BID  . hydrocortisone   Rectal BID  . insulin aspart  0-5 Units Subcutaneous QHS  . insulin aspart  0-9 Units Subcutaneous TID WC   . insulin aspart  4 Units Subcutaneous TID WC  . insulin detemir  50 Units Subcutaneous QHS  . midodrine  20 mg Oral TID WC  . multivitamin  1 tablet Oral QHS  . pantoprazole  40 mg Oral Daily  . sevelamer carbonate  4,000 mg Oral TID WC   Continuous Infusions: . sodium chloride 10 mL/hr at 11/19/2015 1310    Time spent:35 minutes  Velvet Bathe  [facility] Pager [phone] If 7PM-7AM, please contact night-coverage at [email or web address], password [facility] 11/18/2015, 5:33 PM  LOS: 7 days

## 2015-11-18 NOTE — Progress Notes (Signed)
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  Long finger dusky.  Wound vac not changed since placed in OR on Saturday.  D/w patient and husband plan for OR tomorrow for debridement versus amputation of hand, hopefully being able to salvage the thumb, using the ulnar ST flap to assist with closure.  May ultimately require more proximal amputation at wrist.    Micheline Rough, MD [facility] [phone]

## 2015-11-18 NOTE — Progress Notes (Addendum)
Assessment:  1. Sepsis secondary to wounds R. Hand- s/p partial digital amputations & debridements  2. ESRD -MWF at [facility]. HD today 3. Chronic Hypotension - On Midodrine, check ACTH stim test 4. S/p hand surgeries for infx - Fortaz 5. Altered mental status- resolved , but w asterixis will hold neurontin 6. Volume - doubt accuracy of weights, min UF today      (5 hrs 0 min, 160NRe Optiflux, BFR 400, DFR Manual 800 mL/min, EDW 105)  Subjective: Interval History: no complaints  Objective: Vital signs in last 24 hours: Temp:  [96.2 F (35.7 C)-98.3 F (36.8 C)] 98.3 F (36.8 C) (03/20 0250) Pulse Rate:  [59-128] 70 (03/20 0255) Resp:  [12-23] 23 (03/20 0255) BP: (66-88)/(25-60) 83/56 mmHg (03/20 0255) SpO2:  [86 %-99 %] 96 % (03/20 0255) Weight change:   Intake/Output from previous day: 03/19 0701 - 03/20 0700 In: -  Out: 2 [Stool:2] Intake/Output this shift:   Awake, jerking mildly, O x 3 No jvd Chest clear bilat RRR no mrg Abd soft ntnd obese no ascites Bilat hands are wrapped up No LE edema Neuro nonfocal  Lab Results:  Recent Labs  11/17/2015 0836  HGB 11.9*  HCT 35.0*   BMET:   Recent Labs  11/21/2015 0836 11/17/15 0206  NA 131* 131*  K 4.8 5.2*  CL  --  94*  CO2  --  16*  GLUCOSE 215* 325*  BUN  --  83*  CREATININE  --  7.17*  CALCIUM  --  8.3*   No results for input(s): PTH in the last 72 hours. Iron Studies: No results for input(s): IRON, TIBC, TRANSFERRIN, FERRITIN in the last 72 hours. Studies/Results: No results found.  Scheduled: . acidophilus  1 capsule Oral Daily  . aspirin EC  81 mg Oral QHS  . calcitRIOL  0.75 mcg Oral Q M,W,F-HD  . cefTAZidime (FORTAZ)  IV  2 g Intravenous Q M,W,F-1800  . cinacalcet  60 mg Oral Q breakfast  . gabapentin  200 mg Oral BID  . hydrocortisone   Rectal BID  . insulin aspart  0-5 Units Subcutaneous QHS  . insulin aspart  0-9 Units Subcutaneous TID WC  . insulin aspart  4 Units Subcutaneous TID  WC  . insulin detemir  50 Units Subcutaneous QHS  . midodrine  20 mg Oral TID WC  . multivitamin  1 tablet Oral QHS  . pantoprazole  40 mg Oral Daily  . sevelamer carbonate  4,000 mg Oral TID WC     LOS: 7 days   [name] D 11/18/2015,8:12 AM

## 2015-11-18 NOTE — Care Management Important Message (Signed)
Important Message  Patient Details  Name: [name] MRN: [id number] Date of Birth: 08/08/1966   Medicare Important Message Given:  Yes    Algernon Mundie P [name] 11/18/2015, 3:40 PM

## 2015-11-19 ENCOUNTER — Inpatient Hospital Stay (HOSPITAL_COMMUNITY): Payer: Medicare Other | Admitting: Anesthesiology

## 2015-11-19 ENCOUNTER — Inpatient Hospital Stay (HOSPITAL_COMMUNITY): Admission: EM | Disposition: E | Payer: Self-pay | Source: Home / Self Care | Attending: Family Medicine

## 2015-11-19 ENCOUNTER — Encounter (HOSPITAL_COMMUNITY): Payer: Self-pay | Admitting: Anesthesiology

## 2015-11-19 HISTORY — PX: AMPUTATION: SHX166

## 2015-11-19 HISTORY — PX: INCISION AND DRAINAGE OF WOUND: SHX1803

## 2015-11-19 LAB — GLUCOSE, CAPILLARY
Glucose-Capillary: 83 mg/dL (ref 65–99)
Glucose-Capillary: 94 mg/dL (ref 65–99)
Glucose-Capillary: 79 mg/dL (ref 65–99)
Glucose-Capillary: 105 mg/dL — ABNORMAL HIGH (ref 65–99)
Glucose-Capillary: 105 mg/dL — ABNORMAL HIGH (ref 65–99)

## 2015-11-19 LAB — POCT I-STAT 4, (NA,K, GLUC, HGB,HCT)
Glucose, Bld: 91 mg/dL (ref 65–99)
HCT: 34 % — ABNORMAL LOW (ref 36.0–46.0)
Hemoglobin: 11.6 g/dL — ABNORMAL LOW (ref 12.0–15.0)
POTASSIUM: 4.1 mmol/L (ref 3.5–5.1)
SODIUM: 136 mmol/L (ref 135–145)

## 2015-11-19 LAB — ACTH STIMULATION, 3 TIME POINTS
Cortisol, 30 Min: 35.6 ug/dL
Cortisol, 60 Min: 37.8 ug/dL
Cortisol, Base: 28.6 ug/dL

## 2015-11-19 LAB — HCG, SERUM, QUALITATIVE: PREG SERUM: NEGATIVE

## 2015-11-19 SURGERY — IRRIGATION AND DEBRIDEMENT WOUND
Anesthesia: General | Site: Hand | Laterality: Right

## 2015-11-19 MED ORDER — PROMETHAZINE HCL 25 MG/ML IJ SOLN: 6.2500 mg | INTRAMUSCULAR | Status: DC | PRN

## 2015-11-19 MED ORDER — EPHEDRINE SULFATE 50 MG/ML IJ SOLN
INTRAMUSCULAR | Status: AC
  Filled 2015-11-19: qty 1

## 2015-11-19 MED ORDER — FENTANYL CITRATE (PF) 100 MCG/2ML IJ SOLN
INTRAMUSCULAR | Status: DC | PRN
  Administered 2015-11-19: 50 ug via INTRAVENOUS
  Administered 2015-11-19: 100 ug via INTRAVENOUS
  Administered 2015-11-19 (×2): 50 ug via INTRAVENOUS

## 2015-11-19 MED ORDER — SODIUM CHLORIDE 0.9 % IV SOLN: 100.0000 mL | INTRAVENOUS | Status: DC | PRN

## 2015-11-19 MED ORDER — PHENYLEPHRINE 40 MCG/ML (10ML) SYRINGE FOR IV PUSH (FOR BLOOD PRESSURE SUPPORT)
PREFILLED_SYRINGE | INTRAVENOUS | Status: AC
  Filled 2015-11-19: qty 30

## 2015-11-19 MED ORDER — BACITRACIN ZINC 500 UNIT/GM EX OINT
TOPICAL_OINTMENT | Freq: Every day | CUTANEOUS | Status: DC
  Administered 2015-11-19: 31.5556 via TOPICAL
  Administered 2015-11-21: 1 via TOPICAL
  Administered 2015-11-23 – 2015-11-24 (×2): 31.5556 via TOPICAL
  Filled 2015-11-19: qty 28.35

## 2015-11-19 MED ORDER — FENTANYL CITRATE (PF) 250 MCG/5ML IJ SOLN
INTRAMUSCULAR | Status: AC
  Filled 2015-11-19: qty 5

## 2015-11-19 MED ORDER — MEPERIDINE HCL 25 MG/ML IJ SOLN: 6.2500 mg | INTRAMUSCULAR | Status: DC | PRN

## 2015-11-19 MED ORDER — SODIUM CHLORIDE 0.9 % IR SOLN
Status: DC | PRN
  Administered 2015-11-19 (×2): 3000 mL
  Administered 2015-11-19: 1000 mL

## 2015-11-19 MED ORDER — LIDOCAINE HCL (PF) 1 % IJ SOLN
5.0000 mL | INTRAMUSCULAR | Status: DC | PRN
  Filled 2015-11-19: qty 5

## 2015-11-19 MED ORDER — PROPOFOL 10 MG/ML IV BOLUS
INTRAVENOUS | Status: DC | PRN
  Administered 2015-11-19: 100 mg via INTRAVENOUS
  Administered 2015-11-19: 20 mg via INTRAVENOUS

## 2015-11-19 MED ORDER — IBUPROFEN 200 MG PO TABS
600.0000 mg | ORAL_TABLET | Freq: Four times a day (QID) | ORAL | Status: DC | PRN
  Administered 2015-11-19: 600 mg via ORAL
  Filled 2015-11-19: qty 3

## 2015-11-19 MED ORDER — MIDAZOLAM HCL 2 MG/2ML IJ SOLN
INTRAMUSCULAR | Status: DC | PRN
  Administered 2015-11-19: 2 mg via INTRAVENOUS

## 2015-11-19 MED ORDER — EPHEDRINE SULFATE 50 MG/ML IJ SOLN
INTRAMUSCULAR | Status: DC | PRN
  Administered 2015-11-19 (×3): 10 mg via INTRAVENOUS
  Administered 2015-11-19: 5 mg via INTRAVENOUS
  Administered 2015-11-19: 10 mg via INTRAVENOUS
  Administered 2015-11-19: 5 mg via INTRAVENOUS

## 2015-11-19 MED ORDER — PHENYLEPHRINE HCL 10 MG/ML IJ SOLN
INTRAMUSCULAR | Status: DC | PRN
  Administered 2015-11-19: 80 ug via INTRAVENOUS
  Administered 2015-11-19: 40 ug via INTRAVENOUS
  Administered 2015-11-19 (×2): 80 ug via INTRAVENOUS
  Administered 2015-11-19: 40 ug via INTRAVENOUS
  Administered 2015-11-19: 80 ug via INTRAVENOUS

## 2015-11-19 MED ORDER — SODIUM CHLORIDE 0.9 % IJ SOLN
INTRAMUSCULAR | Status: AC
  Filled 2015-11-19: qty 10

## 2015-11-19 MED ORDER — LIDOCAINE HCL (CARDIAC) 10 MG/ML IV SOLN
INTRAVENOUS | Status: DC | PRN
  Administered 2015-11-19: 50 mg via INTRAVENOUS

## 2015-11-19 MED ORDER — MIDAZOLAM HCL 2 MG/2ML IJ SOLN
INTRAMUSCULAR | Status: AC
  Filled 2015-11-19: qty 2

## 2015-11-19 MED ORDER — ONDANSETRON HCL 4 MG/2ML IJ SOLN
INTRAMUSCULAR | Status: DC | PRN
  Administered 2015-11-19: 4 mg via INTRAVENOUS

## 2015-11-19 MED ORDER — PENTAFLUOROPROP-TETRAFLUOROETH EX AERO
1.0000 "application " | INHALATION_SPRAY | CUTANEOUS | Status: DC | PRN
  Filled 2015-11-19: qty 30

## 2015-11-19 MED ORDER — GLYCOPYRROLATE 0.2 MG/ML IJ SOLN
INTRAMUSCULAR | Status: DC | PRN
Start: 2015-11-19 — End: 2015-11-19
  Administered 2015-11-19: 0.2 mg via INTRAVENOUS

## 2015-11-19 MED ORDER — BACITRACIN ZINC 500 UNIT/GM EX OINT
TOPICAL_OINTMENT | CUTANEOUS | Status: AC
  Filled 2015-11-19: qty 28.35

## 2015-11-19 MED ORDER — GLYCOPYRROLATE 0.2 MG/ML IJ SOLN
INTRAMUSCULAR | Status: AC
  Filled 2015-11-19: qty 1

## 2015-11-19 MED ORDER — ONDANSETRON HCL 4 MG/2ML IJ SOLN
INTRAMUSCULAR | Status: AC
  Filled 2015-11-19: qty 2

## 2015-11-19 MED ORDER — LIDOCAINE-PRILOCAINE 2.5-2.5 % EX CREA
1.0000 | TOPICAL_CREAM | CUTANEOUS | Status: DC | PRN
Start: 2015-11-19 — End: 2015-11-20
  Filled 2015-11-19: qty 5

## 2015-11-19 MED ORDER — ALTEPLASE 2 MG IJ SOLR
2.0000 mg | Freq: Once | INTRAMUSCULAR | Status: DC | PRN
  Filled 2015-11-19: qty 2

## 2015-11-19 MED ORDER — LIDOCAINE HCL (CARDIAC) 20 MG/ML IV SOLN
INTRAVENOUS | Status: AC
  Filled 2015-11-19: qty 5

## 2015-11-19 MED ORDER — HEPARIN SODIUM (PORCINE) 1000 UNIT/ML DIALYSIS
1000.0000 [IU] | INTRAMUSCULAR | Status: DC | PRN
  Filled 2015-11-19: qty 1

## 2015-11-19 MED ORDER — HYDROMORPHONE HCL 1 MG/ML IJ SOLN: 0.2500 mg | INTRAMUSCULAR | Status: DC | PRN

## 2015-11-19 MED ORDER — PROPOFOL 10 MG/ML IV BOLUS
INTRAVENOUS | Status: AC
  Filled 2015-11-19: qty 20

## 2015-11-19 MED ORDER — MIDAZOLAM HCL 2 MG/2ML IJ SOLN: 0.5000 mg | Freq: Once | INTRAMUSCULAR | Status: DC | PRN

## 2015-11-19 SURGICAL SUPPLY — 63 items
BLADE SURG 15 STRL LF DISP TIS (BLADE) IMPLANT
BLADE SURG 15 STRL SS (BLADE) ×8
BNDG CMPR 9X4 STRL LF SNTH (GAUZE/BANDAGES/DRESSINGS) ×2
BNDG COHESIVE 4X5 TAN STRL (GAUZE/BANDAGES/DRESSINGS) ×4 IMPLANT
BNDG ESMARK 4X9 LF (GAUZE/BANDAGES/DRESSINGS) ×2 IMPLANT
BNDG GAUZE ELAST 4 BULKY (GAUZE/BANDAGES/DRESSINGS) ×4 IMPLANT
CANISTER SUCTION WELLS/JOHNSON (MISCELLANEOUS) ×2 IMPLANT
CHLORAPREP W/TINT 26ML (MISCELLANEOUS) ×2 IMPLANT
COVER SURGICAL LIGHT HANDLE (MISCELLANEOUS) ×4 IMPLANT
CUFF TOURNIQUET SINGLE 18IN (TOURNIQUET CUFF) ×2 IMPLANT
CUFF TOURNIQUET SINGLE 24IN (TOURNIQUET CUFF) IMPLANT
DRAIN CHANNEL 19F RND (DRAIN) ×2 IMPLANT
DRAPE SURG 17X23 STRL (DRAPES) ×4 IMPLANT
DRSG ADAPTIC 3X8 NADH LF (GAUZE/BANDAGES/DRESSINGS) ×4 IMPLANT
ELECT CAUTERY BLADE 6.4 (BLADE) ×2 IMPLANT
ELECT REM PT RETURN 9FT ADLT (ELECTROSURGICAL) ×4
ELECTRODE REM PT RTRN 9FT ADLT (ELECTROSURGICAL) IMPLANT
EVACUATOR 1/8 PVC DRAIN (DRAIN) IMPLANT
EVACUATOR SILICONE 100CC (DRAIN) ×2 IMPLANT
GAUZE SPONGE 4X4 12PLY STRL (GAUZE/BANDAGES/DRESSINGS) ×4 IMPLANT
GLOVE BIO SURGEON STRL SZ 6.5 (GLOVE) ×5 IMPLANT
GLOVE BIO SURGEON STRL SZ7.5 (GLOVE) ×4 IMPLANT
GLOVE BIO SURGEONS STRL SZ 6.5 (GLOVE) ×3
GLOVE BIOGEL PI IND STRL 6.5 (GLOVE) IMPLANT
GLOVE BIOGEL PI IND STRL 7.0 (GLOVE) ×2 IMPLANT
GLOVE BIOGEL PI IND STRL 8 (GLOVE) ×2 IMPLANT
GLOVE BIOGEL PI INDICATOR 6.5 (GLOVE) ×2
GLOVE BIOGEL PI INDICATOR 7.0 (GLOVE) ×4
GLOVE BIOGEL PI INDICATOR 8 (GLOVE) ×2
GOWN STRL REUS W/ TWL LRG LVL3 (GOWN DISPOSABLE) ×6 IMPLANT
GOWN STRL REUS W/TWL LRG LVL3 (GOWN DISPOSABLE) ×20
HANDPIECE INTERPULSE COAX TIP (DISPOSABLE)
KIT BASIN OR (CUSTOM PROCEDURE TRAY) ×4 IMPLANT
KIT ROOM TURNOVER OR (KITS) ×4 IMPLANT
MANIFOLD NEPTUNE II (INSTRUMENTS) ×4 IMPLANT
NS IRRIG 1000ML POUR BTL (IV SOLUTION) ×4 IMPLANT
PACK ORTHO EXTREMITY (CUSTOM PROCEDURE TRAY) ×4 IMPLANT
PAD ARMBOARD 7.5X6 YLW CONV (MISCELLANEOUS) ×8 IMPLANT
PAD CAST 4YDX4 CTTN HI CHSV (CAST SUPPLIES) IMPLANT
PADDING CAST COTTON 4X4 STRL (CAST SUPPLIES) ×4
PIN SAFETY STERILE (MISCELLANEOUS) ×2 IMPLANT
SET CYSTO W/LG BORE CLAMP LF (SET/KITS/TRAYS/PACK) ×2 IMPLANT
SET HNDPC FAN SPRY TIP SCT (DISPOSABLE) IMPLANT
SET IRRIG Y TYPE TUR BLADDER L (SET/KITS/TRAYS/PACK) IMPLANT
SPONGE LAP 18X18 X RAY DECT (DISPOSABLE) ×2 IMPLANT
STOCKINETTE IMPERVIOUS 9X36 MD (GAUZE/BANDAGES/DRESSINGS) IMPLANT
SUT ETHILON 4 0 PS 2 18 (SUTURE) IMPLANT
SUT PROLENE 0 SH 30 (SUTURE) ×2 IMPLANT
SUT PROLENE 1 CT (SUTURE) IMPLANT
SUT PROLENE 2 0 SH DA (SUTURE) ×2 IMPLANT
SUT VIC AB 0 CT1 27 (SUTURE) ×4
SUT VIC AB 0 CT1 27XBRD ANBCTR (SUTURE) IMPLANT
SUT VICRYL RAPIDE 4/0 PS 2 (SUTURE) ×4 IMPLANT
SYRINGE 10CC LL (SYRINGE) IMPLANT
TOWEL OR 17X24 6PK STRL BLUE (TOWEL DISPOSABLE) ×4 IMPLANT
TOWEL OR 17X26 10 PK STRL BLUE (TOWEL DISPOSABLE) ×4 IMPLANT
TUBE ANAEROBIC SPECIMEN COL (MISCELLANEOUS) IMPLANT
TUBE CONNECTING 12'X1/4 (SUCTIONS) ×1
TUBE CONNECTING 12X1/4 (SUCTIONS) ×3 IMPLANT
TUBING CYSTO DISP (UROLOGICAL SUPPLIES) ×2 IMPLANT
UNDERPAD 30X30 INCONTINENT (UNDERPADS AND DIAPERS) ×4 IMPLANT
WATER STERILE IRR 1000ML POUR (IV SOLUTION) ×2 IMPLANT
YANKAUER SUCT BULB TIP NO VENT (SUCTIONS) ×4 IMPLANT

## 2015-11-19 NOTE — Progress Notes (Signed)
Care of pt assumed by [facility] Carbo RN from [place]

## 2015-11-19 NOTE — Anesthesia Preprocedure Evaluation (Addendum)
Anesthesia Evaluation  Patient identified by MRN, date of birth, ID band Patient awake    Reviewed: Allergy & Precautions, NPO status , Patient's Chart, lab work & pertinent test results  History of Anesthesia Complications Negative for: history of anesthetic complications  Airway Mallampati: I  TM Distance: >3 FB Neck ROM: Full    Dental  (+) Dental Advisory Given   Pulmonary neg pulmonary ROS,    breath sounds clear to auscultation       Cardiovascular (-) angina+ DVT   Rhythm:Regular Rate:Normal     Neuro/Psych negative neurological ROS     GI/Hepatic Neg liver ROS, GERD  Medicated and Controlled,  Endo/Other  diabetes (glu 94), Insulin DependentMorbid obesity  Renal/GU Dialysis and ESRFRenal disease (MWF)     Musculoskeletal   Abdominal (+) + obese,   Peds  Hematology   Anesthesia Other Findings   Reproductive/Obstetrics                          Anesthesia Physical Anesthesia Plan  ASA: III  Anesthesia Plan: General   Post-op Pain Management:    Induction: Intravenous  Airway Management Planned: LMA  Additional Equipment:   Intra-op Plan:   Post-operative Plan:   Informed Consent: I have reviewed the patients History and Physical, chart, labs and discussed the procedure including the risks, benefits and alternatives for the proposed anesthesia with the patient or authorized representative who has indicated his/her understanding and acceptance.   Dental advisory given  Plan Discussed with: CRNA and Surgeon  Anesthesia Plan Comments: (Plan routine monitors, GA- LMA OK)        Anesthesia Quick Evaluation

## 2015-11-19 NOTE — Anesthesia Postprocedure Evaluation (Signed)
Anesthesia Post Note  Patient: [name]  Procedure(s) Performed: Procedure(s) (LRB): IRRIGATION AND DEBRIDEMENT RIGHT HAND WOUND (Right) transmetacarpel amputation of right hand (Right)  Patient location during evaluation: PACU Anesthesia Type: General Level of consciousness: awake and alert, oriented and patient cooperative Pain management: pain level controlled Vital Signs Assessment: post-procedure vital signs reviewed and stable Respiratory status: spontaneous breathing, nonlabored ventilation, respiratory function stable and patient connected to nasal cannula oxygen Cardiovascular status: blood pressure returned to baseline and stable Postop Assessment: no signs of nausea or vomiting Anesthetic complications: no    Last Vitals:  Filed Vitals:   11/03/2015 1712 11/06/2015 1727  BP: 81/45 86/45  Pulse: 75 79  Temp:  36.7 C  Resp: 25 23    Last Pain:  Filed Vitals:   11/13/2015 1730  PainSc: 0-No pain                 Nello Corro,E. Priyansh Pry

## 2015-11-19 NOTE — Op Note (Signed)
11/29/2015 - 11/24/2015  4:36 PM  PATIENT:  [name]  50 y.o. female  PRE-OPERATIVE DIAGNOSIS:  Right hand necrosis  POST-OPERATIVE DIAGNOSIS:  Same  PROCEDURE:  Right hand transmetacarpal amputation, with digital fillet-flap closure  SURGEON: Rayvon Char. Grandville Silos, MD  PHYSICIAN ASSISTANT: Morley Kos, OPA-C  ANESTHESIA:  general  SPECIMENS:  None  DRAINS:   None  EBL:  less than 100 mL  PREOPERATIVE INDICATIONS:  [name] is a  50 y.o. female with progressive necrosis of the right hand  The risks benefits and alternatives were discussed with the patient preoperatively including but not limited to the risks of infection, bleeding, nerve injury, cardiopulmonary complications, the need for revision surgery, among others, and the patient verbalized understanding and consented to proceed.  OPERATIVE IMPLANTS: None  OPERATIVE PROCEDURE:  The patient was escorted to the operative theatre and placed in a supine position.  Gen. Anesthesia was administered. The wound VAC dressing was removed. The hand was surveyed, and found to have progressive necrosis of the long finger as well as the tissue in the first webspace both volarly and dorsally.  A surgical "time-out" was performed during which the planned procedure, proposed operative site, and the correct patient identity were compared to the operative consent and agreement confirmed by the circulating nurse according to current facility policy.  The limb was pre-scrubbed with Hibiclens scrub brush performing formal prep with Betadine and draped in usual sterile fashion.  The wound was copiously irrigated as there was still a generous amount of thin and watery fluid throughout the exposure even into the mid forearm. After copiously irrigating with running lavage irrigation, attention was directed to the debridement. The flexor tendons to the digits that remained were transected at the muscle-tendon junction and the tendons  were then pulled distally. The FPL was left, the median nerve was identified and the sensory branches to the thumb as well as the thenar muscles was preserved and remainder of the distal branches were divided. The mid portions of the superficial palmar arch which was dark and necrotic without blood flow was ligated with suture and divided. The central portion was excised. The metacarpals were sequentially exposed and divided near the proximal metaphysis of that they could be reflected distally. The skin edges around the base of the thumb and thenar musculature were freshened to bleeding edges and all of the metacarpals and hand were reflected distally and removed from the specimen. The ring finger was split down the midline volarly and filleted.  The small finger was amputated near its base and the skeletal remnants removed. The wounds were again irrigated. Closure was performed directly at the forearm and through the carpal tunnel with a combination of oh and 2-0 Prolene suture and 4-0 Vicryl.. The fillet flap created from the ring finger was used to help augment the skin that had been excised in the dorsal first webspace and it was trimmed to better fit and inset with the same combination of sutures. A Blake drain was placed exiting the skin over the dorsum of the carpus and wrapped around through the carpal tunnel and into the mid forearm, having a wide swath available drainage. The drain was sewn in place with 2-0 Prolene suture and one placed to suction it was maintained without leak. A bulky forearm dressing was applied and she was awakened and taken to recovery in stable condition, breathing spontaneously  DISPOSITION: She'll return to the floor for continued medical management of multiple issues. Depending upon  her clinical improvement as well as the character and degree of the drainage from the drain, she may need to return to the operating room again for irrigation and debridement, possibly even revision  amputation at the level of the wrist.

## 2015-11-19 NOTE — Care Management Note (Signed)
Case Management Note  Patient Details  Name: [name] MRN: [id number] Date of Birth: 02-15-1966  Subjective/Objective:  Sepsis secondary to wounds R. Hand- s/p partial digital amputations & debridements                   Action/Plan: Discharge Planning: [employer] rep, Ricki following for wound vac. Waiting final recommendations for home. Pt may need wound vac for home and HH.    Expected Discharge Date:                  Expected Discharge Plan:  [name]  In-House Referral:     Discharge planning Services  CM Consult   Status of Service:  In process, will continue to follow  Medicare Important Message Given:  Yes Date Medicare IM Given:    Medicare IM give by:    Date Additional Medicare IM Given:    Additional Medicare Important Message give by:     If discussed at [place] of Stay Meetings, dates discussed:    Additional Comments:  Erenest Rasher, RN 10/30/2015, 1:55 PM

## 2015-11-19 NOTE — Progress Notes (Signed)
Placed on tele to monitor  

## 2015-11-19 NOTE — Progress Notes (Signed)
Assessment:  1. Sepsis secondary to wounds R. Hand- in OR today 2. ESRD -MWF at [facility]. HD tomorrow 3. Chronic Hypotension - On Midodrine, check ACTH stim test 4. S/p hand surgeries for infx - Fortaz 5. Altered mental status- resolved , but w asterixis will hold neurontin 6. Volume - doubt accuracy of weights, min UF today       Plan - HD Wednesday.   Jalayne Splinter MD [facility] pager [phone]    cell [phone] 11/24/2015, 5:13 PM   (5 hrs 0 min, 160NRe Optiflux, BFR 400, DFR Manual 800 mL/min, EDW 105)  Subjective: Interval History: no complaints  Objective: Vital signs in last 24 hours: Temp:  [98.2 F (36.8 C)-99.7 F (37.6 C)] 98.2 F (36.8 C) (03/21 1636) Pulse Rate:  [66-82] 78 (03/21 1704) Resp:  [12-27] 25 (03/21 1704) BP: (72-147)/(39-120) 86/43 mmHg (03/21 1704) SpO2:  [89 %-100 %] 97 % (03/21 1704) Weight:  [102 kg (224 lb 13.9 oz)] 102 kg (224 lb 13.9 oz) (03/21 0455) Weight change:   Intake/Output from previous day: 03/20 0701 - 03/21 0700 In: -  Out: 2060 [Drains:60] Intake/Output this shift: Total I/O In: 930 [P.O.:30; I.V.:900] Out: 100 [Blood:100] Not in room today, in OR Neck -  Chest - Cor - Abd -  Ext -  Neuro -  Lab Results:  Recent Labs  11/23/2015 1345  HGB 11.6*  HCT 34.0*   BMET:   Recent Labs  11/17/15 0206 11/18/15 0917 11/18/15 2148 11/15/2015 1345  NA 131* 138  --  136  K 5.2* 4.6  --  4.1  CL 94* 96*  --   --   CO2 16* 21*  --   --   GLUCOSE 325* 223* 138* 91  BUN 83* 55*  --   --   CREATININE 7.17* 5.67*  --   --   CALCIUM 8.3* 8.2*  --   --    No results for input(s): PTH in the last 72 hours. Iron Studies: No results for input(s): IRON, TIBC, TRANSFERRIN, FERRITIN in the last 72 hours. Studies/Results: No results found.  Scheduled: . [MAR Hold] acidophilus  1 capsule Oral Daily  . [MAR Hold] aspirin EC  81 mg Oral QHS  . [MAR Hold] calcitRIOL  0.75 mcg Oral Q M,W,F-HD  . [MAR  Hold] cefTAZidime (FORTAZ)  IV  2 g Intravenous Q M,W,F-1800  . [MAR Hold] cinacalcet  60 mg Oral Q breakfast  . [MAR Hold] hydrocortisone   Rectal BID  . [MAR Hold] insulin aspart  0-5 Units Subcutaneous QHS  . [MAR Hold] insulin aspart  0-9 Units Subcutaneous TID WC  . [MAR Hold] insulin aspart  4 Units Subcutaneous TID WC  . [MAR Hold] insulin detemir  50 Units Subcutaneous QHS  . [MAR Hold] midodrine  20 mg Oral TID WC  . [MAR Hold] multivitamin  1 tablet Oral QHS  . [MAR Hold] pantoprazole  40 mg Oral Daily  . [MAR Hold] sevelamer carbonate  4,000 mg Oral TID WC     LOS: 8 days   Fareed Fung D 11/01/2015,5:11 PM

## 2015-11-19 NOTE — Progress Notes (Signed)
Received report from [facility] in PACU, pt back on unit at 1750. Pt is alert and oriented to self, place and situation. Pt very tearful. Husband at bedside. Consuelo Pandy RN

## 2015-11-19 NOTE — Progress Notes (Signed)
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 She reported having of fever of 101F at home. In the ED she had a temperature of 99.8 Fahrenheit, hypotensive with blood pressure 73/40 mmHg, normal heart rate and normal O2 sat. She was found to have necrosis of multiple fingertips of both her hands. Lactic acid of 5.74. She received 1 L IV normal saline bolus and IV vancomycin. Patient admitted to hospitalist service on stepdown. Seen by hand surgeon and underwent amputation of left thumb, left index finger, right small finger, right ring finger and right hand incision and drainage of mid palmar space with radical flexor tenosynovectomy, flexor tendon excision and open carpal tunnel release. Also had excisional debridement of partial-thickness skin of right dorsal hand and volar wrist wounds.  Assessment/Plan: Sepsis secondary to multiple infected fingers Taken to OR with findings as above. Continue IV antibiotics as recommended by ID Tressie Ellis) - Pt undergoing serial debridements but may require RUE amputation should condition worsen.  Hypotension Chronic and is on midodrine   Vulvovaginal candidiasis - s/p Diflucan x 1 time   Hemorrhoids: new problem - Continue Anusol - Pain control has been a problem as patient has chronic low blood pressures and is unable to take acetaminophen  End-stage renal disease on dialysis Renal following and managing.   Uncontrolled mellitus Continue Lantus with  SSI  Peripheral vascular disease Continue aspirin  DVT prophylaxis: subcutaneous heparin  Diet: Renal/diabetic  Code Status: Full code Family Communication: None at bedside Disposition Plan: Continue step down monitoring secondary to hypotension in context of active infection.   Consultants:  Nephrology  Hand surgery  Procedures:  Patient of multiple fingers, right hand I&D on 3/13  Antibiotics:   IV vancomycin since 3/13  HPI/Subjective: Patient complaining of discomfort from hemorrhoids. She states that acetaminophen lowers her blood pressures  Objective: Filed Vitals:   11/18/2015 0835 11/05/2015 1237  BP: 94/44 82/39  Pulse: 69 68  Temp:    Resp: 12 16    Intake/Output Summary (Last 24 hours) at 11/22/2015 1357 Last data filed at 11/12/2015 1000  Gross per 24 hour  Intake     30 ml  Output     10 ml  Net     20 ml   Filed Weights   11/18/15 0820 11/18/15 1300 11/07/2015 0455  Weight: 105 kg (231 lb 7.7 oz) 102.5 kg (225 lb 15.5 oz) 102 kg (224 lb 13.9 oz)    Exam:   General:  Middle aged female, awake and alert  HEENT: Moist mucosa, supple neck  Chest: Clear bilaterally, no wheezes  CVS: Normal S1-S2, no murmurs or gallop  GI: Soft, nondistended, nontender, bowel sounds present  Musculoskeletal: Dressing over right hand intact, dressing over left thumb and index finger intact  CNS: Alert and oriented   Data Reviewed: Basic Metabolic Panel:  Recent Labs Lab 11/13/15 0540 11/13/15 0657  11/28/2015 1604 11/09/2015 0836 11/17/15 0206 11/18/15 0917 11/18/15 2148 11/03/2015 1345  NA 131* 131*  < > 135 131* 131* 138  --  136  K 4.1 4.1  < >  3.9 4.8 5.2* 4.6  --  4.1  CL 91* 92*  --  94*  --  94* 96*  --   --   CO2 24 24  --  22  --  16* 21*  --   --   GLUCOSE 282* 270*  < > 217* 215* 325* 223* 138* 91  BUN 39* 41*  --  38*  --  83* 55*  --   --   CREATININE 5.31* 5.19*  --  4.08*  --  7.17* 5.67*  --   --   CALCIUM 7.5* 7.4*  --  8.2*  --  8.3*  8.2*  --   --   PHOS  --  5.3*  --  3.9  --  5.7* 3.4  --   --   < > = values in this interval not displayed. Liver Function Tests:  Recent Labs Lab 11/13/15 0657 11/25/2015 1604 11/17/15 0206 11/18/15 0917  ALBUMIN 2.1* 2.1* 1.8* 1.7*   No results for input(s): LIPASE, AMYLASE in the last 168 hours. No results for input(s): AMMONIA in the last 168 hours. CBC:  Recent Labs Lab 11/13/15 0540 11/13/15 0657 11/28/2015 1327 11/28/2015 1604 10/31/2015 0836 11/11/2015 1345  WBC 16.4* 15.3*  --  12.6*  --   --   HGB 9.0* 8.8* 10.9* 9.9* 11.9* 11.6*  HCT 31.9* 30.0* 32.0* 36.1 35.0* 34.0*  MCV 90.9 89.6  --  92.6  --   --   PLT 207 184  --  228  --   --    Cardiac Enzymes: No results for input(s): CKTOTAL, CKMB, CKMBINDEX, TROPONINI in the last 168 hours. BNP (last 3 results) No results for input(s): BNP in the last 8760 hours.  ProBNP (last 3 results) No results for input(s): PROBNP in the last 8760 hours.  CBG:  Recent Labs Lab 11/17/15 1755 11/18/15 1341 11/18/15 1609 11/17/2015 0900 11/21/2015 1229  GLUCAP 202* 98 99 83 94    Recent Results (from the past 240 hour(s))  Culture, blood (routine x 2)     Status: None   Collection Time: 11/06/2015 11:02 AM  Result Value Ref Range Status   Specimen Description BLOOD RIGHT ANTECUBITAL  Final   Special Requests BOTTLES DRAWN AEROBIC AND ANAEROBIC 5CC  Final   Culture NO GROWTH 5 DAYS  Final   Report Status 11/08/2015 FINAL  Final  Culture, blood (routine x 2)     Status: None   Collection Time: 11/06/2015 11:11 AM  Result Value Ref Range Status   Specimen Description BLOOD BLOOD RIGHT FOREARM  Final   Special Requests BOTTLES DRAWN AEROBIC AND ANAEROBIC 5CC  Final   Culture NO GROWTH 5 DAYS  Final   Report Status 10/31/2015 FINAL  Final  Culture, routine-abscess     Status: None   Collection Time: 11/20/2015  9:04 PM  Result Value Ref Range Status   Specimen Description ABSCESS  Final   Special Requests   Final    LEFT HAND  PALMAR ABSCESS OVER MEDIAL METACARPALS PATIENT ON FOLLOWING VANCOMYCIN   Gram Stain   Final    FEW WBC PRESENT,BOTH PMN AND MONONUCLEAR NO SQUAMOUS EPITHELIAL CELLS SEEN FEW GRAM POSITIVE COCCI IN PAIRS FEW GRAM POSITIVE RODS RARE GRAM NEGATIVE RODS Performed at [facility]    Culture   Final    MULTIPLE ORGANISMS PRESENT, NONE PREDOMINANT Note: NO STAPHYLOCOCCUS AUREUS ISOLATED NO GROUP A STREP (S.PYOGENES) ISOLATED Performed at [facility]    Report  Status 11/07/2015 FINAL  Final  Anaerobic culture     Status: None   Collection Time: 11/02/2015  9:05 PM  Result Value Ref Range Status   Specimen Description ABSCESS  Final   Special Requests   Final    LEFT HAND PALMAR ABSCESS OVER MEDIAL METACARPALS PATIENT ON FOLLOWING VANCOMYCIN   Gram Stain   Final    FEW WBC PRESENT,BOTH PMN AND MONONUCLEAR NO SQUAMOUS EPITHELIAL CELLS SEEN MODERATE GRAM POSITIVE COCCI IN PAIRS FEW GRAM POSITIVE RODS RARE GRAM NEGATIVE RODS Performed at [facility]    Culture   Final    NO ANAEROBES ISOLATED Performed at [facility]    Report Status 11/23/2015 FINAL  Final  MRSA PCR Screening     Status: None   Collection Time: 11/12/15  2:15 AM  Result Value Ref Range Status   MRSA by PCR NEGATIVE NEGATIVE Final    Comment:        The GeneXpert MRSA Assay (FDA approved for NASAL specimens only), is one component of a comprehensive MRSA colonization surveillance program. It is not intended to diagnose MRSA infection nor to guide or monitor treatment for MRSA infections.      Studies: No results found.  Scheduled Meds: . [MAR Hold] acidophilus  1 capsule Oral Daily  . [MAR Hold] aspirin EC  81 mg Oral QHS  . [MAR Hold] calcitRIOL  0.75 mcg Oral Q M,W,F-HD  . [MAR Hold] cefTAZidime (FORTAZ)  IV  2 g Intravenous Q M,W,F-1800  . [MAR Hold] cinacalcet  60 mg Oral Q breakfast  . [MAR Hold] gabapentin  200 mg Oral BID  . [MAR Hold] hydrocortisone   Rectal  BID  . [MAR Hold] insulin aspart  0-5 Units Subcutaneous QHS  . [MAR Hold] insulin aspart  0-9 Units Subcutaneous TID WC  . [MAR Hold] insulin aspart  4 Units Subcutaneous TID WC  . [MAR Hold] insulin detemir  50 Units Subcutaneous QHS  . [MAR Hold] midodrine  20 mg Oral TID WC  . [MAR Hold] multivitamin  1 tablet Oral QHS  . [MAR Hold] pantoprazole  40 mg Oral Daily  . [MAR Hold] sevelamer carbonate  4,000 mg Oral TID WC   Continuous Infusions: . sodium chloride 10 mL/hr at 10/30/2015 1316    Time spent:35 minutes  Velvet Bathe  [facility] Pager [phone] If 7PM-7AM, please contact night-coverage at [email or web address], password [facility] 11/19/2015, 1:57 PM  LOS: 8 days

## 2015-11-19 NOTE — Transfer of Care (Signed)
Immediate Anesthesia Transfer of Care Note  Patient: [name]  Procedure(s) Performed: Procedure(s): IRRIGATION AND DEBRIDEMENT RIGHT HAND WOUND (Right) transmetacarpel amputation of right hand (Right)  Patient Location: PACU  Anesthesia Type:General  Level of Consciousness: awake and alert   Airway & Oxygen Therapy: Patient Spontanous Breathing and Patient connected to nasal cannula oxygen  Post-op Assessment: Report given to RN, Post -op Vital signs reviewed and stable and Patient moving all extremities X 4  Post vital signs: stable  Last Vitals:  Filed Vitals:   11/24/2015 1237 11/28/2015 1636  BP: 82/39   Pulse: 68   Temp:  36.8 C  Resp: 16     Complications: No apparent anesthesia complications

## 2015-11-20 ENCOUNTER — Inpatient Hospital Stay (HOSPITAL_COMMUNITY): Payer: Medicare Other

## 2015-11-20 ENCOUNTER — Encounter (HOSPITAL_COMMUNITY): Payer: Self-pay | Admitting: Orthopedic Surgery

## 2015-11-20 LAB — GLUCOSE, CAPILLARY
GLUCOSE-CAPILLARY: 133 mg/dL — AB (ref 65–99)
GLUCOSE-CAPILLARY: 74 mg/dL (ref 65–99)
GLUCOSE-CAPILLARY: 66 mg/dL (ref 65–99)
GLUCOSE-CAPILLARY: 73 mg/dL (ref 65–99)

## 2015-11-20 LAB — RENAL FUNCTION PANEL
Albumin: 1.7 g/dL — ABNORMAL LOW (ref 3.5–5.0)
Anion gap: 17 — ABNORMAL HIGH (ref 5–15)
BUN: 40 mg/dL — ABNORMAL HIGH (ref 6–20)
CO2: 24 mmol/L (ref 22–32)
Calcium: 8 mg/dL — ABNORMAL LOW (ref 8.9–10.3)
Chloride: 97 mmol/L — ABNORMAL LOW (ref 101–111)
Creatinine, Ser: 5.17 mg/dL — ABNORMAL HIGH (ref 0.44–1.00)
GFR calc Af Amer: 10 mL/min — ABNORMAL LOW (ref >60)
GFR calc non Af Amer: 9 mL/min — ABNORMAL LOW (ref >60)
Glucose, Bld: 84 mg/dL (ref 65–99)
Phosphorus: 6 mg/dL — ABNORMAL HIGH (ref 2.5–4.6)
Potassium: 4.1 mmol/L (ref 3.5–5.1)
Sodium: 138 mmol/L (ref 135–145)

## 2015-11-20 LAB — HEMOGLOBIN AND HEMATOCRIT, BLOOD
HCT: 29.5 % — ABNORMAL LOW (ref 36.0–46.0)
Hemoglobin: 8 g/dL — ABNORMAL LOW (ref 12.0–15.0)

## 2015-11-20 LAB — PROTIME-INR
INR: 1.37 (ref 0.00–1.49)
Prothrombin Time: 17 seconds — ABNORMAL HIGH (ref 11.6–15.2)

## 2015-11-20 MED ORDER — MIDAZOLAM HCL 2 MG/2ML IJ SOLN
INTRAMUSCULAR | Status: AC | PRN
  Administered 2015-11-20 (×2): 0.5 mg via INTRAVENOUS

## 2015-11-20 MED ORDER — WHITE PETROLATUM GEL
Status: AC
  Administered 2015-11-20: 03:00:00
  Filled 2015-11-20: qty 1

## 2015-11-20 MED ORDER — FLUMAZENIL 0.5 MG/5ML IV SOLN
INTRAVENOUS | Status: AC | PRN
  Administered 2015-11-20: 0.5 mg via INTRAVENOUS

## 2015-11-20 MED ORDER — FLUMAZENIL 0.5 MG/5ML IV SOLN
INTRAVENOUS | Status: AC
  Filled 2015-11-20: qty 5

## 2015-11-20 MED ORDER — LIDOCAINE HCL 1 % IJ SOLN
INTRAMUSCULAR | Status: AC
  Filled 2015-11-20: qty 20

## 2015-11-20 MED ORDER — ALTEPLASE 100 MG IV SOLR
4.0000 mg | Freq: Once | INTRAVENOUS | Status: DC
  Filled 2015-11-20: qty 4

## 2015-11-20 MED ORDER — MIDAZOLAM HCL 2 MG/2ML IJ SOLN
INTRAMUSCULAR | Status: AC
  Filled 2015-11-20: qty 2

## 2015-11-20 MED ORDER — HEPARIN SODIUM (PORCINE) 1000 UNIT/ML IJ SOLN
INTRAMUSCULAR | Status: AC | PRN
  Administered 2015-11-20: 3000 [IU] via INTRAVENOUS

## 2015-11-20 MED ORDER — FENTANYL CITRATE (PF) 100 MCG/2ML IJ SOLN
INTRAMUSCULAR | Status: AC | PRN
  Administered 2015-11-20: 25 ug via INTRAVENOUS

## 2015-11-20 MED ORDER — FENTANYL CITRATE (PF) 100 MCG/2ML IJ SOLN
INTRAMUSCULAR | Status: AC
  Filled 2015-11-20: qty 2

## 2015-11-20 MED ORDER — HEPARIN NICU/PED PF 100 UNITS/ML: INTRAVENOUS | Status: AC | PRN

## 2015-11-20 MED ORDER — IOHEXOL 300 MG/ML  SOLN
100.0000 mL | Freq: Once | INTRAMUSCULAR | Status: AC | PRN
  Administered 2015-11-20: 30 mL via INTRAVENOUS

## 2015-11-20 MED ORDER — HEPARIN SODIUM (PORCINE) 1000 UNIT/ML IJ SOLN
INTRAMUSCULAR | Status: AC
  Filled 2015-11-20: qty 1

## 2015-11-20 MED ORDER — NALOXONE HCL 0.4 MG/ML IJ SOLN
INTRAMUSCULAR | Status: AC
  Filled 2015-11-20: qty 1

## 2015-11-20 MED ORDER — SODIUM CHLORIDE 0.9 % IV SOLN
INTRAVENOUS | Status: AC | PRN
  Administered 2015-11-20: 10 mL/h via INTRAVENOUS
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
## 2015-11-20 NOTE — Progress Notes (Signed)
[name] Progress Note   Subjective: had card arrest in IR today, responded well to Narcan.  Narcotics dc'd. Having invol movements since yesterday.  Chest hurts after CPR.    Filed Vitals:   11/20/15 1610 11/20/15 1615 11/20/15 1624 11/20/15 1635  BP: 86/56 86/56 75/49  74/45  Pulse: 77 73 74 71  Temp:      TempSrc:      Resp: 16 18 19 27   Height:      Weight:      SpO2: 100% 100% 100% 94%    Inpatient medications: . acidophilus  1 capsule Oral Daily  . alteplase  4 mg Intracatheter Once  . aspirin EC  81 mg Oral QHS  . bacitracin   Topical Daily  . calcitRIOL  0.75 mcg Oral Q M,W,F-HD  . cefTAZidime (FORTAZ)  IV  2 g Intravenous Q M,W,F-1800  . cinacalcet  60 mg Oral Q breakfast  . fentaNYL      . flumazenil      . heparin      . hydrocortisone   Rectal BID  . insulin aspart  0-5 Units Subcutaneous QHS  . insulin aspart  0-9 Units Subcutaneous TID WC  . insulin aspart  4 Units Subcutaneous TID WC  . insulin detemir  50 Units Subcutaneous QHS  . lidocaine      . midazolam      . midodrine  20 mg Oral TID WC  . multivitamin  1 tablet Oral QHS  . naloxone      . pantoprazole  40 mg Oral Daily  . sevelamer carbonate  4,000 mg Oral TID WC   . sodium chloride 10 mL/hr at 11/04/2015 1316   sodium chloride, sodium chloride, alteplase, heparin, lactulose, lidocaine (PF), lidocaine-prilocaine, loperamide, ondansetron **OR** ondansetron (ZOFRAN) IV, pentafluoroprop-tetrafluoroeth, senna  Exam: Awake and alert, writhing about almost continuously No jvd Chest clear bilat RRR no mrg Abd soft ntnd very obese no ascites Bilat hands wrapped up No LE edema Neuro nonfocal  CXR last admit 3/13 > clear bilat  Dialysis: [facility] MWF  5h  105kg  2/2.5 bath  Hep 10,000   L thigh AVG Mircera: none Calcitriol: 0.75 ug mwf  Assessment: 1. 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 Cardiac arrest - in IR today, improved with  Narcan 3. ESRD -MWF HD 4. Clotted AVG - appreciate IR assist; attempted declot of AVG, now has new TDC  5. Chronic hypotension on midodrine, ACTH stim test shows normal response 6. AMS - better 7. Volume - below dry wt 8. Involuntary movement - poss due to Neurontin, dc'd yesterday. Started last night per husband  Plan - hold on HD due to events today.  Plan HD first shift in am. Narcotics stopped appropriately. Cont to hold neurontin, hopefully the invol movements will improve. Check CXR.   Rudi Splinter MD [name] pager [phone]    cell [phone] 11/20/2015, 5:39 PM    Recent Labs Lab 11/17/2015 1604  11/17/15 0206 11/18/15 0917 11/18/15 2148 11/06/2015 1345  NA 135  < > 131* 138  --  136  K 3.9  < > 5.2* 4.6  --  4.1  CL 94*  --  94* 96*  --   --   CO2 22  --  16* 21*  --   --   GLUCOSE 217*  < > 325* 223* 138* 91  BUN 38*  --  83* 55*  --   --  CREATININE 4.08*  --  7.17* 5.67*  --   --   CALCIUM 8.2*  --  8.3* 8.2*  --   --   PHOS 3.9  --  5.7* 3.4  --   --   < > = values in this interval not displayed.  Recent Labs Lab 11/18/2015 1604 11/17/15 0206 11/18/15 0917  ALBUMIN 2.1* 1.8* 1.7*    Recent Labs Lab 11/24/2015 1604 11/29/2015 0836 11/11/2015 1345  WBC 12.6*  --   --   HGB 9.9* 11.9* 11.6*  HCT 36.1 35.0* 34.0*  MCV 92.6  --   --   PLT 228  --   --

## 2015-11-20 NOTE — Sedation Documentation (Signed)
Vital signs stable. 

## 2015-11-20 NOTE — Progress Notes (Signed)
Patient received to Hemodialysis. AVG  Assessed before cannulation. There is no bruit and no thrill present along the graft. This has been also verified by  Samuel Bouche RN

## 2015-11-20 NOTE — Sedation Documentation (Signed)
Pt became apneic and bradycardic. MD at bedside, code blue activated.

## 2015-11-20 NOTE — Sedation Documentation (Signed)
Patient is resting comfortably. 

## 2015-11-20 NOTE — Sedation Documentation (Signed)
Patient is resting comfortably. Vitals stable. 

## 2015-11-20 NOTE — Progress Notes (Signed)
Transported pt back to 3S14. Additional report given at bedside to [place], [place]. Pt alert and oriented. Vital signs at baseline. Husband and RRT RN at bedside.

## 2015-11-20 NOTE — Sedation Documentation (Signed)
CPR in progress. 

## 2015-11-20 NOTE — Sedation Documentation (Signed)
Family updated as to patient's status.

## 2015-11-20 NOTE — Sedation Documentation (Signed)
Pt states he right arm hurts. Pt is moving around a lot on table, encouraged pt to try to be still, pt states she cannot ly still.

## 2015-11-20 NOTE — Sedation Documentation (Addendum)
Patient is resting comfortably. Vitals stable. 

## 2015-11-20 NOTE — Progress Notes (Signed)
   11/20/15 1615  Clinical Encounter Type  Visited With Patient not available;Health care provider  Visit Type Initial;Patient in surgery;Code  Referral From Nurse   Chaplain responded to a Code Blue in IR. Patient is not available, and according to the patient's RN on 3S, family is not present at this time. Chaplain support available as needed.   Jeri Lager, Chaplain 11/20/2015 4:16 PM

## 2015-11-20 NOTE — Sedation Documentation (Addendum)
Pt lethargic, pulse 74, palpable. Code team at bedside

## 2015-11-20 NOTE — Sedation Documentation (Signed)
Patient is resting comfortably. Vitals stable. Code team at bedside

## 2015-11-20 NOTE — Sedation Documentation (Signed)
Patient vitals stable at this time.

## 2015-11-20 NOTE — Sedation Documentation (Signed)
Narcan 0.4 mg IV given

## 2015-11-20 NOTE — Procedures (Signed)
L thigh graft declot, attempted R IJ Trialysis 20 HD cathater placement COmplication: apnea, improved after reversal of sedation meds   No blood loss. See complete dictation in [facility].

## 2015-11-20 NOTE — Progress Notes (Signed)
[facility] PROGRESS NOTE  CHANELL HEIST [id number] DOB: 1965/11/23 DOA: 11/10/2015 PCP: Arnette Norris, MD  Brief narrative 50 year old female with history of chronic hypotension on midodrine, uncontrolled diabetes with peripheral vascular disease, end-stage renal disease on dialysis (M, W, F), history of staph aureus bacteremia with sepsis 2008, osteomyelitis of multiple digits of her hands and feet status post amputation, GERD, who is wheelchair-bound presented to the ED with fever and infection of her right middle finger. She reported having of fever of 101F at home. In the ED she had a temperature of 99.8 Fahrenheit, hypotensive with blood pressure 73/40 mmHg, normal heart rate and normal O2 sat. She was found to have necrosis of multiple fingertips of both her hands. Lactic acid of 5.74. She received 1 L IV normal saline bolus and IV vancomycin. Patient admitted to hospitalist service on stepdown. Seen by hand surgeon and underwent amputation of left thumb, left index finger, right small finger, right ring finger and right hand incision and drainage of mid palmar space with radical flexor tenosynovectomy, flexor tendon excision and open carpal tunnel release. Also had excisional debridement of partial-thickness skin of right dorsal hand and volar wrist wounds.  Assessment/Plan: Sepsis secondary to multiple infected fingers Taken to OR with findings as above. Continue IV antibiotics as recommended by ID Tressie Ellis) - Pt undergoing serial debridements but may require RUE amputation should condition worsen.  Hypotension Chronic and is on midodrine   Vulvovaginal candidiasis - s/p Diflucan x 1 time   Hemorrhoids: new problem - Continue Anusol - Pain control has been a problem as patient has chronic low blood pressures and is unable to take acetaminophen - Unable to use Ibuprofen as patient reportedly has clot at her graft  End-stage renal disease on dialysis Renal following and  managing.   Uncontrolled mellitus Continue Lantus with SSI  Peripheral vascular disease Continue aspirin  DVT prophylaxis: subcutaneous heparin  Diet: Renal/diabetic  Code Status: Full code Family Communication: None at bedside Disposition Plan: Continue step down monitoring secondary to hypotension in context of active infection.   Consultants:  Nephrology  Hand surgery  Procedures:  Patient of multiple fingers, right hand I&D on 3/13  Antibiotics:   IV vancomycin since 3/13  HPI/Subjective: Patient reports that her graft has clotted. Husband states she was restless overnight.  Objective: Filed Vitals:   11/20/15 0835 11/20/15 0839  BP: 109/96   Pulse: 66   Temp:  98.2 F (36.8 C)  Resp: 21     Intake/Output Summary (Last 24 hours) at 11/20/15 1004 Last data filed at 11/20/15 0800  Gross per 24 hour  Intake    965 ml  Output    140 ml  Net    825 ml   Filed Weights   11/22/2015 0455 11/20/15 0500 11/20/15 0718  Weight: 102 kg (224 lb 13.9 oz) 111 kg (244 lb 11.4 oz) 101 kg (222 lb 10.6 oz)    Exam:   General:  Middle aged female, awake and alert  HEENT: Moist mucosa, supple neck  Chest: Clear bilaterally, no wheezes  CVS: Normal S1-S2, no murmurs or gallop  GI: Soft, nondistended, nontender, bowel sounds present  Musculoskeletal: Dressing over right hand intact, dressing over left thumb and index finger intact  CNS: Alert and oriented   Data Reviewed: Basic Metabolic Panel:  Recent Labs Lab 11/21/2015 1604 11/24/2015 0836 11/17/15 0206 11/18/15 0917 11/18/15 2148 11/24/2015 1345  NA 135 131* 131* 138  --  136  K 3.9 4.8  5.2* 4.6  --  4.1  CL 94*  --  94* 96*  --   --   CO2 22  --  16* 21*  --   --   GLUCOSE 217* 215* 325* 223* 138* 91  BUN 38*  --  83* 55*  --   --   CREATININE 4.08*  --  7.17* 5.67*  --   --   CALCIUM 8.2*  --  8.3* 8.2*  --   --   PHOS 3.9  --  5.7* 3.4  --   --    Liver Function Tests:  Recent Labs Lab  11/13/2015 1604 11/17/15 0206 11/18/15 0917  ALBUMIN 2.1* 1.8* 1.7*   No results for input(s): LIPASE, AMYLASE in the last 168 hours. No results for input(s): AMMONIA in the last 168 hours. CBC:  Recent Labs Lab 11/15/2015 1327 11/12/2015 1604 11/15/2015 0836 11/17/2015 1345  WBC  --  12.6*  --   --   HGB 10.9* 9.9* 11.9* 11.6*  HCT 32.0* 36.1 35.0* 34.0*  MCV  --  92.6  --   --   PLT  --  228  --   --    Cardiac Enzymes: No results for input(s): CKTOTAL, CKMB, CKMBINDEX, TROPONINI in the last 168 hours. BNP (last 3 results) No results for input(s): BNP in the last 8760 hours.  ProBNP (last 3 results) No results for input(s): PROBNP in the last 8760 hours.  CBG:  Recent Labs Lab 11/15/2015 1229 11/27/2015 1652 11/04/2015 1820 11/16/2015 2121 11/20/15 0836  GLUCAP 94 79 105* 105* 133*    Recent Results (from the past 240 hour(s))  Culture, blood (routine x 2)     Status: None   Collection Time: 11/18/2015 11:02 AM  Result Value Ref Range Status   Specimen Description BLOOD RIGHT ANTECUBITAL  Final   Special Requests BOTTLES DRAWN AEROBIC AND ANAEROBIC 5CC  Final   Culture NO GROWTH 5 DAYS  Final   Report Status 11/28/2015 FINAL  Final  Culture, blood (routine x 2)     Status: None   Collection Time: 11/05/2015 11:11 AM  Result Value Ref Range Status   Specimen Description BLOOD BLOOD RIGHT FOREARM  Final   Special Requests BOTTLES DRAWN AEROBIC AND ANAEROBIC 5CC  Final   Culture NO GROWTH 5 DAYS  Final   Report Status 11/25/2015 FINAL  Final  Culture, routine-abscess     Status: None   Collection Time: 11/21/2015  9:04 PM  Result Value Ref Range Status   Specimen Description ABSCESS  Final   Special Requests   Final    LEFT HAND PALMAR ABSCESS OVER MEDIAL METACARPALS PATIENT ON FOLLOWING VANCOMYCIN   Gram Stain   Final    FEW WBC PRESENT,BOTH PMN AND MONONUCLEAR NO SQUAMOUS EPITHELIAL CELLS SEEN FEW GRAM POSITIVE COCCI IN PAIRS FEW GRAM POSITIVE RODS RARE GRAM NEGATIVE  RODS Performed at [facility]    Culture   Final    MULTIPLE ORGANISMS PRESENT, NONE PREDOMINANT Note: NO STAPHYLOCOCCUS AUREUS ISOLATED NO GROUP A STREP (S.PYOGENES) ISOLATED Performed at [facility]    Report Status 11/21/2015 FINAL  Final  Anaerobic culture     Status: None   Collection Time: 11/01/2015  9:05 PM  Result Value Ref Range Status   Specimen Description ABSCESS  Final   Special Requests   Final    LEFT HAND PALMAR ABSCESS OVER MEDIAL METACARPALS PATIENT ON FOLLOWING VANCOMYCIN   Gram Stain   Final  FEW WBC PRESENT,BOTH PMN AND MONONUCLEAR NO SQUAMOUS EPITHELIAL CELLS SEEN MODERATE GRAM POSITIVE COCCI IN PAIRS FEW GRAM POSITIVE RODS RARE GRAM NEGATIVE RODS Performed at [facility]    Culture   Final    NO ANAEROBES ISOLATED Performed at [facility]    Report Status 10/30/2015 FINAL  Final  MRSA PCR Screening     Status: None   Collection Time: 11/12/15  2:15 AM  Result Value Ref Range Status   MRSA by PCR NEGATIVE NEGATIVE Final    Comment:        The GeneXpert MRSA Assay (FDA approved for NASAL specimens only), is one component of a comprehensive MRSA colonization surveillance program. It is not intended to diagnose MRSA infection nor to guide or monitor treatment for MRSA infections.      Studies: No results found.  Scheduled Meds: . acidophilus  1 capsule Oral Daily  . aspirin EC  81 mg Oral QHS  . bacitracin   Topical Daily  . calcitRIOL  0.75 mcg Oral Q M,W,F-HD  . cefTAZidime (FORTAZ)  IV  2 g Intravenous Q M,W,F-1800  . cinacalcet  60 mg Oral Q breakfast  . hydrocortisone   Rectal BID  . insulin aspart  0-5 Units Subcutaneous QHS  . insulin aspart  0-9 Units Subcutaneous TID WC  . insulin aspart  4 Units Subcutaneous TID WC  . insulin detemir  50 Units Subcutaneous QHS  . midodrine  20 mg Oral TID WC  . multivitamin  1 tablet Oral QHS  . pantoprazole  40 mg Oral Daily  . sevelamer carbonate  4,000  mg Oral TID WC   Continuous Infusions: . sodium chloride 10 mL/hr at 11/13/2015 1316    Time spent:35 minutes  Velvet Bathe  [facility] Pager [phone] If 7PM-7AM, please contact night-coverage at [email or web address], password [facility] 11/20/2015, 10:04 AM  LOS: 9 days

## 2015-11-20 NOTE — Sedation Documentation (Signed)
astystole

## 2015-11-21 DIAGNOSIS — K6289 Other specified diseases of anus and rectum: Secondary | ICD-10-CM

## 2015-11-21 DIAGNOSIS — Z515 Encounter for palliative care: Secondary | ICD-10-CM | POA: Insufficient documentation

## 2015-11-21 DIAGNOSIS — M79641 Pain in right hand: Secondary | ICD-10-CM | POA: Insufficient documentation

## 2015-11-21 DIAGNOSIS — K625 Hemorrhage of anus and rectum: Secondary | ICD-10-CM

## 2015-11-21 LAB — BASIC METABOLIC PANEL
Anion gap: 15 (ref 5–15)
BUN: 45 mg/dL — AB (ref 6–20)
CALCIUM: 7.9 mg/dL — AB (ref 8.9–10.3)
CHLORIDE: 99 mmol/L — AB (ref 101–111)
CO2: 25 mmol/L (ref 22–32)
CREATININE: 5.51 mg/dL — AB (ref 0.44–1.00)
GFR calc Af Amer: 10 mL/min — ABNORMAL LOW (ref >60)
GFR, EST NON AFRICAN AMERICAN: 8 mL/min — AB (ref >60)
Glucose, Bld: 70 mg/dL (ref 65–99)
POTASSIUM: 4.3 mmol/L (ref 3.5–5.1)
Sodium: 139 mmol/L (ref 135–145)

## 2015-11-21 LAB — GLUCOSE, CAPILLARY
GLUCOSE-CAPILLARY: 77 mg/dL (ref 65–99)
GLUCOSE-CAPILLARY: 88 mg/dL (ref 65–99)
Glucose-Capillary: 59 mg/dL — ABNORMAL LOW (ref 65–99)
Glucose-Capillary: 77 mg/dL (ref 65–99)
Glucose-Capillary: 67 mg/dL (ref 65–99)
Glucose-Capillary: 115 mg/dL — ABNORMAL HIGH (ref 65–99)
Glucose-Capillary: 136 mg/dL — ABNORMAL HIGH (ref 65–99)
Glucose-Capillary: 111 mg/dL — ABNORMAL HIGH (ref 65–99)

## 2015-11-21 LAB — CBC
HEMATOCRIT: 28.7 % — AB (ref 36.0–46.0)
HEMOGLOBIN: 8.3 g/dL — AB (ref 12.0–15.0)
MCH: 26.5 pg (ref 26.0–34.0)
MCHC: 28.9 g/dL — AB (ref 30.0–36.0)
MCV: 91.7 fL (ref 78.0–100.0)
Platelets: 246 10*3/uL (ref 150–400)
RBC: 3.13 MIL/uL — AB (ref 3.87–5.11)
RDW: 19.3 % — ABNORMAL HIGH (ref 11.5–15.5)
WBC: 23.7 10*3/uL — ABNORMAL HIGH (ref 4.0–10.5)

## 2015-11-21 MED ORDER — DEXTROSE 50 % IV SOLN
INTRAVENOUS | Status: AC
  Administered 2015-11-21: 25 mL via INTRAVENOUS
  Filled 2015-11-21: qty 50

## 2015-11-21 MED ORDER — MIDODRINE HCL 5 MG PO TABS
ORAL_TABLET | ORAL | Status: AC
Start: 2015-11-21 — End: 2015-11-21
  Filled 2015-11-21: qty 4

## 2015-11-21 MED ORDER — HYDROCORTISONE ACE-PRAMOXINE 1-1 % RE FOAM
1.0000 | Freq: Two times a day (BID) | RECTAL | Status: DC
  Administered 2015-11-21: 1 via RECTAL
  Filled 2015-11-21: qty 10

## 2015-11-21 MED ORDER — DEXTROSE 50 % IV SOLN
25.0000 mL | Freq: Once | INTRAVENOUS | Status: AC
  Administered 2015-11-21 (×2): 25 mL via INTRAVENOUS

## 2015-11-21 MED ORDER — LIDOCAINE 5 % EX PTCH
1.0000 | MEDICATED_PATCH | CUTANEOUS | Status: DC
  Administered 2015-11-21: 1 via TRANSDERMAL
  Filled 2015-11-21 (×2): qty 1

## 2015-11-21 MED ORDER — SENNOSIDES-DOCUSATE SODIUM 8.6-50 MG PO TABS
2.0000 | ORAL_TABLET | Freq: Every day | ORAL | Status: DC
  Administered 2015-11-21: 2 via ORAL
  Filled 2015-11-21: qty 2

## 2015-11-21 MED ORDER — INSULIN DETEMIR 100 UNIT/ML ~~LOC~~ SOLN
20.0000 [IU] | Freq: Every day | SUBCUTANEOUS | Status: DC
  Administered 2015-11-21 – 2015-11-24 (×2): 20 [IU] via SUBCUTANEOUS
  Filled 2015-11-21 (×6): qty 0.2

## 2015-11-21 MED ORDER — POLYETHYLENE GLYCOL 3350 17 G PO PACK
17.0000 g | PACK | Freq: Two times a day (BID) | ORAL | Status: DC
  Administered 2015-11-21 – 2015-11-24 (×6): 17 g via ORAL
  Filled 2015-11-21 (×6): qty 1

## 2015-11-21 MED ORDER — DEXTROSE 5 % IV SOLN
1.0000 g | Freq: Once | INTRAVENOUS | Status: AC
  Administered 2015-11-21: 1 g via INTRAVENOUS
  Filled 2015-11-21: qty 1

## 2015-11-21 NOTE — Progress Notes (Signed)
While turning patient, brown formed stool with bright red blood noted. Pt states her bottom is very sore, cleaning caused much distress to the patient.  MD notified. Will continue to monitor.

## 2015-11-21 NOTE — Care Management Note (Signed)
Case Management Note  Patient Details  Name: [name] MRN: [id number] Date of Birth: [birth year]  Subjective/Objective:                    Action/Plan:   Expected Discharge Date:                  Expected Discharge Plan:  [place]  In-House Referral:     Discharge planning Services  CM Consult  Post Acute Care Choice:    Choice offered to:     DME Arranged:    DME Agency:     HH Arranged:    HH Agency:     Status of Service:  In process, will continue to follow  Medicare Important Message Given:  Yes Date Medicare IM Given:    Medicare IM give by:    Date Additional Medicare IM Given:    Additional Medicare Important Message give by:     If discussed at [place] of Stay Meetings, dates discussed:  11/21/2015  Additional Comments:  Delrae Sawyers, RN 11/21/2015, 10:31 AM

## 2015-11-21 NOTE — Progress Notes (Signed)
[facility] PROGRESS NOTE  [name] [id number] DOB: 07-Jun-1966 DOA: 11/15/2015 PCP: Arnette Norris, MD  Brief narrative 50 year old female with history of chronic hypotension on midodrine, uncontrolled diabetes with peripheral vascular disease, end-stage renal disease on dialysis (M, W, F), history of staph aureus bacteremia with sepsis 2008, osteomyelitis of multiple digits of her hands and feet status post amputation, GERD, who is wheelchair-bound presented to the ED with fever and infection of her right middle finger. She reported having of fever of 101F at home. In the ED she had a temperature of 99.8 Fahrenheit, hypotensive with blood pressure 73/40 mmHg, normal heart rate and normal O2 sat. She was found to have necrosis of multiple fingertips of both her hands. Lactic acid of 5.74. She received 1 L IV normal saline bolus and IV vancomycin. Patient admitted to hospitalist service on stepdown. Seen by hand surgeon and underwent amputation of left thumb, left index finger, right small finger, right ring finger and right hand incision and drainage of mid palmar space with radical flexor tenosynovectomy, flexor tendon excision and open carpal tunnel release. Also had excisional debridement of partial-thickness skin of right dorsal hand and volar wrist wounds.  Assessment/Plan: Sepsis secondary to multiple infected fingers Taken to OR with findings as above. Continue IV antibiotics as recommended by ID Tressie Ellis) - Pt undergoing serial debridements but may require RUE amputation should condition worsen. - please see discussion below on pain management.  Hypotension Chronic and is on midodrine   Vulvovaginal candidiasis - s/p Diflucan x 1 time   Hemorrhoids: new problem - Continue Anusol - Pain Management has been complicated as patient reports worsening hypotension with acetaminophen, She recently had a clot as such ibuprofen not an option, and she has chronic hypotension making  opiod use complicated. As such will consult palliative care medical team to help assist with symptom management.  End-stage renal disease on dialysis Renal following and managing.   Uncontrolled mellitus Continue Lantus with SSI  Peripheral vascular disease Continue aspirin  DVT prophylaxis: subcutaneous heparin  Diet: Renal/diabetic  Code Status: Full code Family Communication: None at bedside Disposition Plan: Continue step down monitoring secondary to hypotension in context of active infection.   Consultants:  Nephrology  Hand surgery  Procedures:  Patient of multiple fingers, right hand I&D on 3/13  Antibiotics:   IV vancomycin since 3/13  HPI/Subjective: Patient reports discomfort originating from her anus she attributes to hemorrhoids  Objective: Filed Vitals:   11/21/15 1030 11/21/15 1100  BP: 80/37 83/27  Pulse: 76 75  Temp:    Resp:      Intake/Output Summary (Last 24 hours) at 11/21/15 1109 Last data filed at 11/20/15 1844  Gross per 24 hour  Intake     50 ml  Output      0 ml  Net     50 ml   Filed Weights   11/20/15 0718 11/21/15 0329 11/21/15 0700  Weight: 101 kg (222 lb 10.6 oz) 111 kg (244 lb 11.4 oz) 110 kg (242 lb 8.1 oz)    Exam:   General:  Middle aged female, awake and alert  HEENT: Moist mucosa, supple neck  Chest: Clear bilaterally, no wheezes  CVS: Normal S1-S2, no murmurs or gallop  GI: Soft, nondistended, nontender, bowel sounds present  Musculoskeletal: Dressing over right hand intact, dressing over left thumb and index finger intact  CNS: Alert and awake, moves upper extremities equally   Data Reviewed: Basic Metabolic Panel:  Recent Labs Lab 11/04/2015  1604  11/17/15 0206 11/18/15 0917 11/18/15 2148 11/16/2015 1345 11/20/15 1725 11/21/15 0722  NA 135  < > 131* 138  --  136 138 139  K 3.9  < > 5.2* 4.6  --  4.1 4.1 4.3  CL 94*  --  94* 96*  --   --  97* 99*  CO2 22  --  16* 21*  --   --  24 25  GLUCOSE  217*  < > 325* 223* 138* 91 84 70  BUN 38*  --  83* 55*  --   --  40* 45*  CREATININE 4.08*  --  7.17* 5.67*  --   --  5.17* 5.51*  CALCIUM 8.2*  --  8.3* 8.2*  --   --  8.0* 7.9*  PHOS 3.9  --  5.7* 3.4  --   --  6.0*  --   < > = values in this interval not displayed. Liver Function Tests:  Recent Labs Lab 11/11/2015 1604 11/17/15 0206 11/18/15 0917 11/20/15 1725  ALBUMIN 2.1* 1.8* 1.7* 1.7*   No results for input(s): LIPASE, AMYLASE in the last 168 hours. No results for input(s): AMMONIA in the last 168 hours. CBC:  Recent Labs Lab 11/04/2015 1604 11/24/2015 0836 11/20/2015 1345 11/20/15 1745 11/21/15 0722  WBC 12.6*  --   --   --  23.7*  HGB 9.9* 11.9* 11.6* 8.0* 8.3*  HCT 36.1 35.0* 34.0* 29.5* 28.7*  MCV 92.6  --   --   --  91.7  PLT 228  --   --   --  246   Cardiac Enzymes: No results for input(s): CKTOTAL, CKMB, CKMBINDEX, TROPONINI in the last 168 hours. BNP (last 3 results) No results for input(s): BNP in the last 8760 hours.  ProBNP (last 3 results) No results for input(s): PROBNP in the last 8760 hours.  CBG:  Recent Labs Lab 11/20/15 1842 11/20/15 2124 11/21/15 0949 11/21/15 1008 11/21/15 1043  GLUCAP 66 73 59* 77 77    Recent Results (from the past 240 hour(s))  Culture, blood (routine x 2)     Status: None   Collection Time: 11/09/2015 11:11 AM  Result Value Ref Range Status   Specimen Description BLOOD BLOOD RIGHT FOREARM  Final   Special Requests BOTTLES DRAWN AEROBIC AND ANAEROBIC 5CC  Final   Culture NO GROWTH 5 DAYS  Final   Report Status 11/01/2015 FINAL  Final  Culture, routine-abscess     Status: None   Collection Time: 11/13/2015  9:04 PM  Result Value Ref Range Status   Specimen Description ABSCESS  Final   Special Requests   Final    LEFT HAND PALMAR ABSCESS OVER MEDIAL METACARPALS PATIENT ON FOLLOWING VANCOMYCIN   Gram Stain   Final    FEW WBC PRESENT,BOTH PMN AND MONONUCLEAR NO SQUAMOUS EPITHELIAL CELLS SEEN FEW GRAM POSITIVE  COCCI IN PAIRS FEW GRAM POSITIVE RODS RARE GRAM NEGATIVE RODS Performed at [facility]    Culture   Final    MULTIPLE ORGANISMS PRESENT, NONE PREDOMINANT Note: NO STAPHYLOCOCCUS AUREUS ISOLATED NO GROUP A STREP (S.PYOGENES) ISOLATED Performed at [facility]    Report Status 11/29/2015 FINAL  Final  Anaerobic culture     Status: None   Collection Time: 11/05/2015  9:05 PM  Result Value Ref Range Status   Specimen Description ABSCESS  Final   Special Requests   Final    LEFT HAND PALMAR ABSCESS OVER MEDIAL METACARPALS PATIENT ON FOLLOWING  VANCOMYCIN   Gram Stain   Final    FEW WBC PRESENT,BOTH PMN AND MONONUCLEAR NO SQUAMOUS EPITHELIAL CELLS SEEN MODERATE GRAM POSITIVE COCCI IN PAIRS FEW GRAM POSITIVE RODS RARE GRAM NEGATIVE RODS Performed at [facility]    Culture   Final    NO ANAEROBES ISOLATED Performed at [facility]    Report Status 11/27/2015 FINAL  Final  MRSA PCR Screening     Status: None   Collection Time: 11/12/15  2:15 AM  Result Value Ref Range Status   MRSA by PCR NEGATIVE NEGATIVE Final    Comment:        The GeneXpert MRSA Assay (FDA approved for NASAL specimens only), is one component of a comprehensive MRSA colonization surveillance program. It is not intended to diagnose MRSA infection nor to guide or monitor treatment for MRSA infections.      Studies: Ir Fluoro Guide Cv Line Right  11/20/2015  CLINICAL DATA:  Occluded dialysis graft. End-stage renal disease, needs access for hemodialysis. COMPARISON:  None EXAM: EXAM DIALYSIS GRAFT DECLOT (ATTEMPTED) VENOUS ANGIOPLASTY ULTRASOUND GUIDANCE FOR VASCULAR ACCESS X2 NON TUNNELED CENTRAL VENOUS CATHETER PLACEMENT UNDER FLUOROSCOPY TECHNIQUE: The procedure, risks (including but not limited to bleeding, infection, organ damage ), benefits, and alternatives were explained to the patient and spouse. Questions regarding the procedure were encouraged and answered. The spouse  understands and consents to the procedure. Intravenous Fentanyl and Versed were administered as conscious sedation during continuous monitoring of the patient's level of consciousness and physiological / cardiorespiratory status by the radiology RN, with a total moderate sedation time of 12 minutes. The arterial limb of the graft was accessed antegrade with a 21-gauge micropuncture needle under real-time ultrasonic guidance after the overlying skin prepped with Betadine, draped in usual sterile fashion, infiltrated locally with 1% lidocaine. Needle exchanged over 018 guidewire for transitional dilator through which 4 mg t-PA was administered. Ultrasound imaging documentation was saved. Through the dilator, a Bentson wire was advanced to the venous anastomosis. Over this a 10F sheath was placed, through which a 5 [place] Kumpe catheter was advanced for outflow venography. This showed patency of the outflow venous system through the IVC. 3000 units heparin were administered. The Arrow PTD device was used to macerate thrombus in the graft with 2 passes. Gentle hand injections showed residual stenosis in the arterial limb near the apex of the graft. This was dilated using a 7 mm x 4 cm Conquest angioplasty balloon with good response. Antegrade contrast flow was noted with gentle hand injection. Attention was then turned to the venous limb for retrograde access. Patient became apneic with low heart rate, no palpable pulse. CPR was initiated. Sedation reversal agents were administered. Patient regained spontaneous breathing and palpable pulse. Blood pressure regained preprocedure value. The declot procedure was terminated, the sheath removed and hemostasis achieved there with a 3-0 Monocryl suture. The patient's spouse was updated with the events. Attention was turned to placing a temporary dialysis catheter. Patency of the right IJ vein was confirmed with ultrasound with image documentation. An appropriate skin site was  determined. Skin site was marked. Region was prepped using maximum barrier technique including cap and mask, sterile gown, sterile gloves, large sterile sheet, and Chlorhexidine as cutaneous antisepsis. The region was infiltrated locally with 1% lidocaine. Under real-time ultrasound guidance, the right IJ vein was accessed with a 19 gauge needle; the needle tip within the vein was confirmed with ultrasound image documentation. The needle exchanged over a guidewire for  vascular dilator which allowed advancement of a 20 cm Trialysis catheter. This was positioned with the tip at the cavoatrial junction. Spot chest radiograph shows good positioning and no pneumothorax. Catheter was flushed and sutured externally with 0-Prolene sutures. Patient tolerated the procedure well. FLUOROSCOPY TIME:  1.9 minutes, XX123456 uGym2 DAP COMPLICATIONS: COMPLICATIONS Apnea, requiring reversal of sedation agents. SIR level B: Nominal therapy (including overnight admission for observation), no consequence. IMPRESSION: 1. Incomplete declot of left thigh synthetic hemodialysis graft, due to sedation related complication. She can return for completion of this procedure electively. 2. Technically successful balloon angioplasty of intragraft stenosis. 3. Technically successful right IJ 20 cm trialysis hemodialysis catheter placement. ACCESS: Remains approachable for percutaneous intervention as needed. Electronically Signed   By: Lucrezia Europe M.D.   On: 11/20/2015 17:22   Ir US Guide Vasc Access Left  11/20/2015  CLINICAL DATA:  Occluded dialysis graft. End-stage renal disease, needs access for hemodialysis. COMPARISON:  None EXAM: EXAM DIALYSIS GRAFT DECLOT (ATTEMPTED) VENOUS ANGIOPLASTY ULTRASOUND GUIDANCE FOR VASCULAR ACCESS X2 NON TUNNELED CENTRAL VENOUS CATHETER PLACEMENT UNDER FLUOROSCOPY TECHNIQUE: The procedure, risks (including but not limited to bleeding, infection, organ damage ), benefits, and alternatives were explained to the  patient and spouse. Questions regarding the procedure were encouraged and answered. The spouse understands and consents to the procedure. Intravenous Fentanyl and Versed were administered as conscious sedation during continuous monitoring of the patient's level of consciousness and physiological / cardiorespiratory status by the radiology RN, with a total moderate sedation time of 12 minutes. The arterial limb of the graft was accessed antegrade with a 21-gauge micropuncture needle under real-time ultrasonic guidance after the overlying skin prepped with Betadine, draped in usual sterile fashion, infiltrated locally with 1% lidocaine. Needle exchanged over 018 guidewire for transitional dilator through which 4 mg t-PA was administered. Ultrasound imaging documentation was saved. Through the dilator, a Bentson wire was advanced to the venous anastomosis. Over this a 56F sheath was placed, through which a 5 [place] Kumpe catheter was advanced for outflow venography. This showed patency of the outflow venous system through the IVC. 3000 units heparin were administered. The Arrow PTD device was used to macerate thrombus in the graft with 2 passes. Gentle hand injections showed residual stenosis in the arterial limb near the apex of the graft. This was dilated using a 7 mm x 4 cm Conquest angioplasty balloon with good response. Antegrade contrast flow was noted with gentle hand injection. Attention was then turned to the venous limb for retrograde access. Patient became apneic with low heart rate, no palpable pulse. CPR was initiated. Sedation reversal agents were administered. Patient regained spontaneous breathing and palpable pulse. Blood pressure regained preprocedure value. The declot procedure was terminated, the sheath removed and hemostasis achieved there with a 3-0 Monocryl suture. The patient's spouse was updated with the events. Attention was turned to placing a temporary dialysis catheter. Patency of the right  IJ vein was confirmed with ultrasound with image documentation. An appropriate skin site was determined. Skin site was marked. Region was prepped using maximum barrier technique including cap and mask, sterile gown, sterile gloves, large sterile sheet, and Chlorhexidine as cutaneous antisepsis. The region was infiltrated locally with 1% lidocaine. Under real-time ultrasound guidance, the right IJ vein was accessed with a 19 gauge needle; the needle tip within the vein was confirmed with ultrasound image documentation. The needle exchanged over a guidewire for vascular dilator which allowed advancement of a 20 cm Trialysis catheter. This was positioned with  the tip at the cavoatrial junction. Spot chest radiograph shows good positioning and no pneumothorax. Catheter was flushed and sutured externally with 0-Prolene sutures. Patient tolerated the procedure well. FLUOROSCOPY TIME:  1.9 minutes, [id number] uGym2 DAP COMPLICATIONS: COMPLICATIONS Apnea, requiring reversal of sedation agents. SIR level B: Nominal therapy (including overnight admission for observation), no consequence. IMPRESSION: 1. Incomplete declot of left thigh synthetic hemodialysis graft, due to sedation related complication. She can return for completion of this procedure electively. 2. Technically successful balloon angioplasty of intragraft stenosis. 3. Technically successful right IJ 20 cm trialysis hemodialysis catheter placement. ACCESS: Remains approachable for percutaneous intervention as needed. Electronically Signed   By: Lucrezia Europe M.D.   On: 11/20/2015 17:22   Ir US Guide Vasc Access Right  11/20/2015  CLINICAL DATA:  Occluded dialysis graft. End-stage renal disease, needs access for hemodialysis. COMPARISON:  None EXAM: EXAM DIALYSIS GRAFT DECLOT (ATTEMPTED) VENOUS ANGIOPLASTY ULTRASOUND GUIDANCE FOR VASCULAR ACCESS X2 NON TUNNELED CENTRAL VENOUS CATHETER PLACEMENT UNDER FLUOROSCOPY TECHNIQUE: The procedure, risks (including but not limited  to bleeding, infection, organ damage ), benefits, and alternatives were explained to the patient and spouse. Questions regarding the procedure were encouraged and answered. The spouse understands and consents to the procedure. Intravenous Fentanyl and Versed were administered as conscious sedation during continuous monitoring of the patient's level of consciousness and physiological / cardiorespiratory status by the radiology RN, with a total moderate sedation time of 12 minutes. The arterial limb of the graft was accessed antegrade with a 21-gauge micropuncture needle under real-time ultrasonic guidance after the overlying skin prepped with Betadine, draped in usual sterile fashion, infiltrated locally with 1% lidocaine. Needle exchanged over 018 guidewire for transitional dilator through which 4 mg t-PA was administered. Ultrasound imaging documentation was saved. Through the dilator, a Bentson wire was advanced to the venous anastomosis. Over this a 37F sheath was placed, through which a 5 [place] Kumpe catheter was advanced for outflow venography. This showed patency of the outflow venous system through the IVC. 3000 units heparin were administered. The Arrow PTD device was used to macerate thrombus in the graft with 2 passes. Gentle hand injections showed residual stenosis in the arterial limb near the apex of the graft. This was dilated using a 7 mm x 4 cm Conquest angioplasty balloon with good response. Antegrade contrast flow was noted with gentle hand injection. Attention was then turned to the venous limb for retrograde access. Patient became apneic with low heart rate, no palpable pulse. CPR was initiated. Sedation reversal agents were administered. Patient regained spontaneous breathing and palpable pulse. Blood pressure regained preprocedure value. The declot procedure was terminated, the sheath removed and hemostasis achieved there with a 3-0 Monocryl suture. The patient's spouse was updated with the  events. Attention was turned to placing a temporary dialysis catheter. Patency of the right IJ vein was confirmed with ultrasound with image documentation. An appropriate skin site was determined. Skin site was marked. Region was prepped using maximum barrier technique including cap and mask, sterile gown, sterile gloves, large sterile sheet, and Chlorhexidine as cutaneous antisepsis. The region was infiltrated locally with 1% lidocaine. Under real-time ultrasound guidance, the right IJ vein was accessed with a 19 gauge needle; the needle tip within the vein was confirmed with ultrasound image documentation. The needle exchanged over a guidewire for vascular dilator which allowed advancement of a 20 cm Trialysis catheter. This was positioned with the tip at the cavoatrial junction. Spot chest radiograph shows good positioning and no pneumothorax.  Catheter was flushed and sutured externally with 0-Prolene sutures. Patient tolerated the procedure well. FLUOROSCOPY TIME:  1.9 minutes, [id number] uGym2 DAP COMPLICATIONS: COMPLICATIONS Apnea, requiring reversal of sedation agents. SIR level B: Nominal therapy (including overnight admission for observation), no consequence. IMPRESSION: 1. Incomplete declot of left thigh synthetic hemodialysis graft, due to sedation related complication. She can return for completion of this procedure electively. 2. Technically successful balloon angioplasty of intragraft stenosis. 3. Technically successful right IJ 20 cm trialysis hemodialysis catheter placement. ACCESS: Remains approachable for percutaneous intervention as needed. Electronically Signed   By: Lucrezia Europe M.D.   On: 11/20/2015 17:22   Dg Chest Port 1 View  11/20/2015  CLINICAL DATA:  Status post hemodialysis catheter placement EXAM: PORTABLE CHEST 1 VIEW COMPARISON:  11/09/2015 FINDINGS: There is a dual-lumen right-sided hemodialysis catheter with the tip projecting over the cavoatrial junction. There is no focal parenchymal  opacity. There is no pleural effusion or pneumothorax. The heart and mediastinal contours are unremarkable. The osseous structures are unremarkable. IMPRESSION: Dual-lumen right-sided hemodialysis catheter with the tip projecting over the cavoatrial junction. Electronically Signed   By: Kathreen Devoid   On: 11/20/2015 18:45   Ir Thrombectomy Av Fistula W/thrombolysis/pta/stent Inc Shunt Img Lt  11/20/2015  CLINICAL DATA:  Occluded dialysis graft. End-stage renal disease, needs access for hemodialysis. COMPARISON:  None EXAM: EXAM DIALYSIS GRAFT DECLOT (ATTEMPTED) VENOUS ANGIOPLASTY ULTRASOUND GUIDANCE FOR VASCULAR ACCESS X2 NON TUNNELED CENTRAL VENOUS CATHETER PLACEMENT UNDER FLUOROSCOPY TECHNIQUE: The procedure, risks (including but not limited to bleeding, infection, organ damage ), benefits, and alternatives were explained to the patient and spouse. Questions regarding the procedure were encouraged and answered. The spouse understands and consents to the procedure. Intravenous Fentanyl and Versed were administered as conscious sedation during continuous monitoring of the patient's level of consciousness and physiological / cardiorespiratory status by the radiology RN, with a total moderate sedation time of 12 minutes. The arterial limb of the graft was accessed antegrade with a 21-gauge micropuncture needle under real-time ultrasonic guidance after the overlying skin prepped with Betadine, draped in usual sterile fashion, infiltrated locally with 1% lidocaine. Needle exchanged over 018 guidewire for transitional dilator through which 4 mg t-PA was administered. Ultrasound imaging documentation was saved. Through the dilator, a Bentson wire was advanced to the venous anastomosis. Over this a 89F sheath was placed, through which a 5 [place] Kumpe catheter was advanced for outflow venography. This showed patency of the outflow venous system through the IVC. 3000 units heparin were administered. The Arrow PTD device  was used to macerate thrombus in the graft with 2 passes. Gentle hand injections showed residual stenosis in the arterial limb near the apex of the graft. This was dilated using a 7 mm x 4 cm Conquest angioplasty balloon with good response. Antegrade contrast flow was noted with gentle hand injection. Attention was then turned to the venous limb for retrograde access. Patient became apneic with low heart rate, no palpable pulse. CPR was initiated. Sedation reversal agents were administered. Patient regained spontaneous breathing and palpable pulse. Blood pressure regained preprocedure value. The declot procedure was terminated, the sheath removed and hemostasis achieved there with a 3-0 Monocryl suture. The patient's spouse was updated with the events. Attention was turned to placing a temporary dialysis catheter. Patency of the right IJ vein was confirmed with ultrasound with image documentation. An appropriate skin site was determined. Skin site was marked. Region was prepped using maximum barrier technique including cap and mask, sterile gown, sterile  gloves, large sterile sheet, and Chlorhexidine as cutaneous antisepsis. The region was infiltrated locally with 1% lidocaine. Under real-time ultrasound guidance, the right IJ vein was accessed with a 19 gauge needle; the needle tip within the vein was confirmed with ultrasound image documentation. The needle exchanged over a guidewire for vascular dilator which allowed advancement of a 20 cm Trialysis catheter. This was positioned with the tip at the cavoatrial junction. Spot chest radiograph shows good positioning and no pneumothorax. Catheter was flushed and sutured externally with 0-Prolene sutures. Patient tolerated the procedure well. FLUOROSCOPY TIME:  1.9 minutes, [id number] uGym2 DAP COMPLICATIONS: COMPLICATIONS Apnea, requiring reversal of sedation agents. SIR level B: Nominal therapy (including overnight admission for observation), no consequence. IMPRESSION: 1.  Incomplete declot of left thigh synthetic hemodialysis graft, due to sedation related complication. She can return for completion of this procedure electively. 2. Technically successful balloon angioplasty of intragraft stenosis. 3. Technically successful right IJ 20 cm trialysis hemodialysis catheter placement. ACCESS: Remains approachable for percutaneous intervention as needed. Electronically Signed   By: Lucrezia Europe M.D.   On: 11/20/2015 17:22    Scheduled Meds: . acidophilus  1 capsule Oral Daily  . alteplase  4 mg Intracatheter Once  . aspirin EC  81 mg Oral QHS  . bacitracin   Topical Daily  . calcitRIOL  0.75 mcg Oral Q M,W,F-HD  . cefTAZidime (FORTAZ)  IV  2 g Intravenous Q M,W,F-1800  . cinacalcet  60 mg Oral Q breakfast  . hydrocortisone   Rectal BID  . insulin aspart  0-5 Units Subcutaneous QHS  . insulin aspart  0-9 Units Subcutaneous TID WC  . insulin aspart  4 Units Subcutaneous TID WC  . insulin detemir  50 Units Subcutaneous QHS  . midodrine      . midodrine  20 mg Oral TID WC  . multivitamin  1 tablet Oral QHS  . pantoprazole  40 mg Oral Daily  . sevelamer carbonate  4,000 mg Oral TID WC   Continuous Infusions: . sodium chloride 10 mL/hr at 11/06/2015 1316    Time spent:35 minutes  Velvet Bathe  [facility] Pager [phone] If 7PM-7AM, please contact night-coverage at [email or web address], password [facility] 11/21/2015, 11:09 AM  LOS: 10 days

## 2015-11-21 NOTE — Progress Notes (Signed)
[facility] Progress Note   Subjective: on dialysis , not writhing around as much today, still HOH  Filed Vitals:   11/21/15 1030 11/21/15 1100 11/21/15 1108 11/21/15 1305  BP: 80/37 83/27 93/35    Pulse: 76 75 74   Temp:   98.1 F (36.7 C) 99 F (37.2 C)  TempSrc:   Oral Oral  Resp:   18   Height:      Weight:   109 kg (240 lb 4.8 oz)   SpO2:   100%     Inpatient medications: . acidophilus  1 capsule Oral Daily  . alteplase  4 mg Intracatheter Once  . aspirin EC  81 mg Oral QHS  . bacitracin   Topical Daily  . calcitRIOL  0.75 mcg Oral Q M,W,F-HD  . cefTAZidime (FORTAZ)  IV  2 g Intravenous Q M,W,F-1800  . cinacalcet  60 mg Oral Q breakfast  . hydrocortisone   Rectal BID  . insulin aspart  0-5 Units Subcutaneous QHS  . insulin aspart  0-9 Units Subcutaneous TID WC  . insulin aspart  4 Units Subcutaneous TID WC  . insulin detemir  50 Units Subcutaneous QHS  . midodrine  20 mg Oral TID WC  . multivitamin  1 tablet Oral QHS  . pantoprazole  40 mg Oral Daily  . sevelamer carbonate  4,000 mg Oral TID WC   . sodium chloride 10 mL/hr at 11/25/2015 1316   lactulose, loperamide, ondansetron **OR** ondansetron (ZOFRAN) IV, senna  Exam: Awake and alert, writhing about almost continuously No jvd Chest clear bilat RRR no mrg Abd soft ntnd very obese no ascites Bilat hands wrapped up No LE edema Neuro nonfocal  CXR last admit 3/13 > clear bilat  Dialysis: [facility] MWF  5h  105kg  2/2.5 bath  Hep 10,000   L thigh AVG Mircera: none Calcitriol: 0.75 ug mwf  Assessment: 1. 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 Cardiac arrest 3/22- narcotic-related 3. ESRD -MWF HD 4. 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 Chronic hypotension on midodrine, ACTH stim test normal 6. AMS - better 7. Volume - over dry wt, but unable to pull any fluid d/t low BP's on HD 8. Involuntary movement - poss d/t neurontin, stopped 2 days ago.  9. Hearing  loss - happens to her w Vanc, got 2days of vanc on admission, none for >1wk.  Will list as intolerance.   Plan - HD today and tomorrow.  No sig UF.  Poor prognosis. Would consider getting palliative care team involved.   Arlo Splinter MD [facility] pager [phone]    cell [phone] 11/20/2015, 5:39 PM    Recent Labs Lab 11/17/15 0206 11/18/15 0917  11/05/2015 1345 11/20/15 1725 11/21/15 0722  NA 131* 138  --  136 138 139  K 5.2* 4.6  --  4.1 4.1 4.3  CL 94* 96*  --   --  97* 99*  CO2 16* 21*  --   --  24 25  GLUCOSE 325* 223*  < > 91 84 70  BUN 83* 55*  --   --  40* 45*  CREATININE 7.17* 5.67*  --   --  5.17* 5.51*  CALCIUM 8.3* 8.2*  --   --  8.0* 7.9*  PHOS 5.7* 3.4  --   --  6.0*  --   < > = values in this interval not displayed.  Recent Labs Lab 11/17/15 0206 11/18/15 0917 11/20/15  1725  ALBUMIN 1.8* 1.7* 1.7*    Recent Labs Lab 11/08/2015 1604  11/21/2015 1345 11/20/15 1745 11/21/15 0722  WBC 12.6*  --   --   --  23.7*  HGB 9.9*  < > 11.6* 8.0* 8.3*  HCT 36.1  < > 34.0* 29.5* 28.7*  MCV 92.6  --   --   --  91.7  PLT 228  --   --   --  246  < > = values in this interval not displayed.

## 2015-11-21 NOTE — Progress Notes (Signed)
Recheck cbg=136. Pt transported back to room by transport team.

## 2015-11-21 NOTE — Progress Notes (Signed)
Report given to HD RN 

## 2015-11-21 NOTE — Progress Notes (Addendum)
Inpatient Diabetes Program Recommendations  AACE/ADA: New Consensus Statement on Inpatient Glycemic Control (2015)  Target Ranges:  Prepandial:   less than 140 mg/dL      Peak postprandial:   less than 180 mg/dL (1-2 hours)      Critically ill patients:  140 - 180 mg/dL  Results for [name], [name] (MRN [id number]) as of 11/21/2015 11:55  Ref. Range 11/21/2015 09:49 11/21/2015 10:08 11/21/2015 10:43 11/21/2015 11:08 11/21/2015 11:35  Glucose-Capillary Latest Ref Range: 65-99 mg/dL 59 (L) 77 77 67 136 (H)  Results for [name], [name] (MRN [id number]) as of 11/21/2015 11:55  Ref. Range 11/20/2015 08:36 11/20/2015 11:47 11/20/2015 18:42 11/20/2015 21:24  Glucose-Capillary Latest Ref Range: 65-99 mg/dL 133 (H) 74 66 73   Review of Glycemic Control   Current orders for Inpatient glycemic control: Levemir 50 units QHS, Novolog 0-9 units TID with meals, Novolog 0-5 units QHS, Novolog 4 units TID with meals  Inpatient Diabetes Program Recommendations: Insulin - Basal: Please consider decreasing Levemir to 45 units QHS.  Thanks, Barnie Alderman, RN, MSN, CDE Diabetes Coordinator Inpatient Diabetes Program [phone] (Team Pager from [place] to [place]) [phone] ([facility] office) [phone] [facility] office) [phone] [facility] office)

## 2015-11-21 NOTE — Progress Notes (Signed)
Pt converted to afib 90-110's. MD notified, no new orders given. Will continue to monitor.

## 2015-11-21 NOTE — Progress Notes (Signed)
Patient somnolent this am, presently in HD  Forearm wound looks good, hand flap used for closure mildly dusky at distal portions, but not bad overall Continued seropurulent drainage from drain.  Continue dressing changes.  Keep drain (presenty sewn in).  Will monitor stump healing, cannot exclude need to return to OR for further irrigation/debridement.  Micheline Rough, MD Hand Surgery [facility] [phone]

## 2015-11-21 NOTE — Progress Notes (Signed)
Notified MD of pt's BP 72/51 and elevated WBC count. Request for repeat LA, orders given for cbc and bmp given. Per MD bp is baseline, will continue to monitor.
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
## 2015-11-21 NOTE — Progress Notes (Signed)
Hemodialysis- Pt restless/agitated. Checked CBG= 59, given 1/2 amp d50 d/t questionable ability to tolerate food/fluids at this time. Recheck=77. Continue to monitor patient.

## 2015-11-21 NOTE — Progress Notes (Signed)
Hemodialysis- Dr. Jonnie Finner at bedside. Notified of low cbgs. Order to discontinue treatment early today. CBG checked prior to rinseback =67. Given 1 amp d50 during rinseback. Pt oriented to self only. Will call report. Continue to monitor.

## 2015-11-22 ENCOUNTER — Inpatient Hospital Stay (HOSPITAL_COMMUNITY): Payer: Medicare Other

## 2015-11-22 ENCOUNTER — Encounter (HOSPITAL_COMMUNITY): Payer: Self-pay | Admitting: Anesthesiology

## 2015-11-22 DIAGNOSIS — I959 Hypotension, unspecified: Secondary | ICD-10-CM

## 2015-11-22 DIAGNOSIS — E0844 Diabetes mellitus due to underlying condition with diabetic amyotrophy: Secondary | ICD-10-CM

## 2015-11-22 DIAGNOSIS — Z993 Dependence on wheelchair: Secondary | ICD-10-CM

## 2015-11-22 DIAGNOSIS — A419 Sepsis, unspecified organism: Principal | ICD-10-CM

## 2015-11-22 DIAGNOSIS — I469 Cardiac arrest, cause unspecified: Secondary | ICD-10-CM

## 2015-11-22 DIAGNOSIS — M79641 Pain in right hand: Secondary | ICD-10-CM

## 2015-11-22 LAB — PHOSPHORUS: PHOSPHORUS: 4.5 mg/dL (ref 2.5–4.6)

## 2015-11-22 LAB — CBC
HCT: 30.7 % — ABNORMAL LOW (ref 36.0–46.0)
HEMOGLOBIN: 8.3 g/dL — AB (ref 12.0–15.0)
MCH: 25 pg — AB (ref 26.0–34.0)
MCHC: 27 g/dL — ABNORMAL LOW (ref 30.0–36.0)
MCV: 92.5 fL (ref 78.0–100.0)
Platelets: 224 10*3/uL (ref 150–400)
RBC: 3.32 MIL/uL — AB (ref 3.87–5.11)
RDW: 19.4 % — ABNORMAL HIGH (ref 11.5–15.5)
WBC: 25.4 10*3/uL — AB (ref 4.0–10.5)

## 2015-11-22 LAB — IRON AND TIBC
Iron: 18 ug/dL — ABNORMAL LOW (ref 28–170)
SATURATION RATIOS: 9 % — AB (ref 10.4–31.8)
TIBC: 206 ug/dL — ABNORMAL LOW (ref 250–450)
UIBC: 188 ug/dL

## 2015-11-22 LAB — BASIC METABOLIC PANEL
ANION GAP: 14 (ref 5–15)
BUN: 20 mg/dL (ref 6–20)
CHLORIDE: 100 mmol/L — AB (ref 101–111)
CO2: 24 mmol/L (ref 22–32)
Calcium: 8.1 mg/dL — ABNORMAL LOW (ref 8.9–10.3)
Creatinine, Ser: 3.66 mg/dL — ABNORMAL HIGH (ref 0.44–1.00)
GFR calc Af Amer: 16 mL/min — ABNORMAL LOW (ref >60)
GFR calc non Af Amer: 14 mL/min — ABNORMAL LOW (ref >60)
Glucose, Bld: 132 mg/dL — ABNORMAL HIGH (ref 65–99)
POTASSIUM: 4.3 mmol/L (ref 3.5–5.1)
SODIUM: 138 mmol/L (ref 135–145)

## 2015-11-22 LAB — GLUCOSE, CAPILLARY
GLUCOSE-CAPILLARY: 125 mg/dL — AB (ref 65–99)
GLUCOSE-CAPILLARY: 117 mg/dL — AB (ref 65–99)
Glucose-Capillary: 109 mg/dL — ABNORMAL HIGH (ref 65–99)

## 2015-11-22 LAB — ALBUMIN: ALBUMIN: 1.6 g/dL — AB (ref 3.5–5.0)

## 2015-11-22 MED ORDER — DARBEPOETIN ALFA 100 MCG/0.5ML IJ SOSY
100.0000 ug | PREFILLED_SYRINGE | INTRAMUSCULAR | Status: DC
  Filled 2015-11-22: qty 0.5

## 2015-11-22 MED ORDER — METHADONE HCL 5 MG PO TABS
2.5000 mg | ORAL_TABLET | ORAL | Status: DC | PRN
  Administered 2015-11-22 – 2015-11-25 (×4): 2.5 mg via ORAL
  Filled 2015-11-22 (×4): qty 1

## 2015-11-22 MED ORDER — NA FERRIC GLUC CPLX IN SUCROSE 12.5 MG/ML IV SOLN
250.0000 mg | INTRAVENOUS | Status: DC
  Filled 2015-11-22: qty 20

## 2015-11-22 MED ORDER — HYDROMORPHONE HCL 1 MG/ML IJ SOLN
0.2500 mg | INTRAMUSCULAR | Status: DC | PRN
  Administered 2015-11-22 – 2015-11-24 (×14): 0.25 mg via INTRAVENOUS
  Filled 2015-11-22 (×12): qty 1

## 2015-11-22 MED ORDER — HYDROCORTISONE ACE-PRAMOXINE 1-1 % RE FOAM
1.0000 | Freq: Four times a day (QID) | RECTAL | Status: DC
  Administered 2015-11-22 – 2015-11-23 (×5): 1 via RECTAL
  Filled 2015-11-22 (×2): qty 10

## 2015-11-22 MED ORDER — METHADONE HCL 5 MG PO TABS
5.0000 mg | ORAL_TABLET | Freq: Every day | ORAL | Status: DC
  Administered 2015-11-22 – 2015-11-24 (×3): 5 mg via ORAL
  Filled 2015-11-22 (×3): qty 1

## 2015-11-22 MED ORDER — LIDOCAINE HCL 2 % EX GEL
1.0000 "application " | Freq: Once | CUTANEOUS | Status: AC
  Administered 2015-11-22: 1 via TOPICAL
  Filled 2015-11-22: qty 5

## 2015-11-22 MED ORDER — SENNOSIDES-DOCUSATE SODIUM 8.6-50 MG PO TABS
2.0000 | ORAL_TABLET | Freq: Two times a day (BID) | ORAL | Status: DC
  Administered 2015-11-22 – 2015-11-24 (×4): 2 via ORAL
  Filled 2015-11-22 (×4): qty 2

## 2015-11-22 MED ORDER — HYDROMORPHONE HCL 1 MG/ML IJ SOLN
0.1000 mg | Freq: Once | INTRAMUSCULAR | Status: AC
  Administered 2015-11-22: 0.1 mg via INTRAVENOUS
  Filled 2015-11-22: qty 1

## 2015-11-22 MED ORDER — DIAZEPAM 2.5 MG RE GEL
2.5000 mg | Freq: Once | RECTAL | Status: AC
  Administered 2015-11-22: 2.5 mg via RECTAL
  Filled 2015-11-22: qty 2.5

## 2015-11-22 MED ORDER — PREGABALIN 25 MG PO CAPS
25.0000 mg | ORAL_CAPSULE | Freq: Every day | ORAL | Status: DC
  Administered 2015-11-22 – 2015-11-24 (×3): 25 mg via ORAL
  Filled 2015-11-22 (×3): qty 1

## 2015-11-22 MED ORDER — HYDROCORTISONE 2.5 % RE CREA
TOPICAL_CREAM | Freq: Four times a day (QID) | RECTAL | Status: DC
  Administered 2015-11-22 – 2015-11-23 (×6): via RECTAL
  Filled 2015-11-22: qty 28.35

## 2015-11-22 NOTE — Progress Notes (Signed)
RN administered PRN dose of methadone to patient around 10:30 pm. At 11 pm RN was called to patients room by her husband c/o severe pain. RN called Dr. Hilma Favors and new orders were received and noted. RN will continue to monitor.

## 2015-11-22 NOTE — Progress Notes (Addendum)
[facility] PROGRESS NOTE  [name] WUJ:[id number] DOB: December 02, 1965 DOA: 2015/11/29 PCP: Tonya Mannan, MD  Brief narrative 50 year old female with history of chronic hypotension on midodrine, uncontrolled diabetes with peripheral vascular disease, end-stage renal disease on dialysis (M, W, F), history of staph aureus bacteremia with sepsis 2008, osteomyelitis of multiple digits of her hands and feet status post amputation, GERD, who is wheelchair-bound presented to the ED with fever and infection of her right middle finger. She reported having of fever of 101F at home. In the ED she had a temperature of 99.8 Fahrenheit, hypotensive with blood pressure 73/40 mmHg, normal heart rate and normal O2 sat. She was found to have necrosis of multiple fingertips of both her hands. Lactic acid of 5.74. She received 1 L IV normal saline bolus and IV vancomycin. Patient admitted to hospitalist service on stepdown. Seen by hand surgeon and underwent amputation of left thumb, left index finger, right small finger, right ring finger and right hand incision and drainage of mid palmar space with radical flexor tenosynovectomy, flexor tendon excision and open carpal tunnel release. Also had excisional debridement of partial-thickness skin of right dorsal hand and volar wrist wounds.  Assessment/Plan: Sepsis secondary to multiple infected fingers -went to the OR 4 times with amputation of several digits and finally underwent Right hand transmetacarpal amputation, with digital fillet-flap closure -now on IV Fortaz, Vanc stopped due to hearing loss -Seen by ID Dr.COmer -Ortho Dr.Thompson  Hypotension Chronic and is on midodrine, BP in 70s-80s -ACTH stim test normal -dont think this is accurate, mentation mostly appropriate but waxes and wanes  Vulvovaginal candidiasis - s/p Diflucan x 1 time   Hemorrhoids/Rectal pain -chronic hypotension making opiod use complicated -change senokot to BID, add  colace too -continue anusol change to QID and proctofoam change to QID -will FU KUB  Resp arrest -in setting of fentanyl and versed in IR on 3/22 -resolved with narcan  End-stage renal disease on dialysis Renal following and managing.  -BP limiting factor -access isues  Uncontrolled mellitus Continue Lantus with SSI  Peripheral vascular disease Continue aspirin  Hearing loss -pt attributes this to Vancomycin, ongoing for 6-7days  ETHICs: OVERALL prognosis poor, ESRD, Very poor functional status, bed bound for years,  requiring 4 surgeries this admission, Chronic severe hypotension, limiting HD, cardiac arrest this admission. Palliative consulting, need Goals of care meeting: Addendum:  Talked to husband [name] about Poor prognosis and need for Honest Palliative discussion and goals of care and consideration of Hospice. He was very tearful but understanding of her issues Also called and talked to pts father [name] per [facility] request, [name] understands how sick she is and is completely agreeable to Hospice/Palliative discussions  DVT prophylaxis: subcutaneous heparin  Diet: Renal/diabetic  Code Status: Full code Family Communication: Spouse [name] Disposition Plan: Keep in SDU   Consultants:  Nephrology  Hand surgery  Procedures:  Patient of multiple fingers, right hand I&D on 3/13  Antibiotics:   IV vancomycin since 3/13  HPI/Subjective: Patient reports discomfort originating from her anus she attributes to hemorrhoids  Objective: Filed Vitals:   11/22/15 0815 11/22/15 0830  BP: 69/37 76/42  Pulse: 103 100  Temp:    Resp:      Intake/Output Summary (Last 24 hours) at 11/22/15 0907 Last data filed at 11/21/15 1700  Gross per 24 hour  Intake      0 ml  Output   -272 ml  Net    272 ml  Filed Weights   11/21/15 1108 11/22/15 0400 11/22/15 0805  Weight: 109 kg (240 lb 4.8 oz) 111 kg (244 lb 11.4 oz) 108 kg (238 lb 1.6  oz)    Exam:   General:  Middle aged female, awake and alert, chronically ill appearing  HEENT: Moist mucosa, supple neck  Chest: Clear bilaterally, no wheezes  CVS: Normal S1-S2, no murmurs or gallop  GI: Soft, nondistended, nontender, bowel sounds present  Musculoskeletal: Dressing over right hand intact, dressing over left thumb and index finger intact  CNS: Alert and awake, moves upper extremities equally   Data Reviewed: Basic Metabolic Panel:  Recent Labs Lab 11/17/15 0206 11/18/15 0917 11/18/15 2148 11/04/2015 1345 11/20/15 1725 11/21/15 0722 11/22/15 0530  NA 131* 138  --  136 138 139 138  K 5.2* 4.6  --  4.1 4.1 4.3 4.3  CL 94* 96*  --   --  97* 99* 100*  CO2 16* 21*  --   --  24 25 24   GLUCOSE 325* 223* 138* 91 84 70 132*  BUN 83* 55*  --   --  40* 45* 20  CREATININE 7.17* 5.67*  --   --  5.17* 5.51* 3.66*  CALCIUM 8.3* 8.2*  --   --  8.0* 7.9* 8.1*  PHOS 5.7* 3.4  --   --  6.0*  --   --    Liver Function Tests:  Recent Labs Lab 11/17/15 0206 11/18/15 0917 11/20/15 1725  ALBUMIN 1.8* 1.7* 1.7*   No results for input(s): LIPASE, AMYLASE in the last 168 hours. No results for input(s): AMMONIA in the last 168 hours. CBC:  Recent Labs Lab 11/08/2015 0836 11/13/2015 1345 11/20/15 1745 11/21/15 0722 11/22/15 0530  WBC  --   --   --  23.7* 25.4*  HGB 11.9* 11.6* 8.0* 8.3* 8.3*  HCT 35.0* 34.0* 29.5* 28.7* 30.7*  MCV  --   --   --  91.7 92.5  PLT  --   --   --  246 224   Cardiac Enzymes: No results for input(s): CKTOTAL, CKMB, CKMBINDEX, TROPONINI in the last 168 hours. BNP (last 3 results) No results for input(s): BNP in the last 8760 hours.  ProBNP (last 3 results) No results for input(s): PROBNP in the last 8760 hours.  CBG:  Recent Labs Lab 11/21/15 1135 11/21/15 1206 11/21/15 1736 11/21/15 2103 11/22/15 0744  GLUCAP 136* 115* 88 111* 125*    No results found for this or any previous visit (from the past 240 hour(s)).    Studies: Ir Fluoro Guide Cv Line Right  11/20/2015  CLINICAL DATA:  Occluded dialysis graft. End-stage renal disease, needs access for hemodialysis. COMPARISON:  None EXAM: EXAM DIALYSIS GRAFT DECLOT (ATTEMPTED) VENOUS ANGIOPLASTY ULTRASOUND GUIDANCE FOR VASCULAR ACCESS X2 NON TUNNELED CENTRAL VENOUS CATHETER PLACEMENT UNDER FLUOROSCOPY TECHNIQUE: The procedure, risks (including but not limited to bleeding, infection, organ damage ), benefits, and alternatives were explained to the patient and spouse. Questions regarding the procedure were encouraged and answered. The spouse understands and consents to the procedure. Intravenous Fentanyl and Versed were administered as conscious sedation during continuous monitoring of the patient's level of consciousness and physiological / cardiorespiratory status by the radiology RN, with a total moderate sedation time of 12 minutes. The arterial limb of the graft was accessed antegrade with a 21-gauge micropuncture needle under real-time ultrasonic guidance after the overlying skin prepped with Betadine, draped in usual sterile fashion, infiltrated locally with 1% lidocaine. Needle exchanged over  018 guidewire for transitional dilator through which 4 mg t-PA was administered. Ultrasound imaging documentation was saved. Through the dilator, a Bentson wire was advanced to the venous anastomosis. Over this a 67F sheath was placed, through which a 5 [place] Kumpe catheter was advanced for outflow venography. This showed patency of the outflow venous system through the IVC. 3000 units heparin were administered. The Arrow PTD device was used to macerate thrombus in the graft with 2 passes. Gentle hand injections showed residual stenosis in the arterial limb near the apex of the graft. This was dilated using a 7 mm x 4 cm Conquest angioplasty balloon with good response. Antegrade contrast flow was noted with gentle hand injection. Attention was then turned to the venous limb for  retrograde access. Patient became apneic with low heart rate, no palpable pulse. CPR was initiated. Sedation reversal agents were administered. Patient regained spontaneous breathing and palpable pulse. Blood pressure regained preprocedure value. The declot procedure was terminated, the sheath removed and hemostasis achieved there with a 3-0 Monocryl suture. The patient's spouse was updated with the events. Attention was turned to placing a temporary dialysis catheter. Patency of the right IJ vein was confirmed with ultrasound with image documentation. An appropriate skin site was determined. Skin site was marked. Region was prepped using maximum barrier technique including cap and mask, sterile gown, sterile gloves, large sterile sheet, and Chlorhexidine as cutaneous antisepsis. The region was infiltrated locally with 1% lidocaine. Under real-time ultrasound guidance, the right IJ vein was accessed with a 19 gauge needle; the needle tip within the vein was confirmed with ultrasound image documentation. The needle exchanged over a guidewire for vascular dilator which allowed advancement of a 20 cm Trialysis catheter. This was positioned with the tip at the cavoatrial junction. Spot chest radiograph shows good positioning and no pneumothorax. Catheter was flushed and sutured externally with 0-Prolene sutures. Patient tolerated the procedure well. FLUOROSCOPY TIME:  1.9 minutes, 140 uGym2 DAP COMPLICATIONS: COMPLICATIONS Apnea, requiring reversal of sedation agents. SIR level B: Nominal therapy (including overnight admission for observation), no consequence. IMPRESSION: 1. Incomplete declot of left thigh synthetic hemodialysis graft, due to sedation related complication. She can return for completion of this procedure electively. 2. Technically successful balloon angioplasty of intragraft stenosis. 3. Technically successful right IJ 20 cm trialysis hemodialysis catheter placement. ACCESS: Remains approachable for  percutaneous intervention as needed. Electronically Signed   By: Corlis Leak M.D.   On: 11/20/2015 17:22   Ir US Guide Vasc Access Left  11/20/2015  CLINICAL DATA:  Occluded dialysis graft. End-stage renal disease, needs access for hemodialysis. COMPARISON:  None EXAM: EXAM DIALYSIS GRAFT DECLOT (ATTEMPTED) VENOUS ANGIOPLASTY ULTRASOUND GUIDANCE FOR VASCULAR ACCESS X2 NON TUNNELED CENTRAL VENOUS CATHETER PLACEMENT UNDER FLUOROSCOPY TECHNIQUE: The procedure, risks (including but not limited to bleeding, infection, organ damage ), benefits, and alternatives were explained to the patient and spouse. Questions regarding the procedure were encouraged and answered. The spouse understands and consents to the procedure. Intravenous Fentanyl and Versed were administered as conscious sedation during continuous monitoring of the patient's level of consciousness and physiological / cardiorespiratory status by the radiology RN, with a total moderate sedation time of 12 minutes. The arterial limb of the graft was accessed antegrade with a 21-gauge micropuncture needle under real-time ultrasonic guidance after the overlying skin prepped with Betadine, draped in usual sterile fashion, infiltrated locally with 1% lidocaine. Needle exchanged over 018 guidewire for transitional dilator through which 4 mg t-PA was administered. Ultrasound imaging documentation  was saved. Through the dilator, a Bentson wire was advanced to the venous anastomosis. Over this a 97F sheath was placed, through which a 5 [place] Kumpe catheter was advanced for outflow venography. This showed patency of the outflow venous system through the IVC. 3000 units heparin were administered. The Arrow PTD device was used to macerate thrombus in the graft with 2 passes. Gentle hand injections showed residual stenosis in the arterial limb near the apex of the graft. This was dilated using a 7 mm x 4 cm Conquest angioplasty balloon with good response. Antegrade contrast  flow was noted with gentle hand injection. Attention was then turned to the venous limb for retrograde access. Patient became apneic with low heart rate, no palpable pulse. CPR was initiated. Sedation reversal agents were administered. Patient regained spontaneous breathing and palpable pulse. Blood pressure regained preprocedure value. The declot procedure was terminated, the sheath removed and hemostasis achieved there with a 3-0 Monocryl suture. The patient's spouse was updated with the events. Attention was turned to placing a temporary dialysis catheter. Patency of the right IJ vein was confirmed with ultrasound with image documentation. An appropriate skin site was determined. Skin site was marked. Region was prepped using maximum barrier technique including cap and mask, sterile gown, sterile gloves, large sterile sheet, and Chlorhexidine as cutaneous antisepsis. The region was infiltrated locally with 1% lidocaine. Under real-time ultrasound guidance, the right IJ vein was accessed with a 19 gauge needle; the needle tip within the vein was confirmed with ultrasound image documentation. The needle exchanged over a guidewire for vascular dilator which allowed advancement of a 20 cm Trialysis catheter. This was positioned with the tip at the cavoatrial junction. Spot chest radiograph shows good positioning and no pneumothorax. Catheter was flushed and sutured externally with 0-Prolene sutures. Patient tolerated the procedure well. FLUOROSCOPY TIME:  1.9 minutes, 140 uGym2 DAP COMPLICATIONS: COMPLICATIONS Apnea, requiring reversal of sedation agents. SIR level B: Nominal therapy (including overnight admission for observation), no consequence. IMPRESSION: 1. Incomplete declot of left thigh synthetic hemodialysis graft, due to sedation related complication. She can return for completion of this procedure electively. 2. Technically successful balloon angioplasty of intragraft stenosis. 3. Technically successful  right IJ 20 cm trialysis hemodialysis catheter placement. ACCESS: Remains approachable for percutaneous intervention as needed. Electronically Signed   By: Corlis Leak M.D.   On: 11/20/2015 17:22   Ir US Guide Vasc Access Right  11/20/2015  CLINICAL DATA:  Occluded dialysis graft. End-stage renal disease, needs access for hemodialysis. COMPARISON:  None EXAM: EXAM DIALYSIS GRAFT DECLOT (ATTEMPTED) VENOUS ANGIOPLASTY ULTRASOUND GUIDANCE FOR VASCULAR ACCESS X2 NON TUNNELED CENTRAL VENOUS CATHETER PLACEMENT UNDER FLUOROSCOPY TECHNIQUE: The procedure, risks (including but not limited to bleeding, infection, organ damage ), benefits, and alternatives were explained to the patient and spouse. Questions regarding the procedure were encouraged and answered. The spouse understands and consents to the procedure. Intravenous Fentanyl and Versed were administered as conscious sedation during continuous monitoring of the patient's level of consciousness and physiological / cardiorespiratory status by the radiology RN, with a total moderate sedation time of 12 minutes. The arterial limb of the graft was accessed antegrade with a 21-gauge micropuncture needle under real-time ultrasonic guidance after the overlying skin prepped with Betadine, draped in usual sterile fashion, infiltrated locally with 1% lidocaine. Needle exchanged over 018 guidewire for transitional dilator through which 4 mg t-PA was administered. Ultrasound imaging documentation was saved. Through the dilator, a Bentson wire was advanced to the venous anastomosis. Over  this a 58F sheath was placed, through which a 5 [place] Kumpe catheter was advanced for outflow venography. This showed patency of the outflow venous system through the IVC. 3000 units heparin were administered. The Arrow PTD device was used to macerate thrombus in the graft with 2 passes. Gentle hand injections showed residual stenosis in the arterial limb near the apex of the graft. This was  dilated using a 7 mm x 4 cm Conquest angioplasty balloon with good response. Antegrade contrast flow was noted with gentle hand injection. Attention was then turned to the venous limb for retrograde access. Patient became apneic with low heart rate, no palpable pulse. CPR was initiated. Sedation reversal agents were administered. Patient regained spontaneous breathing and palpable pulse. Blood pressure regained preprocedure value. The declot procedure was terminated, the sheath removed and hemostasis achieved there with a 3-0 Monocryl suture. The patient's spouse was updated with the events. Attention was turned to placing a temporary dialysis catheter. Patency of the right IJ vein was confirmed with ultrasound with image documentation. An appropriate skin site was determined. Skin site was marked. Region was prepped using maximum barrier technique including cap and mask, sterile gown, sterile gloves, large sterile sheet, and Chlorhexidine as cutaneous antisepsis. The region was infiltrated locally with 1% lidocaine. Under real-time ultrasound guidance, the right IJ vein was accessed with a 19 gauge needle; the needle tip within the vein was confirmed with ultrasound image documentation. The needle exchanged over a guidewire for vascular dilator which allowed advancement of a 20 cm Trialysis catheter. This was positioned with the tip at the cavoatrial junction. Spot chest radiograph shows good positioning and no pneumothorax. Catheter was flushed and sutured externally with 0-Prolene sutures. Patient tolerated the procedure well. FLUOROSCOPY TIME:  1.9 minutes, 140 uGym2 DAP COMPLICATIONS: COMPLICATIONS Apnea, requiring reversal of sedation agents. SIR level B: Nominal therapy (including overnight admission for observation), no consequence. IMPRESSION: 1. Incomplete declot of left thigh synthetic hemodialysis graft, due to sedation related complication. She can return for completion of this procedure electively. 2.  Technically successful balloon angioplasty of intragraft stenosis. 3. Technically successful right IJ 20 cm trialysis hemodialysis catheter placement. ACCESS: Remains approachable for percutaneous intervention as needed. Electronically Signed   By: Corlis Leak M.D.   On: 11/20/2015 17:22   Dg Chest Port 1 View  11/20/2015  CLINICAL DATA:  Status post hemodialysis catheter placement EXAM: PORTABLE CHEST 1 VIEW COMPARISON:  11/07/2015 FINDINGS: There is a dual-lumen right-sided hemodialysis catheter with the tip projecting over the cavoatrial junction. There is no focal parenchymal opacity. There is no pleural effusion or pneumothorax. The heart and mediastinal contours are unremarkable. The osseous structures are unremarkable. IMPRESSION: Dual-lumen right-sided hemodialysis catheter with the tip projecting over the cavoatrial junction. Electronically Signed   By: Elige Ko   On: 11/20/2015 18:45   Ir Thrombectomy Av Fistula W/thrombolysis/pta/stent Inc Shunt Img Lt  11/20/2015  CLINICAL DATA:  Occluded dialysis graft. End-stage renal disease, needs access for hemodialysis. COMPARISON:  None EXAM: EXAM DIALYSIS GRAFT DECLOT (ATTEMPTED) VENOUS ANGIOPLASTY ULTRASOUND GUIDANCE FOR VASCULAR ACCESS X2 NON TUNNELED CENTRAL VENOUS CATHETER PLACEMENT UNDER FLUOROSCOPY TECHNIQUE: The procedure, risks (including but not limited to bleeding, infection, organ damage ), benefits, and alternatives were explained to the patient and spouse. Questions regarding the procedure were encouraged and answered. The spouse understands and consents to the procedure. Intravenous Fentanyl and Versed were administered as conscious sedation during continuous monitoring of the patient's level of consciousness and physiological / cardiorespiratory status  by the radiology RN, with a total moderate sedation time of 12 minutes. The arterial limb of the graft was accessed antegrade with a 21-gauge micropuncture needle under real-time ultrasonic  guidance after the overlying skin prepped with Betadine, draped in usual sterile fashion, infiltrated locally with 1% lidocaine. Needle exchanged over 018 guidewire for transitional dilator through which 4 mg t-PA was administered. Ultrasound imaging documentation was saved. Through the dilator, a Bentson wire was advanced to the venous anastomosis. Over this a 77F sheath was placed, through which a 5 [place] Kumpe catheter was advanced for outflow venography. This showed patency of the outflow venous system through the IVC. 3000 units heparin were administered. The Arrow PTD device was used to macerate thrombus in the graft with 2 passes. Gentle hand injections showed residual stenosis in the arterial limb near the apex of the graft. This was dilated using a 7 mm x 4 cm Conquest angioplasty balloon with good response. Antegrade contrast flow was noted with gentle hand injection. Attention was then turned to the venous limb for retrograde access. Patient became apneic with low heart rate, no palpable pulse. CPR was initiated. Sedation reversal agents were administered. Patient regained spontaneous breathing and palpable pulse. Blood pressure regained preprocedure value. The declot procedure was terminated, the sheath removed and hemostasis achieved there with a 3-0 Monocryl suture. The patient's spouse was updated with the events. Attention was turned to placing a temporary dialysis catheter. Patency of the right IJ vein was confirmed with ultrasound with image documentation. An appropriate skin site was determined. Skin site was marked. Region was prepped using maximum barrier technique including cap and mask, sterile gown, sterile gloves, large sterile sheet, and Chlorhexidine as cutaneous antisepsis. The region was infiltrated locally with 1% lidocaine. Under real-time ultrasound guidance, the right IJ vein was accessed with a 19 gauge needle; the needle tip within the vein was confirmed with ultrasound image  documentation. The needle exchanged over a guidewire for vascular dilator which allowed advancement of a 20 cm Trialysis catheter. This was positioned with the tip at the cavoatrial junction. Spot chest radiograph shows good positioning and no pneumothorax. Catheter was flushed and sutured externally with 0-Prolene sutures. Patient tolerated the procedure well. FLUOROSCOPY TIME:  1.9 minutes, 140 uGym2 DAP COMPLICATIONS: COMPLICATIONS Apnea, requiring reversal of sedation agents. SIR level B: Nominal therapy (including overnight admission for observation), no consequence. IMPRESSION: 1. Incomplete declot of left thigh synthetic hemodialysis graft, due to sedation related complication. She can return for completion of this procedure electively. 2. Technically successful balloon angioplasty of intragraft stenosis. 3. Technically successful right IJ 20 cm trialysis hemodialysis catheter placement. ACCESS: Remains approachable for percutaneous intervention as needed. Electronically Signed   By: Corlis Leak M.D.   On: 11/20/2015 17:22    Scheduled Meds: . acidophilus  1 capsule Oral Daily  . alteplase  4 mg Intracatheter Once  . aspirin EC  81 mg Oral QHS  . bacitracin   Topical Daily  . calcitRIOL  0.75 mcg Oral Q M,W,F-HD  . cefTAZidime (FORTAZ)  IV  2 g Intravenous Q M,W,F-1800  . cinacalcet  60 mg Oral Q breakfast  . hydrocortisone   Rectal QID  . hydrocortisone-pramoxine  1 applicator Rectal QID  . insulin aspart  0-5 Units Subcutaneous QHS  . insulin aspart  0-9 Units Subcutaneous TID WC  . insulin aspart  4 Units Subcutaneous TID WC  . insulin detemir  20 Units Subcutaneous QHS  . lidocaine  1 patch Transdermal Q24H  .  midodrine  20 mg Oral TID WC  . multivitamin  1 tablet Oral QHS  . pantoprazole  40 mg Oral Daily  . polyethylene glycol  17 g Oral BID  . senna-docusate  2 tablet Oral BID  . sevelamer carbonate  4,000 mg Oral TID WC   Continuous Infusions: . sodium chloride 10 mL/hr at  2015-11-27 1316    Time spent:35 minutes  Kierrah Kilbride  [facility] Pager [phone] If 7PM-7AM, please contact night-coverage at [email or web address], password [facility] 11/22/2015, 9:07 AM  LOS: 11 days

## 2015-11-22 NOTE — Progress Notes (Signed)
Awake, alert, communicative  R FA swollen, drain continues with milky pinkish discharge Flap used for closure not looking bad  Will plan OR at 0730 for at least partial wound opening, likely placement of irrigation cathether, leaving stump partially open for drainage.  Consent obtained.  Micheline Rough, MD [facility] Mobile [phone]

## 2015-11-22 NOTE — Progress Notes (Addendum)
RN entered patient's room this evening around 8:30pm to have her sign the consent for surgery tomorrow. Both she and her husband voiced their dismay at having surgery tomorrow with Dr. Grandville Silos. Both are in agreement that they want to progress toward comfort care and understand limitations of this. RN further answered questions regarding what "it" means to change their mind. As RN was leaving patients room, she whispered in RN's ear and stated "so how long will this hospice thing take"? RN then replied that she would let palliative care know both her and [name]'s change in their decision. Husband [name] visibly distraught and in tears during RN's discussion with patient. Dr. Grandville Silos called and spoke at length with both RN and patient regarding their change in status via the telephone. RN will continue to monitor.

## 2015-11-22 NOTE — Progress Notes (Signed)
Palliative care doctor arrived while I was outside allowing nurse to check on patient. Husband requested I come back in. Talked to doctor after. Father and son clearly understand gravity of condition. Wife at this particular moment may not be grasping it. She requested I return after her surgery.

## 2015-11-22 NOTE — Progress Notes (Signed)
[facility] Progress Note   Subjective: on dialysis , invol movements gone, still tinnitus and main issue today is pain in rear end , having severe pain w defecation, will examine her after HD  Filed Vitals:   11/22/15 0732 11/22/15 0805 11/22/15 0811 11/22/15 0815  BP: 77/55 69/35 76/27  69/37  Pulse:  90 76 103  Temp:  97.1 F (36.2 C)    TempSrc:  Oral    Resp:  21    Height:      Weight:  108 kg (238 lb 1.6 oz)    SpO2:  100%      Inpatient medications: . acidophilus  1 capsule Oral Daily  . alteplase  4 mg Intracatheter Once  . aspirin EC  81 mg Oral QHS  . bacitracin   Topical Daily  . calcitRIOL  0.75 mcg Oral Q M,W,F-HD  . cefTAZidime (FORTAZ)  IV  2 g Intravenous Q M,W,F-1800  . cinacalcet  60 mg Oral Q breakfast  . hydrocortisone   Rectal BID  . hydrocortisone-pramoxine  1 applicator Rectal BID  . insulin aspart  0-5 Units Subcutaneous QHS  . insulin aspart  0-9 Units Subcutaneous TID WC  . insulin aspart  4 Units Subcutaneous TID WC  . insulin detemir  20 Units Subcutaneous QHS  . lidocaine  1 patch Transdermal Q24H  . midodrine  20 mg Oral TID WC  . multivitamin  1 tablet Oral QHS  . pantoprazole  40 mg Oral Daily  . polyethylene glycol  17 g Oral BID  . senna-docusate  2 tablet Oral QHS  . sevelamer carbonate  4,000 mg Oral TID WC   . sodium chloride 10 mL/hr at 11/14/2015 1316   lactulose, loperamide, ondansetron **OR** ondansetron (ZOFRAN) IV, senna  Exam: Awake and alert, no further invol movement No jvd Chest clear bilat RRR no mrg Abd soft ntnd very obese no ascites Bilat hands wrapped up No LE edema Neuro nonfocal  CXR last admit 3/13 > clear bilat  Dialysis: [place] MWF  5h  105kg  2/2.5 bath  Hep 10,000   L thigh AVG Mircera: none Calcitriol: 0.75 ug mwf  Assessment: 1. Rectal pain - w BM's, will examine after HD 2. 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 Cardiac arrest 3/22-  narcotic-related 4. ESRD -MWF HD 5. 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 Chronic hypotension- runs in 70's-80's on HD per pt 7. Volume - wt's off, no vol on exam 8. Involuntary movement - poss d/t neurontin, stopped 2 days ago.  9. 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 Check rectal exam after HD and KUB now.   Peightyn Splinter MD [facility] pager [phone]    cell [phone] 11/20/2015, 5:39 PM    Recent Labs Lab 11/17/15 0206 11/18/15 0917  11/20/15 1725 11/21/15 0722 11/22/15 0530  NA 131* 138  < > 138 139 138  K 5.2* 4.6  < > 4.1 4.3 4.3  CL 94* 96*  --  97* 99* 100*  CO2 16* 21*  --  24 25 24   GLUCOSE 325* 223*  < > 84 70 132*  BUN 83* 55*  --  40* 45* 20  CREATININE 7.17* 5.67*  --  5.17* 5.51* 3.66*  CALCIUM 8.3* 8.2*  --  8.0* 7.9* 8.1*  PHOS 5.7* 3.4  --  6.0*  --   --   < > =  values in this interval not displayed.  Recent Labs Lab 11/17/15 0206 11/18/15 0917 11/20/15 1725  ALBUMIN 1.8* 1.7* 1.7*    Recent Labs Lab 11/20/15 1745 11/21/15 0722 11/22/15 0530  WBC  --  23.7* 25.4*  HGB 8.0* 8.3* 8.3*  HCT 29.5* 28.7* 30.7*  MCV  --  91.7 92.5  PLT  --  246 224

## 2015-11-22 NOTE — Care Management Important Message (Signed)
Important Message  Patient Details  Name: [name] MRN: [id number] Date of Birth: 18-May-1966   Medicare Important Message Given:  Yes    Jaivion Kingsley P Shafin Pollio 11/22/2015, 12:38 PM

## 2015-11-22 NOTE — Progress Notes (Signed)
Report given to hemodialysis RN.  

## 2015-11-22 NOTE — Anesthesia Preprocedure Evaluation (Deleted)
Anesthesia Evaluation  Patient identified by MRN, date of birth, ID band Patient awake    Reviewed: Allergy & Precautions, NPO status , Patient's Chart, lab work & pertinent test results  History of Anesthesia Complications Negative for: history of anesthetic complications  Airway Mallampati: I  TM Distance: >3 FB Neck ROM: Full    Dental  (+) Dental Advisory Given   Pulmonary neg pulmonary ROS,    breath sounds clear to auscultation       Cardiovascular (-) angina+ DVT   Rhythm:Regular Rate:Normal     Neuro/Psych negative neurological ROS     GI/Hepatic Neg liver ROS, GERD  Medicated and Controlled,  Endo/Other  diabetes, Insulin DependentMorbid obesity  Renal/GU Dialysis and ESRFRenal disease (MWF)     Musculoskeletal   Abdominal (+) + obese,   Peds  Hematology   Anesthesia Other Findings   Reproductive/Obstetrics                             Anesthesia Physical  Anesthesia Plan  ASA: III  Anesthesia Plan: General   Post-op Pain Management:    Induction: Intravenous  Airway Management Planned: LMA  Additional Equipment:   Intra-op Plan:   Post-operative Plan:   Informed Consent: I have reviewed the patients History and Physical, chart, labs and discussed the procedure including the risks, benefits and alternatives for the proposed anesthesia with the patient or authorized representative who has indicated his/her understanding and acceptance.   Dental advisory given  Plan Discussed with: CRNA and Surgeon  Anesthesia Plan Comments: (Plan routine monitors, GA- LMA OK)        Anesthesia Quick Evaluation

## 2015-11-22 NOTE — Progress Notes (Signed)
   11/22/15 1500  Clinical Encounter Type  Visited With Patient;Family;Patient and family together  Visit Type Spiritual support  Referral From Family  Spiritual Encounters  Spiritual Needs Emotional;Prayer;Grief support  Stress Factors  Patient Stress Factors Health changes  Family Stress Factors Health changes;Major life changes;Loss;Exhausted  Chaplain spoke to husband, then wife, then both, then husband. Husband seems to be preparing for wife's death and is distraught and forlorn. Says that he and patient's father are discussing Hospice care. Patient indicated desire for 3 specific things: end to pain in hand, correction of bowel malfunction, and restoration of hearing (loss she attributes to vanc). Discussed her desire to go home, where husband is apparently sole caregiver. Husband cried continually throughout visit, over an hour. Spoke of never recovering from her death and of other losses. Indicated that he would not hurt himself because he would not do that to his family. Has been deeply depressed before and thinks he was on Prozac. Chaplain recommended he talk to therapist for assistance with anticipatory grief and/or seek out a support group through Hospice. Chaplain brought [employer] book to read to patient, at her request. When chaplain and husband returned from walk, patient said surgeon had come by and planned operation on her arm.

## 2015-11-22 NOTE — Progress Notes (Signed)
Stat chest xray done per Dr. Jonnie Finner. Pt anxious, verbalized some shortness of breath to MD. Asked patient to practice slow deep breathing. Sats remain at 100%, placed on 02 for comfort. Pt is now sleeping. Vitals WNL for pt.

## 2015-11-22 NOTE — Progress Notes (Deleted)
[facility] Progress Note   Subjective: on dialysis , invol movements gone, still tinnitus and main issue today is pain in rear end , having severe pain w defecation, will examine her after HD  Filed Vitals:   11/22/15 0000 11/22/15 0400 11/22/15 0731 11/22/15 0732  BP: 67/40 64/44 85/49  77/55  Pulse: 111 100 102   Temp: 97.4 F (36.3 C) 97.4 F (36.3 C)    TempSrc: Axillary Axillary    Resp: 18 19 24    Height:  5\' 7"  (1.702 m)    Weight:  111 kg (244 lb 11.4 oz)    SpO2: 98% 98% 98%     Inpatient medications: . acidophilus  1 capsule Oral Daily  . alteplase  4 mg Intracatheter Once  . aspirin EC  81 mg Oral QHS  . bacitracin   Topical Daily  . calcitRIOL  0.75 mcg Oral Q M,W,F-HD  . cefTAZidime (FORTAZ)  IV  2 g Intravenous Q M,W,F-1800  . cinacalcet  60 mg Oral Q breakfast  . hydrocortisone   Rectal BID  . hydrocortisone-pramoxine  1 applicator Rectal BID  . insulin aspart  0-5 Units Subcutaneous QHS  . insulin aspart  0-9 Units Subcutaneous TID WC  . insulin aspart  4 Units Subcutaneous TID WC  . insulin detemir  20 Units Subcutaneous QHS  . lidocaine  1 patch Transdermal Q24H  . midodrine  20 mg Oral TID WC  . multivitamin  1 tablet Oral QHS  . pantoprazole  40 mg Oral Daily  . polyethylene glycol  17 g Oral BID  . senna-docusate  2 tablet Oral QHS  . sevelamer carbonate  4,000 mg Oral TID WC   . sodium chloride 10 mL/hr at 11/10/2015 1316   lactulose, loperamide, ondansetron **OR** ondansetron (ZOFRAN) IV, senna  Exam: Awake and alert, no further invol movement No jvd Chest clear bilat RRR no mrg Abd soft ntnd very obese no ascites Bilat hands wrapped up No LE edema Neuro nonfocal  CXR last admit 3/13 > clear bilat  Dialysis: [place] MWF  5h  105kg  2/2.5 bath  Hep 10,000   L thigh AVG Mircera: none Calcitriol: 0.75 ug mwf  Assessment: 1. Rectal pain - w BM's, will examine after HD 2. Sepsis/ infected wounds R hand - sp  transmetacarpal amputation 3/21 3. Cardiac arrest 3/22- narcotic-related 4. ESRD -MWF HD 5. 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 Chronic hypotension- runs in 70's-80's on HD per pt 7. Volume - wt's off, no vol on exam 8. Involuntary movement - poss d/t neurontin, stopped 2 days ago.  9. 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 Check rectal exam after HD and KUB now.   Jahyra Splinter MD [facility] pager [phone]    cell [phone] 11/20/2015, 5:39 PM    Recent Labs Lab 11/17/15 0206 11/18/15 0917  11/20/15 1725 11/21/15 0722 11/22/15 0530  NA 131* 138  < > 138 139 138  K 5.2* 4.6  < > 4.1 4.3 4.3  CL 94* 96*  --  97* 99* 100*  CO2 16* 21*  --  24 25 24   GLUCOSE 325* 223*  < > 84 70 132*  BUN 83* 55*  --  40* 45* 20  CREATININE 7.17* 5.67*  --  5.17* 5.51* 3.66*  CALCIUM 8.3* 8.2*  --  8.0* 7.9* 8.1*  PHOS 5.7* 3.4  --  6.0*  --   --   < > =  values in this interval not displayed.  Recent Labs Lab 11/17/15 0206 11/18/15 0917 11/20/15 1725  ALBUMIN 1.8* 1.7* 1.7*    Recent Labs Lab 11/20/15 1745 11/21/15 0722 11/22/15 0530  WBC  --  23.7* 25.4*  HGB 8.0* 8.3* 8.3*  HCT 29.5* 28.7* 30.7*  MCV  --  91.7 92.5  PLT  --  246 224

## 2015-11-23 ENCOUNTER — Encounter (HOSPITAL_COMMUNITY): Admission: EM | Disposition: E | Payer: Self-pay | Source: Home / Self Care | Attending: Family Medicine

## 2015-11-23 LAB — BASIC METABOLIC PANEL
ANION GAP: 18 — AB (ref 5–15)
BUN: 15 mg/dL (ref 6–20)
CHLORIDE: 98 mmol/L — AB (ref 101–111)
CO2: 21 mmol/L — AB (ref 22–32)
CREATININE: 2.63 mg/dL — AB (ref 0.44–1.00)
Calcium: 8.3 mg/dL — ABNORMAL LOW (ref 8.9–10.3)
GFR calc non Af Amer: 20 mL/min — ABNORMAL LOW (ref >60)
GFR, EST AFRICAN AMERICAN: 24 mL/min — AB (ref >60)
Glucose, Bld: 139 mg/dL — ABNORMAL HIGH (ref 65–99)
Potassium: 4 mmol/L (ref 3.5–5.1)
SODIUM: 137 mmol/L (ref 135–145)

## 2015-11-23 LAB — CBC
HEMATOCRIT: 30.9 % — AB (ref 36.0–46.0)
HEMOGLOBIN: 8.4 g/dL — AB (ref 12.0–15.0)
MCH: 25.5 pg — ABNORMAL LOW (ref 26.0–34.0)
MCHC: 27.2 g/dL — AB (ref 30.0–36.0)
MCV: 93.9 fL (ref 78.0–100.0)
Platelets: 241 10*3/uL (ref 150–400)
RBC: 3.29 MIL/uL — AB (ref 3.87–5.11)
RDW: 19.5 % — ABNORMAL HIGH (ref 11.5–15.5)
WBC: 23.9 10*3/uL — AB (ref 4.0–10.5)

## 2015-11-23 LAB — GLUCOSE, CAPILLARY
GLUCOSE-CAPILLARY: 152 mg/dL — AB (ref 65–99)
Glucose-Capillary: 165 mg/dL — ABNORMAL HIGH (ref 65–99)
Glucose-Capillary: 234 mg/dL — ABNORMAL HIGH (ref 65–99)
Glucose-Capillary: 184 mg/dL — ABNORMAL HIGH (ref 65–99)

## 2015-11-23 SURGERY — IRRIGATION AND DEBRIDEMENT EXTREMITY
Anesthesia: Choice | Site: Hand | Laterality: Right

## 2015-11-23 NOTE — Progress Notes (Signed)
[facility] PROGRESS NOTE  [name] ZOX:[id number] DOB: 09-27-1965 DOA: 11/04/2015 PCP: Tonya Mannan, MD  Brief narrative 50 year old female with history of chronic hypotension on midodrine, uncontrolled diabetes with peripheral vascular disease, end-stage renal disease on dialysis (M, W, F), history of staph aureus bacteremia with sepsis 2008, osteomyelitis of multiple digits of her hands and feet status post amputation, GERD, who is wheelchair-bound presented to the ED with fever and infection of her right middle finger. She reported having of fever of 101F at home. In the ED she had a temperature of 99.8 Fahrenheit, hypotensive with blood pressure 73/40 mmHg, normal heart rate and normal O2 sat. She was found to have necrosis of multiple fingertips of both her hands. Lactic acid of 5.74. She received 1 L IV normal saline bolus and IV vancomycin. Patient admitted to hospitalist service on stepdown. Seen by hand surgeon and underwent amputation of left thumb, left index finger, right small finger, right ring finger and right hand incision and drainage of mid palmar space with radical flexor tenosynovectomy, flexor tendon excision and open carpal tunnel release. Also had excisional debridement of partial-thickness skin of right dorsal hand and volar wrist wounds.  Assessment/Plan: Sepsis secondary to multiple infected fingers -went to the OR 4 times with amputation of several digits and finally underwent Right hand transmetacarpal amputation, with digital fillet-flap closure, plans were to go back to OR this am, Pt declined this and wanting more Palliative/comfort based Care -remains on IV Fortaz, Vanc stopped due to hearing loss -Seen by ID [name] -Ortho [name] aware -pain control per [name]  Hypotension Chronic and is on midodrine, BP in 70s-80s -ACTH stim test normal -dont think this is accurate, mentation mostly appropriate but waxes and wanes  Vulvovaginal  candidiasis - s/p Diflucan x 1 time   Hemorrhoids/Rectal pain -chronic hypotension making opiod use complicated -change senokot to BID, add colace too -continue anusol change to QID and proctofoam changed to QID  Resp arrest -in setting of fentanyl and versed in IR on 3/22 -resolved with narcan  End-stage renal disease on dialysis -Renal following and managing.  -BP limiting factor -access isues  Uncontrolled mellitus Continue Lantus with SSI  Peripheral vascular disease Continue aspirin  Hearing loss -pt attributes this to Vancomycin, ongoing for 6-7days  ETHICs: OVERALL prognosis poor, ESRD, Very poor functional status, bed bound for years,  requiring 4 surgeries this admission, Chronic severe hypotension, limiting HD, cardiac arrest this admission. Palliative consulting, greatly appreciate Dr.Goldings help I talked to husband [name] about Poor prognosis and need for Honest Palliative discussion and goals of care and consideration of Hospice. He was very tearful but understanding of her issues Also called and talked to pts father [name] per [facility] request, [name] understands how sick she is and is completely agreeable to Hospice/Palliative discussions. Patient leaning towards comfort care, she is going to d/w her spouse regarding HD   DVT prophylaxis: subcutaneous heparin  Code Status: DNR Family Communication: Spouse [name] Disposition Plan: Keep in SDU   Consultants:  Nephrology  Hand surgery  Procedures:  Patient of multiple fingers, right hand I&D on 3/13  Antibiotics:   IV vancomycin since 3/13  HPI/Subjective: More comfortable today, pain meds helping, declined OR today  Objective: Filed Vitals:   19-Dec-2015 0749 19-Dec-2015 1216  BP: 84/57 66/42  Pulse: 103 74  Temp: 100.5 F (38.1 C)   Resp: 16 8    Intake/Output Summary (Last 24 hours) at 12-19-15 1333 Last data filed at  11/21/2015 1216  Gross per 24 hour  Intake     350 ml  Output      0 ml  Net    350 ml   Filed Weights   11/22/15 0805 11/22/15 1244 11/01/2015 0510  Weight: 108 kg (238 lb 1.6 oz) 107 kg (235 lb 14.3 oz) 110 kg (242 lb 8.1 oz)    Exam:   General:  Middle aged female, awake and alert, chronically ill appearing  HEENT: Moist mucosa, supple neck  Chest: Clear bilaterally, no wheezes  CVS: Normal S1-S2, no murmurs or gallop  GI: Soft, nondistended, nontender, bowel sounds present  Musculoskeletal: Dressing over right hand intact, dressing over left thumb and index finger intact, drain noted  CNS: Alert and awake, moves upper extremities equally   Data Reviewed: Basic Metabolic Panel:  Recent Labs Lab 11/17/15 0206 11/18/15 0917  11/08/2015 1345 11/20/15 1725 11/21/15 0722 11/22/15 0530 11/01/2015 0258  NA 131* 138  --  136 138 139 138 137  K 5.2* 4.6  --  4.1 4.1 4.3 4.3 4.0  CL 94* 96*  --   --  97* 99* 100* 98*  CO2 16* 21*  --   --  24 25 24  21*  GLUCOSE 325* 223*  < > 91 84 70 132* 139*  BUN 83* 55*  --   --  40* 45* 20 15  CREATININE 7.17* 5.67*  --   --  5.17* 5.51* 3.66* 2.63*  CALCIUM 8.3* 8.2*  --   --  8.0* 7.9* 8.1* 8.3*  PHOS 5.7* 3.4  --   --  6.0*  --  4.5  --   < > = values in this interval not displayed. Liver Function Tests:  Recent Labs Lab 11/17/15 0206 11/18/15 0917 11/20/15 1725 11/22/15 0530  ALBUMIN 1.8* 1.7* 1.7* 1.6*   No results for input(s): LIPASE, AMYLASE in the last 168 hours. No results for input(s): AMMONIA in the last 168 hours. CBC:  Recent Labs Lab 11/02/2015 1345 11/20/15 1745 11/21/15 0722 11/22/15 0530 11/15/2015 0258  WBC  --   --  23.7* 25.4* 23.9*  HGB 11.6* 8.0* 8.3* 8.3* 8.4*  HCT 34.0* 29.5* 28.7* 30.7* 30.9*  MCV  --   --  91.7 92.5 93.9  PLT  --   --  246 224 241   Cardiac Enzymes: No results for input(s): CKTOTAL, CKMB, CKMBINDEX, TROPONINI in the last 168 hours. BNP (last 3 results) No results for input(s): BNP in the last 8760 hours.  ProBNP  (last 3 results) No results for input(s): PROBNP in the last 8760 hours.  CBG:  Recent Labs Lab 11/22/15 0744 11/22/15 1638 11/22/15 2204 11/06/2015 0812 11/09/2015 1224  GLUCAP 125* 109* 117* 152* 165*    No results found for this or any previous visit (from the past 240 hour(s)).   Studies: Dg Chest Port 1 View  11/22/2015  CLINICAL DATA:  R06.02 (ICD-10-CM) - SOB (shortness of breath) Today complaining of shortness of breath during dialysis EXAM: PORTABLE CHEST 1 VIEW COMPARISON:  11/20/2015 FINDINGS: Large-bore RIGHT central venous line noted. Stable enlarged cardiac silhouette. No effusion, infiltrate, or pneumothorax. External pads noted. IMPRESSION: Cardiomegaly with no acute cardiopulmonary findings. Electronically Signed   By: Genevive Bi M.D.   On: 11/22/2015 12:26    Scheduled Meds: . acidophilus  1 capsule Oral Daily  . alteplase  4 mg Intracatheter Once  . aspirin EC  81 mg Oral QHS  . bacitracin   Topical Daily  .  calcitRIOL  0.75 mcg Oral Q M,W,F-HD  . cefTAZidime (FORTAZ)  IV  2 g Intravenous Q M,W,F-1800  . cinacalcet  60 mg Oral Q breakfast  . [START ON 11/25/2015] darbepoetin (ARANESP) injection - DIALYSIS  100 mcg Intravenous Q Mon-HD  . [START ON 11/25/2015] ferric gluconate (FERRLECIT/NULECIT) IV  250 mg Intravenous Q M,W,F-HD  . hydrocortisone   Rectal QID  . hydrocortisone-pramoxine  1 applicator Rectal QID  . insulin aspart  0-5 Units Subcutaneous QHS  . insulin aspart  0-9 Units Subcutaneous TID WC  . insulin aspart  4 Units Subcutaneous TID WC  . insulin detemir  20 Units Subcutaneous QHS  . methadone  5 mg Oral Daily  . midodrine  20 mg Oral TID WC  . multivitamin  1 tablet Oral QHS  . pantoprazole  40 mg Oral Daily  . polyethylene glycol  17 g Oral BID  . pregabalin  25 mg Oral Daily  . senna-docusate  2 tablet Oral BID  . sevelamer carbonate  4,000 mg Oral TID WC   Continuous Infusions: . sodium chloride 10 mL/hr at 10/30/2015 1316    Time  spent:35 minutes  Jeannette Maddy  [facility] Pager [phone] If 7PM-7AM, please contact night-coverage at [email or web address], password [email or web address] 11/07/2015, 1:33 PM  LOS: 12 days

## 2015-11-23 NOTE — Progress Notes (Signed)
Pt is refusing to be turned, pt is also refusing cream and foam for rectum at this time. Pt states the dilaudid is helping with rectal pain and she does not need foam or cream.  Consuelo Pandy RN

## 2015-11-23 NOTE — Progress Notes (Signed)
   11/07/2015 1900  Clinical Encounter Type  Visited With Family  Visit Type Spiritual support;Psychological support  Referral From Family;Nurse  Spiritual Encounters  Spiritual Needs Emotional  Stress Factors  Family Stress Factors Loss;Major life changes;Financial concerns;Family relationships;Other (Comment) (power of attorney)  Warren visited with pt husband who is concerned about financial matters and other decisions in regards to hospice transition; Kell recommends talking to Education officer, museum. 7:18 PM Gwynn Burly

## 2015-11-23 NOTE — Progress Notes (Signed)
After speaking with the patient yesterday around 2:45pm regarding plan to return to OR this am for continued RUE infection management, she consented.  Last night, around 9:00, I received a call from the patient's RN indicating that the patient and her husband had re-considered and decided to move in direction of palliative care, without any more efforts to eradicate the infection in her R UE.  I spoke directly with the patient, who confirmed this.  I was blunt regarding the ramifications of this decision, and she confirmed her understanding and her wishes.  I cancelled surgery for today.  She may keep the drain in her RUE, or it can be removed.  To remove it only requires clipping the suture holding it in and then pulling it out.  There is about 10 inches of drain inside the body.    Please re-consult me if goals of care change.  Micheline Rough, MD [facility] [phone]

## 2015-11-23 NOTE — Progress Notes (Signed)
[facility] Progress Note   Subjective: met w pall care, now DNR.  Not sure but thinking about stopping HD vs not  Filed Vitals:   11/22/15 1958 11/22/15 2301 11/29/2015 0510 11/08/2015 0749  BP: '83/51 84/39 75/46 '$ 84/57  Pulse: 109 106 104 103  Temp: 100 F (37.8 C) 98.7 F (37.1 C) 99.9 F (37.7 C) 100.5 F (38.1 C)  TempSrc: Oral Oral Oral Axillary  Resp: '28 25 19 16  '$ Height:      Weight:   110 kg (242 lb 8.1 oz)   SpO2: 100% 100% 99% 99%    Inpatient medications: . acidophilus  1 capsule Oral Daily  . alteplase  4 mg Intracatheter Once  . aspirin EC  81 mg Oral QHS  . bacitracin   Topical Daily  . calcitRIOL  0.75 mcg Oral Q M,W,F-HD  . cefTAZidime (FORTAZ)  IV  2 g Intravenous Q M,W,F-1800  . cinacalcet  60 mg Oral Q breakfast  . [START ON 11/25/2015] darbepoetin (ARANESP) injection - DIALYSIS  100 mcg Intravenous Q Mon-HD  . [START ON 11/25/2015] ferric gluconate (FERRLECIT/NULECIT) IV  250 mg Intravenous Q M,W,F-HD  . hydrocortisone   Rectal QID  . hydrocortisone-pramoxine  1 applicator Rectal QID  . insulin aspart  0-5 Units Subcutaneous QHS  . insulin aspart  0-9 Units Subcutaneous TID WC  . insulin aspart  4 Units Subcutaneous TID WC  . insulin detemir  20 Units Subcutaneous QHS  . methadone  5 mg Oral Daily  . midodrine  20 mg Oral TID WC  . multivitamin  1 tablet Oral QHS  . pantoprazole  40 mg Oral Daily  . polyethylene glycol  17 g Oral BID  . pregabalin  25 mg Oral Daily  . senna-docusate  2 tablet Oral BID  . sevelamer carbonate  4,000 mg Oral TID WC   . sodium chloride 10 mL/hr at 11/10/2015 1316   HYDROmorphone (DILAUDID) injection, lactulose, methadone, ondansetron **OR** ondansetron (ZOFRAN) IV, senna  Exam: Awake and alert, no further invol movement No jvd Chest clear bilat RRR no mrg Abd soft ntnd very obese no ascites Bilat hands wrapped up No LE edema Neuro nonfocal  CXR last admit 3/13 > clear bilat  Dialysis: [facility]  MWF  5h  105kg  2/2.5 bath  Hep 10,000   L thigh AVG Mircera: none Calcitriol: 0.75 ug mwf  Assessment: 1. 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 Cardiac arrest 3/22- narcotic-related 3. Rectal pain/ muscular spasms - rx with valium suppository per pall  care 4. ESRD -MWF HD 5. 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 Chronic hypotension- runs in 70's-80's at OP HD per pt 7. Volume - no vol excess on exam, wt's prob not accurate 8. Involuntary movement - resolved off neurontin  9. 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   Tonya Splinter MD [facility] pager [phone]    cell [phone] 11/20/2015, 5:39 PM    Recent Labs Lab 11/18/15 0917  11/20/15 1725 11/21/15 0722 11/22/15 0530 11/29/2015 0258  NA 138  < > 138 139 138 137  K 4.6  < > 4.1 4.3 4.3 4.0  CL 96*  --  97* 99* 100* 98*  CO2 21*  --  '24 25 24 '$ 21*  GLUCOSE 223*  < > 84 70 132* 139*  BUN 55*  --  40* 45* 20 15  CREATININE 5.67*  --  5.17* 5.51* 3.66* 2.63*  CALCIUM 8.2*  --  8.0* 7.9* 8.1* 8.3*  PHOS 3.4  --  6.0*  --  4.5  --   < > = values in this interval not displayed.  Recent Labs Lab 11/18/15 0917 11/20/15 1725 11/22/15 0530  ALBUMIN 1.7* 1.7* 1.6*    Recent Labs Lab 11/21/15 0722 11/22/15 0530 11/05/2015 0258  WBC 23.7* 25.4* 23.9*  HGB 8.3* 8.3* 8.4*  HCT 28.7* 30.7* 30.9*  MCV 91.7 92.5 93.9  PLT 246 224 241

## 2015-11-24 DIAGNOSIS — K649 Unspecified hemorrhoids: Secondary | ICD-10-CM

## 2015-11-24 LAB — GLUCOSE, CAPILLARY
GLUCOSE-CAPILLARY: 184 mg/dL — AB (ref 65–99)
Glucose-Capillary: 162 mg/dL — ABNORMAL HIGH (ref 65–99)
Glucose-Capillary: 139 mg/dL — ABNORMAL HIGH (ref 65–99)

## 2015-11-24 MED ORDER — HYDROMORPHONE HCL 1 MG/ML IJ SOLN
0.5000 mg | INTRAMUSCULAR | Status: DC | PRN
  Administered 2015-11-24 – 2015-11-25 (×4): 1 mg via INTRAVENOUS
  Administered 2015-11-26: 0.5 mg via INTRAVENOUS
  Filled 2015-11-24 (×6): qty 1

## 2015-11-24 NOTE — Progress Notes (Addendum)
[facility] PROGRESS NOTE  AISLING KLEPPINGER UJW:[id number] DOB: 1966/08/13 DOA: 11/20/2015 PCP: Ruthe Mannan, MD  Brief narrative 50 year old female with history of chronic hypotension on midodrine, uncontrolled diabetes with peripheral vascular disease, end-stage renal disease on dialysis (M, W, F), history of staph aureus bacteremia with sepsis 2008, osteomyelitis of multiple digits of her hands and feet status post amputation, GERD, who is wheelchair-bound presented to the ED with fever and infection of her right middle finger. She reported having of fever of 101F at home. In the ED she had a temperature of 99.8 Fahrenheit, hypotensive with blood pressure 73/40 mmHg, normal heart rate and normal O2 sat. She was found to have necrosis of multiple fingertips of both her hands. Lactic acid of 5.74. She received 1 L IV normal saline bolus and IV vancomycin. Patient admitted to hospitalist service on stepdown. Seen by hand surgeon and underwent amputation of left thumb, left index finger, right small finger, right ring finger and right hand incision and drainage of mid palmar space with radical flexor tenosynovectomy, flexor tendon excision and open carpal tunnel release. Also had excisional debridement of partial-thickness skin of right dorsal hand and volar wrist wounds. Declining overall, Palliaive consulting, now DNR and mainly comfort focused care, having discussions abt HD  Assessment/Plan: Sepsis secondary to multiple infected fingers -went to the OR 4 times with amputation of several digits and finally underwent Right hand transmetacarpal amputation, with digital fillet-flap closure, plans were to go back to OR 3/25 Pt declined this and wanting more Palliative/comfort based Care -remains on IV Fortaz, Vanc stopped due to hearing loss -Seen by ID Dr.COmer -Ortho Dr.Thompson aware -pain control per Dr.Golding, will increase dilaudid dose a little -mostly comfort care but hasnt stopped HD  yet, i recommended stopping HD  Hypotension Chronic and is on midodrine, BP in 70s-80s -ACTH stim test normal -dont think this is accurate, mentation mostly appropriate but waxes and wanes  Vulvovaginal candidiasis - s/p Diflucan x 1 time   Hemorrhoids/Rectal pain -chronic hypotension making opiod use complicated -change senokot to BID, add colace too -continue anusol change to QID and proctofoam changed to QID  Resp arrest -in setting of fentanyl and versed in IR on 3/22 -resolved with narcan  End-stage renal disease on dialysis -Renal following and managing.  -BP limiting factor -access isues  Uncontrolled mellitus Continue Lantus with SSI  Peripheral vascular disease Continue aspirin  Hearing loss -pt attributes this to Vancomycin, ongoing for 6-7days  ETHICs: OVERALL prognosis poor, ESRD, Very poor functional status, bed bound for years,  requiring 4 surgeries this admission, Chronic severe hypotension, limiting HD, cardiac arrest this admission. Palliative consulting, greatly appreciate Dr.Goldings help Husband Mellody Dance and Father [name] aware of very poor prognosis Patient talks about wanting hospice, unable to discuss further this am since somnolent after pain meds, clearly leaning towards comfort care,i recommended stopping HD to her husband, but he still thinks she should continue this, will need further discussions, BP too soft, Palliative following. I wouldn't be surprised if she passes away in 24-48hours  DVT prophylaxis: subcutaneous heparin  Code Status: DNR Family Communication: Spouse Mellody Dance [place] Disposition Plan: Keep in SDU   Consultants:  Nephrology  Hand surgery  Procedures:  Patient of multiple fingers, right hand I&D on 3/13  Antibiotics:   IV vancomycin since 3/13  HPI/Subjective: More comfortable today, pain meds helping, declined OR today  Objective: Filed Vitals:   11/24/15 0704 11/24/15 0826  BP: 63/37   Pulse: 65   Temp:  96.5 F (35.8 C)  Resp: 13     Intake/Output Summary (Last 24 hours) at 11/24/15 1051 Last data filed at 11/24/15 0900  Gross per 24 hour  Intake    180 ml  Output      0 ml  Net    180 ml   Filed Weights   11/22/15 0805 11/22/15 1244 12-04-2015 0510  Weight: 108 kg (238 lb 1.6 oz) 107 kg (235 lb 14.3 oz) 110 kg (242 lb 8.1 oz)    Exam:   General:  Middle aged female, awake and alert, chronically ill appearing  HEENT: Moist mucosa, supple neck  Chest: Clear bilaterally, no wheezes  CVS: Normal S1-S2, no murmurs or gallop  GI: Soft, nondistended, nontender, bowel sounds present  Musculoskeletal: Dressing over right hand intact, dressing over left thumb and index finger intact, drain noted  CNS: Alert and awake, moves upper extremities equally   Data Reviewed: Basic Metabolic Panel:  Recent Labs Lab 11/18/15 0917  11/24/2015 1345 11/20/15 1725 11/21/15 0722 11/22/15 0530 Dec 04, 2015 0258  NA 138  --  136 138 139 138 137  K 4.6  --  4.1 4.1 4.3 4.3 4.0  CL 96*  --   --  97* 99* 100* 98*  CO2 21*  --   --  24 25 24  21*  GLUCOSE 223*  < > 91 84 70 132* 139*  BUN 55*  --   --  40* 45* 20 15  CREATININE 5.67*  --   --  5.17* 5.51* 3.66* 2.63*  CALCIUM 8.2*  --   --  8.0* 7.9* 8.1* 8.3*  PHOS 3.4  --   --  6.0*  --  4.5  --   < > = values in this interval not displayed. Liver Function Tests:  Recent Labs Lab 11/18/15 0917 11/20/15 1725 11/22/15 0530  ALBUMIN 1.7* 1.7* 1.6*   No results for input(s): LIPASE, AMYLASE in the last 168 hours. No results for input(s): AMMONIA in the last 168 hours. CBC:  Recent Labs Lab 11/13/2015 1345 11/20/15 1745 11/21/15 0722 11/22/15 0530 Dec 04, 2015 0258  WBC  --   --  23.7* 25.4* 23.9*  HGB 11.6* 8.0* 8.3* 8.3* 8.4*  HCT 34.0* 29.5* 28.7* 30.7* 30.9*  MCV  --   --  91.7 92.5 93.9  PLT  --   --  246 224 241   Cardiac Enzymes: No results for input(s): CKTOTAL, CKMB, CKMBINDEX, TROPONINI in the last 168 hours. BNP (last  3 results) No results for input(s): BNP in the last 8760 hours.  ProBNP (last 3 results) No results for input(s): PROBNP in the last 8760 hours.  CBG:  Recent Labs Lab 12-04-15 0812 12-04-2015 1224 12-04-2015 1729 12-04-2015 2136 11/24/15 0824  GLUCAP 152* 165* 234* 184* 184*    No results found for this or any previous visit (from the past 240 hour(s)).   Studies: Dg Chest Port 1 View  11/22/2015  CLINICAL DATA:  R06.02 (ICD-10-CM) - SOB (shortness of breath) Today complaining of shortness of breath during dialysis EXAM: PORTABLE CHEST 1 VIEW COMPARISON:  11/20/2015 FINDINGS: Large-bore RIGHT central venous line noted. Stable enlarged cardiac silhouette. No effusion, infiltrate, or pneumothorax. External pads noted. IMPRESSION: Cardiomegaly with no acute cardiopulmonary findings. Electronically Signed   By: Genevive Bi M.D.   On: 11/22/2015 12:26    Scheduled Meds: . acidophilus  1 capsule Oral Daily  . alteplase  4 mg Intracatheter Once  . aspirin EC  81 mg Oral  QHS  . bacitracin   Topical Daily  . calcitRIOL  0.75 mcg Oral Q M,W,F-HD  . cefTAZidime (FORTAZ)  IV  2 g Intravenous Q M,W,F-1800  . cinacalcet  60 mg Oral Q breakfast  . [START ON 11/25/2015] darbepoetin (ARANESP) injection - DIALYSIS  100 mcg Intravenous Q Mon-HD  . [START ON 11/25/2015] ferric gluconate (FERRLECIT/NULECIT) IV  250 mg Intravenous Q M,W,F-HD  . hydrocortisone   Rectal QID  . hydrocortisone-pramoxine  1 applicator Rectal QID  . insulin aspart  0-5 Units Subcutaneous QHS  . insulin aspart  0-9 Units Subcutaneous TID WC  . insulin aspart  4 Units Subcutaneous TID WC  . insulin detemir  20 Units Subcutaneous QHS  . methadone  5 mg Oral Daily  . midodrine  20 mg Oral TID WC  . multivitamin  1 tablet Oral QHS  . pantoprazole  40 mg Oral Daily  . polyethylene glycol  17 g Oral BID  . pregabalin  25 mg Oral Daily  . senna-docusate  2 tablet Oral BID  . sevelamer carbonate  4,000 mg Oral TID WC    Continuous Infusions: . sodium chloride 20 mL/hr at 10/31/2015 2000    Time spent:35 minutes  Gem Conkle  [facility] Pager [phone] If 7PM-7AM, please contact night-coverage at [email or web address], password [facility] 11/24/2015, 10:51 AM  LOS: 13 days

## 2015-11-24 NOTE — Progress Notes (Signed)
Pt will allow slight turn with pillow under hip, legs on pillows and elbows on pillows. Pt refuses proctofoam and anusol at this time. Consuelo Pandy RN

## 2015-11-24 NOTE — Progress Notes (Signed)
[facility] Progress Note   Subjective: getting more IV pain meds through the night, less responsive today  Filed Vitals:   11/24/15 0222 11/24/15 0704 11/24/15 0826 11/24/15 1215  BP: 80/50 63/37    Pulse: 64 65    Temp: 98 F (36.7 C)  96.5 F (35.8 C) 97 F (36.1 C)  TempSrc: Axillary  Axillary Axillary  Resp: 13 13    Height:      Weight:      SpO2: 100% 100%      Inpatient medications: . acidophilus  1 capsule Oral Daily  . alteplase  4 mg Intracatheter Once  . aspirin EC  81 mg Oral QHS  . bacitracin   Topical Daily  . calcitRIOL  0.75 mcg Oral Q M,W,F-HD  . cefTAZidime (FORTAZ)  IV  2 g Intravenous Q M,W,F-1800  . cinacalcet  60 mg Oral Q breakfast  . [START ON 11/25/2015] darbepoetin (ARANESP) injection - DIALYSIS  100 mcg Intravenous Q Mon-HD  . [START ON 11/25/2015] ferric gluconate (FERRLECIT/NULECIT) IV  250 mg Intravenous Q M,W,F-HD  . hydrocortisone   Rectal QID  . hydrocortisone-pramoxine  1 applicator Rectal QID  . insulin aspart  0-5 Units Subcutaneous QHS  . insulin aspart  0-9 Units Subcutaneous TID WC  . insulin aspart  4 Units Subcutaneous TID WC  . insulin detemir  20 Units Subcutaneous QHS  . methadone  5 mg Oral Daily  . midodrine  20 mg Oral TID WC  . multivitamin  1 tablet Oral QHS  . pantoprazole  40 mg Oral Daily  . polyethylene glycol  17 g Oral BID  . pregabalin  25 mg Oral Daily  . senna-docusate  2 tablet Oral BID  . sevelamer carbonate  4,000 mg Oral TID WC   . sodium chloride 20 mL/hr at 11/17/2015 2000   HYDROmorphone (DILAUDID) injection, lactulose, methadone, ondansetron **OR** ondansetron (ZOFRAN) IV, senna  Exam: Awake and alert, no further invol movement No jvd Chest clear bilat RRR no mrg Abd soft ntnd very obese no ascites Bilat hands wrapped up No LE edema Neuro nonfocal  CXR last admit 3/13 > clear bilat  Dialysis: [facility] MWF  5h  105kg  2/2.5 bath  Hep 10,000   L thigh AVG Mircera:  none Calcitriol: 0.75 ug mwf  Assessment: 1. 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 Cardiac arrest 3/22- narcotic-related 3. ESRD -MWF HD 4. 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 Chronic hypotension- runs in 70's-80's at OP HD 6. Volume - no vol excess on exam, wt's are off 7. Involuntary movement - resolved off neurontin  8. Hearing loss/tinnitus - due to vancomycin, stopped > 1 wk ago 9. DNR - getting more and more pain medication  Plan - agree poor candidate for further dialysis. Have d/w husband, he is planning to talk w pall care about dc to hospice care facility tomorrow.  No dialysis.   Delila Splinter MD [facility] pager [phone]    cell [phone] 11/20/2015, 5:39 PM    Recent Labs Lab 11/18/15 0917  11/20/15 1725 11/21/15 0722 11/22/15 0530 11/01/2015 0258  NA 138  < > 138 139 138 137  K 4.6  < > 4.1 4.3 4.3 4.0  CL 96*  --  97* 99* 100* 98*  CO2 21*  --  24 25 24  21*  GLUCOSE 223*  < > 84 70 132* 139*  BUN 55*  --  40* 45*  20 15  CREATININE 5.67*  --  5.17* 5.51* 3.66* 2.63*  CALCIUM 8.2*  --  8.0* 7.9* 8.1* 8.3*  PHOS 3.4  --  6.0*  --  4.5  --   < > = values in this interval not displayed.  Recent Labs Lab 11/18/15 0917 11/20/15 1725 11/22/15 0530  ALBUMIN 1.7* 1.7* 1.6*    Recent Labs Lab 11/21/15 0722 11/22/15 0530 11/09/2015 0258  WBC 23.7* 25.4* 23.9*  HGB 8.3* 8.3* 8.4*  HCT 28.7* 30.7* 30.9*  MCV 91.7 92.5 93.9  PLT 246 224 241

## 2015-11-24 NOTE — Progress Notes (Signed)
RN engaged in conversation with patients husband regarding missing belongings. Husband Lanny Hurst informed RN that he had patients hair washing accessories in a trash bag placed within a trash can brought from home. Upon further assessment this evening he noticed that belongings in question were missing and he was visibly distraught. I told Lanny Hurst that I would pass this information along to my director and she would be in touch.

## 2015-11-25 DIAGNOSIS — N186 End stage renal disease: Secondary | ICD-10-CM

## 2015-11-25 LAB — GLUCOSE, CAPILLARY
GLUCOSE-CAPILLARY: 142 mg/dL — AB (ref 65–99)
GLUCOSE-CAPILLARY: 148 mg/dL — AB (ref 65–99)
Glucose-Capillary: 170 mg/dL — ABNORMAL HIGH (ref 65–99)

## 2015-11-25 MED ORDER — METHADONE HCL 5 MG PO TABS: 2.5000 mg | ORAL_TABLET | ORAL | Status: AC | PRN

## 2015-11-25 MED ORDER — HYDROCORTISONE 2.5 % RE CREA: TOPICAL_CREAM | Freq: Two times a day (BID) | RECTAL | Status: AC

## 2015-11-25 MED ORDER — POLYETHYLENE GLYCOL 3350 17 G PO PACK: 17.0000 g | PACK | Freq: Two times a day (BID) | ORAL | Status: AC

## 2015-11-25 NOTE — Progress Notes (Signed)
   11/25/15 1200  Clinical Encounter Type  Visited With Family  Visit Type Spiritual support;Psychological support;Social support  Referral From Patient  Spiritual Encounters  Spiritual Needs Emotional  Stress Factors  Family Stress Factors Loss;Lack of knowledge;Health changes;Financial concerns  [place] asked to offer additional information and support; pt husband in poor emotional state, he is looking for answers regarding estate and other financial concerns; best resource is Education officer, museum here at [facility] or at hospice post d/c. Gwynn Burly 12:12 PM

## 2015-11-25 NOTE — Progress Notes (Signed)
Patient sleeping, resting comfortably, so not awakened by me.  Husband [name] says her rest was fairly good overnight once IV was replaced and medicines could be given.  [name] says that drain in R forearm doesn't seem to be bothering her, so it can stay for now.  Will try to stop by later today.

## 2015-11-25 NOTE — Progress Notes (Signed)
Plans noted for transfer to hospice facility-referrals have been made. She has a high symptom burden requiring very frequent PRN dosing of IV Hydromorphone for pain-needs will escalate as she approaches EOL. Prognosis Hours to several days. Last HD was 3/24. Very appropriate for facility based inpatient hospice care. Appreciate support of the entire team in caring for [name]. Please call the palliative team if we can be of further assistance.  Lane Hacker, DO Palliative Medicine [phone]

## 2015-11-25 NOTE — Discharge Summary (Signed)
Physician Discharge Summary  [name] HYQ:[id number] DOB: [birth year] DOA: 11/07/2015  PCP: Ruthe Mannan, MD  Admit date: 10/31/2015 Discharge date: 11/25/2015  Time spent: 45 minutes  Recommendations for Outpatient Follow-up:  1. 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)   MORBID OBESITY   UNSPECIFIED HYPOTENSION   Secondary renal hyperparathyroidism (HCC)   ESRD (end stage renal disease) (HCC)   Diabetes (HCC)   Dependent for wheelchair mobility   Infected finger   Cellulitis of left hand   Vulvovaginal candidiasis   Hemorrhoids   Necrotic hand wound   Rectal pain   Rectal bleeding   Palliative care encounter   Hand pain, right   Discharge Condition: Poor  Diet recommendation: comfort feeds  Filed Weights   11/22/15 0805 11/22/15 1244 11/10/2015 0510  Weight: 108 kg (238 lb 1.6 oz) 107 kg (235 lb 14.3 oz) 110 kg (242 lb 8.1 oz)    History of present illness:  Chief Complaint: fever wound infection HPI: [name] is a very pleasant 50 y.o. 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 Initial evaluation in the emergency department reveals sepsis likely related to wound infection on right middle finger. She reported seeing her orthopedic surgeon 4 days ago in the office which time they had plan to schedule surgery for amputation of first digit middle finger right hand. Over the weekend wound gradually became more swollen with  increased erythema heat pain and malodorous drainage.  Hospital Course:  Sepsis secondary to multiple infected fingers -went to the OR 4 times with amputation of several digits and finally underwent Right hand transmetacarpal amputation, with digital fillet-flap closure, plans were to go back to OR 3/25 Pt declined this and wanting more Palliative/comfort based Care -was on IV vanc and fortaz and Vanc stopped a week ago due to hearing loss -Seen by ID Dr.COmer and followed by Ortho Dr.Thompson . -seen by Palliative medicine team dr.Golding, decision, after ongoing discussions by myself, Dr.Schertz and dr.Goding, now decision made for Comfort care, stop hemodialysis and [facility] for End of life care  Hypotension Chronic and is on midodrine, BP in 70s-80s -ACTH stim test normal -exacerbated in setting of infection/pain meds  Vulvovaginal candidiasis - s/p Diflucan x 1 time   Hemorrhoids/Rectal pain -continue laxatives -continue anusol change to QID and proctofoam changed to QID  Resp arrest -in setting of fentanyl and versed in IR on 3/22 -resolved with narcan  End-stage renal disease on dialysis -Renal following and managing.  -BP limiting factor -access isues  Uncontrolled mellitus Continue Lantus with SSI  Peripheral vascular disease Continue aspirin  Hearing loss -pt attributes this to Vancomycin, ongoing for 6-7days  ETHICs: OVERALL prognosis poor, ESRD, Very poor functional status, bed bound for years, requiring 4 surgeries this admission, Chronic severe hypotension, limiting HD, cardiac arrest this admission. Palliative consulted, greatly appreciate Dr.Goldings help, Husband Mellody Dance and Father [name] aware of very poor prognosis. Finally decision made to stop HD yesterday and plan for [facility]idential [facility]  Consultations:  Palliative medicine  Renal  Hand surgery  Discharge Exam: Filed Vitals:   11/25/15 0933 11/25/15 1148  BP:  71/47  Pulse:  78   Temp: 98.6 F (37 C) 98.6 F (37 C)  Resp:  22    General: AAOx3 Cardiovascular: S1S2/RRR Respiratory: CTAB  Discharge Instructions   Discharge Instructions    Discharge instructions    Complete by:  As directed   Comfort feeds     Increase activity slowly    Complete by:  As directed           Current Discharge Medication List    START taking these medications   Details  hydrocortisone (ANUSOL-HC) 2.5 % rectal cream Place rectally 2 (two) times daily. Qty: 30 g, Refills: 0    methadone (DOLOPHINE) 5 MG tablet Take 0.5 tablets (2.5 mg total) by mouth every 4 (four) hours as needed for moderate pain. Qty: 30 tablet, Refills: 0    polyethylene glycol (MIRALAX / GLYCOLAX) packet Take 17 g by mouth 2 (two) times daily. Qty: 14 each, Refills: 0      CONTINUE these medications which have NOT CHANGED   Details  aspirin EC 81 MG tablet Take 81 mg by mouth at bedtime.    cinacalcet (SENSIPAR) 60 MG tablet Take 60 mg by mouth 2 (two) times daily.     midodrine (PROAMATINE) 10 MG tablet Take 20 mg by mouth 3 (three) times daily. Take 2 tablets before dialysis, 2 tablets mid-way through dialysis, and 2 tablets in the evening    omeprazole (PRILOSEC) 20 MG capsule Take 20 mg by mouth at bedtime.       STOP taking these medications     doxycycline (VIBRA-TABS) 100 MG tablet      gabapentin (NEURONTIN) 100 MG tablet      insulin aspart (NOVOLOG FLEXPEN) 100 UNIT/ML FlexPen      LEVEMIR FLEXTOUCH 100 UNIT/ML Pen      Multiple Vitamin (RENAL MULTIVITAMIN/ZINC) TABS      Probiotic Product (ALIGN PO)      sevelamer carbonate (RENVELA) 800 MG tablet        Allergies  Allergen Reactions  . Benadryl [Diphenhydramine Hcl (Sleep)] Other (See Comments)    HYPOTENSION  . Vancomycin Other (See Comments)    Hearing loss  . Wellbutrin [Bupropion] Other (See Comments)    HALLUCINATIONS "Bites Nails"   . Zosyn [Piperacillin Sod-Tazobactam So] Anxiety and Other (See  Comments)    Passed out; no throat swelling  . Morphine Other (See Comments)    UNSPECIFIED Rx During Surgery  . Tylenol [Acetaminophen] Other (See Comments)    HYPOTENSION  . Amitriptyline Other (See Comments)    Sleepy  . Percocet [Oxycodone-Acetaminophen] Nausea And Vomiting      The results of significant diagnostics from this hospitalization (including imaging, microbiology, ancillary and laboratory) are listed below for reference.    Significant Diagnostic Studies: Ir Fluoro Guide Cv Line Right  11/20/2015  CLINICAL DATA:  Occluded dialysis graft. End-stage renal disease, needs access for hemodialysis. COMPARISON:  None EXAM: EXAM DIALYSIS GRAFT DECLOT (ATTEMPTED) VENOUS ANGIOPLASTY ULTRASOUND GUIDANCE FOR VASCULAR ACCESS X2 NON TUNNELED CENTRAL VENOUS CATHETER PLACEMENT UNDER FLUOROSCOPY TECHNIQUE: The procedure, risks (including but not limited to bleeding, infection, organ damage ), benefits, and alternatives were explained to the patient and spouse. Questions regarding the procedure were encouraged and answered. The spouse understands and consents to the procedure. Intravenous Fentanyl and Versed were administered as conscious sedation during continuous monitoring of the patient's level of consciousness and physiological / cardiorespiratory  status by the radiology RN, with a total moderate sedation time of 12 minutes. The arterial limb of the graft was accessed antegrade with a 21-gauge micropuncture needle under real-time ultrasonic guidance after the overlying skin prepped with Betadine, draped in usual sterile fashion, infiltrated locally with 1% lidocaine. Needle exchanged over 018 guidewire for transitional dilator through which 4 mg t-PA was administered. Ultrasound imaging documentation was saved. Through the dilator, a Bentson wire was advanced to the venous anastomosis. Over this a 39F sheath was placed, through which a 5 [place] Kumpe catheter was advanced for outflow venography.  This showed patency of the outflow venous system through the IVC. 3000 units heparin were administered. The Arrow PTD device was used to macerate thrombus in the graft with 2 passes. Gentle hand injections showed residual stenosis in the arterial limb near the apex of the graft. This was dilated using a 7 mm x 4 cm Conquest angioplasty balloon with good response. Antegrade contrast flow was noted with gentle hand injection. Attention was then turned to the venous limb for retrograde access. Patient became apneic with low heart rate, no palpable pulse. CPR was initiated. Sedation reversal agents were administered. Patient regained spontaneous breathing and palpable pulse. Blood pressure regained preprocedure value. The declot procedure was terminated, the sheath removed and hemostasis achieved there with a 3-0 Monocryl suture. The patient's spouse was updated with the events. Attention was turned to placing a temporary dialysis catheter. Patency of the right IJ vein was confirmed with ultrasound with image documentation. An appropriate skin site was determined. Skin site was marked. Region was prepped using maximum barrier technique including cap and mask, sterile gown, sterile gloves, large sterile sheet, and Chlorhexidine as cutaneous antisepsis. The region was infiltrated locally with 1% lidocaine. Under real-time ultrasound guidance, the right IJ vein was accessed with a 19 gauge needle; the needle tip within the vein was confirmed with ultrasound image documentation. The needle exchanged over a guidewire for vascular dilator which allowed advancement of a 20 cm Trialysis catheter. This was positioned with the tip at the cavoatrial junction. Spot chest radiograph shows good positioning and no pneumothorax. Catheter was flushed and sutured externally with 0-Prolene sutures. Patient tolerated the procedure well. FLUOROSCOPY TIME:  1.9 minutes, 140 uGym2 DAP COMPLICATIONS: COMPLICATIONS Apnea, requiring reversal of  sedation agents. SIR level B: Nominal therapy (including overnight admission for observation), no consequence. IMPRESSION: 1. Incomplete declot of left thigh synthetic hemodialysis graft, due to sedation related complication. She can return for completion of this procedure electively. 2. Technically successful balloon angioplasty of intragraft stenosis. 3. Technically successful right IJ 20 cm trialysis hemodialysis catheter placement. ACCESS: Remains approachable for percutaneous intervention as needed. Electronically Signed   By: Corlis Leak M.D.   On: 11/20/2015 17:22   Ir US Guide Vasc Access Left  11/20/2015  CLINICAL DATA:  Occluded dialysis graft. End-stage renal disease, needs access for hemodialysis. COMPARISON:  None EXAM: EXAM DIALYSIS GRAFT DECLOT (ATTEMPTED) VENOUS ANGIOPLASTY ULTRASOUND GUIDANCE FOR VASCULAR ACCESS X2 NON TUNNELED CENTRAL VENOUS CATHETER PLACEMENT UNDER FLUOROSCOPY TECHNIQUE: The procedure, risks (including but not limited to bleeding, infection, organ damage ), benefits, and alternatives were explained to the patient and spouse. Questions regarding the procedure were encouraged and answered. The spouse understands and consents to the procedure. Intravenous Fentanyl and Versed were administered as conscious sedation during continuous monitoring of the patient's level of consciousness and physiological / cardiorespiratory status by the radiology RN, with a total moderate sedation time of 12 minutes. The  arterial limb of the graft was accessed antegrade with a 21-gauge micropuncture needle under real-time ultrasonic guidance after the overlying skin prepped with Betadine, draped in usual sterile fashion, infiltrated locally with 1% lidocaine. Needle exchanged over 018 guidewire for transitional dilator through which 4 mg t-PA was administered. Ultrasound imaging documentation was saved. Through the dilator, a Bentson wire was advanced to the venous anastomosis. Over this a 60F sheath  was placed, through which a 5 [place] Kumpe catheter was advanced for outflow venography. This showed patency of the outflow venous system through the IVC. 3000 units heparin were administered. The Arrow PTD device was used to macerate thrombus in the graft with 2 passes. Gentle hand injections showed residual stenosis in the arterial limb near the apex of the graft. This was dilated using a 7 mm x 4 cm Conquest angioplasty balloon with good response. Antegrade contrast flow was noted with gentle hand injection. Attention was then turned to the venous limb for retrograde access. Patient became apneic with low heart rate, no palpable pulse. CPR was initiated. Sedation reversal agents were administered. Patient regained spontaneous breathing and palpable pulse. Blood pressure regained preprocedure value. The declot procedure was terminated, the sheath removed and hemostasis achieved there with a 3-0 Monocryl suture. The patient's spouse was updated with the events. Attention was turned to placing a temporary dialysis catheter. Patency of the right IJ vein was confirmed with ultrasound with image documentation. An appropriate skin site was determined. Skin site was marked. Region was prepped using maximum barrier technique including cap and mask, sterile gown, sterile gloves, large sterile sheet, and Chlorhexidine as cutaneous antisepsis. The region was infiltrated locally with 1% lidocaine. Under real-time ultrasound guidance, the right IJ vein was accessed with a 19 gauge needle; the needle tip within the vein was confirmed with ultrasound image documentation. The needle exchanged over a guidewire for vascular dilator which allowed advancement of a 20 cm Trialysis catheter. This was positioned with the tip at the cavoatrial junction. Spot chest radiograph shows good positioning and no pneumothorax. Catheter was flushed and sutured externally with 0-Prolene sutures. Patient tolerated the procedure well. FLUOROSCOPY  TIME:  1.9 minutes, 140 uGym2 DAP COMPLICATIONS: COMPLICATIONS Apnea, requiring reversal of sedation agents. SIR level B: Nominal therapy (including overnight admission for observation), no consequence. IMPRESSION: 1. Incomplete declot of left thigh synthetic hemodialysis graft, due to sedation related complication. She can return for completion of this procedure electively. 2. Technically successful balloon angioplasty of intragraft stenosis. 3. Technically successful right IJ 20 cm trialysis hemodialysis catheter placement. ACCESS: Remains approachable for percutaneous intervention as needed. Electronically Signed   By: Corlis Leak M.D.   On: 11/20/2015 17:22   Ir US Guide Vasc Access Right  11/20/2015  CLINICAL DATA:  Occluded dialysis graft. End-stage renal disease, needs access for hemodialysis. COMPARISON:  None EXAM: EXAM DIALYSIS GRAFT DECLOT (ATTEMPTED) VENOUS ANGIOPLASTY ULTRASOUND GUIDANCE FOR VASCULAR ACCESS X2 NON TUNNELED CENTRAL VENOUS CATHETER PLACEMENT UNDER FLUOROSCOPY TECHNIQUE: The procedure, risks (including but not limited to bleeding, infection, organ damage ), benefits, and alternatives were explained to the patient and spouse. Questions regarding the procedure were encouraged and answered. The spouse understands and consents to the procedure. Intravenous Fentanyl and Versed were administered as conscious sedation during continuous monitoring of the patient's level of consciousness and physiological / cardiorespiratory status by the radiology RN, with a total moderate sedation time of 12 minutes. The arterial limb of the graft was accessed antegrade with a 21-gauge micropuncture needle under real-time  ultrasonic guidance after the overlying skin prepped with Betadine, draped in usual sterile fashion, infiltrated locally with 1% lidocaine. Needle exchanged over 018 guidewire for transitional dilator through which 4 mg t-PA was administered. Ultrasound imaging documentation was saved. Through  the dilator, a Bentson wire was advanced to the venous anastomosis. Over this a 53F sheath was placed, through which a 5 [place] Kumpe catheter was advanced for outflow venography. This showed patency of the outflow venous system through the IVC. 3000 units heparin were administered. The Arrow PTD device was used to macerate thrombus in the graft with 2 passes. Gentle hand injections showed residual stenosis in the arterial limb near the apex of the graft. This was dilated using a 7 mm x 4 cm Conquest angioplasty balloon with good response. Antegrade contrast flow was noted with gentle hand injection. Attention was then turned to the venous limb for retrograde access. Patient became apneic with low heart rate, no palpable pulse. CPR was initiated. Sedation reversal agents were administered. Patient regained spontaneous breathing and palpable pulse. Blood pressure regained preprocedure value. The declot procedure was terminated, the sheath removed and hemostasis achieved there with a 3-0 Monocryl suture. The patient's spouse was updated with the events. Attention was turned to placing a temporary dialysis catheter. Patency of the right IJ vein was confirmed with ultrasound with image documentation. An appropriate skin site was determined. Skin site was marked. Region was prepped using maximum barrier technique including cap and mask, sterile gown, sterile gloves, large sterile sheet, and Chlorhexidine as cutaneous antisepsis. The region was infiltrated locally with 1% lidocaine. Under real-time ultrasound guidance, the right IJ vein was accessed with a 19 gauge needle; the needle tip within the vein was confirmed with ultrasound image documentation. The needle exchanged over a guidewire for vascular dilator which allowed advancement of a 20 cm Trialysis catheter. This was positioned with the tip at the cavoatrial junction. Spot chest radiograph shows good positioning and no pneumothorax. Catheter was flushed and  sutured externally with 0-Prolene sutures. Patient tolerated the procedure well. FLUOROSCOPY TIME:  1.9 minutes, 140 uGym2 DAP COMPLICATIONS: COMPLICATIONS Apnea, requiring reversal of sedation agents. SIR level B: Nominal therapy (including overnight admission for observation), no consequence. IMPRESSION: 1. Incomplete declot of left thigh synthetic hemodialysis graft, due to sedation related complication. She can return for completion of this procedure electively. 2. Technically successful balloon angioplasty of intragraft stenosis. 3. Technically successful right IJ 20 cm trialysis hemodialysis catheter placement. ACCESS: Remains approachable for percutaneous intervention as needed. Electronically Signed   By: Corlis Leak M.D.   On: 11/20/2015 17:22   Dg Chest Port 1 View  11/22/2015  CLINICAL DATA:  R06.02 (ICD-10-CM) - SOB (shortness of breath) Today complaining of shortness of breath during dialysis EXAM: PORTABLE CHEST 1 VIEW COMPARISON:  11/20/2015 FINDINGS: Large-bore RIGHT central venous line noted. Stable enlarged cardiac silhouette. No effusion, infiltrate, or pneumothorax. External pads noted. IMPRESSION: Cardiomegaly with no acute cardiopulmonary findings. Electronically Signed   By: Genevive Bi M.D.   On: 11/22/2015 12:26   Dg Chest Port 1 View  11/20/2015  CLINICAL DATA:  Status post hemodialysis catheter placement EXAM: PORTABLE CHEST 1 VIEW COMPARISON:  11/22/2015 FINDINGS: There is a dual-lumen right-sided hemodialysis catheter with the tip projecting over the cavoatrial junction. There is no focal parenchymal opacity. There is no pleural effusion or pneumothorax. The heart and mediastinal contours are unremarkable. The osseous structures are unremarkable. IMPRESSION: Dual-lumen right-sided hemodialysis catheter with the tip projecting over the cavoatrial junction.  Electronically Signed   By: Elige Ko   On: 11/20/2015 18:45   Dg Chest Port 1 View  11/29/2015  CLINICAL DATA:   Scheduled for amputation of infected fingers tomorrow, patient hypotensive and febrile to 101 degrees EXAM: PORTABLE CHEST 1 VIEW COMPARISON:  06/27/2015 FINDINGS: Moderate cardiac silhouette enlargement. Mild diffuse interstitial prominence with no definite pulmonary edema. No consolidation or effusion. IMPRESSION: No active disease. Electronically Signed   By: Esperanza Heir M.D.   On: 10/31/2015 14:38   Dg Hand Complete Right  11/25/2015  CLINICAL DATA:  Pain and swelling for 6 days. EXAM: RIGHT HAND - COMPLETE 3+ VIEW COMPARISON:  04/25/2015 FINDINGS: Diffuse and significant soft tissue swelling/edema involving the entire hand. Suspect areas of gas in the soft tissues. Prior amputations are noted involving the distal phalanges. It appears that there may be none or very little skin coverage over the distal aspect of the middle phalanx of the fourth finger. The distal phalanx is dislocated. No obvious new/ acute destructive bony changes to suggest osteomyelitis. IMPRESSION: 1. Diffuse and marked soft tissue swelling/edema and possible gas in the soft tissues. 2. Palmar dislocation involving the distal phalanx of the fourth finger. It appears that the distal aspect of the middle phalanx is not covered by soft tissue/skin. Septic arthritis at the interphalangeal joint is possible cause. 3. No obvious destructive bony changes. Remote amputations are noted. Electronically Signed   By: Rudie Meyer M.D.   On: 10/31/2015 12:15   Ir Thrombectomy Av Fistula W/thrombolysis/pta/stent Inc Shunt Img Lt  11/20/2015  CLINICAL DATA:  Occluded dialysis graft. End-stage renal disease, needs access for hemodialysis. COMPARISON:  None EXAM: EXAM DIALYSIS GRAFT DECLOT (ATTEMPTED) VENOUS ANGIOPLASTY ULTRASOUND GUIDANCE FOR VASCULAR ACCESS X2 NON TUNNELED CENTRAL VENOUS CATHETER PLACEMENT UNDER FLUOROSCOPY TECHNIQUE: The procedure, risks (including but not limited to bleeding, infection, organ damage ), benefits, and  alternatives were explained to the patient and spouse. Questions regarding the procedure were encouraged and answered. The spouse understands and consents to the procedure. Intravenous Fentanyl and Versed were administered as conscious sedation during continuous monitoring of the patient's level of consciousness and physiological / cardiorespiratory status by the radiology RN, with a total moderate sedation time of 12 minutes. The arterial limb of the graft was accessed antegrade with a 21-gauge micropuncture needle under real-time ultrasonic guidance after the overlying skin prepped with Betadine, draped in usual sterile fashion, infiltrated locally with 1% lidocaine. Needle exchanged over 018 guidewire for transitional dilator through which 4 mg t-PA was administered. Ultrasound imaging documentation was saved. Through the dilator, a Bentson wire was advanced to the venous anastomosis. Over this a 37F sheath was placed, through which a 5 [place] Kumpe catheter was advanced for outflow venography. This showed patency of the outflow venous system through the IVC. 3000 units heparin were administered. The Arrow PTD device was used to macerate thrombus in the graft with 2 passes. Gentle hand injections showed residual stenosis in the arterial limb near the apex of the graft. This was dilated using a 7 mm x 4 cm Conquest angioplasty balloon with good response. Antegrade contrast flow was noted with gentle hand injection. Attention was then turned to the venous limb for retrograde access. Patient became apneic with low heart rate, no palpable pulse. CPR was initiated. Sedation reversal agents were administered. Patient regained spontaneous breathing and palpable pulse. Blood pressure regained preprocedure value. The declot procedure was terminated, the sheath removed and hemostasis achieved there with a 3-0 Monocryl suture. The  patient's spouse was updated with the events. Attention was turned to placing a temporary  dialysis catheter. Patency of the right IJ vein was confirmed with ultrasound with image documentation. An appropriate skin site was determined. Skin site was marked. Region was prepped using maximum barrier technique including cap and mask, sterile gown, sterile gloves, large sterile sheet, and Chlorhexidine as cutaneous antisepsis. The region was infiltrated locally with 1% lidocaine. Under real-time ultrasound guidance, the right IJ vein was accessed with a 19 gauge needle; the needle tip within the vein was confirmed with ultrasound image documentation. The needle exchanged over a guidewire for vascular dilator which allowed advancement of a 20 cm Trialysis catheter. This was positioned with the tip at the cavoatrial junction. Spot chest radiograph shows good positioning and no pneumothorax. Catheter was flushed and sutured externally with 0-Prolene sutures. Patient tolerated the procedure well. FLUOROSCOPY TIME:  1.9 minutes, 140 uGym2 DAP COMPLICATIONS: COMPLICATIONS Apnea, requiring reversal of sedation agents. SIR level B: Nominal therapy (including overnight admission for observation), no consequence. IMPRESSION: 1. Incomplete declot of left thigh synthetic hemodialysis graft, due to sedation related complication. She can return for completion of this procedure electively. 2. Technically successful balloon angioplasty of intragraft stenosis. 3. Technically successful right IJ 20 cm trialysis hemodialysis catheter placement. ACCESS: Remains approachable for percutaneous intervention as needed. Electronically Signed   By: Corlis Leak M.D.   On: 11/20/2015 17:22    Microbiology: No results found for this or any previous visit (from the past 240 hour(s)).   Labs: Basic Metabolic Panel:  Recent Labs Lab 11/07/2015 1345 11/20/15 1725 11/21/15 0722 11/22/15 0530 11/12/2015 0258  NA 136 138 139 138 137  K 4.1 4.1 4.3 4.3 4.0  CL  --  97* 99* 100* 98*  CO2  --  24 25 24  21*  GLUCOSE 91 84 70 132* 139*   BUN  --  40* 45* 20 15  CREATININE  --  5.17* 5.51* 3.66* 2.63*  CALCIUM  --  8.0* 7.9* 8.1* 8.3*  PHOS  --  6.0*  --  4.5  --    Liver Function Tests:  Recent Labs Lab 11/20/15 1725 11/22/15 0530  ALBUMIN 1.7* 1.6*   No results for input(s): LIPASE, AMYLASE in the last 168 hours. No results for input(s): AMMONIA in the last 168 hours. CBC:  Recent Labs Lab 11/07/2015 1345 11/20/15 1745 11/21/15 0722 11/22/15 0530 11/18/2015 0258  WBC  --   --  23.7* 25.4* 23.9*  HGB 11.6* 8.0* 8.3* 8.3* 8.4*  HCT 34.0* 29.5* 28.7* 30.7* 30.9*  MCV  --   --  91.7 92.5 93.9  PLT  --   --  246 224 241   Cardiac Enzymes: No results for input(s): CKTOTAL, CKMB, CKMBINDEX, TROPONINI in the last 168 hours. BNP: BNP (last 3 results) No results for input(s): BNP in the last 8760 hours.  ProBNP (last 3 results) No results for input(s): PROBNP in the last 8760 hours.  CBG:  Recent Labs Lab 11/24/15 0824 11/24/15 1213 11/24/15 1701 11/25/15 0857 11/25/15 1151  GLUCAP 184* 162* 139* 170* 142*       Signed:  Eusevio Schriver MD.  [facility] 11/25/2015, 1:26 PM

## 2015-11-25 NOTE — Progress Notes (Signed)
I arrived to evaluate pateint, and she was in middle of conversation on phone, with husband Lanny Hurst holding the phone.  She was explaining thru the phone to someone what all had recently transpired with regard to her decline in health.  Instead of interrupting, I will return in next day or so to check on her. In interim, may continue to dress R UE as needed to capture drainage and may d/c drain at any time if bothersome to her.

## 2015-11-25 NOTE — Care Management Note (Signed)
Case Management Note  Patient Details  Name: [name] MRN: [id number] Date of Birth: October 01, 1965  Subjective/Objective:   Patient is for residential hospice per palliative consult, NCM informed CSW  Aware 3/27.                 Action/Plan:   Expected Discharge Date:                  Expected Discharge Plan:  [place]  In-House Referral:  Clinical Social Work  Discharge planning Services  CM Consult  Post Acute Care Choice:    Choice offered to:     DME Arranged:    DME Agency:     HH Arranged:    [place] Agency:     Status of Service:  Completed, signed off  Medicare Important Message Given:  Yes Date Medicare IM Given:    Medicare IM give by:    Date Additional Medicare IM Given:    Additional Medicare Important Message give by:     If discussed at [place] of Stay Meetings, dates discussed:    Additional Comments:  Zenon Mayo, RN 11/25/2015, 7:45 PM

## 2015-11-25 NOTE — Progress Notes (Signed)
Pt's husband has been tearful and upset. Pt's husband asked for patient to have dilaudid for pain but then when RN entered room with it he had changed his mind and pt seemed to be resting peacefully. When pt has been awake she has had poor mentation. She has been talking nonsense and making noises. Pt's husband has not wanted [place] to give medications and MD was notified that pt not alert enough to try oral medications at this time.

## 2015-11-25 NOTE — Care Management Important Message (Signed)
Important Message  Patient Details  Name: [name] MRN: [id number] Date of Birth: Nov 26, 1965   Medicare Important Message Given:  Yes    Loann Quill 11/25/2015, 12:54 PM

## 2015-11-25 NOTE — Progress Notes (Signed)
Patient ID: [name], female   DOB: 1966-07-22, 50 y.o.   MRN: [id number] Pt is resting comfortably with her husband Lanny Hurst at her bedside.  Plan is to get her home with hospice.  Will sign off. Please call with questions or concerns.

## 2015-11-30 NOTE — Progress Notes (Signed)
@  0100 Pt expired. Approximately 5 minutes prior, pt began to have episodes of bradycardia (HR 30s-40s) and long pauses. These episodes caused telemetry alarms that led to further assessment of pt indicating long periods of apnea interspersed with agonal breaths. Pt's husband was at bedside and informed that death could be imminent based upon pt's decline in vital signs. Empathy was provided and an offer was made to contact family and/or the chaplin, both of which were declined. When pt's rhythm became agonal and breathing was no longer apparent, myself and Dorene Grebe, RN both auscultated for heart sounds for 1 minute without success. Death was pronounced and empathy was again provided to pt. Pt endorsed he would like to see chaplain and subsequently chaplain was paged.  @0106  Raliegh Ip Schorr of [facility] paged regarding pt's expiration. Death certificate provided @ 0205 @0120  [facility] Contacted. Pt is not a candidate for any donations.

## 2015-11-30 NOTE — Care Management Note (Signed)
Case Management Note  Patient Details  Name: KAILI BIEBER MRN: [id number] Date of Birth: Oct 24, 1965  Subjective/Objective:     Expired.               Action/Plan:   Expected Discharge Date:                  Expected Discharge Plan:  Expired  In-House Referral:  Clinical Social Work  Discharge planning Services  CM Consult  Post Acute Care Choice:    Choice offered to:     DME Arranged:    DME Agency:     HH Arranged:    [place] Agency:     Status of Service:  Completed, signed off  Medicare Important Message Given:  Yes Date Medicare IM Given:    Medicare IM give by:    Date Additional Medicare IM Given:    Additional Medicare Important Message give by:     If discussed at [place] of Stay Meetings, dates discussed:    Additional Comments:  Zenon Mayo, RN 12-19-2015, 10:57 AM

## 2015-11-30 NOTE — Progress Notes (Signed)
Chaplain was paged to support husband of pt who just passed. Chaplain provided emotional and spiritual support and empathic listening. Pt's husband reflected on how they met and the wonderful friendship they had shared. They had met rather late in life and he clearly loved and appreciated her greatly. He asked me to pray with him and I did so. He said his parents were en route. I offered to stay until they arrived but he was ok with being alone until they came.

## 2015-11-30 DEATH — deceased

## 2015-12-30 NOTE — Discharge Summary (Signed)
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 Initial evaluation in the emergency department reveals sepsis likely related to wound infection on right middle finger. She reported seeing her orthopedic surgeon 4 days ago in the office which time they had plan to schedule surgery for amputation of first digit middle finger right hand. Over the weekend wound gradually became more swollen with increased erythema heat pain and malodorous drainage  Hospital Course:  Severe Sepsis secondary to multiple infected fingers -went to the OR 4 times with amputation of several digits and finally underwent Right hand transmetacarpal amputation, with digital fillet-flap closure, plans were to go back  to OR 3/25 Pt declined this and wanting more Palliative/comfort based Care -was on IV vanc and fortaz and Vanc stopped a week ago due to hearing loss -Seen by ID Dr.COmer and followed by Ortho Dr.Thompson . -seen by Palliative medicine team dr.Golding, decision, after ongoing discussions by myself, Dr.Schertz and dr.Golding, now decision made for Comfort care, stopped hemodialysis and Residential Hospice for End of life care, she expired on 11/27/2022 before she could be discharged to Residential Hospice.  Hypotension Chronic and is on midodrine, BP in 70s-80s -ACTH stim test normal -exacerbated in setting of infection/pain meds  Vulvovaginal candidiasis  Hemorrhoids/Rectal pain  Resp arrest -in setting of fentanyl and versed in IR on 3/22  End-stage renal disease on dialysis -HD stopped  Uncontrolled DM  Peripheral vascular disease  Hearing loss -pt attributes this to Vancomycin, ongoing for 6-7days    Time: 17min  Signed:  Lakin Romer  [facility] 12/05/2015, 4:21 PM

## 2016-03-05 DIAGNOSIS — I469 Cardiac arrest, cause unspecified: Secondary | ICD-10-CM | POA: Insufficient documentation
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
